# Patient Record
Sex: Male | Born: 1955 | Race: White | Hispanic: Yes | Smoking: Never smoker
Health system: Southern US, Community
[De-identification: ages and names within clinical notes are randomized; demographics above are authoritative.]

## PROBLEM LIST (undated history)

## (undated) DIAGNOSIS — F329 Major depressive disorder, single episode, unspecified: Secondary | ICD-10-CM

## (undated) DIAGNOSIS — K3184 Gastroparesis: Secondary | ICD-10-CM

## (undated) DIAGNOSIS — I739 Peripheral vascular disease, unspecified: Secondary | ICD-10-CM

## (undated) DIAGNOSIS — I10 Essential (primary) hypertension: Secondary | ICD-10-CM

## (undated) DIAGNOSIS — N183 Chronic kidney disease, stage 3 unspecified: Secondary | ICD-10-CM

## (undated) DIAGNOSIS — J45909 Unspecified asthma, uncomplicated: Secondary | ICD-10-CM

## (undated) DIAGNOSIS — H269 Unspecified cataract: Secondary | ICD-10-CM

## (undated) DIAGNOSIS — K219 Gastro-esophageal reflux disease without esophagitis: Secondary | ICD-10-CM

## (undated) DIAGNOSIS — D649 Anemia, unspecified: Secondary | ICD-10-CM

## (undated) DIAGNOSIS — I219 Acute myocardial infarction, unspecified: Secondary | ICD-10-CM

## (undated) DIAGNOSIS — M1990 Unspecified osteoarthritis, unspecified site: Secondary | ICD-10-CM

## (undated) DIAGNOSIS — N400 Enlarged prostate without lower urinary tract symptoms: Secondary | ICD-10-CM

## (undated) DIAGNOSIS — N2889 Other specified disorders of kidney and ureter: Secondary | ICD-10-CM

## (undated) DIAGNOSIS — F419 Anxiety disorder, unspecified: Secondary | ICD-10-CM

## (undated) DIAGNOSIS — G629 Polyneuropathy, unspecified: Secondary | ICD-10-CM

## (undated) DIAGNOSIS — E785 Hyperlipidemia, unspecified: Secondary | ICD-10-CM

## (undated) DIAGNOSIS — G4730 Sleep apnea, unspecified: Secondary | ICD-10-CM

## (undated) DIAGNOSIS — I2510 Atherosclerotic heart disease of native coronary artery without angina pectoris: Secondary | ICD-10-CM

## (undated) DIAGNOSIS — Z86010 Personal history of colonic polyps: Secondary | ICD-10-CM

## (undated) DIAGNOSIS — F32A Depression, unspecified: Secondary | ICD-10-CM

## (undated) DIAGNOSIS — E119 Type 2 diabetes mellitus without complications: Secondary | ICD-10-CM

## (undated) DIAGNOSIS — E1143 Type 2 diabetes mellitus with diabetic autonomic (poly)neuropathy: Secondary | ICD-10-CM

## (undated) HISTORY — DX: Gastroparesis: K31.84

## (undated) HISTORY — DX: Benign prostatic hyperplasia without lower urinary tract symptoms: N40.0

## (undated) HISTORY — DX: Unspecified cataract: H26.9

## (undated) HISTORY — DX: Anxiety disorder, unspecified: F41.9

## (undated) HISTORY — DX: Peripheral vascular disease, unspecified: I73.9

## (undated) HISTORY — DX: Unspecified asthma, uncomplicated: J45.909

## (undated) HISTORY — DX: Acute myocardial infarction, unspecified: I21.9

## (undated) HISTORY — DX: Hyperlipidemia, unspecified: E78.5

## (undated) HISTORY — DX: Polyneuropathy, unspecified: G62.9

## (undated) HISTORY — DX: Type 2 diabetes mellitus with diabetic autonomic (poly)neuropathy: E11.43

## (undated) HISTORY — PX: UPPER GASTROINTESTINAL ENDOSCOPY: SHX188

## (undated) HISTORY — PX: COLONOSCOPY: SHX174

## (undated) HISTORY — DX: Unspecified osteoarthritis, unspecified site: M19.90

## (undated) HISTORY — PX: ANKLE FRACTURE SURGERY: SHX122

## (undated) HISTORY — DX: Personal history of colonic polyps: Z86.010

## (undated) HISTORY — DX: Gastroparesis: E11.43

---

## 1986-03-27 DIAGNOSIS — E119 Type 2 diabetes mellitus without complications: Secondary | ICD-10-CM

## 1986-03-27 HISTORY — DX: Type 2 diabetes mellitus without complications: E11.9

## 1999-10-27 ENCOUNTER — Encounter: Payer: Self-pay | Admitting: Internal Medicine

## 1999-10-27 ENCOUNTER — Ambulatory Visit (HOSPITAL_COMMUNITY): Admission: RE | Admit: 1999-10-27 | Discharge: 1999-10-27 | Payer: Self-pay | Admitting: Internal Medicine

## 2000-09-22 ENCOUNTER — Emergency Department (HOSPITAL_COMMUNITY): Admission: EM | Admit: 2000-09-22 | Discharge: 2000-09-22 | Payer: Self-pay | Admitting: Internal Medicine

## 2000-09-24 ENCOUNTER — Emergency Department (HOSPITAL_COMMUNITY): Admission: EM | Admit: 2000-09-24 | Discharge: 2000-09-24 | Payer: Self-pay | Admitting: Emergency Medicine

## 2000-09-24 ENCOUNTER — Encounter: Payer: Self-pay | Admitting: Emergency Medicine

## 2001-11-12 ENCOUNTER — Encounter (HOSPITAL_COMMUNITY): Admission: RE | Admit: 2001-11-12 | Discharge: 2001-12-12 | Payer: Self-pay | Admitting: Unknown Physician Specialty

## 2001-12-13 ENCOUNTER — Encounter (HOSPITAL_COMMUNITY): Admission: RE | Admit: 2001-12-13 | Discharge: 2002-01-12 | Payer: Self-pay | Admitting: Unknown Physician Specialty

## 2002-01-13 ENCOUNTER — Encounter (HOSPITAL_COMMUNITY): Admission: RE | Admit: 2002-01-13 | Discharge: 2002-02-12 | Payer: Self-pay | Admitting: Unknown Physician Specialty

## 2002-02-12 ENCOUNTER — Encounter (HOSPITAL_COMMUNITY): Admission: RE | Admit: 2002-02-12 | Discharge: 2002-03-14 | Payer: Self-pay | Admitting: Unknown Physician Specialty

## 2002-03-17 ENCOUNTER — Encounter (HOSPITAL_COMMUNITY): Admission: RE | Admit: 2002-03-17 | Discharge: 2002-04-16 | Payer: Self-pay | Admitting: Unknown Physician Specialty

## 2002-05-12 ENCOUNTER — Inpatient Hospital Stay (HOSPITAL_COMMUNITY): Admission: RE | Admit: 2002-05-12 | Discharge: 2002-05-14 | Payer: Self-pay | Admitting: Orthopaedic Surgery

## 2002-06-19 ENCOUNTER — Encounter (HOSPITAL_COMMUNITY): Admission: RE | Admit: 2002-06-19 | Discharge: 2002-07-19 | Payer: Self-pay | Admitting: Orthopaedic Surgery

## 2002-06-26 ENCOUNTER — Emergency Department (HOSPITAL_COMMUNITY): Admission: EM | Admit: 2002-06-26 | Discharge: 2002-06-27 | Payer: Self-pay | Admitting: Emergency Medicine

## 2002-06-26 ENCOUNTER — Encounter: Payer: Self-pay | Admitting: Emergency Medicine

## 2002-07-21 ENCOUNTER — Encounter (HOSPITAL_COMMUNITY): Admission: RE | Admit: 2002-07-21 | Discharge: 2002-08-20 | Payer: Self-pay | Admitting: Orthopaedic Surgery

## 2002-08-22 ENCOUNTER — Encounter (HOSPITAL_COMMUNITY): Admission: RE | Admit: 2002-08-22 | Discharge: 2002-09-21 | Payer: Self-pay | Admitting: Orthopaedic Surgery

## 2002-09-22 ENCOUNTER — Encounter (HOSPITAL_COMMUNITY): Admission: RE | Admit: 2002-09-22 | Discharge: 2002-10-22 | Payer: Self-pay | Admitting: Orthopaedic Surgery

## 2002-10-24 ENCOUNTER — Encounter (HOSPITAL_COMMUNITY): Admission: RE | Admit: 2002-10-24 | Discharge: 2002-11-23 | Payer: Self-pay | Admitting: Internal Medicine

## 2003-01-01 ENCOUNTER — Ambulatory Visit (HOSPITAL_COMMUNITY): Admission: RE | Admit: 2003-01-01 | Discharge: 2003-01-01 | Payer: Self-pay | Admitting: Family Medicine

## 2003-01-01 ENCOUNTER — Inpatient Hospital Stay (HOSPITAL_COMMUNITY): Admission: AD | Admit: 2003-01-01 | Discharge: 2003-01-06 | Payer: Self-pay | Admitting: Family Medicine

## 2003-01-01 ENCOUNTER — Encounter: Payer: Self-pay | Admitting: Family Medicine

## 2003-01-02 ENCOUNTER — Encounter: Payer: Self-pay | Admitting: Family Medicine

## 2003-01-03 ENCOUNTER — Encounter: Payer: Self-pay | Admitting: Family Medicine

## 2003-03-28 HISTORY — PX: ESOPHAGOGASTRODUODENOSCOPY: SHX1529

## 2003-10-09 ENCOUNTER — Emergency Department (HOSPITAL_COMMUNITY): Admission: EM | Admit: 2003-10-09 | Discharge: 2003-10-09 | Payer: Self-pay | Admitting: Emergency Medicine

## 2003-10-14 ENCOUNTER — Emergency Department (HOSPITAL_COMMUNITY): Admission: EM | Admit: 2003-10-14 | Discharge: 2003-10-14 | Payer: Self-pay | Admitting: Emergency Medicine

## 2003-10-15 ENCOUNTER — Emergency Department (HOSPITAL_COMMUNITY): Admission: EM | Admit: 2003-10-15 | Discharge: 2003-10-15 | Payer: Self-pay | Admitting: Emergency Medicine

## 2003-10-16 ENCOUNTER — Inpatient Hospital Stay (HOSPITAL_COMMUNITY): Admission: AD | Admit: 2003-10-16 | Discharge: 2003-10-22 | Payer: Self-pay | Admitting: Gastroenterology

## 2003-10-17 ENCOUNTER — Encounter (INDEPENDENT_AMBULATORY_CARE_PROVIDER_SITE_OTHER): Payer: Self-pay | Admitting: Specialist

## 2003-10-17 ENCOUNTER — Encounter: Payer: Self-pay | Admitting: Internal Medicine

## 2003-10-26 ENCOUNTER — Encounter: Admission: RE | Admit: 2003-10-26 | Discharge: 2003-10-26 | Payer: Self-pay | Admitting: Family Medicine

## 2004-02-20 ENCOUNTER — Emergency Department (HOSPITAL_COMMUNITY): Admission: EM | Admit: 2004-02-20 | Discharge: 2004-02-20 | Payer: Self-pay | Admitting: Emergency Medicine

## 2004-11-17 ENCOUNTER — Emergency Department (HOSPITAL_COMMUNITY): Admission: EM | Admit: 2004-11-17 | Discharge: 2004-11-17 | Payer: Self-pay | Admitting: Emergency Medicine

## 2004-11-22 ENCOUNTER — Emergency Department (HOSPITAL_COMMUNITY): Admission: EM | Admit: 2004-11-22 | Discharge: 2004-11-22 | Payer: Self-pay | Admitting: Emergency Medicine

## 2004-11-23 ENCOUNTER — Ambulatory Visit (HOSPITAL_COMMUNITY): Admission: RE | Admit: 2004-11-23 | Discharge: 2004-11-23 | Payer: Self-pay | Admitting: Emergency Medicine

## 2004-12-16 ENCOUNTER — Emergency Department (HOSPITAL_COMMUNITY): Admission: EM | Admit: 2004-12-16 | Discharge: 2004-12-16 | Payer: Self-pay | Admitting: Emergency Medicine

## 2005-05-18 ENCOUNTER — Ambulatory Visit (HOSPITAL_COMMUNITY): Admission: RE | Admit: 2005-05-18 | Discharge: 2005-05-19 | Payer: Self-pay | Admitting: Ophthalmology

## 2005-10-12 ENCOUNTER — Ambulatory Visit (HOSPITAL_COMMUNITY): Admission: RE | Admit: 2005-10-12 | Discharge: 2005-10-13 | Payer: Self-pay | Admitting: Ophthalmology

## 2006-08-15 ENCOUNTER — Emergency Department (HOSPITAL_COMMUNITY): Admission: EM | Admit: 2006-08-15 | Discharge: 2006-08-15 | Payer: Self-pay | Admitting: Emergency Medicine

## 2006-08-16 ENCOUNTER — Ambulatory Visit (HOSPITAL_COMMUNITY): Admission: RE | Admit: 2006-08-16 | Discharge: 2006-08-16 | Payer: Self-pay | Admitting: Emergency Medicine

## 2006-08-29 ENCOUNTER — Encounter (HOSPITAL_COMMUNITY): Admission: RE | Admit: 2006-08-29 | Discharge: 2006-09-28 | Payer: Self-pay | Admitting: Family Medicine

## 2006-09-18 ENCOUNTER — Ambulatory Visit: Payer: Self-pay | Admitting: Internal Medicine

## 2006-10-04 ENCOUNTER — Ambulatory Visit: Payer: Self-pay | Admitting: *Deleted

## 2006-11-15 ENCOUNTER — Ambulatory Visit: Payer: Self-pay | Admitting: *Deleted

## 2006-11-15 ENCOUNTER — Encounter: Admission: RE | Admit: 2006-11-15 | Discharge: 2006-11-15 | Payer: Self-pay | Admitting: *Deleted

## 2007-01-19 ENCOUNTER — Emergency Department (HOSPITAL_COMMUNITY): Admission: EM | Admit: 2007-01-19 | Discharge: 2007-01-19 | Payer: Self-pay | Admitting: Emergency Medicine

## 2007-07-27 DIAGNOSIS — M129 Arthropathy, unspecified: Secondary | ICD-10-CM | POA: Insufficient documentation

## 2007-07-27 DIAGNOSIS — E1159 Type 2 diabetes mellitus with other circulatory complications: Secondary | ICD-10-CM | POA: Insufficient documentation

## 2007-07-27 DIAGNOSIS — E108 Type 1 diabetes mellitus with unspecified complications: Secondary | ICD-10-CM | POA: Insufficient documentation

## 2007-07-27 DIAGNOSIS — Z87442 Personal history of urinary calculi: Secondary | ICD-10-CM

## 2007-07-27 DIAGNOSIS — K219 Gastro-esophageal reflux disease without esophagitis: Secondary | ICD-10-CM

## 2007-07-27 DIAGNOSIS — I10 Essential (primary) hypertension: Secondary | ICD-10-CM

## 2007-11-02 ENCOUNTER — Inpatient Hospital Stay (HOSPITAL_COMMUNITY): Admission: EM | Admit: 2007-11-02 | Discharge: 2007-11-04 | Payer: Self-pay | Admitting: Emergency Medicine

## 2007-11-03 ENCOUNTER — Ambulatory Visit: Payer: Self-pay | Admitting: Surgery

## 2007-11-04 ENCOUNTER — Encounter (INDEPENDENT_AMBULATORY_CARE_PROVIDER_SITE_OTHER): Payer: Self-pay | Admitting: Internal Medicine

## 2007-12-03 ENCOUNTER — Encounter (HOSPITAL_COMMUNITY): Admission: RE | Admit: 2007-12-03 | Discharge: 2007-12-25 | Payer: Self-pay | Admitting: Neurosurgery

## 2007-12-26 ENCOUNTER — Ambulatory Visit: Admission: RE | Admit: 2007-12-26 | Discharge: 2007-12-26 | Payer: Self-pay | Admitting: Neurosurgery

## 2008-03-27 DIAGNOSIS — G4730 Sleep apnea, unspecified: Secondary | ICD-10-CM

## 2008-03-27 HISTORY — DX: Sleep apnea, unspecified: G47.30

## 2010-08-09 NOTE — Consult Note (Signed)
VASCULAR SURGERY CONSULTATION   Joseph [name], Joseph [name]  DOB:  November 05, 1955                                       10/04/2006  ZHYQM#:[id number]   CHIEF COMPLAINT:  Bilateral leg pain.   HISTORY:  Joseph [name] is a 55 year old, type 2 diabetic, who presents  complaining of bilateral leg pain.  This is more severe in his right  leg.  It occurs with ambulation present in his right ankle and calf and  to a lesser extent left calf.  It occurs after walking 1-2 blocks.  No  pain at rest.   He does have a history of complex right ankle fracture with chronic  pain, required open reduction of this approximately 5 years ago.  This  was a work related injury.   PAST MEDICAL HISTORY:  1. Type 2 diabetes.  2. Hypertension.  3. Gastroesophageal reflux disease.  4. Depression.   MEDICATIONS:  1. Glipizide 10 mg b.i.d.  2. Toprol XL 100 mg daily.  3. Cyclobenzaprine 10 mg b.i.d. t.i.d. p.r.n.  4. Promethazine 25 mg q.8 h.  5. Benicar HCT 40/25, one tablet daily.  6. Mirtazapine 15 mg, three tablets at bedtime.  7. Amlodipine.  8. Benazepril 5/20, one tablet daily.  9. Keflex 500 mg b.i.d.  10.Metformin HCL 1,000 mg b.i.d.  11.Aspirin 81 mg daily.  12.Protonix 40 mg b.i.d.  13.Metoclopramide 10 mg q.i.d.   ALLERGIES:  None known.   FAMILY HISTORY:  This is significant for diabetes in his father and  brother and sister.   SOCIAL HISTORY:  The patient is widowed with 4 children.  He does not  currently use alcohol or tobacco.   REVIEW OF SYSTEMS:  He notes weight loss and some anorexia.  He has a  chronic cough.  Chronic abdominal pain.  Chronic constipation.  Occasional dizziness and headaches.  Arthritic joint discomfort.  Depression and nervousness.   PHYSICAL EXAMINATION:  GENERAL:  A 55 year old Hispanic male who is in  no acute distress, moderate obesity.  VITAL SIGNS:  BP 133/89, pulse 82 per minute, respirations 18 per  minute.  NECK:  Supple.  No thyromegaly or  adenopathy.  CHEST:  Clear without rales or rhonchi.  CARDIOVASCULAR:  No carotid bruits.  Normal heart sounds without  murmurs.  ABDOMEN:  Soft and nontender.  No masses, organomegaly.  Normal bowel  sounds.  LOWER EXTREMITIES:  Femoral pulses 2+ bilaterally.  Popliteal pulses 1+  bilaterally.  Left dorsalis pedis 1+, absent right dorsalis pedis.  Absent right posterior tibial.  And absent left posterior tibial pulse.   INVESTIGATIONS:  Lower extremity arterial Doppler evaluation carried out  at [facility] reveals no evidence of hemodynamic significant  lower extremity arterial occlusive disease at rest.  However, vascular  calcification and noncompressive quality of the tibial vessels was  noted.   RECOMMENDATIONS:  The patient is referred for a CT angiogram of the  abdominal aorta with bilateral lower extremity runoff arteriography and  return to the office with results for further management.   Balinda Quails, M.D.  Electronically Signed  PGH/MEDQ  D:  10/04/2006  T:  10/05/2006  Job:  [id number]

## 2010-08-09 NOTE — Letter (Signed)
November 15, 2006   Tammy R. Collins Scotland, M.D.  [address], [place] [address]   Re:  KUTTER, SCHNEPF I              DOB:  1955-05-01   Dear Dr. Collins Scotland:   Mr. Rachel has undergone a CT angiogram of the abdomen with bilateral  lower extremity arteriography.  This fails to reveal any major arterial  occlusive disease.   I note in his initial consultation he did have normal lower extremity  Dopplers with triphasic wave forms throughout the lower extremities  bilaterally.   I have informed Mr. Puthoff of these findings.  I do not feel that he  requires any further workup for peripheral vascular disease at this  time.  I am sure that he does have some degree of tibial disease  associated with his diabetes.  He needs to remain off tobacco, and  continue medical management per Dr. Alda Berthold supervision.   Balinda Quails, M.D.  Electronically Signed   PGH/MEDQ  D:  11/15/2006  T:  11/17/2006  Job:  [id number]

## 2010-08-09 NOTE — Assessment & Plan Note (Signed)
Joseph [name]                         GASTROENTEROLOGY OFFICE NOTE   Joseph [name], Joseph [name]                       MRN:          [id number]  DATE:09/18/2006                            DOB:          1956/01/17    REFERRING PHYSICIAN:  Tammy R. Collins Scotland, M.D.   REASON FOR CONSULTATION:  Nausea and vomiting.   HISTORY:  This is a 55 year old Hispanic male, a history of  hypertension, diabetes mellitus and reflux disease, who was added onto  today's schedule, at the request of Dr. Alda Berthold office, regarding  problems with vomiting and weight loss.  The patient is a poor historian  and seems to have poor insight into his medical conditions.  He has been  seen in this office on one limited occasion.  That encounter occurred in  July of 2001, when he was referred for hematemesis.  At that time, upper  endoscopy was performed and found to be entirely normal.  He was felt to  possibly have reflux disease and was placed on a proton pump inhibitor.  He was seen one other time in July of 2005 as a hospital inpatient, when  he was admitted for nausea and vomiting, felt secondary to  gastroparesis.  Upper endoscopy was again performed and returned normal.  He was to follow up in the office but failed to do so, despite being  given an appointment.  He has not been seen since.  The patient's  diabetes has been under poor control.  His last hemoglobin A1c was 8.3.  He does have classic reflux symptoms including heartburn, regurgitation  and digestion.  Nausea and vomiting seem worse when the symptoms are  present.  He has had difficulty getting his medications at times, due to  no health insurance.  He has not worked.  He has recently secured  Medicaid, which has helped him afford his medications.  He also  complains of intermittent abdominal  discomfort, bloating and  constipation, which has not been worse.  When he is sick on his stomach,  he cannot eat and will lose  weight, though when feeling better is able  to regain his weight.  Since the patient has been able to secure  Medicaid, it appears that the patient has been on a daily proton pump  inhibitor, though he can not recall the name.  It also appears that he  has been on Reglan.He has felt some better since starting the GI  medications.   PAST MEDICAL HISTORY:  As above.   PAST SURGICAL HISTORY:  None.   CURRENT MEDICATIONS:  It is not certain, but we think the patient is on  Benicar 40 mg daily, Metformin 500 mg b.i.d., aspirin 81 mg daily,  Altace 10 mg daily, Toprol XL 100 mg daily, Accupril 10 mg daily,  Gabapentin 300 mg as directed, Glipizide 10 mg b.i.d., Promethazine 25  mg p.r.n., Remeron 30 mg at night and Reglan p.r.n.  Also, possible use  of Lantus insulin 20 units at night.   ALLERGIES:  NO KNOWN DRUG ALLERGIES.   FAMILY HISTORY:  Negative for gastrointestinal malignancy.  SOCIAL HISTORY:  The patient is widowed with 4 children.  He lives with  a friend.  He is unemployed.  He does not smoke or use alcohol.   REVIEW OF SYSTEMS:  Per diagnostic evaluation form.   PHYSICAL EXAMINATION:  GENERAL:  Well-appearing male in no acute  distress.  VITAL SIGNS:  Blood pressure is 138/82.  Heart rate is 88, weight is  177.8 pounds.  He is 5 foot 7 inches in height.  HEENT:  Sclerae anicteric.  Conjunctivae are pink.  Oral mucosa is  intact.  There is no oral thrush.  LUNGS:  Clear.  HEART:  Regular.  ABDOMEN:  Soft without tenderness, mass or hernia.  No succussion  splash.  Good bowel sounds heard.  EXTREMITIES:  Without edema.   IMPRESSION:  1. Recurrent problems with nausea and vomiting, due to a combination      of gastroesophageal reflux disease, poorly controlled diabetes      mellitus, and probable diabetic gastroparesis.  2. Multiple general medical problems with poor insight into his      medical issues. As well, his suboptimal involvement with his      medical  care.   RECOMMENDATIONS:  1. Daily proton pump inhibitor therapy. BID if once daily not      effective.  2. Strict control of diabetes mellitus.He will work with Dr. Collins Scotland.  3. Reglan 10 mg a.c. and h.s. if nausea and vomiting persists despite      compliance with the first two recommendations above.  4. Resume general medical care with Dr. Collins Scotland.     Wilhemina Bonito. Marina Goodell, MD  Electronically Signed    JNP/MedQ  DD: 09/20/2006  DT: 09/20/2006  Job #: [id number]   cc:   Tammy R. Collins Scotland, M.D.

## 2010-08-09 NOTE — Discharge Summary (Signed)
NAMEPRADYUN, Joseph [name]             ACCOUNT NO.:  [id number]   MEDICAL RECORD NO.:  [id number]          PATIENT TYPE:  INP   LOCATION:  4738                         FACILITY:  [facility]   PHYSICIAN:  Joseph I Elsaid, MD      DATE OF BIRTH:  08-Jun-1955   DATE OF ADMISSION:  11/02/2007  DATE OF DISCHARGE:  11/04/2007                               DISCHARGE SUMMARY   PRIMARY CARE PHYSICIAN:  Tammy R. Collins Scotland, M.D.   DISCHARGE DIAGNOSES:  1. Alcohol intoxication.  2. Syncope/seizure secondary to alcohol intoxication.  3. One episode of vomiting bloody fluid felt to be secondary to      alcoholic gastritis versus Mallory-Weiss tear.  4. Insulin-dependent diabetes mellitus.  5. Hypertension.  6. Gastroparesis.  7. Depression.  8. History of fatty liver.  9. The patient denies history of heavy alcohol abuse.  10.Hyponatremia secondary to alcohol, resolved.  11.History of anxiety and panic attack, resolved.  12.Acute renal failure, resolved.   DISCHARGE MEDICATIONS:  1. Insulin Lantus 35 units subcu at bedtime.  2. Insulin Humalog 70/30, 20 units subcu twice daily.  3. Benicar 1 tab daily.  4. To continue his dose of Lexapro and Klonopin.  5. Aspirin 81 mg daily.  6. Reglan 10 mg p.o. q.6 h. p.r.n.   CONSULTATION:  None.   PROCEDURE:  1. CT head, no acute intracranial abnormality and focal left frontal      cortical calcification, which was felt to be chronic.  2. Chest x-ray, cardiomegaly and central pulmonary vascular      prominence.   HISTORY OF PRESENT ILLNESS:  1. Please review the history done by Dr. Christella Noa.  This is a 55-year-      old male who was drinking heavily at a bar when he suddenly      collapsed.  As per his daughter, his eyes rolled back, and he      started twitching his lower and upper limb.  No incontinence or lip      biting noted.  The patient was somnolent and poorly arousable at      the emergency room and also he was vomiting and they noticed few  blood on the vomitus.  The patient admitted to the ICU for close      observation.  He was started on IV fluid within 24-hour complete      correction of his sodium level was done.  The patient maintained      his mentation, and the patient, whatever he knew, he was drinking      heavy alcohol at that time.  CT head was negative for any acute      event.  We felt that acute syncopal/seizure is related to alcohol,      and we did not press any further EEG.  The patient was advised if      he had recurrence of this episode, to return to the hospital or      contact his MD.  The patient was kept under monitor for 48 hours      with no further return of the  episode.  The patient retained his      complete conscious.  The patient was advised about quitting alcohol      and he denies heavy alcohol drinking.  He drinks occasionally and      heavily once every year.  2. Episode of vomiting of blood.  The patient's H&H remained stable      and was felt secondary to alcoholic gastritis versus Mallory-Weiss      tear.  No further episode happened and the patient tolerated      regular diet.  3. Uncontrolled diabetes mellitus with hemoglobin C1 found to be 11.3.      The patient to resume his home medication and to follow with his      primary care physician to adjust his medication further.  4. Hypertension.  The patient to continue his home medication.  5. Acute renal insufficiency, which improved.   DISPOSITION:  The patient will be discharged home.  Follow with his  primary care physician within a week.      Joseph Bosie Helper, MD  Electronically Signed     HIE/MEDQ  D:  11/04/2007  T:  11/04/2007  Job:  [id number]   cc:   Tammy R. Collins Scotland, M.D.

## 2010-08-09 NOTE — H&P (Signed)
Joseph [name], Joseph [name] NO.:  [id number]   MEDICAL RECORD NO.:  [id number]          PATIENT TYPE:  INP   LOCATION:  4738                         FACILITY:  [facility]   PHYSICIAN:  Herbie Saxon, MDDATE OF BIRTH:  November 19, 1955   DATE OF ADMISSION:  11/02/2007  DATE OF DISCHARGE:                              HISTORY & PHYSICAL   PRIMARY CARE PHYSICIAN:  Tammy R. Collins Scotland, M.D.   GASTROENTEROLOGIST:  Wilhemina Bonito. Marina Goodell, MD   PRESENTING COMPLAINT:  Passing out 10-15 minutes today, limb twitching  30 minutes.   HISTORY OF PRESENTING COMPLAINT:  This 55 year old Hispanic male was  drinking heavily at a party when it was suddenly noticed that he passed  out by the daughter with his eyes rolled backwards.  Subsequently, the  patient started having involuntary limb twitching lower and upper limbs.  No urine incontinence or lip biting noted.  EMS was subsequently called.  The patient was transferred to the emergency room at [facility].  The patient is somnolent, poorly arousable, noticed to be  still having some intermittent limb twitching and what seems like panic  attack, insisted on being taken home initially, but wanted to be taken  home by the daughters, extremely somnolent, but the daughters  volunteering further information.  The patient was previously healthy  prior to going to this party.  The patient has been noticed to be  vomiting bloody fluid while he was in the emergency room.  He was not  having this previously, but no melena noticed.   PAST MEDICAL HISTORY:  Insulin-dependant diabetes mellitus,  hypertension, gastroparesis, degenerative disk disease, heavy alcohol  abuse, depression, ureterolithiasis, fatty liver.   PAST SURGICAL HISTORY:  Fractured left leg status post ORIF.   FAMILY HISTORY:  Father and brother had diabetes.   SOCIAL HISTORY:  He is widowed, lives with his family, has 4 children,  history of binge alcohol drinking.  No history  of tobacco or drug abuse.   REVIEW OF SYSTEMS:  Not available presently because the patient is  obtunded.   ALLERGIES:  No known drug allergies.   MEDICATIONS:  1. Lantus insulin 35 units q.h.s.  2. Humalog coverage.  3. Benicar 1 tablet daily.  4. Percocet 1 tablet q.6 h. p.r.n. alternating with Vicodin.   PHYSICAL EXAMINATION:  GENERAL:  He is a middle-aged man, obese, not in  acute respiratory distress.  VITAL SIGNS:  Temperature 98.4, pulse 66, respiratory rate is 24, and  blood pressure 142/82.  The patient is not pale or jaundiced, very  somnolent, he is moving all limbs and responds to noxious stimuli.  Mucous membranes are moist.  Oropharynx and nasopharynx are clear.  NECK:  Supple.  No adenopathy or elevated JVD.  HEART:  Heart sounds 1 and 2, regular rate and rhythm.  No murmurs,  gallops, or rubs.  CHEST:  Clinically clear.  ABDOMEN:  Soft and nontender.  No organomegaly.  Inguinal orifices are  patent.  NEUROLOGIC:  The patient is arousable.  No seizure activity presently.  Cranial nerves and sensory system could not be tested.  Babinski  is  downgoing bilaterally.  Peripheral pulses present.  No pedal edema.   LABS:  WBC is 7, hematocrit 39, and platelet count is 295.  Urinalysis  negative for wbc, rare bacteria.  INR is 1.0, AST 24.  ETOH is 221.  Lipase 53.  Urine tox negative.  EKG showed no acute changes.  Chest x-  ray shows no acute changes.  Chemistry, sodium is 126, potassium 3.2,  chloride 89, bicarbonate 21, BUN 20, creatinine 1.52, and glucose is  277.   ASSESSMENT:  1. Altered mental status.  2. Gastrointestinal bleed.  3. Alcoholic intoxication.  4. Syncope versus seizure.  5. Hyponatremia.  6. Insulin-dependent diabetes, by labs is uncontrolled.  7. Degenerative joint disease.  8. Anxiety and panic attacks.  9. Mild acute renal failure.   The patient is to be admitted into Intensive Care Unit.  We will keep  him n.p.o. for now.  IV fluid  normal saline at 120 mL an hour.  We will  obtain his urine, sodium level, ammonia level, serum uric acid, serum  osmolality.  Check his hemoccult x3.  EEG to be scheduled in the  morning.  H&H q.12 h.  A 2-D echocardiogram and carotid Doppler.  Cardiac enzymes and EKG q.8.h x3.  We will check his calcium, magnesium,  and phosphate level.  When patient is more alert, counsel on alcoholic  abuse and Behavioral Health will be involved. .  Consult GI, neurology,  and psych evaluation in the morning.  We will check his BMP q.4 h. in  the next eight hours.  Type and cross 4 units of packed red blood cells  and transfuse 2 units of packed red blood cells with 40 mg IV Lasix, if  hematocrit drops below 30.  Ativan IV withdrawal protocol, Lopressor 2.5  mg IV q.6 h. p.r.n., SCD boots for DVT prophylaxis.  Protonix 40 mg IV  q.12 h. Phenergan 12.5 mg IV q.8 h.  Accu-Cheks q.4 h. and sensitive  scale NovoLog sliding scale coverage.  Activity, bed rest, fall,  aspiration, seizure, delirium tremens precautions fully characterizing  for once input and output monitoring.  O2 2 to 4 liters via nasal  cannula supplementally to keep saturations greater than 90.  Add  thiamine and folate in the first liter of IV fluid.      Herbie Saxon, MD  Electronically Signed     MIO/MEDQ  D:  11/02/2007  T:  11/04/2007  Job:  [id number]   cc:   Tammy R. Collins Scotland, M.D.

## 2010-08-12 NOTE — Op Note (Signed)
Joseph [name], Joseph [name]              ACCOUNT NO.:  [id number]   MEDICAL RECORD NO.:  [id number]          PATIENT TYPE:  OIB   LOCATION:  5735                         FACILITY:  [facility]   PHYSICIAN:  John D. Ashley Royalty, M.D. DATE OF BIRTH:  Oct 17, 1955   DATE OF PROCEDURE:  05/18/2005  DATE OF DISCHARGE:                                 OPERATIVE REPORT   ADMISSION DIAGNOSIS:  Proliferative diabetic retinopathy, posterior vitreous  membranes, traction detachment, vitreous hemorrhage, left eye.   PROCEDURES:  Pars plana vitrectomy with 25-gauge system, panretinal  photocoagulation, membrane peel, left eye.   SURGEON:  Alan Mulder, MD   ASSISTANT:  Rosalie Doctor, MA   ANESTHESIA:  General.   DETAILS:  Usual prep and drape, 25-gauge trocars at 10, 2 and 4 o'clock, 25-  gauge infusion at 4 o'clock, Provisc placed on the corneal surface.  The  pars plana vitrectomy was begun just behind the cataractous lens.  A large  curtain of red blood was encountered; this was carefully removed under low  suction and rapid cutting.  The vitreous was stretched down to a traction  area nasal to the disk, as well as 2 traction areas superior and temporal to  the macula.  These areas were trimmed and peeled with lighted pick and  removed with the vitreous cutter.  Blood was vacuumed from the macular  surface.  The vitrectomy was carried into the far periphery, where scleral  depression was used to remove all vitreous and blood down to the vitreous  base.  Once this was accomplished, the endolaser was positioned in the eye;  974 burns placed around the retinal periphery.  The power was 1000  milliwatts, 1000 microns each and 0.1 seconds each.  A washout and rinsing  of the vitreous cavity was then performed.  Once all blood was removed, the  instruments were removed from the eye.  The 25 trocars were removed.  The  wounds were held until tight.  0.1 mL of sterile air was injected to  reinflate the globe.   Closing tension was 10 with a Barraquer keratometer.  Polymyxin and gentamicin were irrigated into Tenon's space.  Atropine  solution was applied.  Marcaine was injected around the globe for postop  pain.  Decadron 10 mg were given to the subconjunctival space.  TobraDex  ophthalmic ointment, a patch and shield were placed.  Closing tension was 10  with a Barraquer keratometer.  Complications -- none.  Duration -- 1/2 hour.      Beulah Gandy. Ashley Royalty, M.D.  Electronically Signed     JDM/MEDQ  D:  05/18/2005  T:  05/19/2005  Job:  [id number]

## 2010-08-12 NOTE — Op Note (Signed)
   [name], Joseph [name]                          ACCOUNT NO.:  [id number]   MEDICAL RECORD NO.:  [id number]                  PATIENT TYPE:   LOCATION:  341                                  FACILITY:   PHYSICIAN:  Ky Barban, M.D.            DATE OF BIRTH:   DATE OF PROCEDURE:  DATE OF DISCHARGE:                                 OPERATIVE REPORT   PREOPERATIVE DIAGNOSES:  Right flank pain, gross hematuria, filling defect  right ureter.   POSTOPERATIVE DIAGNOSES:  No filling defect, basically medium and right  hydroureter with normal looking retrograde pyelogram essentially.   PROCEDURE:  Cystoscopy, right retrograde pyelogram. Insertion of double-J  stent, size 5 French 24 cm.   SURGEON:  Ky Barban, M.D.   ANESTHESIA:  Spinal   DESCRIPTION OF PROCEDURE:  The patient is placed in the lithotomy position  after routine prep and drape. A #25 cystoscope introduced into the bladder.  The bladder is inspected; it looks normal.  Right ureteral orifice  catheterized with a wedge catheter.  Hypaque is injected under fluoroscopic  control.  The dye goes up into the upper ureter.  There is a slightly  narrowing area on the ureter at the L3 level, but the dye goes through It.  The ureter is minimal dilated below and above this down to the  ureterovesical junction, but I do not see any filling defect. The renal  pelvis looks normal with normal calyces well cupped.   The ureter drains out nicely on drainage fill and basically it is normal;  but I decided to leave a double-J stent because he has had a lot of pain for  several days. A guidewire is passed up into the renal pelvis.  Over the  guidewire a 5 [place], 24 cm, double-J stent is positioned between the renal  pelvis and the bladder.   The double-J stent loops nicely in the bladder but in the renal pelvis there  is not a complete loop, because the upper tip is in the upper calyx; but, I  am sure, that I am perfectly  satisfied with it.  All the instruments were  removed.  The patient left the operating room in satisfactory condition.      ___________________________________________                                            Ky Barban, M.D.   MIJ/MEDQ  D:  01/03/2003  T:  01/03/2003  Job:  [id number]   cc:   Jeoffrey Massed, M.D.  [facility]  [place], [place] [address]  Fax: [phone]

## 2010-08-12 NOTE — Op Note (Signed)
NAMECORDON, GASSETT                          ACCOUNT NO.:  [id number]   MEDICAL RECORD NO.:  [id number]                   PATIENT TYPE:  INP   LOCATION:  5007                                 FACILITY:  [facility]   PHYSICIAN:  Mark C. Ophelia Charter, M.D.                 DATE OF BIRTH:  December 03, 1955   DATE OF PROCEDURE:  05/12/2002  DATE OF DISCHARGE:                                 OPERATIVE REPORT   PREOPERATIVE DIAGNOSIS:  Right ankle post-traumatic arthrosis.   POSTOPERATIVE DIAGNOSIS:  Right ankle post-traumatic arthrosis.   PROCEDURE:  Right distal fibular resection and ankle fusion with screw  fixation.   TOURNIQUET TIME:  An hour and half x350.   SURGEON:  Mark C. Ophelia Charter, M.D.   ASSISTANT:  Genene Churn. Denton Meek.   DRAINS:  None.   BRIEF HISTORY:  This 55 year old male had a medial malleolar fracture with a  probable syndesmotic rupture, had operative fixation done at an outside  hospital by another surgeon with open reduction and internal fixation of the  medial malleolus with x-rays demonstrating lateral position of the distal  fragment and lateral position of the talus.  He underwent removal of  hardware and excision of the fragment, but has had progressive arthrosis  present on plain radiographs with lateral subluxation of the fibula and  degenerative changes.   DESCRIPTION OF PROCEDURE:  After induction of general anesthesia,  preoperative Ancef prophylaxis, an IV bag wrapped with a towel placed  underneath the left buttocks, proximal thigh tourniquet, prepping with  DuraPrep, the usual extremity sheets/drapes were applied.  Stockinette was  cut and Betadine ViDrape was applied after marking the skin.  ViDrape sealed  the skin.  Large #9 glove was placed over the toes prior to application of  the ViDrape to isolate the toes.  Incision was made laterally following the  fibula starting 10 cm from its tip, and then at the distal aspect of the  fibula curving slightly anterior.   Subperiosteal dissection of the fibula  was performed, and the fibula was cut 6-8 cm proximal to the tip in oblique  fashion.  Syndesmotic ligaments were cut with a scalpel blade peeling from  the anterior and posterior aspect with care taken to not damage the peroneal  tendons or sural nerve.  Distal joint of the fibula and talus was  visualized.  Ankle joint with fibula was visualized.  Subperiosteal  dissection along the anterior distal surface of the tibia was performed, and  the capsule was elevated, and a Crego retractor was used to protect the  neurovascular bundle.  Posteriorly, this was elevated as well, and the wide  curved Crego was placed.  With structures protected anterior and posterior,  the ankle joint was placed in neutral position, and a saw blade was used to  remove the distal 2-3 mm of articular cartilage, making a direct transverse  cut across the  tibia, checking the saw cut under fluoroscopy to make that it  was perpendicular to the long axis of the tibia.  Medial malleolus fragment  had been excised, and using a laminar spreader, after the distal tibia piece  was removed, the medial malleolus remnant was still in place.  Ankle was  placed in neutral position, care taken not to invert or evert, and a cut was  made over the dome of the talus perpendicular to the long axis of the tibia  and parallel to the cut on the tibia.  This was checked under fluoroscopy  with slight varus, and the cut was adjusted so that it was exactly straight  with the tibial cut.  Pieces of bone were removed which was a 2-mm piece  removing all the articular cartilage.  Articular cartilage showed grade 4  chondromalacia with exposed subchondral bone.  Laminar spreader was  inserted, and the cartilage on the small remaining remnant of the medial  malleolus was scraped off the internal surface.  Under fluoroscopy, the  ankle was positioned in neutral position.  Small stab was placed medially   over the tibia spreading with a Mosquito.  A 0.032 drill was used followed  by placement of a screw which was 90 mm in length.  This held the ankle  tight; however, on a direct lateral x-ray, the screw was positioned slightly  posterior, and it entered and touched the subtalar joint.  This was pulled  out and without redrilling, an 80-mm screw was used and was positioned more  anterior, went across the ankle joint, did not violate the subtalar joint,  and held the ankle secure.  A second screw was the placed from the lateral  incision starting at the flare of the metaphysis and drilling obliquely  across the ankle joint into the talus.  This was a 40-mm screw, held the  ankle tight, secure, and there was no motion at the ankle joint.  It was  checked under AP and lateral fluoroscopy, and permanent photographs were  taken showing good position across the ankle joint, but not violating the  subtalar joint.  Fibula had been cut up into small tiny pieces, was packed  posteriorly, a few tiny piece anterior.  Excessive bone graft anterior would  have caused neurovascular compromise, and minimal bone was placed anterior.  The bone was packed lateral, and soft tissue was reapproximated with 2-0  Vicryl.  Peroneal nerves were in good position.  Skin was infiltrated with  Marcaine and closed with skin staples.  A large compressive dressing was  applied with Adaptic, 4 x 4's, Webril, Kling, giant cotton rolls, for a  compressive short leg splint, followed by Coban.  Instrument and needle  count was correct.  Tourniquet was deflated, small bleeders controlled with  the Bovie electrocautery prior to closure.  Tourniquet time was 1 hour and  30 minutes, and the patient was transferred to the recovery room in stable  condition.                                               Mark C. Ophelia Charter, M.D.    MCY/MEDQ  D:  05/12/2002  T:  05/12/2002  Job:  [id number]

## 2010-08-12 NOTE — H&P (Signed)
NAMEALBARAA, SWINGLE                          ACCOUNT NO.:  [id number]   MEDICAL RECORD NO.:  [id number]                   PATIENT TYPE:  INP   LOCATION:  5731                                 FACILITY:  [facility]   PHYSICIAN:  Malcolm T. Russella Dar, M.D. [facility]          DATE OF BIRTH:  01/18/1956   DATE OF ADMISSION:  10/16/2003  DATE OF DISCHARGE:                                HISTORY & PHYSICAL   CHIEF COMPLAINT:  Nausea and vomiting x1 month, cannot keep anything down.   HISTORY:  Vue is a 55 year old Hispanic male known to Dr. Suella Grove.  Moore's office with history of insulin-dependent diabetes mellitus,  hypertension, ureterolithiasis and depression.  He apparently has been  having some intermittent nausea and vomiting for a couple of years and has a  questionable prior diagnosis of gastroparesis.  He apparently had been seen  by Dr. Lionel December 4-5 years ago and had been tried on a trial of Reglan  which it is not clear whether or not it was beneficial.  We do not have any  of those records at the time of this dictation.  At this time, he has had  progressive symptoms over the past 1 month to the point of daily vomiting  and says that he is constantly nauseated, denies any heartburn or  indigestion.  He has been having upper abdominal pain and aching across the  upper abdomen.  He has not had any documented fever or chills; no diarrhea,  melena or hematochezia.  Over the past several days, he has been unable to  keep down anything including his medications and water.  He had been in the  emergency room on October 09, 2003 and again on October 15, 2003 and then left  [facility] ER yesterday and went to [facility] ER because he had not been seen by a  physician.  He was given IV fluids, antiemetics, etc, and then discharged  home.  He has had abdominal ultrasound this month which was normal with the  exception of fatty liver, CT scan of the abdomen and pelvis was  unremarkable, liver function studies  have been normal, albumin of 4,  hemoglobin of approximately 12, CBGs in the 250 range.  At this time, he is  unable to keep down even his medications over the past few days, was added  on to be seen in our office today and is admitted now for hydration, IV  antiemetics, IV Reglan, PPI and upper endoscopy.   Question refractory gastroparesis or possible gastric outlet obstruction.   CURRENT MEDICATIONS:  1. Benicar 40 mg daily.  2. Actos 45 mg daily.  3. Metformin 500 mg b.i.d.  4. Enteric-coated aspirin 81 mg daily.  5. Altace 5 mg 2 p.o. daily.  6. Nexium 40 mg daily.  7. Hydrochlorothiazide 25 mg daily.  8. Glyburide 5 mg b.i.d.  9. Zoloft 100 mg 1-1/2 tablets daily.  10.  Lantus insulin, which he is not currently taking.   ALLERGIES:  No known drug allergies.   PAST HISTORY:  Past history is pertinent for:  1. Hypertension.  2. Diabetes x15 years.  3. Ureterolithiasis.   PAST SURGICAL HISTORY:  He has not had any prior abdominal surgery.   FAMILY HISTORY:  The patient does have a family history of diabetes, 1  sister with a history of cancer, uncertain type.   SOCIAL HISTORY:  No tobacco.  No ETOH.  Not currently employed, living with  his girlfriend.   REVIEW OF SYSTEMS:  Review of systems pertinent for fatigue, some recent  dizziness over the past  few days, difficulty sleeping and depression.   PHYSICAL EXAMINATION:  GENERAL:  Well-developed Hispanic male in no acute  distress.  He is ill-appearing.  VITAL SIGNS:  Blood pressure 150/90, pulse is 86.  Weight is 182.  HEENT:  Nontraumatic, normocephalic.  EOMI.  PERLA.  Sclerae anicteric.  Buccal mucosa is dry.  CARDIOVASCULAR:  Regular rate and rhythm with S1 and S2.  He is slightly  tachycardic.  PULMONARY:  Clear to A&P.  ABDOMEN:  Abdomen is soft.  Bowel sounds are active.  He is tender across  the epigastrium.  There is no mass or hepatosplenomegaly.  RECTAL:  Heme-negative.  EXTREMITIES:  Extremities  without clubbing, cyanosis, or edema.   IMPRESSION:  1. [age]-year-old male with 43-month history of nausea and vomiting     which have been progressive and refractory, unable to keep down orals x4     days, rule out gastroparesis, rule out peptic ulcer disease with partial     gastric outlet obstruction.  2. Insulin-dependent diabetes mellitus.  3. Hypertension.  4. History of ureterolithiasis.  5. Depression.   PLAN:  The patient is admitted to the service of Dr. Judie Petit T. Russella Dar for IV  fluid hydration, baseline labs, antiemetics.  He will be started on IV  Reglan, IV Protonix, sliding-scale insulin coverage and plan EGD in a.m.  For details, please see the orders.      Amy Esterwood, P.A.-C. [facility]                Malcolm T. Russella Dar, M.D. [facility]    AE/MEDQ  D:  10/16/2003  T:  10/17/2003  Job:  [id number]   cc:   Ernestina Penna, M.D.  [address] [place]  [place] [address]  Fax: [phone]

## 2010-08-12 NOTE — H&P (Signed)
Joseph [name], Joseph [name]                          ACCOUNT NO.:  [id number]   MEDICAL RECORD NO.:  [id number]                   PATIENT TYPE:  INP   LOCATION:  A341                                 FACILITY:  [facility]   PHYSICIAN:  Jeoffrey Massed, M.D.             DATE OF BIRTH:  August 26, 1955   DATE OF ADMISSION:  01/01/2003  DATE OF DISCHARGE:                                HISTORY & PHYSICAL   CHIEF COMPLAINT:  Flank pain.   HISTORY OF PRESENT ILLNESS:  A 55 year old Hispanic male who presented to  the clinic today with about a 12- to 16-hour history of severe right flank  pain and gross hematuria.  He was feeling in his normal state of health  yesterday at work, and then when he got home from work he began to have the  pain.  He awoke again this morning with the pain and has been having nausea,  vomiting/retching all morning and in the office today.  He has been having  chills but no temperature taken, and it was normal in the office today.  No  diarrhea.  No abdominal pain.  No chest pain.  No shortness of breath.  No  prior history of flank pain similar to this.  No prior history of hematuria.   PAST MEDICAL HISTORY:  1. Diabetes type 2, noninsulin-dependent.  2. Hypertension.  3. Gastroesophageal reflux disease.  4. Normocytic anemia.  5. History of chronic abdominal pain (normal EGD, small bowel follow-     through, and barium enema in 2001).  6. Intermittent nausea, vomiting illnesses; believed to be diabetic     gastroparesis.   PAST SURGICAL HISTORY:  Open reduction and internal fixation of the right  ankle after an accident at work in 2002.   MEDICATIONS:  1. Actos 45 mg daily.  2. Benicar 40 mg daily.  3. Glucophage 500 mg p.o. b.i.d.  4. Glyburide 10 mg p.o. b.i.d.  5. Hydrochlorothiazide 25 mg p.o. q.i.d.  6. Aspirin 81 mg daily.   ALLERGIES:  No known drug allergies.   SOCIAL HISTORY/HABITS:  The patient has a fiancee who is in the room with  him now named  Gavin Pound.  He has two daughters.  He works at a [employer] here in town.  He denies use of tobacco, alcohol, or drugs.   FAMILY HISTORY:  No history of kidney stones.  Otherwise noncontributory.   REVIEW OF SYSTEMS:  As per HPI.  Also, chronic ear pain bilaterally.  No  headache, no muscle or joint pain presently.  Chronic slight lower extremity  edema bilaterally.  No constipation or diarrhea.  No skin rash.   PHYSICAL EXAM:  VITAL SIGNS:  Temperature 97.7, pulse 105, respirations 18,  blood pressure 133/83.  Weight 184 pounds.  GENERAL:  Alert, pleasant, hard of hearing, oriented to person, place, time,  and situation.  In no acute distress.  HEENT:  Both tympanic membranes dull but without bulging or loss of  landmarks.  Nasopharynx without congestion.  Oropharynx pink, with slightly  tachy mucous membranes without lesion or swelling.  NECK:  Supple.  Without lymphadenopathy or thyromegaly.  LUNGS:  Clear to auscultation bilaterally.  Breathing nonlabored.  CARDIOVASCULAR:  Regular rhythm, slight tachycardia.  No murmur, rub, or  gallop.  ABDOMEN:  Soft, nontender, nondistended.  Bowel sounds are normoactive.  There is tenderness in the right flank and right lower back area.  EXTREMITIES:  Show trace lower extremity edema bilaterally.  Normal  sensation to the feet and ankles.  No ulcerations seen.   LABORATORY WORK:  Urinalysis from clinic today showed large blood, 100  protein, no ketones, negative leukocyte esterase, and negative nitrite.  Complete metabolic panel:  Sodium 136, potassium 3.8, chloride 104,  bicarbonate 24, glucose 237, BUN 22, creatinine 1.5.  Total bilirubin 0.6,  alkaline phosphatase 39, AST 18, ALT 20, total protein 6.5, albumin 3.5,  calcium 9.  CBC showed a white blood cell count of 9.2, hemoglobin 12, and a  platelet count of 275,000; differential with 84% neutrophils and 10%  lymphocytes.  Uric acid level 5.9.   CT scan of the abdomen and  pelvis showed right perinephric edema and  hydronephrosis, but no stone was seen.  Appendix appeared normal.   ASSESSMENT AND PLAN:  1. Renal colic:  His flank pain and gross hematuria are most consistent with     this, although no stone was seen on CT scan.  However, the hydronephrosis     and perinephric edema are likely resultant from previous stone, and he     has likely passed it, although he does not remember seeing one.  Plan to     control pain with morphine in the hospital.  Will give antiemetics for     the nausea and vomiting associated with his renal colic.  Will rehydrate     with IV fluids.  Repeat chemistries in the morning and will send a urine     for further analysis and culture.  Will likely consult either nephrology     or urology while in the hospital.  2. Diabetes type 2:  Will use insulin sliding scale while in the hospital.     Will check hemoglobin A1C in the office as it has been since February     2004 since this was checked, and it was 8.9% at that time.  3. Hypertension:  The patient is dehydrated and his blood pressure is     normal.  Will hold antihypertensives for now.  4. Normocytic anemia:  He has carried this diagnosis in the past, and I will     look through the chart in the clinic to see what the workup has revealed.     ___________________________________________                                         Jeoffrey Massed, M.D.   PHM/MEDQ  D:  01/01/2003  T:  01/01/2003  Job:  [id number]   cc:   [facility]

## 2010-08-12 NOTE — Discharge Summary (Signed)
Joseph [name], Joseph [name]                          ACCOUNT NO.:  [id number]   MEDICAL RECORD NO.:  [id number]                   PATIENT TYPE:  INP   LOCATION:  A341                                 FACILITY:  [facility]   PHYSICIAN:  Jeoffrey Massed, M.D.             DATE OF BIRTH:  1955-05-21   DATE OF ADMISSION:  01/01/2003  DATE OF DISCHARGE:  01/06/2003                                 DISCHARGE SUMMARY   ADMISSION DIAGNOSES:  1. Renal colic.  2. Right hydronephrosis.  3. Intractable nausea/vomiting.  4. Dehydration.   DISCHARGE DIAGNOSES:  1. Renal colic.  2. Right hydronephrosis.  3. Intractable nausea/vomiting.  4. Dehydration.  5. Status post insertion of J-type stent in the right ureter.   DISCHARGE MEDICATIONS:  1. Levaquin 250 mg p.o. daily for six days.  2. Percocet 7.5 mg/325 mg one tablet q.6h. p.r.n.  3. Phenergan tablets/suppository 12.5 mg p.o. or PR q.6h. p.r.n.  4. Hydrochlorothiazide 25 mg p.o. daily.  5. Actos 45 mg p.o. daily.  6. Glucophage 500 mg p.o. b.i.d.  7. Glyburide 10 mg p.o. b.i.d.  8. Benicar 40 mg daily.   CHRONIC PROBLEM LIST:  1. Diabetes type 2, non-insulin-dependent.  2. Hypertension.  3. Gastroesophageal reflux disease.  4. Normocytic anemia.  5. History of chronic/intermittent abdominal pain.  6. History of diabetic gastroparesis.  7. Status post ORIF of right ankle.   CONSULTS:  Urology - Dr. Alleen Borne saw the patient on January 02, 2003.   PROCEDURES:  1. CT scan - noncontrast on January 01, 2003.  This showed perinephric edema     and right hydronephrosis without a stone seen.  Normal-appearing appendix     for a noncontrast study.  2. IVP and CT of abdomen and pelvis on January 02, 2003.  Showed a filling     defect in the right ureter consistent with clot.  Showed slight     improvement in perinephric edema and right hydroureter.  Again, no stone     seen.  3. Stent placement to right ureter.  Retrograde cystogram was  performed by     Dr. Jerre Simon.  He did not see the filling defect previously seen on the     IVP.  He placed a J-type stent in the right ureter.  He saw no stones.     Urine cytology was done at this time and the results favored an     inflammatory-type reaction without any significant signs of malignancy.   HISTORY AND PHYSICAL:  For complete H&P please see H&P in chart.  Briefly,  this was a 55 year old Hispanic male without any history of kidney stones  who was admitted with acute renal colic and gross hematuria.  Screening CAT  scan of the abdomen and pelvis did not show a stone but showed perinephric  edema and right hydroureter.  This was consistent with the appearance of  having passed  a stone, but the patient continued to have significant pain  and nausea and vomiting.  He was afebrile with stable vital signs and he was  admitted for pain control, control of nausea and vomiting, and IV fluid  hydration.  He was also admitted for further workup to see the etiology of  his pain.   HOSPITAL COURSE BY PROBLEM:  #1 - RENAL COLIC.  The patient was admitted and  immediately placed on IV pain medication and IV fluids.  I consulted Dr.  Jerre Simon, a urologist, over the phone and discussed the case with him.  His  impression was that this was likely a passed stone and that if he continued  to have pain overnight then he would do a formal consult and further  studies.  The patient did continue to have significant pain overnight and  Dr. Jerre Simon saw the patient the next day.  In the interim an IVP was  performed and showed a small filling defect in the right ureter consistent  with a blood clot.  No stone was seen on this study.  The patient continued  to require IV pain medicine and continued to have some gross hematuria.  He  then underwent cystoscopy with cystogram and placement of the J-type stent  as dictated in the procedures section of this dictation.  The patient's pain  gradually lessened  but he did still have some significant pain requiring  oral narcotic pain medication on discharge.  No stones were found during  this admission, but the clinical picture of acute onset of renal colic,  gross hematuria, and the findings of anatomy consistent with obstruction  support the diagnosis of a passed stone.  Again, cytology did not show any  sign of malignancy.  The patient was discharged home on oral narcotic pain  medication and has follow-up with the urologist in approximately one week.   #2 - NAUSEA, VOMITING, AND DEHYDRATION.  The patient was unable to take any  pain medicine or antiemetics by mouth on the day of admission.  He was  started on IV fluids and given antiemetics and his vomiting ceased.  He did  have a poor appetite during the hospitalization but no further vomiting.  He  did complain of some intermittent nausea.  It was believed this was  secondary to the severe pain associated with his urinary obstruction.   #3 - DIABETES TYPE 2.  The patient was admitted to the hospital and placed  on sliding scale insulin and CBGs were obtained q.6h. while not eating.  The  CBGs were in good control throughout the hospitalization until the last day  when he began eating more of a regular diabetic diet.  On discharge he was  instructed to resume his home diabetic medication as previously dictated.   DISPOSITION:  The patient was discharged home in improved condition on the  previously-mentioned discharge medications.  He was instructed to hold his  aspirin until his hematuria had totally resolved.  A follow-up was made  with Dr. Jerre Simon.  He was instructed to gradually increase his activity as  tolerated and he was given a note to excuse him from work until January 12, 2003.  He was instructed to gradually increase his diet as tolerated and to  resume his regular diabetic diet.     ___________________________________________  Jeoffrey Massed, M.D.   PHM/MEDQ  D:  01/07/2003  T:  01/07/2003  Job:  [id number]   cc:   Ky Barban, M.D.  [address]  [place]  [place] [address]  Fax: [phone]

## 2010-08-12 NOTE — Consult Note (Signed)
Joseph [name], READING                          ACCOUNT NO.:  [id number]   MEDICAL RECORD NO.:  [id number]                   PATIENT TYPE:  INP   LOCATION:  A341                                 FACILITY:  [facility]   PHYSICIAN:  Ky Barban, M.D.            DATE OF BIRTH:  1955-09-06   DATE OF CONSULTATION:  01/02/2003  DATE OF DISCHARGE:                                   CONSULTATION   CHIEF COMPLAINT:  Right flank pain.   HISTORY:  This 55 year old gentleman was admitted by Dr. Milinda Cave, yesterday,  with gross total hematuria and severe right flank pain with clinical  diagnosis of renal colic and maybe he is passing a stone.  A screening CT  was done. It did not show any stone, but it does show perinephric stranding  on the right side.  Also there was some hydronephrosis, but no definite  stone in the ureter.  During the night the patient continued to have pain;  so I was asked by Dr. Milinda Cave to see the patient.  He is still having pain.  No voiding difficulties, fever or chills. He denies having any history of  kidney stones in the past.  Does give a history of having nausea and  vomiting.   PAST MEDICAL HISTORY:  1. Type 2 diabetes  2. Hypertension  3. GE reflux  4. Anemia  5. Intermittent nausea and vomiting and is believed to be diabetic     gastroparesis.  6. History of open reduction and internal fixation of right ankle after an     accident at work in 2002.   MEDICATIONS:  1. Actos  2. Benecol  3. Glucophage  4. Glyburide  5. Hydrochlorothiazide  6. Aspirin.   ALLERGIES:  NO KNOWN DRUG ALLERGIES.   PHYSICAL EXAMINATION:  GENERAL:  On examination a moderately built male, who  is in moderate distress; fully conscious; alert; and oriented.  VITAL SIGNS:  Blood pressure 120/80, temperature is normal.  ABDOMEN:  Slightly full, but not distended.  There is 1+ right CVA  tenderness.  No masses palpable.  GENITALIA:  External genitalia are unremarkable.   LABORATORY DATA:  Urinalysis done by Dr. Milinda Cave, in his office, shows a  large amount of blood, no ketones, and negative leukocyte esterase, negative  nitrite.   His Metabolic-7, today, sodium 138, potassium 3.4, chloride 105, CO2 28,  glucose 143, BUN 15, creatinine 1.2, calcium 8.2.  WBC count 7.2, hematocrit  32.8.   IMPRESSION:  Right renal colic.  The patient had an IVP and CT scan done  again. It showed the hydronephrosis had lessened somewhat, continued to have  perinephric stenting which is understandable because it just started  yesterday.  Right mid-ureter that has a filling defect, looks like a blood  clot.  It would be papillary necrosis, but there is no stone.  My feeling is  that we need to treat him conservatively  with pain medicine.  Hopefully he  will pass this filling defect which is most likely a blood clot. But, if the  pain gets worse or if he becomes septic will consider doing a retrograde  pyelogram, double-J stent with urethroscopy.  I have discussed this with the  patient, he understands and will follow him. Will also order urine  cytologies.   I appreciate Dr. Milinda Cave for letting me see his patient.      ___________________________________________                                            Ky Barban, M.D.   MIJ/MEDQ  D:  01/02/2003  T:  01/02/2003  Job:  [id number]   cc:   Jeoffrey Massed, M.D.  [facility]  [place], [place] [address]  Fax: [phone]

## 2010-08-12 NOTE — Discharge Summary (Signed)
Joseph [name], HEINECKE NO.:  [id number]   MEDICAL RECORD NO.:  [id number]                   PATIENT TYPE:  INP   LOCATION:  5731                                 FACILITY:  [facility]   PHYSICIAN:  Lina Sar, M.D. [facility]               DATE OF BIRTH:  06/06/1955   DATE OF ADMISSION:  10/16/2003  DATE OF DISCHARGE:  10/22/2003                                 DISCHARGE SUMMARY   ADMISSION DIAGNOSES:  1. A four to six-week history of nausea and vomiting with four-day history     of inability to keep down p.o.'s.  Rule out diabetic gastroparesis; rule     out peptic ulcer disease; rule out gastric outlet obstruction.  2. Insulin-requiring diabetes mellitus, type 2, with 15-year history of     diabetes.  3. Hypertension.  4. History of ureterolithiasis.  5. Fatty liver.  6. Depression.  7. History of surgery following fracture of the right ankle.   DISCHARGE DIAGNOSES:  1. Nausea and vomiting with abdominal pain, etiology felt to be     gastroparesis, improved with medication adjustment.  2. Hypokalemia, corrected.  3. Hypertension, improved.  Blood pressure controlled with addition of new     beta blocker.  4. Microalbuminuria.  5. Diabetes mellitus, type 2.  Hemoglobin A1C indicates inadequate long-term     control.  6. Depression.  Note:  Medications were changed in an effort to minimize     gastrointestinal side effects during this admission.  Depression and     related functional gastrointestinal complaints may be contributing to     patient's symptomatology.  7. Blue discoloration of distal esophagus on upper endoscopy.  Biopsies are     benign.   CONSULTATIONS:  Family practice teaching service, Asencion Partridge, M.D   PROCEDURES:  Upper endoscopy by Dr. Stan Head on October 17, 2003.  Mucosa  was normal with the exception of blue discoloration in the distal esophagus.  Biopsies of the area reveal benign squamous mucosa.  Discoloration felt  secondary  to retention of a pill.   BRIEF HISTORY:  Joseph [name] is a 55 year old Hispanic male referred by Dr.  Kathi Der office to [facility].  He has been seen in the past by Dr. Yancey Flemings, though it is unclear if he has undergone any studies by Dr. Marina Goodell in  the past.  He had also been seen by Dr. Karilyn Cota about four to five years ago.  He had a history for at least a couple of years of intermittent nausea and  vomiting which was attributed to diabetic gastroparesis.  A trial of Reglan  had been used in the past, but it is no clear whether or not it was of any  benefit.  For the past four to six weeks, the patient had developed more  persistent symptoms of nausea and vomiting as well as epigastric area pain.  The symptoms  had worsened over the course of the month to the point where he  was vomiting daily, and for the three or four days prior to being seen by  Dr. Russella Dar in the office, he had been unable to keep much of anything down.  He had gone to [facility] ER on July 15 and July 21, but he left the ER on July 21  because it took so long to see a physician and proceeded to [facility] ER.  He  received IV fluids, antiemetics, and was discharged to home.  Studies  earlier in the month included an ultrasound which was remarkable only for a  fatty liver.  A CT scan also performed recently had shown abnormal abdominal  and pelvic views.  LFTs were normal.  Albumin was 4.  Hemoglobin was 12.  CBGs were in the 250 range.  He was referred to see a doctor at [facility]  and saw Dr. Melene Muller of 22 July and was admitted from the office for  symptomatic control and further diagnostic investigation.   LABORATORY AND X-RAY DATA:  Hemoglobin 11.5, hematocrit 34.2, white blood  cell count 6.4, MCV 82.4, platelets 279,000.  Sodium at one point was 132;  it was 135 at discharge.  Potassium low of 3.2; it was 3.3 at discharge.  Glucose ranged anywhere from 147 up to 199.  BUN was 6, creatinine 1.3.  Albumin 3.7.  Total bilirubin  1, alkaline phosphatase 39, SGOT 23, SGPT 19.  Albumin was 3.7.  Glycosylated hemoglobin was 8.3.  TSH was 0.624.   A two-view of the abdomen showed an unremarkable bowel gas pattern with no  evidence for obstruction, ileus, or free air.   HOSPITAL COURSE:  #1.  DIABETIC GASTROPARESIS:  The patient was admitted to non-telemetry bed  and treated with clear liquid diet, normal saline, IV fluid hydration, IV  Reglan and Protonix as well as IV Phenergan p.r.n.  He was continued on  essential oral medications, but oral glucose-control agents were held.  On  hospital day #2, the patient underwent upper endoscopy.  The blue  discoloration of distal esophagus has been mentioned, and this was  attributed to a retained pill that had passed and had left behind some dye.  No mucosal source for his symptoms was found.  His symptoms continued.  He  was having additional increase in epigastric pain.  His Reglan was increased  from 10 mg every 6 hours to 20 mg every 6 hours, and morphine was added  p.r.n.  Zofran was later added.  Zelnorm was added.  The patient did not  have any stools for first days of hospitalization.   After discussion with the patient, it seems that the acute exacerbation of  what had previously been intermittent symptoms of nausea and vomiting had  occurred when he was started on Zelnorm.  We made the switch from Zelnorm to  Lexapro in hopes that we could alleviate possible GI side effects of the  Zelnorm.  The change of medication was made on 25 July.  By 26 July, the  patient's nausea had decreased.  He had one loose stool and less abdominal  discomfort.   Ultimately we were able to advance the patient's diet to a low-reside diet  which he tolerated.  Abdominal exam was improved with less tenderness.  Zelnorm was discontinued, and he was to be sent home with prescription for Reglan, Protonix, and the Lexapro in addition to some other new medications  to be mentioned.   #  2.   DIABETES:  Diabetes was not optimally controlled during this admission.  We did treat the patient with sliding scale insulin and held many of his  oral agents while were limiting his p.o. intake.  His hemoglobin A1C was  high.  The family practice service along with the diabetes coordinating RN  assisted [place] with treating the elevated sugars.  New strategies for managing  the diabetes include regular use of Lantus insulin of 4 units at bedtime.  He had previously used Lantus but was not using it on a regular basis and  had not been using it in the several weeks prior to coming to the hospital.   Note that the patient states that he has never gone to a formal diabetic  education class and, thus, has not a lot of insight into how to control his  blood sugars.  He needs to have referral from his primary MD to a diabetic  education course which he should attend with his wife in the near future.   #3.  DEPRESSION:  It was obvious from speaking with the patient and  observing him that he is depressed.  He had been started on Zoloft, and this  again was felt possibly to be a source for exacerbation of gastroparesis.  Therefore, we switched him over to Lexapro.  If it was this change in  medications or other medication adjustments that resulted in his clinical  improvement, it is hard to determine; however, we continued him on Lexapro  at discharge.   #4.  HYPOKALEMIA:  Initially, the patient's potassium was normal.  However,  with the use of non-potassium-containing IV fluids, his potassium dropped.  Potassium was added to his IV fluids and was correcting and at near normal  levels at discharge.   #5.  HYPERTENSION:  The patient has history of hypertension and, on several  readings, he had systolic blood pressure readings up into the high 100s and  low 200s.  Diastolic blood pressure reached up as high as 114 and often  above 80.  Some of these high readings may have been secondary to patient   being in some abdominal discomfort.  The family medicine service added  Lopressor to his medical regimen, and his blood pressure readings adjusted  to a normotensive level nicely.  He was given a prescription for this new  Lopressor at discharge.   CONDITION ON DISCHARGE:  Improved and stable.   FOLLOW UP:  Office visit with Dr. Claudette Head on August 17 and with Dr.  Ian Bushman at [facility] on August 3.   DISCHARGE MEDICATIONS:  1. Altace 10 mg daily.  2. Hydrochlorothiazide 25 mg daily.  3. Avapro 300 mg daily.  4. Lexapro 10 mg daily.  5. Lopressor 25 mg twice daily.  6. Nexium 40 mg daily.  7. Reglan 10 mg 20 minutes before meals.  He can take Reglan at bedtime if     needed for overnight nausea and vomiting; so the bedtime dose is     optional.  8. Metformin 500 mg twice daily.  9. Coated aspirin 81 mg, to be restarted on August 1. 10.      Glyburide 5 mg twice daily.  11.      Benicar 40 mg daily.  12.      Actos 45 mg daily.  13.      Lantus insulin 4 units subcutaneously at bedtime.  14.      Tylenol p.r.n.  DISCHARGE INSTRUCTIONS:  1. Diet at discharge, he is to follow a tightly controlled diabetic diet and     avoid high-fiber foods.  A low-fat diet     was stressed as part of the diabetic diet.  2. He was to call [facility] if he had any further problems.  3. He was also to request referral to a diabetic teaching class at the     appointment with Dr. Ian Bushman.      Jennye Moccasin, P.A. [facility]                   Lina Sar, M.D. [facility]    SG/MEDQ  D:  11/11/2003  T:  11/11/2003  Job:  [id number]   cc:   Ernestina Penna, M.D.  [address] [place]  [place] [address]  Fax: [phone]   Dr. Angelene Giovanni [facility]   Wilhemina Bonito. Marina Goodell, M.D. [facility]

## 2010-08-12 NOTE — Op Note (Signed)
NAMEELMUS, MATHES              ACCOUNT NO.:  [id number]   MEDICAL RECORD NO.:  [id number]          PATIENT TYPE:  OIB   LOCATION:  5705                         FACILITY:  [facility]   PHYSICIAN:  Beulah Gandy. Ashley Royalty, M.D. DATE OF BIRTH:  July 06, 1955   DATE OF PROCEDURE:  10/12/2005  DATE OF DISCHARGE:                                 OPERATIVE REPORT   PREOPERATIVE DIAGNOSIS:  Proliferative diabetic retinopathy, traction  retinal detachment, vitreous hemorrhage right eye.   PROCEDURES:  Pars plana vitrectomy with membrane peel, 25 gauge system, gas  fluid exchange, retinal photocoagulation in the right eye.   SURGEON:  Alan Mulder, M.D.   ASSISTANT:  Shana Chute R.N.   ANESTHESIA:  General.   DETAILS:  Usual prep and drape.  25 gauge trocars at 8, 10, 2 o'clock.  The  infusion port placed at 8 o'clock. The lighted pick and the cutter placed at  10 and 2 o'clock respectively.  Provisc placed on the corneal surface.  Biome viewing system moved into place.  Pars plana vitrectomy was begun just  behind the crystalline lens.  Blood and vitreous was encountered.  This was  carefully removed under low suction and rapid cutting. Traction was seen on  the lower arcade.  This traction was removed with the vitreous forceps and  the vitreous cutter. Endo cautery was also used. An additional cutting and  was performed with the 25-gauge cutter.  The vitrectomy was carried out to  the peripheral vitreous base and vitreous and vitreous strands and blood  were encountered here. Scleral depression was used to gain access to this  area and trimmed the vitreous base.  All blood was removed.  The Endo  cautery was placed in the eye and cautery points near the macula were  treated. The endolaser was positioned in the eye, 1337 burns were placed  around the retinal periphery with a power 1000 milliwatts, 1000 microns each  and 0.05 seconds each.  A washout procedure was performed and then 30% gas  fluid  exchange was performed.  The instruments were removed from the eye.  The 25 gauge trocars were removed. Wounds were tested and found to be tight.  Polymyxin and gentamicin were irrigated into tenons space.  Marcaine was  injected around the globe for postop pain.  TobraDex ophthalmic ointment was  placed, Decadron 10 mg was injected into the lower subconjunctival space.  Closing pressure was 15 with a Barraquer keratometer.  Complications none.  Duration 1/2 hour.  The patient is awakened and taken to recovery in  satisfactory condition.      Beulah Gandy. Ashley Royalty, M.D.  Electronically Signed     JDM/MEDQ  D:  10/12/2005  T:  10/13/2005  Job:  [id number]

## 2010-12-23 LAB — URINALYSIS, ROUTINE W REFLEX MICROSCOPIC
Protein, ur: NEGATIVE
Urobilinogen, UA: 0.2
pH: 6

## 2010-12-23 LAB — HEMOGLOBIN AND HEMATOCRIT, BLOOD
HCT: 37 — ABNORMAL LOW
HCT: 37.2 — ABNORMAL LOW
HCT: 37.5 — ABNORMAL LOW
Hemoglobin: 12.6 — ABNORMAL LOW

## 2010-12-23 LAB — CBC
HCT: 34.2 — ABNORMAL LOW
HCT: 39.2
Hemoglobin: 11.5 — ABNORMAL LOW
Hemoglobin: 12.8 — ABNORMAL LOW
MCHC: 33
MCHC: 34.4
MCV: 81.6
MCV: 83.5
MCV: 83.7
Platelets: 241
Platelets: 295
RBC: 4.08 — ABNORMAL LOW
RBC: 4.8
RDW: 12.9
WBC: 7.3

## 2010-12-23 LAB — COMPREHENSIVE METABOLIC PANEL
ALT: 20
ALT: 20
Albumin: 3 — ABNORMAL LOW
Albumin: 3.8
Alkaline Phosphatase: 47
BUN: 10
BUN: 20
CO2: 20
CO2: 21
CO2: 25
Calcium: 8.6
Chloride: 104
Creatinine, Ser: 1.24
Creatinine, Ser: 1.31
Creatinine, Ser: 1.52 — ABNORMAL HIGH
GFR calc Af Amer: 59 — ABNORMAL LOW
GFR calc Af Amer: >60
GFR calc non Af Amer: 48 — ABNORMAL LOW
GFR calc non Af Amer: 57 — ABNORMAL LOW
GFR calc non Af Amer: >60
Glucose, Bld: 180 — ABNORMAL HIGH
Potassium: 3.9
Sodium: 134 — ABNORMAL LOW
Total Bilirubin: 0.7
Total Protein: 6.6
Total Protein: 7

## 2010-12-23 LAB — CROSSMATCH: ABO/RH(D): O POS

## 2010-12-23 LAB — CARDIAC PANEL(CRET KIN+CKTOT+MB+TROPI)
CK, MB: 1.8
Relative Index: INVALID
Total CK: 79
Troponin I: 0.01

## 2010-12-23 LAB — PROTIME-INR
INR: 1
Prothrombin Time: 12.8

## 2010-12-23 LAB — BASIC METABOLIC PANEL
CO2: 22
Chloride: 103
Creatinine, Ser: 1.22
GFR calc Af Amer: >60
Potassium: 4.2

## 2010-12-23 LAB — DIFFERENTIAL
Basophils Absolute: 0
Basophils Relative: 0
Eosinophils Absolute: 0.1
Monocytes Absolute: 0.4
Neutro Abs: 5
Neutrophils Relative %: 71

## 2010-12-23 LAB — POCT CARDIAC MARKERS: Troponin i, poc: <0.05

## 2010-12-23 LAB — RAPID URINE DRUG SCREEN, HOSP PERFORMED
Barbiturates: NOT DETECTED
Cocaine: NOT DETECTED
Tetrahydrocannabinol: NOT DETECTED

## 2010-12-23 LAB — KETONES, QUALITATIVE: Acetone, Bld: NEGATIVE

## 2010-12-23 LAB — URINE MICROSCOPIC-ADD ON

## 2010-12-23 LAB — HEMOGLOBIN A1C
Hgb A1c MFr Bld: 11.3 — ABNORMAL HIGH
Mean Plasma Glucose: 325

## 2010-12-23 LAB — ETHANOL: Alcohol, Ethyl (B): 221 — ABNORMAL HIGH

## 2010-12-23 LAB — CK TOTAL AND CKMB (NOT AT ARMC)
CK, MB: 2.4
Relative Index: INVALID

## 2010-12-23 LAB — URIC ACID: Uric Acid, Serum: 7.1

## 2010-12-23 LAB — TROPONIN I: Troponin I: 0.01

## 2010-12-23 LAB — OSMOLALITY: Osmolality: 313 — ABNORMAL HIGH

## 2010-12-23 LAB — APTT: aPTT: 27

## 2010-12-23 LAB — AMMONIA: Ammonia: 13

## 2010-12-29 ENCOUNTER — Other Ambulatory Visit: Payer: Self-pay | Admitting: Family Medicine

## 2011-01-03 ENCOUNTER — Ambulatory Visit
Admission: RE | Admit: 2011-01-03 | Discharge: 2011-01-03 | Disposition: A | Discharge status: Home / Self Care | Payer: Medicaid Other | Source: Ambulatory Visit | Attending: Family Medicine | Admitting: Family Medicine

## 2011-02-20 ENCOUNTER — Ambulatory Visit (INDEPENDENT_AMBULATORY_CARE_PROVIDER_SITE_OTHER): Payer: Medicare Other | Admitting: Ophthalmology

## 2011-02-20 DIAGNOSIS — E11359 Type 2 diabetes mellitus with proliferative diabetic retinopathy without macular edema: Secondary | ICD-10-CM

## 2011-02-20 DIAGNOSIS — H43819 Vitreous degeneration, unspecified eye: Secondary | ICD-10-CM

## 2011-02-20 DIAGNOSIS — E1139 Type 2 diabetes mellitus with other diabetic ophthalmic complication: Secondary | ICD-10-CM

## 2011-02-20 DIAGNOSIS — H2510 Age-related nuclear cataract, unspecified eye: Secondary | ICD-10-CM

## 2011-08-22 ENCOUNTER — Ambulatory Visit (INDEPENDENT_AMBULATORY_CARE_PROVIDER_SITE_OTHER): Payer: Medicare Other | Admitting: Ophthalmology

## 2011-08-22 DIAGNOSIS — H43819 Vitreous degeneration, unspecified eye: Secondary | ICD-10-CM

## 2011-08-22 DIAGNOSIS — E11359 Type 2 diabetes mellitus with proliferative diabetic retinopathy without macular edema: Secondary | ICD-10-CM

## 2011-08-22 DIAGNOSIS — E1065 Type 1 diabetes mellitus with hyperglycemia: Secondary | ICD-10-CM

## 2011-08-22 DIAGNOSIS — H2510 Age-related nuclear cataract, unspecified eye: Secondary | ICD-10-CM

## 2012-01-25 ENCOUNTER — Ambulatory Visit (HOSPITAL_COMMUNITY)
Admission: RE | Admit: 2012-01-25 | Discharge: 2012-01-25 | Disposition: A | Discharge status: Home / Self Care | Payer: Medicare Other | Source: Ambulatory Visit | Attending: Internal Medicine | Admitting: Internal Medicine

## 2012-01-25 ENCOUNTER — Other Ambulatory Visit (HOSPITAL_COMMUNITY): Payer: Self-pay | Admitting: Internal Medicine

## 2012-01-25 DIAGNOSIS — Z01811 Encounter for preprocedural respiratory examination: Secondary | ICD-10-CM

## 2012-01-26 ENCOUNTER — Other Ambulatory Visit: Payer: Self-pay | Admitting: *Deleted

## 2012-01-29 ENCOUNTER — Encounter (HOSPITAL_COMMUNITY): Payer: Self-pay | Admitting: General Practice

## 2012-01-29 ENCOUNTER — Inpatient Hospital Stay (HOSPITAL_COMMUNITY)
Admission: RE | Admit: 2012-01-29 | Discharge: 2012-01-31 | DRG: 287 | Disposition: A | Discharge status: Home / Self Care | Payer: Medicare Other | Source: Ambulatory Visit | Attending: Internal Medicine | Admitting: Internal Medicine

## 2012-01-29 DIAGNOSIS — N183 Chronic kidney disease, stage 3 unspecified: Secondary | ICD-10-CM | POA: Diagnosis present

## 2012-01-29 DIAGNOSIS — E1159 Type 2 diabetes mellitus with other circulatory complications: Secondary | ICD-10-CM | POA: Diagnosis present

## 2012-01-29 DIAGNOSIS — Z794 Long term (current) use of insulin: Secondary | ICD-10-CM

## 2012-01-29 DIAGNOSIS — G4730 Sleep apnea, unspecified: Secondary | ICD-10-CM | POA: Diagnosis present

## 2012-01-29 DIAGNOSIS — N182 Chronic kidney disease, stage 2 (mild): Secondary | ICD-10-CM | POA: Diagnosis present

## 2012-01-29 DIAGNOSIS — M129 Arthropathy, unspecified: Secondary | ICD-10-CM | POA: Diagnosis present

## 2012-01-29 DIAGNOSIS — E109 Type 1 diabetes mellitus without complications: Secondary | ICD-10-CM | POA: Diagnosis present

## 2012-01-29 DIAGNOSIS — Z87442 Personal history of urinary calculi: Secondary | ICD-10-CM | POA: Diagnosis present

## 2012-01-29 DIAGNOSIS — I2510 Atherosclerotic heart disease of native coronary artery without angina pectoris: Principal | ICD-10-CM

## 2012-01-29 DIAGNOSIS — K219 Gastro-esophageal reflux disease without esophagitis: Secondary | ICD-10-CM | POA: Diagnosis present

## 2012-01-29 DIAGNOSIS — I10 Essential (primary) hypertension: Secondary | ICD-10-CM | POA: Diagnosis present

## 2012-01-29 DIAGNOSIS — F329 Major depressive disorder, single episode, unspecified: Secondary | ICD-10-CM | POA: Diagnosis present

## 2012-01-29 DIAGNOSIS — E108 Type 1 diabetes mellitus with unspecified complications: Secondary | ICD-10-CM | POA: Diagnosis present

## 2012-01-29 DIAGNOSIS — D649 Anemia, unspecified: Secondary | ICD-10-CM | POA: Diagnosis present

## 2012-01-29 DIAGNOSIS — I200 Unstable angina: Secondary | ICD-10-CM | POA: Diagnosis not present

## 2012-01-29 DIAGNOSIS — F3289 Other specified depressive episodes: Secondary | ICD-10-CM | POA: Diagnosis present

## 2012-01-29 DIAGNOSIS — E785 Hyperlipidemia, unspecified: Secondary | ICD-10-CM | POA: Diagnosis present

## 2012-01-29 DIAGNOSIS — I129 Hypertensive chronic kidney disease with stage 1 through stage 4 chronic kidney disease, or unspecified chronic kidney disease: Secondary | ICD-10-CM | POA: Diagnosis present

## 2012-01-29 DIAGNOSIS — E669 Obesity, unspecified: Secondary | ICD-10-CM | POA: Diagnosis present

## 2012-01-29 DIAGNOSIS — Z79899 Other long term (current) drug therapy: Secondary | ICD-10-CM

## 2012-01-29 DIAGNOSIS — Z7982 Long term (current) use of aspirin: Secondary | ICD-10-CM

## 2012-01-29 HISTORY — DX: Depression, unspecified: F32.A

## 2012-01-29 HISTORY — DX: Sleep apnea, unspecified: G47.30

## 2012-01-29 HISTORY — DX: Gastro-esophageal reflux disease without esophagitis: K21.9

## 2012-01-29 HISTORY — DX: Essential (primary) hypertension: I10

## 2012-01-29 HISTORY — DX: Anemia, unspecified: D64.9

## 2012-01-29 HISTORY — DX: Type 2 diabetes mellitus without complications: E11.9

## 2012-01-29 HISTORY — DX: Major depressive disorder, single episode, unspecified: F32.9

## 2012-01-29 LAB — BASIC METABOLIC PANEL
Calcium: 9 mg/dL (ref 8.4–10.5)
GFR calc Af Amer: 46 mL/min — ABNORMAL LOW (ref >90)
GFR calc non Af Amer: 40 mL/min — ABNORMAL LOW (ref >90)
Glucose, Bld: 57 mg/dL — ABNORMAL LOW (ref 70–99)
Potassium: 4.4 mEq/L (ref 3.5–5.1)
Sodium: 140 mEq/L (ref 135–145)

## 2012-01-29 LAB — GLUCOSE, CAPILLARY: Glucose-Capillary: 66 mg/dL — ABNORMAL LOW (ref 70–99)

## 2012-01-29 LAB — CBC
Hemoglobin: 12.3 g/dL — ABNORMAL LOW (ref 13.0–17.0)
MCH: 28 pg (ref 26.0–34.0)
MCHC: 33.2 g/dL (ref 30.0–36.0)
Platelets: 272 10*3/uL (ref 150–400)
RBC: 4.4 MIL/uL (ref 4.22–5.81)

## 2012-01-29 MED ORDER — METOPROLOL SUCCINATE ER 100 MG PO TB24
100.0000 mg | ORAL_TABLET | Freq: Every day | ORAL | Status: DC
  Administered 2012-01-30 – 2012-01-31 (×2): 100 mg via ORAL
  Filled 2012-01-29 (×2): qty 1

## 2012-01-29 MED ORDER — ASPIRIN 81 MG PO CHEW
324.0000 mg | CHEWABLE_TABLET | ORAL | Status: AC
  Administered 2012-01-30: 324 mg via ORAL
  Filled 2012-01-29: qty 4

## 2012-01-29 MED ORDER — GABAPENTIN 300 MG PO CAPS
300.0000 mg | ORAL_CAPSULE | Freq: Three times a day (TID) | ORAL | Status: DC
  Administered 2012-01-29 – 2012-01-31 (×6): 300 mg via ORAL
  Filled 2012-01-29 (×8): qty 1

## 2012-01-29 MED ORDER — SODIUM CHLORIDE 0.9 % IJ SOLN
3.0000 mL | Freq: Two times a day (BID) | INTRAMUSCULAR | Status: DC
  Administered 2012-01-29: 3 mL via INTRAVENOUS

## 2012-01-29 MED ORDER — PANTOPRAZOLE SODIUM 40 MG PO TBEC
40.0000 mg | DELAYED_RELEASE_TABLET | Freq: Every day | ORAL | Status: DC
  Administered 2012-01-30 – 2012-01-31 (×2): 40 mg via ORAL
  Filled 2012-01-29 (×2): qty 1

## 2012-01-29 MED ORDER — INSULIN ASPART 100 UNIT/ML ~~LOC~~ SOLN
0.0000 [IU] | Freq: Three times a day (TID) | SUBCUTANEOUS | Status: DC
Start: 2012-01-29 — End: 2012-01-31
  Administered 2012-01-30: 5 [IU] via SUBCUTANEOUS
  Administered 2012-01-30: 2 [IU] via SUBCUTANEOUS
  Administered 2012-01-31: 8 [IU] via SUBCUTANEOUS

## 2012-01-29 MED ORDER — SODIUM CHLORIDE 0.9 % IV SOLN
1.0000 mL/kg/h | INTRAVENOUS | Status: DC
  Administered 2012-01-29: 1 mL/kg/h via INTRAVENOUS

## 2012-01-29 MED ORDER — SODIUM CHLORIDE 0.9 % IV SOLN: 250.0000 mL | INTRAVENOUS | Status: DC | PRN

## 2012-01-29 MED ORDER — AMLODIPINE BESYLATE 5 MG PO TABS
5.0000 mg | ORAL_TABLET | Freq: Every day | ORAL | Status: DC
  Administered 2012-01-30 – 2012-01-31 (×2): 5 mg via ORAL
  Filled 2012-01-29 (×2): qty 1

## 2012-01-29 MED ORDER — INSULIN GLARGINE 100 UNIT/ML ~~LOC~~ SOLN
20.0000 [IU] | Freq: Two times a day (BID) | SUBCUTANEOUS | Status: DC
  Administered 2012-01-29 – 2012-01-30 (×2): 20 [IU] via SUBCUTANEOUS
  Administered 2012-01-30: 10 [IU] via SUBCUTANEOUS
  Administered 2012-01-31: 20 [IU] via SUBCUTANEOUS

## 2012-01-29 MED ORDER — SIMVASTATIN 20 MG PO TABS
20.0000 mg | ORAL_TABLET | Freq: Every day | ORAL | Status: DC
  Administered 2012-01-30: 20 mg via ORAL
  Filled 2012-01-29 (×2): qty 1

## 2012-01-29 MED ORDER — SODIUM CHLORIDE 0.9 % IJ SOLN: 3.0000 mL | INTRAMUSCULAR | Status: DC | PRN

## 2012-01-29 MED ORDER — PNEUMOCOCCAL VAC POLYVALENT 25 MCG/0.5ML IJ INJ
0.5000 mL | INJECTION | INTRAMUSCULAR | Status: AC
  Filled 2012-01-29: qty 0.5

## 2012-01-29 MED ORDER — TAMSULOSIN HCL 0.4 MG PO CAPS
0.4000 mg | ORAL_CAPSULE | Freq: Every day | ORAL | Status: DC
  Administered 2012-01-29 – 2012-01-30 (×2): 0.4 mg via ORAL
  Filled 2012-01-29 (×3): qty 1

## 2012-01-29 NOTE — Care Management Note (Unsigned)
    Page 1 of 1   01/29/2012     5:05:34 PM   CARE MANAGEMENT NOTE 01/29/2012  Patient:  Joseph [name], Joseph [name]   Account Number:  [id number]  Date Initiated:  01/29/2012  Documentation initiated by:  GRAVES-BIGELOW,Yannis Broce  Subjective/Objective Assessment:   Pt admitted for IV hydration and plan for cath in am.     Action/Plan:   CM will continue to f/u for disposiiton needs.   Anticipated DC Date:  01/30/2012   Anticipated DC Plan:  HOME/SELF CARE         Choice offered to / List presented to:             Status of service:  In process, will continue to follow Medicare Important Message given?   (If response is "NO", the following Medicare IM given date fields will be blank) Date Medicare IM given:   Date Additional Medicare IM given:    Discharge Disposition:    Per UR Regulation:  Reviewed for med. necessity/level of care/duration of stay  If discussed at Long Length of Stay Meetings, dates discussed:    Comments:

## 2012-01-29 NOTE — Progress Notes (Signed)
UR Completed Riggin Cuttino Graves-Bigelow, RN,BSN [phone]  

## 2012-01-29 NOTE — Progress Notes (Signed)
[facility] The patient is a 56 year old obese Hispanic male with a history of insulin-dependent diabetes mellitus hypertension, dyslipidemia, chronic kidney disease. 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 This past July he had an episode of chest pressure which felt like squeezing and was associated with diaphoresis.  It lasted 8-10 minutes. He subsequently underwent metabolic testing. His peak VO2 was markedly reduced at 58% of predicted. He now presents for hydration and left heart catheterization tomorrow.  He currently has no complaints of chest pain shortness of breath, nausea, vomiting, fever, abdominal pain.  He does state he has some occasional lower extremity edema   Subjective: No complaints  Objective: Vital signs in last 24 hours: Temp:  [97.6 F (36.4 C)] 97.6 F (36.4 C) (11/04 1353) Pulse Rate:  [61] 61  (11/04 1353) BP: (129)/(57) 129/57 mmHg (11/04 1353) SpO2:  [98 %] 98 % (11/04 1353) Weight:  [99.791 kg (220 lb)] 99.791 kg (220 lb) (11/04 1353) Last BM Date: 01/29/12  Intake/Output from previous day:   Intake/Output this shift:    Medications No current facility-administered medications for this encounter.    PE: General appearance: alert, cooperative and no distress Lungs: clear to auscultation bilaterally Heart: regular rate and rhythm, S1, S2 normal, no murmur, click, rub or gallop Abdomen: soft, non-tender; bowel sounds normal; no masses,  no organomegaly Extremities: No LEE Pulses: 2+ and symmetric Skin: Warm and Dry Neurologic: Grossly normal   Assessment/Plan    Active Problems:  DIABETES MELLITUS, TYPE I  HYPERTENSION  GERD  ARTHRITIS  NEPHROLITHIASIS, HX OF  Dyslipidemia  Chronic kidney disease  Plan: Hydrate with normal saline at 100 mL per hour. Left heart catheterization on 01/30/2012. Check CBC and  BMET.  Dr. Blanchie Dessert H&P will be scanned in by medical record.    LOS: 0 days    Joseph [name] 01/29/2012 2:54 PM

## 2012-01-30 ENCOUNTER — Other Ambulatory Visit: Payer: Self-pay | Admitting: *Deleted

## 2012-01-30 ENCOUNTER — Encounter (HOSPITAL_COMMUNITY)
Admission: RE | Disposition: A | Discharge status: Home / Self Care | Payer: Self-pay | Source: Ambulatory Visit | Attending: Internal Medicine

## 2012-01-30 DIAGNOSIS — Z01810 Encounter for preprocedural cardiovascular examination: Secondary | ICD-10-CM

## 2012-01-30 DIAGNOSIS — I2510 Atherosclerotic heart disease of native coronary artery without angina pectoris: Secondary | ICD-10-CM

## 2012-01-30 HISTORY — PX: CARDIAC CATHETERIZATION: SHX172

## 2012-01-30 HISTORY — PX: LEFT HEART CATHETERIZATION WITH CORONARY ANGIOGRAM: SHX5451

## 2012-01-30 LAB — BASIC METABOLIC PANEL WITH GFR
BUN: 26 mg/dL — ABNORMAL HIGH (ref 6–23)
CO2: 28 meq/L (ref 19–32)
Calcium: 9 mg/dL (ref 8.4–10.5)
Chloride: 103 meq/L (ref 96–112)
Creatinine, Ser: 1.68 mg/dL — ABNORMAL HIGH (ref 0.50–1.35)
GFR calc Af Amer: 51 mL/min — ABNORMAL LOW
GFR calc non Af Amer: 44 mL/min — ABNORMAL LOW
Glucose, Bld: 118 mg/dL — ABNORMAL HIGH (ref 70–99)
Potassium: 4 meq/L (ref 3.5–5.1)
Sodium: 139 meq/L (ref 135–145)

## 2012-01-30 LAB — PROTIME-INR
INR: 1.1 (ref 0.00–1.49)
Prothrombin Time: 14.1 s (ref 11.6–15.2)

## 2012-01-30 LAB — GLUCOSE, CAPILLARY
Glucose-Capillary: 141 mg/dL — ABNORMAL HIGH (ref 70–99)
Glucose-Capillary: 59 mg/dL — ABNORMAL LOW (ref 70–99)
Glucose-Capillary: 203 mg/dL — ABNORMAL HIGH (ref 70–99)

## 2012-01-30 SURGERY — LEFT HEART CATHETERIZATION WITH CORONARY ANGIOGRAM
Anesthesia: LOCAL

## 2012-01-30 MED ORDER — HEPARIN SODIUM (PORCINE) 1000 UNIT/ML IJ SOLN
INTRAMUSCULAR | Status: AC
  Filled 2012-01-30: qty 1

## 2012-01-30 MED ORDER — MIDAZOLAM HCL 2 MG/2ML IJ SOLN
INTRAMUSCULAR | Status: AC
  Filled 2012-01-30: qty 2

## 2012-01-30 MED ORDER — VERAPAMIL HCL 2.5 MG/ML IV SOLN
INTRAVENOUS | Status: AC
  Filled 2012-01-30: qty 2

## 2012-01-30 MED ORDER — SODIUM CHLORIDE 0.9 % IV SOLN: INTRAVENOUS | Status: DC

## 2012-01-30 MED ORDER — ACETAMINOPHEN 325 MG PO TABS: 650.0000 mg | ORAL_TABLET | ORAL | Status: DC | PRN

## 2012-01-30 MED ORDER — HEPARIN (PORCINE) IN NACL 2-0.9 UNIT/ML-% IJ SOLN
INTRAMUSCULAR | Status: AC
  Filled 2012-01-30: qty 1000

## 2012-01-30 MED ORDER — SODIUM CHLORIDE 0.9 % IV SOLN: 250.0000 mL | INTRAVENOUS | Status: DC

## 2012-01-30 MED ORDER — OXYCODONE-ACETAMINOPHEN 5-325 MG PO TABS: 1.0000 | ORAL_TABLET | ORAL | Status: DC | PRN

## 2012-01-30 MED ORDER — VERAPAMIL HCL 2.5 MG/ML IV SOLN
INTRAVENOUS | Status: AC
  Filled 2012-01-30: qty 8

## 2012-01-30 MED ORDER — LIDOCAINE HCL (PF) 1 % IJ SOLN
INTRAMUSCULAR | Status: AC
  Filled 2012-01-30: qty 30

## 2012-01-30 MED ORDER — ONDANSETRON HCL 4 MG/2ML IJ SOLN: 4.0000 mg | Freq: Four times a day (QID) | INTRAMUSCULAR | Status: DC | PRN

## 2012-01-30 MED ORDER — NITROGLYCERIN 0.2 MG/ML ON CALL CATH LAB
INTRAVENOUS | Status: AC
  Filled 2012-01-30: qty 1

## 2012-01-30 MED ORDER — FENTANYL CITRATE 0.05 MG/ML IJ SOLN
INTRAMUSCULAR | Status: AC
  Filled 2012-01-30: qty 2

## 2012-01-30 NOTE — Consult Note (Addendum)
[address]            [place] [address]          [phone]       Joseph [name] [facility] Medical Record #[id number] Date of Birth: 06/28/1955  Referring: Dr Rennis Golden Primary Care: Herb Grays, MD  Chief Complaint:   Chest pain in July, positive stress test   History of Present Illness:     Patient is a 56 year old male with 25 or greater years of diabetes who presented to Dr. Rennis Golden with an episode of chest discomfort in July , at that time he noted approximately 8-10 minutes of substernal discomfort . He has not had recurrence since that time Stress test was reportedly negative, though he had positive He has been describing unstable angina symptoms and functional metabolic testing revealed MET-a low peak VO2 of 58% predicted, concerning for significant ischemia in an insulin dependent diabetic with multiple cardiac risk factors.  Current Activity/ Functional Status: Patient is independent with mobility/ambulation, transfers, ADL's, IADL's.   Past Medical History  Diagnosis Date  . Hypertension   . Depression   . Diabetes mellitus without complication     insulin dependent  . Chronic kidney disease     " My kidneys are not functioning right "  . GERD (gastroesophageal reflux disease)   . Anemia   . Sleep apnea     USES CPAP    Past Surgical History  Procedure Date  . Ankle fracture surgery     History  Smoking status  . Never Smoker   Smokeless tobacco  . Never Used    History  Alcohol Use No   Famuiy History, father deceased 77 of dm complications, mother died at 16. Four daughters and one son   History   Social History  . Marital Status: Widowed    Spouse Name: [name]    Number of Children: [name]  . Years of Education: [name]   Occupational History  . Retired, worked in Estate agent   Social History Main Topics  . Smoking status: Never Smoker   . Smokeless tobacco: Never Used  . Alcohol Use: No  . Drug Use:  No  . Sexually Active:       No Known Allergies  Medications Prior to Admission  Medication Sig Dispense Refill  . amLODipine (NORVASC) 5 MG tablet Take 5 mg by mouth daily.      Marland Kitchen aspirin EC 81 MG tablet Take 81 mg by mouth daily.      . cholecalciferol (VITAMIN D) 1000 UNITS tablet Take 1,000 Units by mouth daily.      . fenofibrate 160 MG tablet Take 160 mg by mouth daily.      . ferrous sulfate 325 (65 FE) MG tablet Take 325 mg by mouth 2 (two) times daily.      Marland Kitchen gabapentin (NEURONTIN) 300 MG capsule Take 300 mg by mouth 3 (three) times daily.      . insulin aspart (NOVOLOG) 100 UNIT/ML injection Inject 10-15 Units into the skin 3 (three) times daily before meals. Per sliding scale      . insulin glargine (LANTUS) 100 UNIT/ML injection Inject 20 Units into the skin 2 (two) times daily.      Marland Kitchen lisinopril-hydrochlorothiazide (PRINZIDE,ZESTORETIC) 20-25 MG per tablet Take 1 tablet by mouth daily.      . metoprolol succinate (  TOPROL-XL) 100 MG 24 hr tablet Take 100 mg by mouth daily. Take with or immediately following a meal.      . omeprazole (PRILOSEC) 20 MG capsule Take 20 mg by mouth daily.      . pravastatin (PRAVACHOL) 40 MG tablet Take 40 mg by mouth daily.      . sertraline (ZOLOFT) 100 MG tablet Take 150 mg by mouth daily.      . Tamsulosin HCl (FLOMAX) 0.4 MG CAPS Take 0.4 mg by mouth daily after supper.        Current Facility-Administered Medications  Medication Dose Route Frequency Provider Last Rate Last Dose  . 0.9 %  sodium chloride infusion  250 mL Intravenous PRN Wilburt Finlay, PA      . 0.9 %  sodium chloride infusion  1 mL/kg/hr Intravenous Continuous Wilburt Finlay, PA 99.8 mL/hr at 01/29/12 1618 1 mL/kg/hr at 01/29/12 1618  . [MAR HOLD] amLODipine (NORVASC) tablet 5 mg  5 mg Oral Daily Wilburt Finlay, PA   5 mg at 01/30/12 0941  . [COMPLETED] aspirin chewable tablet 324 mg  324 mg Oral Pre-Cath Wilburt Finlay, [place]   324 mg at 01/30/12 0617  . [COMPLETED] fentaNYL  (SUBLIMAZE) 0.05 MG/ML injection           . [facility] HOLD] gabapentin (NEURONTIN) capsule 300 mg  300 mg Oral TID Wilburt Finlay, PA   300 mg at 01/30/12 0942  . [COMPLETED] heparin 1000 UNIT/ML injection           . [COMPLETED] heparin 2-0.9 UNIT/ML-% infusion           . [facility] HOLD] insulin aspart (novoLOG) injection 0-15 Units  0-15 Units Subcutaneous TID WC Wilburt Finlay, PA   2 Units at 01/30/12 0940  . [MAR HOLD] insulin glargine (LANTUS) injection 20 Units  20 Units Subcutaneous BID Wilburt Finlay, PA   10 Units at 01/30/12 0940  . [COMPLETED] lidocaine (XYLOCAINE) 1 % injection           . [facility] HOLD] metoprolol succinate (TOPROL-XL) 24 hr tablet 100 mg  100 mg Oral Daily Wilburt Finlay, PA   100 mg at 01/30/12 0941  . [COMPLETED] midazolam (VERSED) 2 MG/2ML injection           . [COMPLETED] midazolam (VERSED) 2 MG/2ML injection           . [COMPLETED] nitroGLYCERIN (NTG ON-CALL) 0.2 mg/mL injection           . [MAR HOLD] pantoprazole (PROTONIX) EC tablet 40 mg  40 mg Oral Daily Wilburt Finlay, PA   40 mg at 01/30/12 0945  . [facility] HOLD] pneumococcal 23 valent vaccine (PNU-IMMUNE) injection 0.5 mL  0.5 mL Intramuscular Tomorrow-1000 Chrystie Nose, MD      . [facility] HOLD] simvastatin (ZOCOR) tablet 20 mg  20 mg Oral q1800 Wilburt Finlay, PA      . sodium chloride 0.9 % injection 3 mL  3 mL Intravenous Q12H Wilburt Finlay, PA   3 mL at 01/29/12 2120  . sodium chloride 0.9 % injection 3 mL  3 mL Intravenous PRN Wilburt Finlay, PA      . [facility] HOLD] Tamsulosin HCl (FLOMAX) capsule 0.4 mg  0.4 mg Oral QPC supper Wilburt Finlay, PA   0.4 mg at 01/29/12 2120  . [COMPLETED] verapamil (ISOPTIN) 2.5 MG/ML injection           . [COMPLETED] verapamil (ISOPTIN) 2.5 MG/ML injection           . [  COMPLETED] verapamil (ISOPTIN) 2.5 MG/ML injection             Prescriptions prior to admission  Medication Sig Dispense Refill  . amLODipine (NORVASC) 5 MG tablet Take 5 mg by mouth daily.      Marland Kitchen aspirin EC 81 MG tablet Take 81 mg by mouth  daily.      . cholecalciferol (VITAMIN D) 1000 UNITS tablet Take 1,000 Units by mouth daily.      . fenofibrate 160 MG tablet Take 160 mg by mouth daily.      . ferrous sulfate 325 (65 FE) MG tablet Take 325 mg by mouth 2 (two) times daily.      Marland Kitchen gabapentin (NEURONTIN) 300 MG capsule Take 300 mg by mouth 3 (three) times daily.      . insulin aspart (NOVOLOG) 100 UNIT/ML injection Inject 10-15 Units into the skin 3 (three) times daily before meals. Per sliding scale      . insulin glargine (LANTUS) 100 UNIT/ML injection Inject 20 Units into the skin 2 (two) times daily.      Marland Kitchen lisinopril-hydrochlorothiazide (PRINZIDE,ZESTORETIC) 20-25 MG per tablet Take 1 tablet by mouth daily.      . metoprolol succinate (TOPROL-XL) 100 MG 24 hr tablet Take 100 mg by mouth daily. Take with or immediately following a meal.      . omeprazole (PRILOSEC) 20 MG capsule Take 20 mg by mouth daily.      . pravastatin (PRAVACHOL) 40 MG tablet Take 40 mg by mouth daily.      . sertraline (ZOLOFT) 100 MG tablet Take 150 mg by mouth daily.      . Tamsulosin HCl (FLOMAX) 0.4 MG CAPS Take 0.4 mg by mouth daily after supper.        History reviewed. No pertinent family history.   Review of Systems:     Cardiac Review of Systems: Y or N  Chest Pain [  y  ]  Resting SOB [ n  ] Exertional SOB  [ y ]  Orthopnea [ n ]   Pedal Edema [ n  ]    Palpitations [ n ] Syncope  [ n ]   Presyncope [ n  ]  General Review of Systems: [Y] = yes [  ]=no Constitional: recent weight change [n  ]; anorexia [  ]; fatigue [ y ]; nausea [  ]; night sweats [ n ]; fever [  n]; or chills [n  ];                                                                                                                                          Dental: poor dentition[ y ]; Last Dentist visit:   Eye : blurred vision [ n ]; diplopia [  n ]; vision changes [  n];  Amaurosis fugax[ n ]; Resp: cough [  ];  wheezing[ n ];  hemoptysis[  ]; shortness of breath[y  ];  paroxysmal nocturnal dyspnea[ y ]; dyspnea on exertion[y  ]; or orthopnea[n  ];  GI:  gallstones[ n ], vomiting[ n ];  dysphagia[  ]; melena[  ];  hematochezia [  n]; heartburn[  ];   Hx of  Colonoscopy[  ]; GU: kidney stones [ n ]; hematuria[n  ];   dysuria [ n ];  nocturia[  ];  history of     obstruction [  ];             Skin: rash, swelling[n  ];, hair loss[  ];  peripheral edema[  ];  or itching[  ]; Musculosketetal: myalgias[  ];  joint swelling[  ];  joint erythema[  ];  joint pain[  ];  back pain[  ];  Heme/Lymph: bruising[ n ];  bleeding[n  ];  anemia[  ];  Neuro: TIA[ n ];  headaches[  n];  stroke[ n ];  vertigo[n  ];  seizures[n  ];   paresthesias[y  ];  difficulty walking[  ];  Psych:depression[y  ]; anxiety[ y ];  Endocrine: diabetes[ y ];  thyroid dysfunction[ n ];  Immunizations: Flu [ n ]; Pneumococcal[n  ];  Other:  Physical Exam: BP 158/87  Pulse 58  Temp 98.3 F (36.8 C) (Oral)  Resp 20  Ht 5\' 7"  (1.702 m)  Wt 221 lb 1.9 oz (100.3 kg)  BMI 34.63 kg/m2  SpO2 94%  General appearance: alert, cooperative and no distress Neurologic: intact Heart: regular rate and rhythm, S1, S2 normal, no murmur, click, rub or gallop and normal apical impulse Lungs: clear to auscultation bilaterally and normal percussion bilaterally Abdomen: soft, non-tender; bowel sounds normal; no masses,  no organomegaly Extremities: Patient has scarring of the right ankle ankle had surgery x2, his feet are viable without ulcers but there are no palpable pulses at the ankles, he appears to have adequate vein in the upper portions of each leg  patient is noted to be barrel chested, he has no carotid bruits, there is a dressing on the right groin and right wrists from the catheterization without significant hematoma.   Diagnostic Studies & Laboratory data:     Recent Radiology Findings:   Dg Chest 2 View  01/25/2012  *RADIOLOGY REPORT*  Clinical Data: Pre-procedure evaluation.  For heart  catheterization.  CHEST - 2 VIEW  Comparison: 11/02/2007.  05/18/2005.  Findings: There is stable normal appearance of the cardiac silhouette.  Mediastinal and hilar contours appear stable and within normal limits.  No pulmonary infiltrates or masses are evident. No pleural abnormality is evident.  There is minimal degenerative spondylosis.  IMPRESSION: No acute or active cardiopulmonary or pleural abnormalities are evident.   Original Report Authenticated By: Onalee Hua Call    Recent Lab Findings: Lab Results  Component Value Date   WBC 8.4 01/29/2012   HGB 12.3* 01/29/2012   HCT 37.1* 01/29/2012   PLT 272 01/29/2012   GLUCOSE 118* 01/30/2012   ALT 14 11/04/2007   AST 14 11/04/2007   NA 139 01/30/2012   K 4.0 01/30/2012   CL 103 01/30/2012   CREATININE 1.68* 01/30/2012   BUN 26* 01/30/2012   CO2 28 01/30/2012   INR 1.10 01/30/2012   HGBA1C  Value: 11.3 (NOTE)   The ADA recommends the following therapeutic goal for glycemic   control related to Hgb A1C measurement:   Goal of Therapy:   < 7.0% Hgb A1C  Reference: American Diabetes Association: Clinical Practice   Recommendations 2008, Diabetes Care,  2008, 31:(Suppl 1).* 11/02/2007   Hemodynamics:  Central Aortic Pressure / Mean Aortic Pressure: 116/68  LV Pressure / LV End diastolic Pressure: 12  Coronary Angiographic Data:  Left Main: Normal course, bifurcates normally to the LAD and LCx arteries. No significant stenosis.  Left Anterior Descending (LAD): The proximal LAD is calcified with a tubular 80% stenosis up to the D1 branch, where the LAD tapers to 95%. There is 80-90% ostial D1 stenosis at the bifurcation of a large D1 vessel.  1st diagonal (D1): Large vessel with ostial 85-90% stenosis, calcified at the bifurcation.  Circumflex (LCx): Dominant vessel. There are 2 small proximal OM vessels which are insignificant and moderate disease of the distal LCx.  3rd obtuse marginal: Large branch with a proximal to mid-vessel 80% discrete stenosis.  4th  obtuse marginal: Another large branch with a 95% ostial stenosis and a sequential discrete 80% stenosis another 10-15 mm downstream. posterior lateral branch: No significant stenosis.  Right Coronary Artery: Non-dominant vessel, ostial reflux is present during injection. Smaller vessel.  posterior descending artery: No significant stenosis.  posterior lateral branch: Bifurcates and there is moderate to severe proximal disease of both vessels. Impression:  1. Significant proximal to mid-LAD, calcified tubular bifurcation stenosis (High risk lesion).  2. Significant disease of 2 large OM vessels.  3. Left-dominant circulation.  Plan:  1. Given his complex and calcified proximal LAD / bifurcation lesion along with his large OM branch disease and left-dominant circulation, as well as insulin-dependent diabetes, he is a better candidate for coronary bypass.  2. Family has requested Dr. Tyrone Sage. I will contact [facility] for surgical evaluation.  3. Given the complication with the right radial access site and significant dye load, in the setting of CKD, will keep overnight for hydration and anticipate d/c in the morning.       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 Rennis Golden with the patient's 3 vessel disease even with poor quality circumflex vessels coronary artery bypass grafting offers him the best relief of symptoms and preservation of LV function. The patient does have poorly controlled diabetes and renal insufficiency both which increases risk for cardiac surgery. We will followup with his renal function in the a.m. and tentatively have planned for coronary bypass surgery November 12.  The goals risks and alternatives of the planned surgical procedure CABG have been discussed with the patient in  detail. The risks of the procedure including death, infection, stroke, myocardial infarction, bleeding, blood transfusion have all been discussed specifically.  I have quoted Joseph [name] a 6 % of perioperative mortality and a complication rate as high as 25 %. The patient's questions have been answered.Joseph [name] is willing  to proceed with the planned procedure.      Delight Ovens MD  Beeper [phone] Office [phone] 01/30/2012 4:28 PM

## 2012-01-30 NOTE — CV Procedure (Addendum)
[facility]     CARDIAC CATHETERIZATION REPORT  Joseph [name]   [id number] 09-Jan-1956  Performing Cardiologist: Chrystie Nose Primary Physician: Herb Grays, MD Primary Cardiologist:  Dr. Rennis Golden  Procedures Performed:  Left Heart Catheterization via 5 Fr right radial artery access and 27Fr femoral artery access  [place] Coronary Angiography  Axillary and brachial artery angiography  Indication(s): Chest pain, abnormal MET-Test  History: 56 y.o. male obese Hispanic with a history of insulin-dependent diabetes mellitus hypertension, dyslipidemia, chronic kidney disease. He had a negative nuclear stress test recently as well as echocardiogram which showed normal systolic function and borderline pulmonary hypertension. This past July he had an episode of chest pressure which felt like squeezing and was associated with diaphoresis. It lasted 8-10 minutes. He subsequently underwent metabolic testing. His peak VO2 was markedly reduced at 58% of predicted. He now presents for hydration and left heart catheterization tomorrow. He currently has no complaints of chest pain shortness of breath, nausea, vomiting, fever, abdominal pain. He does state he has some occasional lower extremity edema.  He is referred for LHC.   Consent: The procedure with Risks/Benefits/Alternatives and Indications was reviewed with the patient (and family).  All questions were answered.    Risks / Complications include, but not limited to: Death, MI, CVA/TIA, VF/VT (with defibrillation), Bradycardia (need for temporary pacer placement), contrast induced nephropathy, bleeding / bruising / hematoma / pseudoaneurysm, vascular or coronary injury (with possible emergent CT or Vascular Surgery), adverse medication reactions, infection.    The patient (and family) voice understanding and agree to proceed.    Risks of procedure as well as the alternatives and risks of each were explained to the  (patient/caregiver).  Consent for procedure obtained. Consent for signed by MD and patient with RN witness -- placed on chart.  Procedure: The patient was brought to the 2nd Floor [place] [facility] in the fasting state and prepped and draped in the usual sterile fashion for (Right groin and radial) access. (A modified Allen's test with plethysmography was performed on the right wrist demonstrating adequate Ulnar Artery collateral flow).    Sterile technique was used including antiseptics, cap, gloves, gown, hand hygiene, mask and sheet.  Skin prep: Chlorhexidine;  Time Out: Verified patient identification, verified procedure, site/side was marked, verified correct patient position, special equipment/implants available, medications/allergies/relevent history reviewed, required imaging and test results available.  Performed  The right wrist was prepped and draped in the usual fashion. The radial artery pulse was weak, but identified. 3 cc of 1% lidocaine was used to anesthetize the skin over the radial site. After some difficulty the radial artery was cannulated and pulsatile flow was noted. A 27F radial sheath was advanced over a wire into the radial artery until 3/4 had been placed and resistance was met. We then attempted to pass a wire and catheter through the sheath, but were unable without pushing the sheath back. Flow was noted in the sheath. Additional radial cocktail totalling 10 cc was given, but the sheath could not be withdrawn. The patient grimaced with traction on the sheath and spasm of the radial artery was suspected. More sedation was given in the form of fentanyl and versed. 2 cc of SQ nitroglycerin (100 mcg) was administered.  The patient ultimately underwent catheter insertion from the femoral cathter site up to the right brachial artery. Dye was injected that demonstrated spasm of the artery. The catheter was in an accessory artery. There were 2 branches  of the true  radial artery and the accessory artery coarsed along with the true radial artery. Up to 10 mg of IA verapamil was then given and the patient received another 2 cc of verapamil through the sheath introducer, ultimately allowing slow withdrawal of the sheath and placement of a TR band.  The right femoral head was identified using tactile and fluoroscopic technique.  The right groin was anesthetized with 1% subcutaneous Lidocaine.  The right Common Femoral Artery was accessed using the Modified Seldinger Technique with placement of (5 Fr) sheath using the Seldinger technique.  The sheath was aspirated and flushed.  A 5 Fr JL4 Catheter was advanced of over a Standard J wire into the ascending Aorta.  The catheter was used to engage the left coronary artery.  Multiple cineangiographic views of the left coronary artery system(s) were performed. A 5 Fr JR4 Catheter was advanced of over a Safety J wire into the ascending Aorta.  The catheter was used to engage the right coronary artery.  Multiple cineangiographic views of the right coronary artery system(s) were performed. This catheter was then exchanged over the Standard J wire for an angled Pigtail catheter that was advanced across the Aortic Valve.  LV hemodynamics were measured and the catheter was pulled back across the Aortic Valve for measurement of "pull-back" gradient.  The catheter and the wire was removed completely out of the body.  The patient was transferred to the holding area where the sheath was removed with manual pressure held for hemostasis over the groin site.   The radial site was closed with a TR band.  The patient was transported to the cath lab holding area in stable condition.   The patient  was stable before, during and following the procedure.   Patient did tolerate procedure well. There were complications, but no apparent adverse outcome. (See text below for details). EBL: <10 cc  Medications:  Premedication:  None  Sedation: 3 mg  IV Versed, 100 IV mcg Fentanyl  Contrast:  >100 cc Omnipaque  5000 IU heparin IV  [place] Nitroglycerin (100 mcg)  IA Nitroglycerin (100 mcg)  20 cc Radial cocktail   10 mg of IA verapamil  250 cc saline bolus  Hemodynamics:  Central Aortic Pressure / Mean Aortic Pressure: 116/68  LV Pressure / LV End diastolic Pressure:  12  Coronary Angiographic Data:  Left Main:  Normal course, bifurcates normally to the LAD and LCx arteries. No significant stenosis.  Left Anterior Descending (LAD):  The proximal LAD is calcified with a tubular 80% stenosis up to the D1 branch, where the LAD tapers to 95%.  There is 80-90% ostial D1 stenosis at the bifurcation of a large D1 vessel.  1st diagonal (D1):  Large vessel with ostial 85-90% stenosis, calcified at the bifurcation.  Circumflex (LCx):  Dominant vessel. There are 2 small proximal OM vessels which are insignificant and moderate disease of the distal LCx.  3rd obtuse marginal:  Large branch with a proximal to mid-vessel 80% discrete stenosis.  4th obtuse marginal:  Another large branch with a 95% ostial stenosis and a sequential discrete 80% stenosis another 10-15 mm downstream. posterior lateral branch:  No significant stenosis.  Right Coronary Artery: Non-dominant vessel, ostial reflux is present during injection. Smaller vessel.  posterior descending artery: No significant stenosis.  posterior lateral branch:  Bifurcates and there is moderate to severe proximal disease of both vessels.  Impression: 1.  Significant proximal to mid-LAD, calcified tubular bifurcation stenosis (High risk  lesion). 2.  Significant disease of 2 large OM vessels. 3.  Left-dominant circulation.  Plan: 1.  Given his complex and calcified proximal LAD / bifurcation lesion along with his large OM branch disease and left-dominant circulation, as well as insulin-dependent diabetes, he is a better candidate for coronary bypass. 2.  Family has requested Dr. Tyrone Sage.  I  will contact [facility] for surgical evaluation. 3.  Given the complication with the right radial access site and significant dye load, in the setting of CKD, will keep overnight for hydration and anticipate d/c in the morning.  The case and results was discussed with the patient (and family). The case and results was not discussed with the patient's PCP. The case and results was discussed with the patient's Cardiologist.  Time Spend Directly with Patient:  90 minutes  Chrystie Nose, MD, [facility] Attending Cardiologist [facility]  Ladawna Walgren C 01/30/2012, 2:47 PM

## 2012-01-30 NOTE — H&P (Addendum)
     [facility]          INTERVAL PROCEDURE H&P   History and Physical Interval Note:  01/30/2012 8:36 AM  He has been describing unstable angina symptoms and MET-Testing has revealed a low peak VO2 of 58% predicted, concerning for significant ischemia in an insulin dependent diabetic with multiple cardiac risk factors. Will plan radial approach for cath today. He is agreeable.  Joseph [name] has presented today for their planned procedure. The various methods of treatment have been discussed with the patient and family. After consideration of risks, benefits and other options for treatment, the patient has consented to the procedure.  The patients' outpatient history has been reviewed, patient examined, and no change in status from most recent office note within the past 30 days. I have reviewed the patients' chart and labs and will proceed as planned. Questions were answered to the patient's satisfaction.   Joseph Nose, MD, [facility] Attending Cardiologist [facility]  Joseph [name] 01/30/2012, 8:36 AM

## 2012-01-31 ENCOUNTER — Other Ambulatory Visit: Payer: Self-pay | Admitting: *Deleted

## 2012-01-31 ENCOUNTER — Inpatient Hospital Stay (HOSPITAL_COMMUNITY): Payer: Medicare Other

## 2012-01-31 DIAGNOSIS — I2510 Atherosclerotic heart disease of native coronary artery without angina pectoris: Secondary | ICD-10-CM

## 2012-01-31 DIAGNOSIS — Z01810 Encounter for preprocedural cardiovascular examination: Secondary | ICD-10-CM

## 2012-01-31 LAB — CBC
HCT: 35.3 % — ABNORMAL LOW (ref 39.0–52.0)
Hemoglobin: 11.6 g/dL — ABNORMAL LOW (ref 13.0–17.0)
MCH: 27.8 pg (ref 26.0–34.0)
MCHC: 32.9 g/dL (ref 30.0–36.0)
MCV: 84.7 fL (ref 78.0–100.0)
Platelets: 251 10*3/uL (ref 150–400)
RBC: 4.17 MIL/uL — ABNORMAL LOW (ref 4.22–5.81)
RDW: 14 % (ref 11.5–15.5)
WBC: 7.4 10*3/uL (ref 4.0–10.5)

## 2012-01-31 LAB — LIPID PANEL
Cholesterol: 103 mg/dL (ref 0–200)
HDL: 28 mg/dL — ABNORMAL LOW (ref >39)
LDL Cholesterol: 45 mg/dL (ref 0–99)
Total CHOL/HDL Ratio: 3.7 RATIO
Triglycerides: 152 mg/dL — ABNORMAL HIGH (ref <150)
VLDL: 30 mg/dL (ref 0–40)

## 2012-01-31 LAB — GLUCOSE, CAPILLARY

## 2012-01-31 LAB — BASIC METABOLIC PANEL
BUN: 19 mg/dL (ref 6–23)
CO2: 26 mEq/L (ref 19–32)
Calcium: 8.6 mg/dL (ref 8.4–10.5)
Chloride: 103 mEq/L (ref 96–112)
Creatinine, Ser: 1.59 mg/dL — ABNORMAL HIGH (ref 0.50–1.35)
GFR calc Af Amer: 54 mL/min — ABNORMAL LOW (ref >90)
GFR calc non Af Amer: 47 mL/min — ABNORMAL LOW (ref >90)
Glucose, Bld: 197 mg/dL — ABNORMAL HIGH (ref 70–99)
Potassium: 4.3 mEq/L (ref 3.5–5.1)
Sodium: 137 mEq/L (ref 135–145)

## 2012-01-31 LAB — HEMOGLOBIN A1C
Hgb A1c MFr Bld: 6.7 % — ABNORMAL HIGH (ref <5.7)
Mean Plasma Glucose: 146 mg/dL — ABNORMAL HIGH (ref <117)

## 2012-01-31 MED ORDER — ALBUTEROL SULFATE (5 MG/ML) 0.5% IN NEBU
2.5000 mg | INHALATION_SOLUTION | Freq: Once | RESPIRATORY_TRACT | Status: AC
  Administered 2012-01-31: 2.5 mg via RESPIRATORY_TRACT

## 2012-01-31 MED ORDER — HYDRALAZINE HCL 25 MG PO TABS: 25.0000 mg | ORAL_TABLET | Freq: Three times a day (TID) | ORAL | Status: DC

## 2012-01-31 MED ORDER — ASPIRIN 81 MG PO CHEW
81.0000 mg | CHEWABLE_TABLET | Freq: Every day | ORAL | Status: DC
  Administered 2012-01-31: 81 mg via ORAL
  Filled 2012-01-31: qty 1

## 2012-01-31 NOTE — Progress Notes (Signed)
[facility]  Subjective: No Complaints  Objective: Vital signs in last 24 hours: Temp:  [97.7 F (36.5 C)-98.3 F (36.8 C)] 97.9 F (36.6 C) (11/06 0400) Pulse Rate:  [49-79] 62  (11/06 0400) Resp:  [18-20] 18  (11/06 0400) BP: (116-158)/(51-87) 127/70 mmHg (11/06 0400) SpO2:  [93 %-100 %] 93 % (11/06 0400) Last BM Date: 01/30/12  Intake/Output from previous day: 11/05 0701 - 11/06 0700 In: -  Out: 2150 [Urine:2150] Intake/Output this shift:    Medications Current Facility-Administered Medications  Medication Dose Route Frequency Provider Last Rate Last Dose  . 0.9 %  sodium chloride infusion  250 mL Intravenous Continuous Chrystie Nose, MD      . acetaminophen (TYLENOL) tablet 650 mg  650 mg Oral Q4H PRN Chrystie Nose, MD      . [COMPLETED] albuterol (PROVENTIL) (5 MG/ML) 0.5% nebulizer solution 2.5 mg  2.5 mg Nebulization Once Delight Ovens, MD   2.5 mg at 01/31/12 0756  . amLODipine (NORVASC) tablet 5 mg  5 mg Oral Daily Wilburt Finlay, PA   5 mg at 01/30/12 0941  . [COMPLETED] fentaNYL (SUBLIMAZE) 0.05 MG/ML injection           . gabapentin (NEURONTIN) capsule 300 mg  300 mg Oral TID Wilburt Finlay, PA   300 mg at 01/30/12 2137  . [COMPLETED] heparin 1000 UNIT/ML injection           . [COMPLETED] heparin 2-0.9 UNIT/ML-% infusion           . insulin aspart (novoLOG) injection 0-15 Units  0-15 Units Subcutaneous TID WC Wilburt Finlay, PA   5 Units at 01/30/12 1737  . insulin glargine (LANTUS) injection 20 Units  20 Units Subcutaneous BID Wilburt Finlay, PA   20 Units at 01/30/12 2137  . [COMPLETED] lidocaine (XYLOCAINE) 1 % injection           . metoprolol succinate (TOPROL-XL) 24 hr tablet 100 mg  100 mg Oral Daily Wilburt Finlay, PA   100 mg at 01/30/12 0941  . [COMPLETED] midazolam (VERSED) 2 MG/2ML injection           . [COMPLETED] midazolam (VERSED) 2 MG/2ML injection           . [COMPLETED] nitroGLYCERIN (NTG ON-CALL) 0.2 mg/mL injection            . ondansetron (ZOFRAN) injection 4 mg  4 mg Intravenous Q6H PRN Chrystie Nose, MD      . oxyCODONE-acetaminophen (PERCOCET/ROXICET) 5-325 MG per tablet 1-2 tablet  1-2 tablet Oral Q4H PRN Chrystie Nose, MD      . pantoprazole (PROTONIX) EC tablet 40 mg  40 mg Oral Daily Wilburt Finlay, PA   40 mg at 01/30/12 0945  . pneumococcal 23 valent vaccine (PNU-IMMUNE) injection 0.5 mL  0.5 mL Intramuscular Tomorrow-1000 Chrystie Nose, MD      . simvastatin (ZOCOR) tablet 20 mg  20 mg Oral q1800 Wilburt Finlay, PA   20 mg at 01/30/12 1736  . Tamsulosin HCl (FLOMAX) capsule 0.4 mg  0.4 mg Oral QPC supper Wilburt Finlay, PA   0.4 mg at 01/30/12 1736  . [COMPLETED] verapamil (ISOPTIN) 2.5 MG/ML injection           . [COMPLETED] verapamil (ISOPTIN) 2.5 MG/ML injection           . [COMPLETED] verapamil (ISOPTIN) 2.5 MG/ML injection           . [DISCONTINUED] 0.9 %  sodium chloride infusion  250 mL Intravenous PRN Wilburt Finlay, PA      . [DISCONTINUED] 0.9 %  sodium chloride infusion  1 mL/kg/hr Intravenous Continuous Wilburt Finlay, PA 99.8 mL/hr at 01/29/12 1618 1 mL/kg/hr at 01/29/12 1618  . [DISCONTINUED] 0.9 %  sodium chloride infusion   Intravenous Continuous Chrystie Nose, MD      . [DISCONTINUED] sodium chloride 0.9 % injection 3 mL  3 mL Intravenous Q12H Wilburt Finlay, PA   3 mL at 01/29/12 2120  . [DISCONTINUED] sodium chloride 0.9 % injection 3 mL  3 mL Intravenous PRN Wilburt Finlay, PA        PE: General appearance: alert, cooperative and no distress Lungs: clear to auscultation bilaterally Heart: regular rate and rhythm, S1, S2 normal, no murmur, click, rub or gallop Extremities: No LEE Pulses: 2+ and symmetric DP's 1+ Skin: no errythema, ecchymosis at right wrist cath site.  Neurologic: Grossly normal  Lab Results:   Basename 01/31/12 0545 01/29/12 1531  WBC 7.4 8.4  HGB 11.6* 12.3*  HCT 35.3* 37.1*  PLT 251 272   BMET  Basename 01/31/12 0545 01/30/12 0600 01/29/12 1531  NA 137  139 140  K 4.3 4.0 4.4  CL 103 103 103  CO2 26 28 28   GLUCOSE 197* 118* 57*  BUN 19 26* 26*  CREATININE 1.59* 1.68* 1.83*  CALCIUM 8.6 9.0 9.0   PT/INR  Basename 01/30/12 0600  LABPROT 14.1  INR 1.10   Cholesterol  Basename 01/31/12 0545  CHOL 103    Assessment/Plan   Principal Problem:  *Unstable angina Active Problems:  DIABETES MELLITUS, TYPE I  HYPERTENSION  GERD  ARTHRITIS  NEPHROLITHIASIS, HX OF  Dyslipidemia  Chronic kidney disease  Plan:  S/P LHC revealing severe CAD.  CTS consult for CABG tentatively planned for 11/12.  SCr improved.  PFTs and carotid dopplers completed.  BP and HR stable.  Should be able to DC home today.    LOS: 2 days    HAGER, BRYAN 01/31/2012 9:35 AM   Patient seen and examined. Agree with assessment and plan. No chest pain. Renal function improved with hydration.  For possible DC today if surgical w/u complete with readmit on 11/12 for CABG with Dr. Tyrone Sage.   Lennette Bihari, MD, [facility] 01/31/2012 10:16 AM

## 2012-01-31 NOTE — Discharge Summary (Signed)
Physician Discharge Summary  Patient ID: CHRISTOP HIPPERT MRN: [id number] DOB/AGE: 26-Dec-1955 56 y.o.  Admit date: 01/29/2012 Discharge date: 01/31/2012  Admission Diagnoses:  Unstable angina  Discharge Diagnoses:   Principal Problem:  *Unstable angina Active Problems:  DIABETES MELLITUS, TYPE I  HYPERTENSION  GERD  ARTHRITIS  NEPHROLITHIASIS, HX OF  Dyslipidemia  Chronic kidney disease   Discharged Condition: stable  Hospital Course:   Patient is a 56 year old obese male with history of diabetes mellitus type 1, hypertension, gastroesophageal reflux disease, arthritis, nephrolithiasis, dyslipidemia, chronic kidney disease.  He has been describing unstable angina symptoms and MET-Testing revealed a low peak VO2 of 58% predicted, concerning for significant ischemia in an insulin dependent diabetic with multiple cardiac risk factors. The patient presented for a hydration the day before left heart catheterization via radial approach.    Catheterization revealedSignificant proximal to mid-LAD, calcified tubular bifurcation stenosis (High risk lesion).  Significant disease of 2 large OM vessels.  Left-dominant circulation.  CT surgery was consulted and the patient was scheduled for CABG  Tentatively on 02/06/12.   He will be discharged home today.  Lisinopril/HCTZ was DCd due to CKD.  Hydralazine was added.  He was seen by Dr. Tresa Endo who felt he was stable for DC home.    Consults: CT surgery  Significant Diagnostic Studies:  Left heart catheterization Hemodynamics:  Central Aortic Pressure / Mean Aortic Pressure: 116/68  LV Pressure / LV End diastolic Pressure: 12  Coronary Angiographic Data:  Left Main: Normal course, bifurcates normally to the LAD and LCx arteries. No significant stenosis.  Left Anterior Descending (LAD): The proximal LAD is calcified with a tubular 80% stenosis up to the D1 branch, where the LAD tapers to 95%. There is 80-90% ostial D1 stenosis at the bifurcation of  a large D1 vessel.  1st diagonal (D1): Large vessel with ostial 85-90% stenosis, calcified at the bifurcation.  Circumflex (LCx): Dominant vessel. There are 2 small proximal OM vessels which are insignificant and moderate disease of the distal LCx.  3rd obtuse marginal: Large branch with a proximal to mid-vessel 80% discrete stenosis.  4th obtuse marginal: Another large branch with a 95% ostial stenosis and a sequential discrete 80% stenosis another 10-15 mm downstream. posterior lateral branch: No significant stenosis.  Right Coronary Artery: Non-dominant vessel, ostial reflux is present during injection. Smaller vessel.  posterior descending artery: No significant stenosis.  posterior lateral branch: Bifurcates and there is moderate to severe proximal disease of both vessels. Impression:  1. Significant proximal to mid-LAD, calcified tubular bifurcation stenosis (High risk lesion).  2. Significant disease of 2 large OM vessels.  3. Left-dominant circulation.  Plan:  1. Given his complex and calcified proximal LAD / bifurcation lesion along with his large OM branch disease and left-dominant circulation, as well as insulin-dependent diabetes, he is a better candidate for coronary bypass.  2. Family has requested Dr. Tyrone Sage. I will contact [facility] for surgical evaluation.  3. Given the complication with the right radial access site and significant dye load, in the setting of CKD, will keep overnight for hydration and anticipate d/c in the morning.  The case and results was discussed with the patient (and family).  The case and results was not discussed with the patient's PCP.  The case and results was discussed with the patient's Cardiologist.  Time Spend Directly with Patient:  90 minutes  Chrystie Nose, MD, [facility]  Attending Cardiologist  [facility]  [name],[name]  01/30/2012,  2:47 PM   BMET    Component Value Date/Time   NA 137 01/31/2012 0545   K 4.3  01/31/2012 0545   CL 103 01/31/2012 0545   CO2 26 01/31/2012 0545   GLUCOSE 197* 01/31/2012 0545   BUN 19 01/31/2012 0545   CREATININE 1.59* 01/31/2012 0545   CALCIUM 8.6 01/31/2012 0545   GFRNONAA 47* 01/31/2012 0545   GFRAA 54* 01/31/2012 0545    CBC    Component Value Date/Time   WBC 7.4 01/31/2012 0545   RBC 4.17* 01/31/2012 0545   HGB 11.6* 01/31/2012 0545   HCT 35.3* 01/31/2012 0545   PLT 251 01/31/2012 0545   MCV 84.7 01/31/2012 0545   MCH 27.8 01/31/2012 0545   MCHC 32.9 01/31/2012 0545   RDW 14.0 01/31/2012 0545   LYMPHSABS 1.6 11/02/2007 2100   MONOABS 0.4 11/02/2007 2100   EOSABS 0.1 11/02/2007 2100   BASOSABS 0.0 11/02/2007 2100     Discharge Exam: Blood pressure 127/70, pulse 62, temperature 97.9 F (36.6 C), temperature source Oral, resp. rate 18, height 5\' 7"  (1.702 m), weight 100.3 kg (221 lb 1.9 oz), SpO2 93.00%.   Disposition:   Discharge Orders    Future Appointments: Provider: Department: Dept Phone: Center:   02/05/2012 2:00 PM [place] Dennie Bible [facility] SAME DAY SURGERY [phone] None   02/26/2012 1:00 PM Sherrie George, MD [facility] [phone] None   04/05/2012 9:15 AM Sherrie George, MD [facility] [phone] None       Medication List     As of 01/31/2012  2:12 PM    STOP taking these medications         lisinopril-hydrochlorothiazide 20-25 MG per tablet   Commonly known as: PRINZIDE,ZESTORETIC      TAKE these medications         amLODipine 5 MG tablet   Commonly known as: NORVASC   Take 5 mg by mouth daily.      aspirin EC 81 MG tablet   Take 81 mg by mouth daily.      cholecalciferol 1000 UNITS tablet   Commonly known as: VITAMIN D   Take 1,000 Units by mouth daily.      fenofibrate 160 MG tablet   Take 160 mg by mouth daily.      ferrous sulfate 325 (65 FE) MG tablet   Take 325 mg by mouth 2 (two) times daily.      gabapentin 300 MG capsule   Commonly known as:  NEURONTIN   Take 300 mg by mouth 3 (three) times daily.      hydrALAZINE 25 MG tablet   Commonly known as: APRESOLINE   Take 1 tablet (25 mg total) by mouth 3 (three) times daily.      insulin aspart 100 UNIT/ML injection   Commonly known as: novoLOG   Inject 10-15 Units into the skin 3 (three) times daily before meals. Per sliding scale      insulin glargine 100 UNIT/ML injection   Commonly known as: LANTUS   Inject 20 Units into the skin 2 (two) times daily.      metoprolol succinate 100 MG 24 hr tablet   Commonly known as: TOPROL-XL   Take 100 mg by mouth daily. Take with or immediately following a meal.      omeprazole 20 MG capsule   Commonly known as: PRILOSEC   Take 20 mg by mouth daily.  pravastatin 40 MG tablet   Commonly known as: PRAVACHOL   Take 40 mg by mouth daily.      sertraline 100 MG tablet   Commonly known as: ZOLOFT   Take 150 mg by mouth daily.      Tamsulosin HCl 0.4 MG Caps   Commonly known as: FLOMAX   Take 0.4 mg by mouth daily after supper.         SignedWilburt Finlay 01/31/2012, 2:12 PM

## 2012-02-05 ENCOUNTER — Encounter (HOSPITAL_COMMUNITY): Payer: Self-pay

## 2012-02-05 ENCOUNTER — Encounter (HOSPITAL_COMMUNITY)
Admit: 2012-02-05 | Discharge: 2012-02-05 | Disposition: A | Discharge status: Home / Self Care | Payer: Medicare Other | Attending: Cardiothoracic Surgery | Admitting: Cardiothoracic Surgery

## 2012-02-05 VITALS — BP 173/74 | HR 63 | Temp 98.5°F | Resp 20 | Ht 67.0 in | Wt 218.8 lb

## 2012-02-05 DIAGNOSIS — I2510 Atherosclerotic heart disease of native coronary artery without angina pectoris: Secondary | ICD-10-CM

## 2012-02-05 HISTORY — DX: Atherosclerotic heart disease of native coronary artery without angina pectoris: I25.10

## 2012-02-05 HISTORY — DX: Chronic kidney disease, stage 3 unspecified: N18.30

## 2012-02-05 HISTORY — DX: Other specified disorders of kidney and ureter: N28.89

## 2012-02-05 HISTORY — DX: Chronic kidney disease, stage 3 (moderate): N18.3

## 2012-02-05 LAB — COMPREHENSIVE METABOLIC PANEL
ALT: 16 U/L (ref 0–53)
AST: 19 U/L (ref 0–37)
Albumin: 3.9 g/dL (ref 3.5–5.2)
Alkaline Phosphatase: 47 U/L (ref 39–117)
BUN: 28 mg/dL — ABNORMAL HIGH (ref 6–23)
CO2: 26 mEq/L (ref 19–32)
Calcium: 9.2 mg/dL (ref 8.4–10.5)
Chloride: 103 mEq/L (ref 96–112)
Creatinine, Ser: 1.7 mg/dL — ABNORMAL HIGH (ref 0.50–1.35)
GFR calc Af Amer: 50 mL/min — ABNORMAL LOW (ref >90)
GFR calc non Af Amer: 43 mL/min — ABNORMAL LOW (ref >90)
Glucose, Bld: 107 mg/dL — ABNORMAL HIGH (ref 70–99)
Potassium: 4.3 mEq/L (ref 3.5–5.1)
Sodium: 139 mEq/L (ref 135–145)
Total Bilirubin: 0.3 mg/dL (ref 0.3–1.2)
Total Protein: 7.3 g/dL (ref 6.0–8.3)

## 2012-02-05 LAB — URINALYSIS, ROUTINE W REFLEX MICROSCOPIC
Bilirubin Urine: NEGATIVE
Glucose, UA: NEGATIVE mg/dL
Hgb urine dipstick: NEGATIVE
Ketones, ur: NEGATIVE mg/dL
Leukocytes, UA: NEGATIVE
Nitrite: NEGATIVE
Protein, ur: NEGATIVE mg/dL
Specific Gravity, Urine: 1.028 (ref 1.005–1.030)
Urobilinogen, UA: 2 mg/dL — ABNORMAL HIGH (ref 0.0–1.0)
pH: 6 (ref 5.0–8.0)

## 2012-02-05 LAB — BLOOD GAS, ARTERIAL
Acid-Base Excess: 0.2 mmol/L (ref 0.0–2.0)
Bicarbonate: 24.2 mEq/L — ABNORMAL HIGH (ref 20.0–24.0)
Drawn by: 139
FIO2: 0.21 %
O2 Saturation: 97.1 %
Patient temperature: 98.6
TCO2: 25.4 mmol/L (ref 0–100)
pCO2 arterial: 38.2 mmHg (ref 35.0–45.0)
pH, Arterial: 7.418 (ref 7.350–7.450)
pO2, Arterial: 92.5 mmHg (ref 80.0–100.0)

## 2012-02-05 LAB — CBC
HCT: 35.5 % — ABNORMAL LOW (ref 39.0–52.0)
Hemoglobin: 11.8 g/dL — ABNORMAL LOW (ref 13.0–17.0)
MCH: 28.2 pg (ref 26.0–34.0)
MCHC: 33.2 g/dL (ref 30.0–36.0)
MCV: 84.7 fL (ref 78.0–100.0)
Platelets: 294 10*3/uL (ref 150–400)
RBC: 4.19 MIL/uL — ABNORMAL LOW (ref 4.22–5.81)
RDW: 14.1 % (ref 11.5–15.5)
WBC: 5.9 10*3/uL (ref 4.0–10.5)

## 2012-02-05 LAB — APTT: aPTT: 31 seconds (ref 24–37)

## 2012-02-05 LAB — PROTIME-INR
INR: 1.01 (ref 0.00–1.49)
Prothrombin Time: 13.2 seconds (ref 11.6–15.2)

## 2012-02-05 LAB — SURGICAL PCR SCREEN
MRSA, PCR: NEGATIVE
Staphylococcus aureus: POSITIVE — AB

## 2012-02-05 MED ORDER — DOPAMINE-DEXTROSE 3.2-5 MG/ML-% IV SOLN
2.0000 ug/kg/min | INTRAVENOUS | Status: AC
  Administered 2012-02-06: 3 ug/kg/min via INTRAVENOUS
  Filled 2012-02-05: qty 250

## 2012-02-05 MED ORDER — SODIUM CHLORIDE 0.9 % IV SOLN
INTRAVENOUS | Status: AC
  Administered 2012-02-06: 70 mL/h via INTRAVENOUS
  Filled 2012-02-05: qty 40

## 2012-02-05 MED ORDER — POTASSIUM CHLORIDE 2 MEQ/ML IV SOLN
80.0000 meq | INTRAVENOUS | Status: DC
  Filled 2012-02-05: qty 40

## 2012-02-05 MED ORDER — METOPROLOL TARTRATE 12.5 MG HALF TABLET: 12.5000 mg | ORAL_TABLET | Freq: Once | ORAL | Status: DC

## 2012-02-05 MED ORDER — PLASMA-LYTE 148 IV SOLN
INTRAVENOUS | Status: AC
  Administered 2012-02-06: 09:00:00
  Filled 2012-02-05: qty 2.5

## 2012-02-05 MED ORDER — VANCOMYCIN HCL 1000 MG IV SOLR
1500.0000 mg | INTRAVENOUS | Status: AC
  Administered 2012-02-06: 1500 mg via INTRAVENOUS
  Filled 2012-02-05 (×2): qty 1500

## 2012-02-05 MED ORDER — SODIUM CHLORIDE 0.9 % IV SOLN
INTRAVENOUS | Status: AC
  Administered 2012-02-06: 3.2 [IU]/h via INTRAVENOUS
  Filled 2012-02-05: qty 1

## 2012-02-05 MED ORDER — CHLORHEXIDINE GLUCONATE 4 % EX LIQD: 30.0000 mL | CUTANEOUS | Status: DC

## 2012-02-05 MED ORDER — DEXTROSE 5 % IV SOLN
0.5000 ug/min | INTRAVENOUS | Status: DC
  Filled 2012-02-05: qty 4

## 2012-02-05 MED ORDER — NITROGLYCERIN IN D5W 200-5 MCG/ML-% IV SOLN
2.0000 ug/min | INTRAVENOUS | Status: AC
  Administered 2012-02-06: 5 ug/min via INTRAVENOUS
  Filled 2012-02-05: qty 250

## 2012-02-05 MED ORDER — PHENYLEPHRINE HCL 10 MG/ML IJ SOLN
30.0000 ug/min | INTRAVENOUS | Status: AC
  Administered 2012-02-06: 10 ug/min via INTRAVENOUS
  Filled 2012-02-05: qty 2

## 2012-02-05 MED ORDER — DEXMEDETOMIDINE HCL IN NACL 400 MCG/100ML IV SOLN
0.1000 ug/kg/h | INTRAVENOUS | Status: AC
  Administered 2012-02-06: 0.2 ug/kg/h via INTRAVENOUS
  Filled 2012-02-05: qty 100

## 2012-02-05 MED ORDER — DEXTROSE 5 % IV SOLN
1.5000 g | INTRAVENOUS | Status: AC
  Administered 2012-02-06: 1.5 g via INTRAVENOUS
  Administered 2012-02-06: .75 g via INTRAVENOUS
  Filled 2012-02-05 (×2): qty 1.5

## 2012-02-05 MED ORDER — DEXTROSE 5 % IV SOLN
750.0000 mg | INTRAVENOUS | Status: DC
  Filled 2012-02-05: qty 750

## 2012-02-05 MED ORDER — MAGNESIUM SULFATE 50 % IJ SOLN
40.0000 meq | INTRAMUSCULAR | Status: DC
  Filled 2012-02-05: qty 10

## 2012-02-05 NOTE — Pre-Procedure Instructions (Signed)
20 Joseph [name]  02/05/2012   Your procedure is scheduled on:  Tuesday, November 12  Report to [facility] at 0530 AM.  Call this number if you have problems the morning of surgery: [phone]   Remember:   Do not eat food or drink liquids:After Midnight. Tonight              Take 1/2 Lantus  insulin dose tonight with a snack at bedtime      Take these medicines the morning of surgery with A SIP OF WATER: Amlodipine,Hydralazine,Prilosec,Zoloft   Do not wear jewelry, make-up or nail polish.  Do not wear lotions, powders, or perfumes. You may wear deodorant.  Do not shave 48 hours prior to surgery. Men may shave face and neck.  Do not bring valuables to the hospital.  Contacts, dentures or bridgework may not be worn into surgery.  Leave suitcase in the car. After surgery it may be brought to your room.  For patients admitted to the hospital, checkout time is 11:00 AM the day of discharge.   Special Instructions: Incentive Spirometry - Practice and bring it with you on the day of surgery. Shower using CHG 2 nights before surgery and the night before surgery.  If you shower the day of surgery use CHG.  Use special wash - you have one bottle of CHG for all showers.  You should use approximately 1/3 of the bottle for each shower.   Please read over the following fact sheets that you were given: Pain Booklet, Coughing and Deep Breathing, Blood Transfusion Information, Open Heart Packet, MRSA Information and Surgical Site Infection Prevention

## 2012-02-05 NOTE — Progress Notes (Signed)
Notified Dr. Michelle Piper of creatinine 1.7. Stated should be fine for surgery tomorrow.

## 2012-02-06 ENCOUNTER — Inpatient Hospital Stay (HOSPITAL_COMMUNITY)
Admission: RE | Admit: 2012-02-06 | Discharge: 2012-02-12 | DRG: 236 | Disposition: A | Discharge status: Home Health | Payer: Medicare Other | Source: Ambulatory Visit | Attending: Cardiothoracic Surgery | Admitting: Cardiothoracic Surgery

## 2012-02-06 ENCOUNTER — Encounter (HOSPITAL_COMMUNITY)
Admission: RE | Disposition: A | Discharge status: Home / Self Care | Payer: Self-pay | Source: Ambulatory Visit | Attending: Cardiothoracic Surgery

## 2012-02-06 ENCOUNTER — Encounter (HOSPITAL_COMMUNITY): Payer: Self-pay | Admitting: Anesthesiology

## 2012-02-06 ENCOUNTER — Encounter (HOSPITAL_COMMUNITY): Payer: Self-pay | Admitting: *Deleted

## 2012-02-06 ENCOUNTER — Inpatient Hospital Stay (HOSPITAL_COMMUNITY): Payer: Medicare Other | Admitting: Anesthesiology

## 2012-02-06 ENCOUNTER — Inpatient Hospital Stay (HOSPITAL_COMMUNITY): Payer: Medicare Other

## 2012-02-06 DIAGNOSIS — Z833 Family history of diabetes mellitus: Secondary | ICD-10-CM

## 2012-02-06 DIAGNOSIS — I2510 Atherosclerotic heart disease of native coronary artery without angina pectoris: Secondary | ICD-10-CM

## 2012-02-06 DIAGNOSIS — E109 Type 1 diabetes mellitus without complications: Secondary | ICD-10-CM | POA: Diagnosis present

## 2012-02-06 DIAGNOSIS — Z951 Presence of aortocoronary bypass graft: Secondary | ICD-10-CM

## 2012-02-06 DIAGNOSIS — E108 Type 1 diabetes mellitus with unspecified complications: Secondary | ICD-10-CM | POA: Diagnosis present

## 2012-02-06 DIAGNOSIS — Z794 Long term (current) use of insulin: Secondary | ICD-10-CM

## 2012-02-06 DIAGNOSIS — R931 Abnormal findings on diagnostic imaging of heart and coronary circulation: Secondary | ICD-10-CM | POA: Diagnosis present

## 2012-02-06 DIAGNOSIS — I129 Hypertensive chronic kidney disease with stage 1 through stage 4 chronic kidney disease, or unspecified chronic kidney disease: Secondary | ICD-10-CM | POA: Diagnosis present

## 2012-02-06 DIAGNOSIS — N182 Chronic kidney disease, stage 2 (mild): Secondary | ICD-10-CM | POA: Diagnosis present

## 2012-02-06 DIAGNOSIS — K219 Gastro-esophageal reflux disease without esophagitis: Secondary | ICD-10-CM | POA: Diagnosis present

## 2012-02-06 DIAGNOSIS — Z87442 Personal history of urinary calculi: Secondary | ICD-10-CM

## 2012-02-06 DIAGNOSIS — Z01812 Encounter for preprocedural laboratory examination: Secondary | ICD-10-CM

## 2012-02-06 DIAGNOSIS — E1159 Type 2 diabetes mellitus with other circulatory complications: Secondary | ICD-10-CM | POA: Diagnosis present

## 2012-02-06 DIAGNOSIS — Z7982 Long term (current) use of aspirin: Secondary | ICD-10-CM

## 2012-02-06 DIAGNOSIS — F3289 Other specified depressive episodes: Secondary | ICD-10-CM | POA: Diagnosis present

## 2012-02-06 DIAGNOSIS — G4730 Sleep apnea, unspecified: Secondary | ICD-10-CM | POA: Diagnosis present

## 2012-02-06 DIAGNOSIS — F329 Major depressive disorder, single episode, unspecified: Secondary | ICD-10-CM | POA: Diagnosis present

## 2012-02-06 DIAGNOSIS — Z79899 Other long term (current) drug therapy: Secondary | ICD-10-CM

## 2012-02-06 DIAGNOSIS — D62 Acute posthemorrhagic anemia: Secondary | ICD-10-CM | POA: Diagnosis not present

## 2012-02-06 DIAGNOSIS — I200 Unstable angina: Secondary | ICD-10-CM | POA: Diagnosis present

## 2012-02-06 DIAGNOSIS — E8779 Other fluid overload: Secondary | ICD-10-CM | POA: Diagnosis not present

## 2012-02-06 DIAGNOSIS — I10 Essential (primary) hypertension: Secondary | ICD-10-CM | POA: Diagnosis present

## 2012-02-06 DIAGNOSIS — N183 Chronic kidney disease, stage 3 unspecified: Secondary | ICD-10-CM | POA: Diagnosis present

## 2012-02-06 HISTORY — PX: CORONARY ARTERY BYPASS GRAFT: SHX141

## 2012-02-06 LAB — POCT I-STAT 3, ART BLOOD GAS (G3+)
Acid-base deficit: 2 mmol/L (ref 0.0–2.0)
Acid-base deficit: 2 mmol/L (ref 0.0–2.0)
Acid-base deficit: 1 mmol/L (ref 0.0–2.0)
Acid-base deficit: 3 mmol/L — ABNORMAL HIGH (ref 0.0–2.0)
Acid-base deficit: 2 mmol/L (ref 0.0–2.0)
Bicarbonate: 23.4 mEq/L (ref 20.0–24.0)
Bicarbonate: 23.9 mEq/L (ref 20.0–24.0)
Bicarbonate: 24.6 mEq/L — ABNORMAL HIGH (ref 20.0–24.0)
Bicarbonate: 22.2 mEq/L (ref 20.0–24.0)
Bicarbonate: 23.3 mEq/L (ref 20.0–24.0)
O2 Saturation: 100 %
O2 Saturation: 99 %
O2 Saturation: 91 %
O2 Saturation: 96 %
O2 Saturation: 96 %
Patient temperature: 36.5
Patient temperature: 36.6
Patient temperature: 36.9
TCO2: 25 mmol/L (ref 0–100)
TCO2: 25 mmol/L (ref 0–100)
TCO2: 26 mmol/L (ref 0–100)
TCO2: 23 mmol/L (ref 0–100)
TCO2: 25 mmol/L (ref 0–100)
pCO2 arterial: 42.7 mmHg (ref 35.0–45.0)
pCO2 arterial: 46 mmHg — ABNORMAL HIGH (ref 35.0–45.0)
pCO2 arterial: 42.6 mmHg (ref 35.0–45.0)
pCO2 arterial: 36.3 mmHg (ref 35.0–45.0)
pCO2 arterial: 40.9 mmHg (ref 35.0–45.0)
pH, Arterial: 7.346 — ABNORMAL LOW (ref 7.350–7.450)
pH, Arterial: 7.324 — ABNORMAL LOW (ref 7.350–7.450)
pH, Arterial: 7.367 (ref 7.350–7.450)
pH, Arterial: 7.392 (ref 7.350–7.450)
pH, Arterial: 7.363 (ref 7.350–7.450)
pO2, Arterial: 255 mmHg — ABNORMAL HIGH (ref 80.0–100.0)
pO2, Arterial: 165 mmHg — ABNORMAL HIGH (ref 80.0–100.0)
pO2, Arterial: 62 mmHg — ABNORMAL LOW (ref 80.0–100.0)
pO2, Arterial: 80 mmHg (ref 80.0–100.0)
pO2, Arterial: 85 mmHg (ref 80.0–100.0)

## 2012-02-06 LAB — POCT I-STAT 4, (NA,K, GLUC, HGB,HCT)
Glucose, Bld: 167 mg/dL — ABNORMAL HIGH (ref 70–99)
Glucose, Bld: 166 mg/dL — ABNORMAL HIGH (ref 70–99)
Glucose, Bld: 138 mg/dL — ABNORMAL HIGH (ref 70–99)
Glucose, Bld: 117 mg/dL — ABNORMAL HIGH (ref 70–99)
Glucose, Bld: 145 mg/dL — ABNORMAL HIGH (ref 70–99)
Glucose, Bld: 125 mg/dL — ABNORMAL HIGH (ref 70–99)
HCT: 32 % — ABNORMAL LOW (ref 39.0–52.0)
HCT: 31 % — ABNORMAL LOW (ref 39.0–52.0)
HCT: 23 % — ABNORMAL LOW (ref 39.0–52.0)
HCT: 21 % — ABNORMAL LOW (ref 39.0–52.0)
HCT: 25 % — ABNORMAL LOW (ref 39.0–52.0)
HCT: 33 % — ABNORMAL LOW (ref 39.0–52.0)
Hemoglobin: 10.9 g/dL — ABNORMAL LOW (ref 13.0–17.0)
Hemoglobin: 10.5 g/dL — ABNORMAL LOW (ref 13.0–17.0)
Hemoglobin: 7.8 g/dL — ABNORMAL LOW (ref 13.0–17.0)
Hemoglobin: 7.1 g/dL — ABNORMAL LOW (ref 13.0–17.0)
Hemoglobin: 8.5 g/dL — ABNORMAL LOW (ref 13.0–17.0)
Hemoglobin: 11.2 g/dL — ABNORMAL LOW (ref 13.0–17.0)
Potassium: 4.4 mEq/L (ref 3.5–5.1)
Potassium: 4.3 mEq/L (ref 3.5–5.1)
Potassium: 4.2 mEq/L (ref 3.5–5.1)
Potassium: 5.6 mEq/L — ABNORMAL HIGH (ref 3.5–5.1)
Potassium: 4.5 mEq/L (ref 3.5–5.1)
Potassium: 3.9 mEq/L (ref 3.5–5.1)
Sodium: 139 mEq/L (ref 135–145)
Sodium: 140 mEq/L (ref 135–145)
Sodium: 136 mEq/L (ref 135–145)
Sodium: 136 mEq/L (ref 135–145)
Sodium: 137 mEq/L (ref 135–145)
Sodium: 138 mEq/L (ref 135–145)

## 2012-02-06 LAB — POCT I-STAT, CHEM 8
BUN: 24 mg/dL — ABNORMAL HIGH (ref 6–23)
Calcium, Ion: 1.08 mmol/L — ABNORMAL LOW (ref 1.12–1.23)
Chloride: 107 mEq/L (ref 96–112)
Creatinine, Ser: 1.6 mg/dL — ABNORMAL HIGH (ref 0.50–1.35)
Glucose, Bld: 106 mg/dL — ABNORMAL HIGH (ref 70–99)
HCT: 32 % — ABNORMAL LOW (ref 39.0–52.0)
Hemoglobin: 10.9 g/dL — ABNORMAL LOW (ref 13.0–17.0)
Potassium: 4.4 mEq/L (ref 3.5–5.1)
Sodium: 142 mEq/L (ref 135–145)
TCO2: 22 mmol/L (ref 0–100)

## 2012-02-06 LAB — PLATELET COUNT: Platelets: 156 10*3/uL (ref 150–400)

## 2012-02-06 LAB — POCT I-STAT GLUCOSE
Glucose, Bld: 125 mg/dL — ABNORMAL HIGH (ref 70–99)
Operator id: 3406

## 2012-02-06 LAB — APTT: aPTT: 34 seconds (ref 24–37)

## 2012-02-06 LAB — GLUCOSE, CAPILLARY: Glucose-Capillary: 167 mg/dL — ABNORMAL HIGH (ref 70–99)

## 2012-02-06 LAB — PROTIME-INR
INR: 1.43 (ref 0.00–1.49)
Prothrombin Time: 17.1 seconds — ABNORMAL HIGH (ref 11.6–15.2)

## 2012-02-06 LAB — HEMOGLOBIN AND HEMATOCRIT, BLOOD
HCT: 20.7 % — ABNORMAL LOW (ref 39.0–52.0)
Hemoglobin: 7.1 g/dL — ABNORMAL LOW (ref 13.0–17.0)

## 2012-02-06 LAB — HEMOGLOBIN A1C
Hgb A1c MFr Bld: 6.7 % — ABNORMAL HIGH (ref <5.7)
Mean Plasma Glucose: 146 mg/dL — ABNORMAL HIGH (ref <117)

## 2012-02-06 SURGERY — CORONARY ARTERY BYPASS GRAFTING (CABG)
Anesthesia: General | Site: Chest | Wound class: Clean

## 2012-02-06 MED ORDER — INSULIN ASPART 100 UNIT/ML ~~LOC~~ SOLN
0.0000 [IU] | SUBCUTANEOUS | Status: DC
  Administered 2012-02-07: 2 [IU] via SUBCUTANEOUS
  Administered 2012-02-07: 4 [IU] via SUBCUTANEOUS

## 2012-02-06 MED ORDER — ASPIRIN EC 325 MG PO TBEC
325.0000 mg | DELAYED_RELEASE_TABLET | Freq: Every day | ORAL | Status: DC
  Administered 2012-02-08: 325 mg via ORAL
  Filled 2012-02-06 (×2): qty 1

## 2012-02-06 MED ORDER — SODIUM CHLORIDE 0.9 % IV SOLN: 250.0000 mL | INTRAVENOUS | Status: DC

## 2012-02-06 MED ORDER — FENOFIBRATE 160 MG PO TABS
160.0000 mg | ORAL_TABLET | Freq: Every day | ORAL | Status: DC
  Administered 2012-02-08 – 2012-02-12 (×5): 160 mg via ORAL
  Filled 2012-02-06 (×7): qty 1

## 2012-02-06 MED ORDER — ASPIRIN 81 MG PO CHEW: 324.0000 mg | CHEWABLE_TABLET | Freq: Every day | ORAL | Status: DC

## 2012-02-06 MED ORDER — VANCOMYCIN HCL IN DEXTROSE 1-5 GM/200ML-% IV SOLN
1000.0000 mg | Freq: Once | INTRAVENOUS | Status: AC
  Administered 2012-02-06: 1000 mg via INTRAVENOUS
  Filled 2012-02-06: qty 200

## 2012-02-06 MED ORDER — ACETAMINOPHEN 160 MG/5ML PO SOLN
975.0000 mg | Freq: Four times a day (QID) | ORAL | Status: DC
  Filled 2012-02-06: qty 40.6

## 2012-02-06 MED ORDER — BISACODYL 10 MG RE SUPP: 10.0000 mg | Freq: Every day | RECTAL | Status: DC

## 2012-02-06 MED ORDER — PROPOFOL 10 MG/ML IV BOLUS
INTRAVENOUS | Status: DC | PRN
  Administered 2012-02-06: 70 mg via INTRAVENOUS

## 2012-02-06 MED ORDER — OXYCODONE HCL 5 MG PO TABS
5.0000 mg | ORAL_TABLET | ORAL | Status: DC | PRN
  Administered 2012-02-07: 5 mg via ORAL
  Administered 2012-02-08 (×2): 10 mg via ORAL
  Filled 2012-02-06: qty 2
  Filled 2012-02-06: qty 1
  Filled 2012-02-06: qty 2

## 2012-02-06 MED ORDER — SUFENTANIL CITRATE 50 MCG/ML IV SOLN
INTRAVENOUS | Status: DC | PRN
  Administered 2012-02-06: 20 ug via INTRAVENOUS
  Administered 2012-02-06: 30 ug via INTRAVENOUS
  Administered 2012-02-06: 10 ug via INTRAVENOUS
  Administered 2012-02-06: 20 ug via INTRAVENOUS
  Administered 2012-02-06: 40 ug via INTRAVENOUS
  Administered 2012-02-06: 20 ug via INTRAVENOUS
  Administered 2012-02-06: 30 ug via INTRAVENOUS
  Administered 2012-02-06: 20 ug via INTRAVENOUS

## 2012-02-06 MED ORDER — SODIUM CHLORIDE 0.9 % IV SOLN
INTRAVENOUS | Status: DC
  Administered 2012-02-08: via INTRAVENOUS

## 2012-02-06 MED ORDER — SERTRALINE HCL 50 MG PO TABS
150.0000 mg | ORAL_TABLET | Freq: Every day | ORAL | Status: DC
  Administered 2012-02-07 – 2012-02-12 (×6): 150 mg via ORAL
  Filled 2012-02-06 (×6): qty 1

## 2012-02-06 MED ORDER — PROTAMINE SULFATE 10 MG/ML IV SOLN
INTRAVENOUS | Status: DC | PRN
  Administered 2012-02-06: 60 mg via INTRAVENOUS
  Administered 2012-02-06: 120 mg via INTRAVENOUS
  Administered 2012-02-06: 50 mg via INTRAVENOUS
  Administered 2012-02-06: 20 mg via INTRAVENOUS

## 2012-02-06 MED ORDER — MUPIROCIN 2 % EX OINT
1.0000 "application " | TOPICAL_OINTMENT | Freq: Two times a day (BID) | CUTANEOUS | Status: AC
  Administered 2012-02-06 – 2012-02-11 (×10): 1 via NASAL
  Filled 2012-02-06: qty 22

## 2012-02-06 MED ORDER — ALBUMIN HUMAN 5 % IV SOLN
250.0000 mL | INTRAVENOUS | Status: AC | PRN
  Administered 2012-02-06: 250 mL via INTRAVENOUS

## 2012-02-06 MED ORDER — MIDAZOLAM HCL 5 MG/5ML IJ SOLN
INTRAMUSCULAR | Status: DC | PRN
  Administered 2012-02-06: 2 mg via INTRAVENOUS
  Administered 2012-02-06: 5 mg via INTRAVENOUS
  Administered 2012-02-06: 3 mg via INTRAVENOUS

## 2012-02-06 MED ORDER — SODIUM CHLORIDE 0.9 % IJ SOLN
3.0000 mL | Freq: Two times a day (BID) | INTRAMUSCULAR | Status: DC
  Administered 2012-02-07 – 2012-02-08 (×2): 3 mL via INTRAVENOUS

## 2012-02-06 MED ORDER — LACTATED RINGERS IV SOLN
INTRAVENOUS | Status: DC | PRN
  Administered 2012-02-06: 07:00:00 via INTRAVENOUS

## 2012-02-06 MED ORDER — PHENYLEPHRINE HCL 10 MG/ML IJ SOLN
0.0000 ug/min | INTRAVENOUS | Status: DC
  Filled 2012-02-06: qty 2

## 2012-02-06 MED ORDER — SODIUM CHLORIDE 0.45 % IV SOLN
INTRAVENOUS | Status: DC
  Administered 2012-02-06: 14:00:00 via INTRAVENOUS

## 2012-02-06 MED ORDER — ONDANSETRON HCL 4 MG/2ML IJ SOLN
4.0000 mg | Freq: Four times a day (QID) | INTRAMUSCULAR | Status: DC | PRN
  Administered 2012-02-06 – 2012-02-08 (×5): 4 mg via INTRAVENOUS
  Filled 2012-02-06 (×5): qty 2

## 2012-02-06 MED ORDER — LACTATED RINGERS IV SOLN: 500.0000 mL | Freq: Once | INTRAVENOUS | Status: AC | PRN

## 2012-02-06 MED ORDER — NITROGLYCERIN IN D5W 200-5 MCG/ML-% IV SOLN: 0.0000 ug/min | INTRAVENOUS | Status: DC

## 2012-02-06 MED ORDER — MIDAZOLAM HCL 2 MG/2ML IJ SOLN: 2.0000 mg | INTRAMUSCULAR | Status: DC | PRN

## 2012-02-06 MED ORDER — SODIUM CHLORIDE 0.9 % IJ SOLN
OROMUCOSAL | Status: DC | PRN
  Administered 2012-02-06 (×3): via TOPICAL

## 2012-02-06 MED ORDER — INSULIN ASPART 100 UNIT/ML ~~LOC~~ SOLN: 0.0000 [IU] | SUBCUTANEOUS | Status: DC

## 2012-02-06 MED ORDER — ACETAMINOPHEN 10 MG/ML IV SOLN
1000.0000 mg | Freq: Once | INTRAVENOUS | Status: AC
  Administered 2012-02-06: 1000 mg via INTRAVENOUS
  Filled 2012-02-06: qty 100

## 2012-02-06 MED ORDER — FAMOTIDINE IN NACL 20-0.9 MG/50ML-% IV SOLN
20.0000 mg | Freq: Two times a day (BID) | INTRAVENOUS | Status: AC
  Administered 2012-02-06: 20 mg via INTRAVENOUS

## 2012-02-06 MED ORDER — TAMSULOSIN HCL 0.4 MG PO CAPS
0.4000 mg | ORAL_CAPSULE | Freq: Every day | ORAL | Status: DC
  Filled 2012-02-06 (×2): qty 1

## 2012-02-06 MED ORDER — METOPROLOL TARTRATE 1 MG/ML IV SOLN
2.5000 mg | INTRAVENOUS | Status: DC | PRN
  Administered 2012-02-07: 5 mg via INTRAVENOUS

## 2012-02-06 MED ORDER — SODIUM CHLORIDE 0.9 % IV SOLN
INTRAVENOUS | Status: DC | PRN
  Filled 2012-02-06: qty 1

## 2012-02-06 MED ORDER — VITAMIN D3 25 MCG (1000 UNIT) PO TABS
1000.0000 [IU] | ORAL_TABLET | Freq: Every day | ORAL | Status: DC
  Administered 2012-02-08 – 2012-02-12 (×5): 1000 [IU] via ORAL
  Filled 2012-02-06 (×7): qty 1

## 2012-02-06 MED ORDER — MUPIROCIN 2 % EX OINT
TOPICAL_OINTMENT | CUTANEOUS | Status: AC
  Administered 2012-02-06: 1 via NASAL
  Filled 2012-02-06: qty 22

## 2012-02-06 MED ORDER — SIMVASTATIN 20 MG PO TABS
20.0000 mg | ORAL_TABLET | Freq: Every day | ORAL | Status: DC
  Administered 2012-02-07 – 2012-02-11 (×5): 20 mg via ORAL
  Filled 2012-02-06 (×7): qty 1

## 2012-02-06 MED ORDER — FENTANYL CITRATE 0.05 MG/ML IJ SOLN
50.0000 ug | INTRAMUSCULAR | Status: DC | PRN
  Administered 2012-02-06: 50 ug via INTRAVENOUS
  Administered 2012-02-07 (×2): 100 ug via INTRAVENOUS
  Administered 2012-02-07 (×4): 50 ug via INTRAVENOUS
  Administered 2012-02-08: 100 ug via INTRAVENOUS
  Filled 2012-02-06 (×6): qty 2

## 2012-02-06 MED ORDER — ARTIFICIAL TEARS OP OINT
TOPICAL_OINTMENT | OPHTHALMIC | Status: DC | PRN
  Administered 2012-02-06: 1 via OPHTHALMIC

## 2012-02-06 MED ORDER — POTASSIUM CHLORIDE 10 MEQ/50ML IV SOLN: 10.0000 meq | INTRAVENOUS | Status: AC

## 2012-02-06 MED ORDER — SODIUM CHLORIDE 0.9 % IJ SOLN: 3.0000 mL | INTRAMUSCULAR | Status: DC | PRN

## 2012-02-06 MED ORDER — MORPHINE SULFATE 2 MG/ML IJ SOLN: 2.0000 mg | INTRAMUSCULAR | Status: DC | PRN

## 2012-02-06 MED ORDER — MORPHINE SULFATE 2 MG/ML IJ SOLN
1.0000 mg | INTRAMUSCULAR | Status: DC | PRN
Start: 2012-02-06 — End: 2012-02-06
  Administered 2012-02-06: 1 mg via INTRAVENOUS
  Administered 2012-02-06: 2 mg via INTRAVENOUS
  Administered 2012-02-06: 1 mg via INTRAVENOUS
  Filled 2012-02-06 (×2): qty 1

## 2012-02-06 MED ORDER — DOPAMINE-DEXTROSE 3.2-5 MG/ML-% IV SOLN: 2.5000 ug/kg/min | INTRAVENOUS | Status: DC

## 2012-02-06 MED ORDER — GABAPENTIN 300 MG PO CAPS
300.0000 mg | ORAL_CAPSULE | Freq: Three times a day (TID) | ORAL | Status: DC
  Administered 2012-02-07 – 2012-02-12 (×16): 300 mg via ORAL
  Filled 2012-02-06 (×20): qty 1

## 2012-02-06 MED ORDER — DEXTROSE 5 % IV SOLN
1.5000 g | Freq: Two times a day (BID) | INTRAVENOUS | Status: AC
  Administered 2012-02-06 – 2012-02-08 (×4): 1.5 g via INTRAVENOUS
  Filled 2012-02-06 (×4): qty 1.5

## 2012-02-06 MED ORDER — INSULIN GLARGINE 100 UNIT/ML ~~LOC~~ SOLN: 20.0000 [IU] | Freq: Every day | SUBCUTANEOUS | Status: DC

## 2012-02-06 MED ORDER — CHLORHEXIDINE GLUCONATE CLOTH 2 % EX PADS
6.0000 | MEDICATED_PAD | Freq: Every day | CUTANEOUS | Status: DC
  Administered 2012-02-07 – 2012-02-08 (×2): 6 via TOPICAL

## 2012-02-06 MED ORDER — MAGNESIUM SULFATE 40 MG/ML IJ SOLN: 4.0000 g | Freq: Once | INTRAMUSCULAR | Status: DC

## 2012-02-06 MED ORDER — BISACODYL 5 MG PO TBEC
10.0000 mg | DELAYED_RELEASE_TABLET | Freq: Every day | ORAL | Status: DC
  Administered 2012-02-08: 10 mg via ORAL
  Filled 2012-02-06 (×2): qty 2

## 2012-02-06 MED ORDER — INSULIN ASPART 100 UNIT/ML ~~LOC~~ SOLN
0.0000 [IU] | SUBCUTANEOUS | Status: AC
  Administered 2012-02-06: 2 [IU] via SUBCUTANEOUS
  Administered 2012-02-07: 4 [IU] via SUBCUTANEOUS

## 2012-02-06 MED ORDER — PANTOPRAZOLE SODIUM 40 MG PO TBEC
40.0000 mg | DELAYED_RELEASE_TABLET | Freq: Every day | ORAL | Status: DC
  Administered 2012-02-08: 40 mg via ORAL
  Filled 2012-02-06: qty 1

## 2012-02-06 MED ORDER — HEMOSTATIC AGENTS (NO CHARGE) OPTIME
TOPICAL | Status: DC | PRN
  Administered 2012-02-06: 1 via TOPICAL

## 2012-02-06 MED ORDER — ACETAMINOPHEN 500 MG PO TABS
1000.0000 mg | ORAL_TABLET | Freq: Four times a day (QID) | ORAL | Status: DC
  Administered 2012-02-07 – 2012-02-08 (×4): 1000 mg via ORAL
  Filled 2012-02-06 (×8): qty 2

## 2012-02-06 MED ORDER — INSULIN REGULAR BOLUS VIA INFUSION
0.0000 [IU] | Freq: Three times a day (TID) | INTRAVENOUS | Status: DC
  Filled 2012-02-06: qty 10

## 2012-02-06 MED ORDER — METOCLOPRAMIDE HCL 5 MG/ML IJ SOLN
10.0000 mg | Freq: Four times a day (QID) | INTRAMUSCULAR | Status: AC
  Administered 2012-02-06 – 2012-02-07 (×4): 10 mg via INTRAVENOUS
  Filled 2012-02-06 (×4): qty 2

## 2012-02-06 MED ORDER — SODIUM CHLORIDE 0.9 % IV SOLN
INTRAVENOUS | Status: DC
  Filled 2012-02-06: qty 1

## 2012-02-06 MED ORDER — DEXMEDETOMIDINE HCL IN NACL 200 MCG/50ML IV SOLN: 0.1000 ug/kg/h | INTRAVENOUS | Status: DC

## 2012-02-06 MED ORDER — LACTATED RINGERS IV SOLN
INTRAVENOUS | Status: DC
Start: 2012-02-06 — End: 2012-02-08
  Administered 2012-02-06: 19:00:00 via INTRAVENOUS

## 2012-02-06 MED ORDER — VECURONIUM BROMIDE 10 MG IV SOLR
INTRAVENOUS | Status: DC | PRN
  Administered 2012-02-06: 3 mg via INTRAVENOUS
  Administered 2012-02-06: 7 mg via INTRAVENOUS
  Administered 2012-02-06 (×2): 3 mg via INTRAVENOUS

## 2012-02-06 MED ORDER — METOPROLOL TARTRATE 25 MG/10 ML ORAL SUSPENSION
12.5000 mg | Freq: Two times a day (BID) | ORAL | Status: DC
  Administered 2012-02-07: 12.5 mg
  Filled 2012-02-06 (×5): qty 5

## 2012-02-06 MED ORDER — HEPARIN SODIUM (PORCINE) 1000 UNIT/ML IJ SOLN
INTRAMUSCULAR | Status: DC | PRN
  Administered 2012-02-06: 29000 [IU] via INTRAVENOUS

## 2012-02-06 MED ORDER — METOPROLOL TARTRATE 12.5 MG HALF TABLET
12.5000 mg | ORAL_TABLET | Freq: Two times a day (BID) | ORAL | Status: DC
  Administered 2012-02-07 – 2012-02-08 (×2): 12.5 mg via ORAL
  Filled 2012-02-06 (×5): qty 1

## 2012-02-06 MED ORDER — DOCUSATE SODIUM 100 MG PO CAPS
200.0000 mg | ORAL_CAPSULE | Freq: Every day | ORAL | Status: DC
  Administered 2012-02-08: 200 mg via ORAL
  Filled 2012-02-06 (×2): qty 2

## 2012-02-06 SURGICAL SUPPLY — 114 items
APL SKNCLS STERI-STRIP NONHPOA (GAUZE/BANDAGES/DRESSINGS) ×1
ATTRACTOMAT 16X20 MAGNETIC DRP (DRAPES) ×2 IMPLANT
BAG DECANTER FOR FLEXI CONT (MISCELLANEOUS) ×2 IMPLANT
BANDAGE ELASTIC 4 VELCRO ST LF (GAUZE/BANDAGES/DRESSINGS) ×2 IMPLANT
BANDAGE ELASTIC 6 VELCRO ST LF (GAUZE/BANDAGES/DRESSINGS) ×2 IMPLANT
BANDAGE GAUZE ELAST BULKY 4 IN (GAUZE/BANDAGES/DRESSINGS) ×2 IMPLANT
BENZOIN TINCTURE PRP APPL 2/3 (GAUZE/BANDAGES/DRESSINGS) ×1 IMPLANT
BLADE STERNUM SYSTEM 6 (BLADE) ×2 IMPLANT
BLADE SURG 11 STRL SS (BLADE) ×1 IMPLANT
BLADE SURG ROTATE 9660 (MISCELLANEOUS) ×1 IMPLANT
CANISTER SUCTION 2500CC (MISCELLANEOUS) ×2 IMPLANT
CANN PRFSN .5XCNCT 15X34-48 (MISCELLANEOUS) ×1
CANNULA AORTIC HI-FLOW 6.5M20F (CANNULA) ×2 IMPLANT
CANNULA PRFSN .5XCNCT 15X34-48 (MISCELLANEOUS) ×1 IMPLANT
CANNULA VEN 2 STAGE (MISCELLANEOUS) ×2
CATH CPB KIT GERHARDT (MISCELLANEOUS) ×2 IMPLANT
CATH THORACIC 28FR (CATHETERS) ×2 IMPLANT
CATH THORACIC 36FR (CATHETERS) IMPLANT
CATH THORACIC 36FR RT ANG (CATHETERS) IMPLANT
CLIP TI MEDIUM 24 (CLIP) IMPLANT
CLIP TI WIDE RED SMALL 24 (CLIP) IMPLANT
CLOTH BEACON ORANGE TIMEOUT ST (SAFETY) ×2 IMPLANT
COVER SURGICAL LIGHT HANDLE (MISCELLANEOUS) ×2 IMPLANT
CRADLE DONUT ADULT HEAD (MISCELLANEOUS) ×2 IMPLANT
DRAIN CHANNEL 28F RND 3/8 FF (WOUND CARE) ×2 IMPLANT
DRAPE CARDIOVASCULAR INCISE (DRAPES) ×2
DRAPE SLUSH/WARMER DISC (DRAPES) ×2 IMPLANT
DRAPE SRG 135X102X78XABS (DRAPES) ×1 IMPLANT
DRSG COVADERM 4X14 (GAUZE/BANDAGES/DRESSINGS) ×2 IMPLANT
ELECT BLADE 4.0 EZ CLEAN MEGAD (MISCELLANEOUS) ×2
ELECT CAUTERY BLADE 6.4 (BLADE) ×2 IMPLANT
ELECT REM PT RETURN 9FT ADLT (ELECTROSURGICAL) ×4
ELECTRODE BLDE 4.0 EZ CLN MEGD (MISCELLANEOUS) ×1 IMPLANT
ELECTRODE REM PT RTRN 9FT ADLT (ELECTROSURGICAL) ×2 IMPLANT
GLOVE BIO SURGEON STRL SZ 6 (GLOVE) ×6 IMPLANT
GLOVE BIO SURGEON STRL SZ 6.5 (GLOVE) ×9 IMPLANT
GLOVE BIO SURGEON STRL SZ7 (GLOVE) IMPLANT
GLOVE BIO SURGEON STRL SZ7.5 (GLOVE) IMPLANT
GLOVE BIOGEL PI IND STRL 6 (GLOVE) IMPLANT
GLOVE BIOGEL PI IND STRL 6.5 (GLOVE) IMPLANT
GLOVE BIOGEL PI IND STRL 7.0 (GLOVE) IMPLANT
GLOVE BIOGEL PI INDICATOR 6 (GLOVE) ×6
GLOVE BIOGEL PI INDICATOR 6.5 (GLOVE) ×4
GLOVE BIOGEL PI INDICATOR 7.0 (GLOVE) ×4
GOWN STRL NON-REIN LRG LVL3 (GOWN DISPOSABLE) ×12 IMPLANT
HEMOSTAT POWDER SURGIFOAM 1G (HEMOSTASIS) ×6 IMPLANT
HEMOSTAT SURGICEL 2X14 (HEMOSTASIS) ×2 IMPLANT
INSERT FOGARTY 61MM (MISCELLANEOUS) IMPLANT
INSERT FOGARTY XLG (MISCELLANEOUS) IMPLANT
KIT BASIN OR (CUSTOM PROCEDURE TRAY) ×2 IMPLANT
KIT ROOM TURNOVER OR (KITS) ×2 IMPLANT
KIT SUCTION CATH 14FR (SUCTIONS) ×5 IMPLANT
KIT VASOVIEW W/TROCAR VH 2000 (KITS) ×2 IMPLANT
LEAD PACING MYOCARDI (MISCELLANEOUS) ×2 IMPLANT
MARKER GRAFT CORONARY BYPASS (MISCELLANEOUS) ×6 IMPLANT
NS IRRIG 1000ML POUR BTL (IV SOLUTION) ×10 IMPLANT
PACK OPEN HEART (CUSTOM PROCEDURE TRAY) ×2 IMPLANT
PAD ARMBOARD 7.5X6 YLW CONV (MISCELLANEOUS) ×4 IMPLANT
PAD ELECT DEFIB RADIOL ZOLL (MISCELLANEOUS) ×1 IMPLANT
PENCIL BUTTON HOLSTER BLD 10FT (ELECTRODE) ×2 IMPLANT
PUNCH AORTIC ROTATE  4.5MM 8IN (MISCELLANEOUS) ×1 IMPLANT
PUNCH AORTIC ROTATE 4.0MM (MISCELLANEOUS) ×1 IMPLANT
PUNCH AORTIC ROTATE 4.5MM 8IN (MISCELLANEOUS) ×1 IMPLANT
PUNCH AORTIC ROTATE 5MM 8IN (MISCELLANEOUS) IMPLANT
SET CARDIOPLEGIA MPS 5001102 (MISCELLANEOUS) ×1 IMPLANT
SOLUTION ANTI FOG 6CC (MISCELLANEOUS) IMPLANT
SPONGE GAUZE 4X4 12PLY (GAUZE/BANDAGES/DRESSINGS) ×4 IMPLANT
SPONGE LAP 18X18 X RAY DECT (DISPOSABLE) ×3 IMPLANT
SPONGE LAP 4X18 X RAY DECT (DISPOSABLE) IMPLANT
STRIP CLOSURE SKIN 1/2X4 (GAUZE/BANDAGES/DRESSINGS) ×1 IMPLANT
SUT BONE WAX W31G (SUTURE) ×2 IMPLANT
SUT MNCRL AB 4-0 PS2 18 (SUTURE) ×1 IMPLANT
SUT PROLENE 3 0 SH DA (SUTURE) ×1 IMPLANT
SUT PROLENE 3 0 SH1 36 (SUTURE) ×2 IMPLANT
SUT PROLENE 4 0 RB 1 (SUTURE) ×2
SUT PROLENE 4 0 SH DA (SUTURE) IMPLANT
SUT PROLENE 4 0 TF (SUTURE) ×4 IMPLANT
SUT PROLENE 4-0 RB1 .5 CRCL 36 (SUTURE) IMPLANT
SUT PROLENE 5 0 C 1 36 (SUTURE) IMPLANT
SUT PROLENE 6 0 C 1 30 (SUTURE) ×2 IMPLANT
SUT PROLENE 6 0 CC (SUTURE) ×6 IMPLANT
SUT PROLENE 7 0 BV 1 (SUTURE) IMPLANT
SUT PROLENE 7 0 BV1 MDA (SUTURE) ×3 IMPLANT
SUT PROLENE 7.0 RB 3 (SUTURE) IMPLANT
SUT PROLENE 8 0 BV175 6 (SUTURE) ×6 IMPLANT
SUT SILK  1 MH (SUTURE)
SUT SILK 1 MH (SUTURE) IMPLANT
SUT SILK 2 0 SH CR/8 (SUTURE) IMPLANT
SUT SILK 3 0 SH CR/8 (SUTURE) IMPLANT
SUT STEEL 6MS V (SUTURE) ×2 IMPLANT
SUT STEEL STERNAL CCS#1 18IN (SUTURE) IMPLANT
SUT STEEL SZ 6 DBL 3X14 BALL (SUTURE) ×2 IMPLANT
SUT VIC AB 1 CTX 18 (SUTURE) ×4 IMPLANT
SUT VIC AB 1 CTX 36 (SUTURE)
SUT VIC AB 1 CTX36XBRD ANBCTR (SUTURE) IMPLANT
SUT VIC AB 2-0 CT1 27 (SUTURE) ×2
SUT VIC AB 2-0 CT1 TAPERPNT 27 (SUTURE) IMPLANT
SUT VIC AB 2-0 CTX 27 (SUTURE) IMPLANT
SUT VIC AB 3-0 SH 27 (SUTURE)
SUT VIC AB 3-0 SH 27X BRD (SUTURE) IMPLANT
SUT VIC AB 3-0 X1 27 (SUTURE) IMPLANT
SUT VICRYL 4-0 PS2 18IN ABS (SUTURE) IMPLANT
SUTURE E-PAK OPEN HEART (SUTURE) ×2 IMPLANT
SYSTEM SAHARA CHEST DRAIN ATS (WOUND CARE) ×2 IMPLANT
TAPE CLOTH SOFT 2X10 (GAUZE/BANDAGES/DRESSINGS) ×1 IMPLANT
TAPE CLOTH SURG 4X10 WHT LF (GAUZE/BANDAGES/DRESSINGS) ×1 IMPLANT
TOWEL OR 17X24 6PK STRL BLUE (TOWEL DISPOSABLE) ×4 IMPLANT
TOWEL OR 17X26 10 PK STRL BLUE (TOWEL DISPOSABLE) ×4 IMPLANT
TRAY FOLEY IC TEMP SENS 14FR (CATHETERS) ×2 IMPLANT
TUBE FEEDING 8FR 16IN STR KANG (MISCELLANEOUS) ×2 IMPLANT
TUBE SUCT INTRACARD DLP 20F (MISCELLANEOUS) ×2 IMPLANT
TUBING INSUFFLATION 10FT LAP (TUBING) ×2 IMPLANT
UNDERPAD 30X30 INCONTINENT (UNDERPADS AND DIAPERS) ×2 IMPLANT
WATER STERILE IRR 1000ML POUR (IV SOLUTION) ×4 IMPLANT

## 2012-02-06 NOTE — Anesthesia Postprocedure Evaluation (Signed)
  Anesthesia Post-op Note  Patient: Joseph [name]  Procedure(s) Performed: Procedure(s) (LRB) with comments: CORONARY ARTERY BYPASS GRAFTING (CABG) ([name])  Patient Location: PACU and SICU  Anesthesia Type:General  Level of Consciousness: unresponsive and Patient remains intubated per anesthesia plan  Airway and Oxygen Therapy: Patient remains intubated per anesthesia plan and Patient placed on Ventilator (see vital sign flow sheet for setting)  Post-op Pain: none  Post-op Assessment: Post-op Vital signs reviewed, Patient's Cardiovascular Status Stable, Respiratory Function Stable, Patent Airway and No signs of Nausea or vomiting  Post-op Vital Signs: Reviewed and stable  Complications: No apparent anesthesia complications

## 2012-02-06 NOTE — Brief Op Note (Addendum)
02/06/2012  11:09 AM  PATIENT:  Joseph [name]  56 y.o. male  PRE-OPERATIVE DIAGNOSIS:  CAD  POST-OPERATIVE DIAGNOSIS:  CAD  PROCEDURE:   CORONARY ARTERY BYPASS GRAFTING x 5 (LIMA-LAD, SVG-D, SVG-OM1, SVG-PD, dCx), EVH right leg  SURGEON:  Surgeon(s): Delight Ovens, MD  ASSISTANT: Coral Ceo, PA-C  ANESTHESIA:   general  PATIENT CONDITION:  ICU - intubated and hemodynamically stable.  PRE-OPERATIVE WEIGHT: 99 kg

## 2012-02-06 NOTE — Anesthesia Postprocedure Evaluation (Signed)
  Anesthesia Post-op Note  Patient: Joseph [name]  Procedure(s) Performed: Procedure(s) (LRB) with comments: CORONARY ARTERY BYPASS GRAFTING (CABG) ([name])  Patient Location: ICU  Anesthesia Type:General  Level of Consciousness: sedated and Patient remains intubated per anesthesia plan  Airway and Oxygen Therapy: Patient remains intubated per anesthesia plan and Patient placed on Ventilator (see vital sign flow sheet for setting)  Post-op Pain: none  Post-op Assessment: Post-op Vital signs reviewed and Patient's Cardiovascular Status Stable  Post-op Vital Signs: stable  Complications: No apparent anesthesia complications

## 2012-02-06 NOTE — Progress Notes (Signed)
Patient ID: Joseph [name], male   DOB: September 18, 1955, 56 y.o.   MRN: [id number]  SICU Evening Rounds:  Hemodynamically stable  Extubated  Urine output ok    CT output low.  BMET    Component Value Date/Time   NA 138 02/06/2012 1330   K 3.9 02/06/2012 1330   CL 103 02/05/2012 1513   CO2 26 02/05/2012 1513   GLUCOSE 125* 02/06/2012 1330   BUN 28* 02/05/2012 1513   CREATININE 1.70* 02/05/2012 1513   CALCIUM 9.2 02/05/2012 1513   GFRNONAA 43* 02/05/2012 1513   GFRAA 50* 02/05/2012 1513    CBC    Component Value Date/Time   WBC 9.4 02/06/2012 1330   RBC 3.80* 02/06/2012 1330   HGB 10.4* 02/06/2012 1330   HGB 11.2* 02/06/2012 1330   HCT 31.4* 02/06/2012 1330   HCT 33.0* 02/06/2012 1330   PLT 205 02/06/2012 1330   MCV 82.6 02/06/2012 1330   MCH 27.4 02/06/2012 1330   MCHC 33.1 02/06/2012 1330   RDW 13.7 02/06/2012 1330   LYMPHSABS 1.6 11/02/2007 2100   MONOABS 0.4 11/02/2007 2100   EOSABS 0.1 11/02/2007 2100   BASOSABS 0.0 11/02/2007 2100    A/P:  Stable postop course

## 2012-02-06 NOTE — Procedures (Signed)
Extubation Procedure Note  Patient Details:   Name: Joseph [name] DOB: 1955/05/21 MRN: [id number]   Airway Documentation:     Evaluation  O2 sats: stable throughout Complications: No apparent complications Patient did tolerate procedure well. Bilateral Breath Sounds: Clear;Diminished HR-80 RR-14 BP-101/50 02sat-100 on 4lnc    Yes  Ave Filter 02/06/2012, 6:20 PM

## 2012-02-06 NOTE — Transfer of Care (Signed)
Immediate Anesthesia Transfer of Care Note  Patient: Joseph [name]  Procedure(s) Performed: Procedure(s) (LRB) with comments: CORONARY ARTERY BYPASS GRAFTING (CABG) ([name])  Patient Location: PACU and SICU  Anesthesia Type:General  Level of Consciousness: unresponsive and Patient remains intubated per anesthesia plan  Airway & Oxygen Therapy: Patient remains intubated per anesthesia plan  Post-op Assessment: Report given to PACU RN and Post -op Vital signs reviewed and stable  Post vital signs: Reviewed and stable  Complications: No apparent anesthesia complications

## 2012-02-06 NOTE — H&P (Signed)
[address]            [place] [address]          [phone]        Joseph [name] [facility] Medical Record #[id number] Date of Birth: [birth year]  Referring: Dr Rennis Golden Primary Care: Herb Grays, MD  Chief Complaint:   Chest pain in July, positive stress test   History of Present Illness:     Patient is a 56 year old male with 25 or greater years of diabetes who presented to Dr. Rennis Golden with an episode of chest discomfort in July , at that time he noted approximately 8-10 minutes of substernal discomfort . He has not had recurrence since that time Stress test was reportedly negative, though he had positive He has been describing unstable angina symptoms and functional metabolic testing revealed MET-a low peak VO2 of 58% predicted, concerning for significant ischemia in an insulin dependent diabetic with multiple cardiac risk factors.  Current Activity/ Functional Status: Patient is independent with mobility/ambulation, transfers, ADL's, IADL's.   Past Medical History  Diagnosis Date  . Hypertension   . Depression   . Chronic kidney disease     " My kidneys are not functioning right "  . GERD (gastroesophageal reflux disease)   . Anemia   . Coronary artery disease     Dr Rennis Golden @ [facility] cardiology [place]  . Sleep apnea 2010    refuses to use cpap  . Diabetes mellitus without complication 1988    insulin dependent  . Chronic renal insufficiency, stage III (moderate)     [place] kidney    Past Surgical History  Procedure Date  . Ankle fracture surgery     History  Smoking status  . Never Smoker   Smokeless tobacco  . Never Used    History  Alcohol Use No   Family History, father deceased 72 of dm complications, mother died at 55. Four daughters and one son   History   Social History  . Marital Status: Widowed    Spouse Name: [name]    Number of Children: [name]  . Years of Education: [name]   Occupational History    . Retired, worked in Estate agent   Social History Main Topics  . Smoking status: Never Smoker   . Smokeless tobacco: Never Used  . Alcohol Use: No  . Drug Use: No  . Sexually Active:       No Known Allergies  Medications Prior to Admission  Medication Sig Dispense Refill  . amLODipine (NORVASC) 5 MG tablet Take 5 mg by mouth daily.      Marland Kitchen aspirin EC 81 MG tablet Take 81 mg by mouth daily.      . cholecalciferol (VITAMIN D) 1000 UNITS tablet Take 1,000 Units by mouth daily.      . fenofibrate 160 MG tablet Take 160 mg by mouth daily.      . ferrous sulfate 325 (65 FE) MG tablet Take 325 mg by mouth 2 (two) times daily.      Marland Kitchen gabapentin (NEURONTIN) 300 MG capsule Take 300 mg by mouth 3 (three) times daily.      . hydrALAZINE (APRESOLINE) 25 MG tablet Take 1 tablet (25 mg total) by mouth 3 (three) times daily.  90 tablet  5  . insulin aspart (NOVOLOG) 100 UNIT/ML injection Inject 10-15 Units into the skin  3 (three) times daily before meals. Per sliding scale      . insulin glargine (LANTUS) 100 UNIT/ML injection Inject 20 Units into the skin 2 (two) times daily.      . metoprolol succinate (TOPROL-XL) 100 MG 24 hr tablet Take 100 mg by mouth daily. Take with or immediately following a meal.      . omeprazole (PRILOSEC) 20 MG capsule Take 20 mg by mouth daily.      . pravastatin (PRAVACHOL) 40 MG tablet Take 40 mg by mouth daily.      . sertraline (ZOLOFT) 100 MG tablet Take 150 mg by mouth daily.      . Tamsulosin HCl (FLOMAX) 0.4 MG CAPS Take 0.4 mg by mouth daily after supper.        Current Facility-Administered Medications  Medication Dose Route Frequency Provider Last Rate Last Dose  . aminocaproic acid (AMICAR) 10 g in sodium chloride 0.9 % 100 mL infusion   Intravenous To OR Delight Ovens, MD      . cefUROXime (ZINACEF) 1.5 g in dextrose 5 % 50 mL IVPB  1.5 g Intravenous To OR Delight Ovens, MD      . cefUROXime (ZINACEF) 750 mg in dextrose 5 % 50 mL IVPB  750 mg  Intravenous To OR Delight Ovens, MD      . chlorhexidine (HIBICLENS) 4 % liquid 2 application  30 mL Topical UD Delight Ovens, MD      . dexmedetomidine (PRECEDEX) 400 mcg / 100 mL infusion  0.1-0.7 mcg/kg/hr Intravenous To OR Delight Ovens, MD      . DOPamine (INTROPIN) 800 mg in dextrose 5 % 250 mL infusion  2-20 mcg/kg/min Intravenous To OR Delight Ovens, MD      . EPINEPHrine (ADRENALIN) 4,000 mcg in dextrose 5 % 250 mL infusion  0.5-20 mcg/min Intravenous To OR Delight Ovens, MD      . heparin 2,500 Units, papaverine 30 mg in electrolyte-148 (PLASMALYTE-148) 500 mL irrigation   Irrigation To OR Delight Ovens, MD      . insulin regular (NOVOLIN R,HUMULIN R) 1 Units/mL in sodium chloride 0.9 % 100 mL infusion   Intravenous To OR Delight Ovens, MD      . magnesium sulfate (IV Push/IM) injection 40 mEq  40 mEq Other To OR Delight Ovens, MD      . metoprolol tartrate (LOPRESSOR) tablet 12.5 mg  12.5 mg Oral Once Delight Ovens, MD      . [address] mupirocin ointment (BACTROBAN) 2 %        1 application at 02/06/12 0605  . nitroGLYCERIN 0.2 mg/mL in dextrose 5 % infusion  2-200 mcg/min Intravenous To OR Delight Ovens, MD      . phenylephrine (NEO-SYNEPHRINE) 20,000 mcg in dextrose 5 % 250 mL infusion  30-200 mcg/min Intravenous To OR Delight Ovens, MD      . potassium chloride injection 80 mEq  80 mEq Other To OR Delight Ovens, MD      . vancomycin (VANCOCIN) 1,500 mg in sodium chloride 0.9 % 250 mL IVPB  1,500 mg Intravenous To OR Delight Ovens, MD       Facility-Administered Medications Ordered in Other Encounters  Medication Dose Route Frequency Provider Last Rate Last Dose  . midazolam (VERSED) 5 MG/5ML injection    PRN Shireen Quan, CRNA   2 mg at 02/06/12 0645  . SUFentanil (SUFENTA) injection  PRN Shireen Quan, CRNA   10 mcg at 02/06/12 0645     Review of Systems:     Cardiac Review of Systems: Y or N  Chest Pain [  y  ]    Resting SOB [ n  ] Exertional SOB  [ y ]  Orthopnea [ n ]   Pedal Edema [ n  ]    Palpitations [ n ] Syncope  [ n ]   Presyncope [ n  ]  General Review of Systems: [Y] = yes [  ]=no Constitional: recent weight change [n  ]; anorexia [  ]; fatigue [ y ]; nausea [  ]; night sweats [ n ]; fever [  n]; or chills [n  ];                                                                                                                                          Dental: poor dentition[ y ]; Last Dentist visit:   Eye : blurred vision [ n ]; diplopia [  n ]; vision changes [  n];  Amaurosis fugax[ n ]; Resp: cough [  ];  wheezing[ n ];  hemoptysis[  ]; shortness of breath[y  ]; paroxysmal nocturnal dyspnea[ y ]; dyspnea on exertion[y  ]; or orthopnea[n  ];  GI:  gallstones[ n ], vomiting[ n ];  dysphagia[  ]; melena[  ];  hematochezia [  n]; heartburn[  ];   Hx of  Colonoscopy[  ]; GU: kidney stones [ n ]; hematuria[n  ];   dysuria [ n ];  nocturia[  ];  history of     obstruction [  ];             Skin: rash, swelling[n  ];, hair loss[  ];  peripheral edema[  ];  or itching[  ]; Musculosketetal: myalgias[  ];  joint swelling[  ];  joint erythema[  ];  joint pain[  ];  back pain[  ];  Heme/Lymph: bruising[ n ];  bleeding[n  ];  anemia[  ];  Neuro: TIA[ n ];  headaches[  n];  stroke[ n ];  vertigo[n  ];  seizures[n  ];   paresthesias[y  ];  difficulty walking[  ];  Psych:depression[y  ]; anxiety[ y ];  Endocrine: diabetes[ y ];  thyroid dysfunction[ n ];  Immunizations: Flu [ n ]; Pneumococcal[n  ];  Other:  Physical Exam: BP 161/73  Pulse 70  Temp 98.4 F (36.9 C) (Oral)  Resp 18  Wt 221 lb 1.9 oz (100.299 kg)  SpO2 93%  General appearance: alert, cooperative and no distress Neurologic: intact Heart: regular rate and rhythm, S1, S2 normal, no murmur, click, rub or gallop and normal apical impulse Lungs: clear to auscultation bilaterally and normal percussion bilaterally Abdomen: soft, non-tender;  bowel sounds normal; no masses,  no organomegaly Extremities: Patient has  scarring of the right ankle ankle had surgery x2, his feet are viable without ulcers but there are no palpable pulses at the ankles, he appears to have adequate vein in the upper portions of each leg  patient is noted to be barrel chested, he has no carotid bruits, there is a dressing on the right groin and right wrists from the catheterization without significant hematoma.   Diagnostic Studies & Laboratory data:     Recent Radiology Findings:    01/25/2012  *RADIOLOGY REPORT*  Clinical Data: Pre-procedure evaluation.  For heart catheterization.  CHEST - 2 VIEW  Comparison: 11/02/2007.  05/18/2005.  Findings: There is stable normal appearance of the cardiac silhouette.  Mediastinal and hilar contours appear stable and within normal limits.  No pulmonary infiltrates or masses are evident. No pleural abnormality is evident.  There is minimal degenerative spondylosis.  IMPRESSION: No acute or active cardiopulmonary or pleural abnormalities are evident.   Original Report Authenticated By: Onalee Hua Call    Recent Lab Findings: Lab Results  Component Value Date   WBC 5.9 02/05/2012   HGB 11.8* 02/05/2012   HCT 35.5* 02/05/2012   PLT 294 02/05/2012   GLUCOSE 107* 02/05/2012   CHOL 103 01/31/2012   TRIG 152* 01/31/2012   HDL 28* 01/31/2012   LDLCALC 45 01/31/2012   ALT 16 02/05/2012   AST 19 02/05/2012   NA 139 02/05/2012   K 4.3 02/05/2012   CL 103 02/05/2012   CREATININE 1.70* 02/05/2012   BUN 28* 02/05/2012   CO2 26 02/05/2012   INR 1.01 02/05/2012   HGBA1C 6.7* 02/05/2012   Hemodynamics:  Central Aortic Pressure / Mean Aortic Pressure: 116/68  LV Pressure / LV End diastolic Pressure: 12  Coronary Angiographic Data:  Left Main: Normal course, bifurcates normally to the LAD and LCx arteries. No significant stenosis.  Left Anterior Descending (LAD): The proximal LAD is calcified with a tubular 80% stenosis up to the  D1 branch, where the LAD tapers to 95%. There is 80-90% ostial D1 stenosis at the bifurcation of a large D1 vessel.  1st diagonal (D1): Large vessel with ostial 85-90% stenosis, calcified at the bifurcation.  Circumflex (LCx): Dominant vessel. There are 2 small proximal OM vessels which are insignificant and moderate disease of the distal LCx.  3rd obtuse marginal: Large branch with a proximal to mid-vessel 80% discrete stenosis.  4th obtuse marginal: Another large branch with a 95% ostial stenosis and a sequential discrete 80% stenosis another 10-15 mm downstream. posterior lateral branch: No significant stenosis.  Right Coronary Artery: Non-dominant vessel, ostial reflux is present during injection. Smaller vessel.  posterior descending artery: No significant stenosis.  posterior lateral branch: Bifurcates and there is moderate to severe proximal disease of both vessels. Impression:  1. Significant proximal to mid-LAD, calcified tubular bifurcation stenosis (High risk lesion).  2. Significant disease of 2 large OM vessels.  3. Left-dominant circulation.  Plan:  1. Given his complex and calcified proximal LAD / bifurcation lesion along with his large OM branch disease and left-dominant circulation, as well as insulin-dependent diabetes, he is a better candidate for coronary bypass.  2. Family has requested Dr. Tyrone Sage. I will contact [facility] for surgical evaluation.  3. Given the complication with the right radial access site and significant dye load, in the setting of CKD, will keep overnight for hydration and anticipate d/c in the morning.       Assessment / Plan:   3 vessel coronary artery disease consistent with diabetes  Poor controlled insulin-dependent diabetes x20 years Chronic renal insufficiency stage III Sleep apnea Will check PFT's suspect copd  GERD's  I agree with Dr. Rennis Golden with the patient's 3 vessel disease even with poor quality circumflex vessels coronary artery bypass  grafting offers him the best relief of symptoms and preservation of LV function. The patient does have poorly controlled diabetes and renal insufficiency both which increases risk for cardiac surgery. The goals risks and alternatives of the planned surgical procedure CABG have been discussed with the patient in detail. The risks of the procedure including death, infection, stroke, myocardial infarction, bleeding, blood transfusion have all been discussed specifically.  I have quoted Robbie Louis a 6 % of perioperative mortality and a complication rate as high as 25 %. The patient's questions have been answered.Robbie Louis is willing  to proceed with the planned procedure.      Delight Ovens MD  Beeper [phone] Office [phone] 02/06/2012 7:08 AM

## 2012-02-06 NOTE — Anesthesia Preprocedure Evaluation (Addendum)
Anesthesia Evaluation  Patient identified by MRN, date of birth, ID band Patient awake    Reviewed: Allergy & Precautions, H&P , NPO status , Patient's Chart, lab work & pertinent test results  Airway Mallampati: II TM Distance: >3 FB Neck ROM: Full    Dental  (+) Teeth Intact   Pulmonary sleep apnea ,  breath sounds clear to auscultation        Cardiovascular hypertension, Pt. on medications and Pt. on home beta blockers + angina with exertion + CAD Rhythm:Regular Rate:Normal     Neuro/Psych Depression    GI/Hepatic GERD-  Medicated and Controlled,  Endo/Other  diabetes, Well Controlled, Type 2, Oral Hypoglycemic Agents  Renal/GU Renal InsufficiencyRenal disease     Musculoskeletal   Abdominal   Peds  Hematology   Anesthesia Other Findings   Reproductive/Obstetrics                          Anesthesia Physical Anesthesia Plan  ASA: III  Anesthesia Plan: General   Post-op Pain Management:    Induction: Intravenous  Airway Management Planned: Oral ETT  Additional Equipment: Arterial line, PA Cath, CVP and TEE  Intra-op Plan:   Post-operative Plan: Post-operative intubation/ventilation  Informed Consent: I have reviewed the patients History and Physical, chart, labs and discussed the procedure including the risks, benefits and alternatives for the proposed anesthesia with the patient or authorized representative who has indicated his/her understanding and acceptance.   Dental advisory given  Plan Discussed with: CRNA, Surgeon and Anesthesiologist  Anesthesia Plan Comments:        Anesthesia Quick Evaluation

## 2012-02-07 ENCOUNTER — Inpatient Hospital Stay (HOSPITAL_COMMUNITY): Payer: Medicare Other

## 2012-02-07 ENCOUNTER — Encounter (HOSPITAL_COMMUNITY): Payer: Self-pay | Admitting: Cardiothoracic Surgery

## 2012-02-07 LAB — POCT I-STAT, CHEM 8
BUN: 19 mg/dL (ref 6–23)
Calcium, Ion: 1.11 mmol/L — ABNORMAL LOW (ref 1.12–1.23)
Chloride: 102 mEq/L (ref 96–112)
Creatinine, Ser: 1.6 mg/dL — ABNORMAL HIGH (ref 0.50–1.35)
Glucose, Bld: 196 mg/dL — ABNORMAL HIGH (ref 70–99)
HCT: 30 % — ABNORMAL LOW (ref 39.0–52.0)
Hemoglobin: 10.2 g/dL — ABNORMAL LOW (ref 13.0–17.0)
Potassium: 4.5 mEq/L (ref 3.5–5.1)
Sodium: 140 mEq/L (ref 135–145)
TCO2: 24 mmol/L (ref 0–100)

## 2012-02-07 LAB — GLUCOSE, CAPILLARY
Glucose-Capillary: 102 mg/dL — ABNORMAL HIGH (ref 70–99)
Glucose-Capillary: 103 mg/dL — ABNORMAL HIGH (ref 70–99)
Glucose-Capillary: 104 mg/dL — ABNORMAL HIGH (ref 70–99)
Glucose-Capillary: 110 mg/dL — ABNORMAL HIGH (ref 70–99)
Glucose-Capillary: 110 mg/dL — ABNORMAL HIGH (ref 70–99)
Glucose-Capillary: 110 mg/dL — ABNORMAL HIGH (ref 70–99)
Glucose-Capillary: 111 mg/dL — ABNORMAL HIGH (ref 70–99)
Glucose-Capillary: 128 mg/dL — ABNORMAL HIGH (ref 70–99)
Glucose-Capillary: 148 mg/dL — ABNORMAL HIGH (ref 70–99)
Glucose-Capillary: 149 mg/dL — ABNORMAL HIGH (ref 70–99)
Glucose-Capillary: 151 mg/dL — ABNORMAL HIGH (ref 70–99)
Glucose-Capillary: 159 mg/dL — ABNORMAL HIGH (ref 70–99)
Glucose-Capillary: 163 mg/dL — ABNORMAL HIGH (ref 70–99)
Glucose-Capillary: 176 mg/dL — ABNORMAL HIGH (ref 70–99)
Glucose-Capillary: 184 mg/dL — ABNORMAL HIGH (ref 70–99)
Glucose-Capillary: 99 mg/dL (ref 70–99)

## 2012-02-07 LAB — CBC
HCT: 31.4 % — ABNORMAL LOW (ref 39.0–52.0)
HCT: 29.8 % — ABNORMAL LOW (ref 39.0–52.0)
HCT: 29.8 % — ABNORMAL LOW (ref 39.0–52.0)
Hemoglobin: 10.4 g/dL — ABNORMAL LOW (ref 13.0–17.0)
Hemoglobin: 10 g/dL — ABNORMAL LOW (ref 13.0–17.0)
Hemoglobin: 9.9 g/dL — ABNORMAL LOW (ref 13.0–17.0)
MCV: 83 fL (ref 78.0–100.0)
RBC: 3.8 MIL/uL — ABNORMAL LOW (ref 4.22–5.81)
RBC: 3.59 MIL/uL — ABNORMAL LOW (ref 4.22–5.81)
RBC: 3.54 MIL/uL — ABNORMAL LOW (ref 4.22–5.81)
WBC: 10.1 10*3/uL (ref 4.0–10.5)

## 2012-02-07 LAB — BASIC METABOLIC PANEL
BUN: 20 mg/dL (ref 6–23)
CO2: 24 mEq/L (ref 19–32)
Chloride: 105 mEq/L (ref 96–112)
Creatinine, Ser: 1.53 mg/dL — ABNORMAL HIGH (ref 0.50–1.35)
GFR calc Af Amer: 57 mL/min — ABNORMAL LOW (ref >90)
Potassium: 4.5 mEq/L (ref 3.5–5.1)

## 2012-02-07 LAB — MAGNESIUM: Magnesium: 2.2 mg/dL (ref 1.5–2.5)

## 2012-02-07 LAB — CREATININE, SERUM
Creatinine, Ser: 1.43 mg/dL — ABNORMAL HIGH (ref 0.50–1.35)
GFR calc Af Amer: 62 mL/min — ABNORMAL LOW (ref >90)
GFR calc non Af Amer: 53 mL/min — ABNORMAL LOW (ref >90)

## 2012-02-07 MED ORDER — INSULIN ASPART 100 UNIT/ML ~~LOC~~ SOLN
0.0000 [IU] | SUBCUTANEOUS | Status: DC
  Administered 2012-02-07: 2 [IU] via SUBCUTANEOUS
  Administered 2012-02-07 (×2): 4 [IU] via SUBCUTANEOUS
  Administered 2012-02-08 (×2): 8 [IU] via SUBCUTANEOUS
  Administered 2012-02-08 (×3): 2 [IU] via SUBCUTANEOUS

## 2012-02-07 MED ORDER — AMLODIPINE BESYLATE 5 MG PO TABS
5.0000 mg | ORAL_TABLET | Freq: Every day | ORAL | Status: DC
  Administered 2012-02-07 – 2012-02-10 (×4): 5 mg via ORAL
  Filled 2012-02-07 (×5): qty 1

## 2012-02-07 MED ORDER — PROMETHAZINE HCL 25 MG/ML IJ SOLN: 12.5000 mg | Freq: Once | INTRAMUSCULAR | Status: DC

## 2012-02-07 MED ORDER — TAMSULOSIN HCL 0.4 MG PO CAPS
0.4000 mg | ORAL_CAPSULE | Freq: Every day | ORAL | Status: DC
  Administered 2012-02-07 – 2012-02-11 (×5): 0.4 mg via ORAL
  Filled 2012-02-07 (×6): qty 1

## 2012-02-07 MED FILL — Magnesium Sulfate Inj 50%: INTRAMUSCULAR | Qty: 10 | Status: AC

## 2012-02-07 MED FILL — Potassium Chloride Inj 2 mEq/ML: INTRAVENOUS | Qty: 40 | Status: AC

## 2012-02-07 NOTE — Addendum Note (Signed)
Addendum  created 02/07/12 2141 by Kipp Brood, MD   Modules edited:Notes Section

## 2012-02-07 NOTE — Progress Notes (Signed)
Patient ID: Joseph [name], male   DOB: [birth year], 56 y.o.   MRN: [id number]                   [address]            [place] [address]          [phone]     1 Day Post-Op Procedure(s) (LRB): CORONARY ARTERY BYPASS GRAFTING (CABG) ([name])  Total Length of Stay:  LOS: 1 day  BP 140/66  Pulse 76  Temp 98.6 F (37 C) (Oral)  Resp 22  Wt 228 lb 2.8 oz (103.5 kg)  SpO2 98%  .Intake/Output      11/12 0701 - 11/13 0700 11/13 0701 - 11/14 0700   P.O.  180   I.V. (mL/kg) 3843.8 (37.1) 461.7 (4.5)   Blood 425    NG/GT 30    IV Piggyback 652 56   Total Intake(mL/kg) 4950.8 (47.8) 697.7 (6.7)   Urine (mL/kg/hr) 3130 (1.3) 870 (0.7)   Emesis/NG output 200    Blood 1625    Chest Tube 490 35   Total Output 5445 905   Net -494.2 -207.3             . sodium chloride 20 mL/hr at 02/06/12 1900  . sodium chloride 20 mL/hr at 02/06/12 1330  . sodium chloride    . dexmedetomidine Stopped (02/06/12 1620)  . DOPamine 2.5 mcg/kg/min (02/07/12 1800)  . insulin (NOVOLIN-R) infusion 1.5 Units/hr (02/06/12 1900)  . lactated ringers 50 mL/hr at 02/06/12 1845  . nitroGLYCERIN Stopped (02/07/12 0630)  . phenylephrine (NEO-SYNEPHRINE) Adult infusion Stopped (02/06/12 1730)     Lab Results  Component Value Date   WBC 9.5 02/07/2012   HGB 10.2* 02/07/2012   HCT 30.0* 02/07/2012   PLT 213 02/07/2012   GLUCOSE 196* 02/07/2012   CHOL 103 01/31/2012   TRIG 152* 01/31/2012   HDL 28* 01/31/2012   LDLCALC 45 01/31/2012   ALT 16 02/05/2012   AST 19 02/05/2012   NA 140 02/07/2012   K 4.5 02/07/2012   CL 102 02/07/2012   CREATININE 1.60* 02/07/2012   BUN 19 02/07/2012   CO2 24 02/07/2012   INR 1.43 02/06/2012   HGBA1C 6.7* 02/05/2012   Still with nausea, abdomen soft with out tenderness  Delight Ovens MD  Beeper [phone] Office [phone] 02/07/2012 6:56 PM

## 2012-02-07 NOTE — Progress Notes (Addendum)
[facility] DAILY PROGRESS NOTE                   [address]            [place] [address]          [phone]      1 Day Post-Op Procedure(s) (LRB): CORONARY ARTERY BYPASS GRAFTING (CABG) ([name])  Total Length of Stay:  LOS: 1 day   Subjective: Significant nausea  Objective: Vital signs in last 24 hours: Temp:  [97.7 F (36.5 C)-99.5 F (37.5 C)] 99.1 F (37.3 C) (11/13 0700) Pulse Rate:  [73-104] 80  (11/13 0700) Cardiac Rhythm:  [-] Atrial paced (11/13 0400) Resp:  [12-29] 18  (11/13 0700) BP: (85-142)/(51-87) 119/55 mmHg (11/13 0700) SpO2:  [95 %-100 %] 99 % (11/13 0700) Arterial Line BP: (88-156)/(44-86) 126/65 mmHg (11/13 0630) FiO2 (%):  [40 %-50 %] 40 % (11/12 1737) Weight:  [228 lb 2.8 oz (103.5 kg)] 228 lb 2.8 oz (103.5 kg) (11/13 0500)  Filed Weights   02/05/12 1415 02/07/12 0500  Weight: 221 lb 1.9 oz (100.299 kg) 228 lb 2.8 oz (103.5 kg)    Weight change: 7 lb 0.9 oz (3.201 kg)   Hemodynamic parameters for last 24 hours: PAP: (25-55)/(7-26) 38/19 mmHg CO:  [3.5 L/min-7.1 L/min] 5.1 L/min CI:  [1.7 L/min/m2-3.4 L/min/m2] 2.4 L/min/m2  Intake/Output from previous day: 11/12 0701 - 11/13 0700 In: 4950.8 [I.V.:3843.8; Blood:425; NG/GT:30; IV Piggyback:652] Out: 5445 [Urine:3130; Emesis/NG output:200; Blood:1625; Chest Tube:490]  Intake/Output this shift:    Current Meds: Scheduled Meds:   . [COMPLETED] acetaminophen  1,000 mg Intravenous Once  . acetaminophen  1,000 mg Oral Q6H   Or  . acetaminophen (TYLENOL) oral liquid 160 mg/5 mL  975 mg Per Tube Q6H  . [COMPLETED] aminocaproic acid (AMICAR) for OHS   Intravenous To OR  . aspirin EC  325 mg Oral Daily   Or  . aspirin  324 mg Per Tube Daily  . bisacodyl  10 mg Oral Daily   Or  . bisacodyl  10 mg Rectal Daily  . [COMPLETED] cefUROXime (ZINACEF)  IV  1.5 g Intravenous To OR  . cefUROXime (ZINACEF)  IV  1.5 g Intravenous Q12H  . Chlorhexidine Gluconate Cloth  6 each Topical Daily    . cholecalciferol  1,000 Units Oral Daily  . [COMPLETED] dexmedetomidine  0.1-0.7 mcg/kg/hr Intravenous To OR  . docusate sodium  200 mg Oral Daily  . [COMPLETED] DOPamine  2-20 mcg/kg/min Intravenous To OR  . famotidine (PEPCID) IV  20 mg Intravenous Q12H  . fenofibrate  160 mg Oral Daily  . gabapentin  300 mg Oral TID  . [COMPLETED] heparin-papaverine-plasmalyte irrigation   Irrigation To OR  . insulin aspart  0-24 Units Subcutaneous Q2H   Followed by  . insulin aspart  0-24 Units Subcutaneous Q4H  . insulin glargine  20 Units Subcutaneous Daily  . [COMPLETED] insulin (NOVOLIN-R) infusion   Intravenous To OR  . insulin regular  0-10 Units Intravenous TID WC  . metoCLOPramide (REGLAN) injection  10 mg Intravenous Q6H  . metoprolol tartrate  12.5 mg Oral BID   Or  . metoprolol tartrate  12.5 mg Per Tube BID  . mupirocin ointment  1 application Nasal BID  . pantoprazole  40 mg Oral Daily  . [COMPLETED] phenylephrine (NEO-SYNEPHRINE) Adult infusion  30-200 mcg/min Intravenous To OR  . [EXPIRED] potassium chloride  10 mEq Intravenous Q1 Hr x 3  . sertraline  150 mg Oral Daily  . simvastatin  20 mg Oral q1800  . sodium chloride  3 mL Intravenous Q12H  . Tamsulosin HCl  0.4 mg Oral QPC supper  . [COMPLETED] vancomycin  1,000 mg Intravenous Once  . [DISCONTINUED] cefUROXime (ZINACEF)  IV  750 mg Intravenous To OR  . [DISCONTINUED] chlorhexidine  30 mL Topical UD  . [DISCONTINUED] epinephrine  0.5-20 mcg/min Intravenous To OR  . [DISCONTINUED] insulin aspart  0-24 Units Subcutaneous Q4H  . [DISCONTINUED] magnesium sulfate  40 mEq Other To OR  . [DISCONTINUED] magnesium sulfate  4 g Intravenous Once  . [DISCONTINUED] metoprolol tartrate  12.5 mg Oral Once  . [DISCONTINUED] potassium chloride  80 mEq Other To OR   Continuous Infusions:   . sodium chloride 20 mL/hr at 02/06/12 1900  . sodium chloride 20 mL/hr at 02/06/12 1330  . sodium chloride    . dexmedetomidine Stopped (02/06/12  1620)  . DOPamine 2.5 mcg/kg/min (02/07/12 0700)  . insulin (NOVOLIN-R) infusion 1.5 Units/hr (02/06/12 1900)  . lactated ringers 50 mL/hr at 02/06/12 1845  . nitroGLYCERIN Stopped (02/07/12 0630)  . phenylephrine (NEO-SYNEPHRINE) Adult infusion Stopped (02/06/12 1730)  . [DISCONTINUED] insulin (NOVOLIN-R) infusion 2.6 Units/hr (02/06/12 1330)   PRN Meds:.albumin human, fentaNYL, insulin (NOVOLIN-R) infusion, [EXPIRED] lactated ringers, metoprolol, midazolam, ondansetron (ZOFRAN) IV, oxyCODONE, sodium chloride, [DISCONTINUED] hemostatic agents, [DISCONTINUED]  morphine injection, [DISCONTINUED]  morphine injection, [DISCONTINUED] Surgifoam 1 Gm with 0.9% sodium chloride (4 ml) topical solution  General appearance: alert, cooperative and mild distress Heart: regular rate and rhythm Lungs: clear anteriorly Abdomen: nontenser, mild distension Extremities: min edema Wound: dressings CDI  Lab Results: CBC: Basename 02/07/12 0410 02/06/12 1902 02/06/12 1330  WBC 10.1 -- 9.4  HGB 10.0* 10.9* --  HCT 29.8* 32.0* --  PLT 211 -- 205   BMET:  Basename 02/07/12 0410 02/06/12 1902 02/05/12 1513  NA 139 142 --  K 4.5 4.4 --  CL 105 107 --  CO2 24 -- 26  GLUCOSE 185* 106* --  BUN 20 24* --  CREATININE 1.53* 1.60* --  CALCIUM 7.8* -- 9.2    PT/INR:  Basename 02/06/12 1330  LABPROT 17.1*  INR 1.43   ABG    Component Value Date/Time   PHART 7.363 02/06/2012 1855   PCO2ART 40.9 02/06/2012 1855   PO2ART 85.0 02/06/2012 1855   HCO3 23.3 02/06/2012 1855   TCO2 22 02/06/2012 1902   ACIDBASEDEF 2.0 02/06/2012 1855   O2SAT 96.0 02/06/2012 1855    Radiology: Dg Chest Portable 1 View In Am  02/07/2012  *RADIOLOGY REPORT*  Clinical Data: Postop CABG.  PORTABLE CHEST - 1 VIEW  Comparison: 02/06/2012  Findings: Interval extubation and removal of NG tube.  Left chest tube and Swan-Ganz catheter remain in place.  No pneumothorax. Bibasilar atelectasis, left greater than right.  IMPRESSION:  Interval extubation.  Bibasilar atelectasis.  No pneumothorax.   Original Report Authenticated By: Charlett Nose, M.D.    Dg Chest Portable 1 View  02/06/2012  *RADIOLOGY REPORT*  Clinical Data: Status post CABG.  PORTABLE CHEST - 1 VIEW  Comparison: 01/25/2012  Findings: Postoperatively, endotracheal tube is present with the tip approximately 2.5 cm above the carina.  A left chest tube is in place with no pneumothorax visualized.  Swan-Ganz catheter extends into the main pulmonary artery.  A nasogastric tube extends into the stomach.  Lungs show low volumes with atelectasis of the left lower lobe.  No overt pulmonary edema.  Gross heart size and mediastinal  contours are stable.  IMPRESSION: Low lung volumes and left lower lobe atelectasis postoperatively. No pneumothorax visualized.   Original Report Authenticated By: Irish Lack, M.D.      Assessment/Plan: S/P Procedure(s) (LRB): CORONARY ARTERY BYPASS GRAFTING (CABG) ([name])  1. Doing well, nausea should resolve as meds decreased. Morphine was changed to fentanyl. Just received more zofran. Keep npo till resolved 2 D/C tubes, lines 3 hypertension related in part to dry heaving , will restart flomax and norvasc. Was also on hydralazine at home and will prob need to restart at some point 4 Watch creatinine- keep renal dop for now, leave foley for now as just restarting flomax 5 expected acute blood loss anemia- monitor 6 CBG ok, cont SSI, will hold lantus for now since not eating    GOLD,WAYNE E 02/07/2012 7:48 AM    Renal function at baseline, follow closely I have seen and examined Robbie Louis and agree with the above assessment  and plan.  Delight Ovens MD Beeper [phone] Office [phone] 02/07/2012 8:40 AM

## 2012-02-07 NOTE — Progress Notes (Signed)
  Echocardiogram Echocardiogram Transesophageal has been performed.  Joseph [name] 02/07/2012, 3:14 PM

## 2012-02-07 NOTE — Progress Notes (Signed)
Anesthesiology Follow-up:  Awake, alert neurologically intact. Complaining of nausea at present.  VS: T-37.2 BP 112/69 HR 80 (a-paced, SR in 60s underneath) RR 24  O2 Sat 96 % on 4L [place] PA 24/15 CO/CI 4.05/2.4  H/H 10.0/29.8 Plts 211,000 Cr 1.53 K 4.5  Extubated 5 hours post-op  Hemodynamically stable, renal function at baseline, post-op course uneventful so far.  Kipp Brood, MD

## 2012-02-07 NOTE — Addendum Note (Signed)
Addendum  created 02/07/12 0806 by Kipp Brood, MD   Modules edited:Notes Section

## 2012-02-07 NOTE — Op Note (Signed)
NAMELARONE, KLIETHERMES NO.:  [id number]  MEDICAL RECORD NO.:  [id number]  LOCATION:  2302                         FACILITY:  [facility]  PHYSICIAN:  Sheliah Plane, MD    DATE OF BIRTH:  [birth year]  DATE OF PROCEDURE:  02/06/2012 DATE OF DISCHARGE:                              OPERATIVE REPORT   PREOPERATIVE DIAGNOSIS:  Coronary occlusive disease with unstable angina.  POSTOPERATIVE DIAGNOSIS:  Coronary occlusive disease with unstable angina.  SURGICAL PROCEDURE:  Coronary artery bypass grafting x5 with the left internal mammary to the left anterior descending coronary artery, reverse saphenous vein graft to the diagonal coronary artery, reverse saphenous vein graft to the distal first obtuse marginal, sequential reverse saphenous vein graft to the posterior descending coronary artery and the second obtuse marginal coronary artery with right leg endo vein harvesting.  SURGEON:  Sheliah Plane, MD  FIRST ASSISTANT:  Coral Ceo, PA  BRIEF HISTORY:  The patient is a 56 year old diabetic male, who presented with unstable anginal symptoms, underwent cardiac catheterization, which demonstrates severe 3-vessel coronary artery disease with high-grade LAD obstruction.  The patient has significant diabetes with relatively poor distal vessels but because of his symptoms and the degree of proximal blockage, coronary artery bypass grafting was recommended.  The patient has known renal insufficiency with baseline creatinine of 1.7.  Risks and options were discussed with the patient in detail.  He was agreeable with proceeding and signed informed consent.  DESCRIPTION OF PROCEDURE:  With [facility]-G[facility] arterial line monitors in place, the patient underwent general endotracheal anesthesia without incidence.  Skin of the chest was prepped with Betadine and draped in usual sterile manner.  Using the [employer] endo vein harvest system, vein was harvested from the right thigh and  calf.  It was of good quality and caliber.  Median sternotomy was performed and the left internal mammary artery was dissected down as a pedicle graft.  The distal artery was divided and had good free flow.  Pericardium was opened.  Overall ventricular function appeared preserved.  This was confirmed with TEE which was done intraoperatively and dictated under separate note.  The patient was systemically heparinized.  The ascending aorta was cannulated, the right atrium was cannulated, and the aortic root vent cardioplegia needle was introduced into the ascending aorta.  The patient was placed on cardiopulmonary bypass at 2.4 L/minute per meter squared.  Sites of anastomosis were selected and dissected out of epicardium.  The patient's body temperature was cooled to 32 degrees. Aortic crossclamp was applied and 700 mL of cold blood cardioplegia was administered with diastolic arrest of the heart.  Myocardial septal temperature was monitored throughout the crossclamp.  Attention was turned to the first obtuse marginal, which had mid to distal obstruction and the distal portion of the vessel was opened and admitted a 1.5 mm probe.  Using a running 7-0 Prolene segment, reverse saphenous vein graft was anastomosed to the distal first obtuse marginal.  The heart was then elevated and the second obtuse marginal in the posterior descending arose from the distal circumflex.  These vessels were sequentialed with a second reverse saphenous vein graft, carried around the right side of the heart.  The posterior descending was opened and was a relatively small vessel, admitted a 1 mm probe.  Using a running 8- 0 Prolene, a side-to-side anastomosis was carried out.  The distal extent of the same vein was carried distal to the second obtuse marginal which was 1.5 mm in size.  Using a running 7-0 Prolene, this anastomosis was performed.  Additional cold blood cardioplegia administered down the vein  grafts.  The right coronary artery was not bypassed.  This was a small nondominant vessel without significant obstruction.  Attention was then turned to the first diagonal, which was small vessel but admitted a 1 mm probe.  Using a running 8-0 Prolene, distal anastomosis was performed.  The proximal 2/3rd of the LAD were intramyocardial.  The distal third was opened and admitted a 1.5 mm probe proximally and distally.  Using a running 8-0 Prolene, left internal mammary artery was anastomosed to the left anterior descending coronary artery.  With release of the bulldog on the mammary artery, there was appropriate rise in myocardial septal temperature.  Bulldog was placed back on the mammary artery and proceeded with proximal anastomoses with the crossclamp still in place.  Three punch aortotomies were performed. Each of the 3 vein grafts were anastomosed to the ascending aorta.  Air was evacuated from the grafts and partial occlusion clamp was removed with total cross clamp time of 103 minutes.  The patient transiently required AV pacing but ultimately was atrially paced only.  Body temperature rewarmed to 37 degrees.  He was then ventilated and weaned from cardiopulmonary bypass without difficulty.  He remained hemodynamically stable and was decannulated in usual fashion.  The sulfate was administered with operative field hemostatic.  Atrial and ventricular pacing wires had been applied.  A left pleural tube was left in place.  Pericardium was reapproximated.  Sternum was closed with #6 stainless steel wire.  Fascia closed with interrupted 0 Vicryl, running 3-0 Vicryl subcutaneous tissue, 4-0 subcuticular stitch in skin edges. Dry dressings were applied.  Sponge and needle count was reported as correct at completion of the procedure.  The patient tolerated the procedure well without obvious complication, transferred to surgical intensive care unit for further postoperative care.  Total  pump time was 134 minutes.  The patient was continued while on pump and after separation from pump on low-dose dopamine because of his renal insufficiency.     Sheliah Plane, MD     EG/MEDQ  D:  02/07/2012  T:  02/07/2012  Job:  [id number]  cc:   Landry Corporal, MD

## 2012-02-07 NOTE — Progress Notes (Addendum)
[facility]  DAILY PROGRESS NOTE   Subjective:  POD #1. Doing well, some chest soreness. Complains of nausea and dry heaves.  Objective:  Temp:  [97.7 F (36.5 C)-99.5 F (37.5 C)] 99.1 F (37.3 C) (11/13 0845) Pulse Rate:  [69-104] 69  (11/13 1200) Resp:  [12-29] 22  (11/13 1200) BP: (85-150)/(51-87) 133/63 mmHg (11/13 1100) SpO2:  [90 %-100 %] 93 % (11/13 1200) Arterial Line BP: (86-159)/(44-86) 148/57 mmHg (11/13 0845) FiO2 (%):  [40 %] 40 % (11/12 1737) Weight:  [103.5 kg (228 lb 2.8 oz)] 103.5 kg (228 lb 2.8 oz) (11/13 0500) Weight change: 3.201 kg (7 lb 0.9 oz)  Intake/Output from previous day: 11/12 0701 - 11/13 0700 In: 4950.8 [I.V.:3843.8; Blood:425; NG/GT:30; IV Piggyback:652] Out: 5445 [Urine:3130; Emesis/NG output:200; Blood:1625; Chest Tube:490]  Intake/Output from this shift: Total I/O In: 364.2 [P.O.:120; I.V.:188.2; IV Piggyback:56] Out: 580 [Urine:545; Chest Tube:35]  Medications: Current Facility-Administered Medications  Medication Dose Route Frequency Provider Last Rate Last Dose  . 0.45 % sodium chloride infusion   Intravenous Continuous Wilmon Pali, PA 20 mL/hr at 02/06/12 1900    . 0.9 %  sodium chloride infusion   Intravenous Continuous Wilmon Pali, PA 20 mL/hr at 02/06/12 1330    . 0.9 %  sodium chloride infusion  250 mL Intravenous Continuous Wilmon Pali, PA      . [COMPLETED] acetaminophen (OFIRMEV) IV 1,000 mg  1,000 mg Intravenous Once Wilmon Pali, PA   1,000 mg at 02/06/12 1641  . acetaminophen (TYLENOL) tablet 1,000 mg  1,000 mg Oral Q6H Wilmon Pali, PA       Or  . acetaminophen (TYLENOL) solution 975 mg  975 mg Per Tube Q6H Gina L Collins, PA      . albumin human 5 % solution 250 mL  250 mL Intravenous Q15 min PRN Wilmon Pali, PA   250 mL at 02/06/12 1549  . amLODipine (NORVASC) tablet 5 mg  5 mg Oral Daily Rowe Clack, PA   5 mg at 02/07/12 0945  . aspirin EC tablet 325 mg  325 mg Oral Daily  Wilmon Pali, PA       Or  . aspirin chewable tablet 324 mg  324 mg Per Tube Daily Wilmon Pali, PA      . bisacodyl (DULCOLAX) EC tablet 10 mg  10 mg Oral Daily Wilmon Pali, PA       Or  . bisacodyl (DULCOLAX) suppository 10 mg  10 mg Rectal Daily Wilmon Pali, PA      . cefUROXime (ZINACEF) 1.5 g in dextrose 5 % 50 mL IVPB  1.5 g Intravenous Q12H Wilmon Pali, PA   1.5 g at 02/07/12 0743  . Chlorhexidine Gluconate Cloth 2 % PADS 6 each  6 each Topical Daily Delight Ovens, MD   6 each at 02/07/12 1158  . cholecalciferol (VITAMIN D) tablet 1,000 Units  1,000 Units Oral Daily Wilmon Pali, PA      . dexmedetomidine (PRECEDEX) 200 mcg / 50 mL infusion  0.1-0.7 mcg/kg/hr Intravenous Continuous Wilmon Pali, PA   0.1 mcg/kg/hr at 02/06/12 1600  . docusate sodium (COLACE) capsule 200 mg  200 mg Oral Daily Wilmon Pali, PA      . DOPamine (INTROPIN) 800 mg in dextrose 5 % 250 mL infusion  2.5 mcg/kg/min Intravenous Titrated Wilmon Pali, PA 4.7 mL/hr at 02/07/12 1300 2.5 mcg/kg/min at  02/07/12 1300  . [EXPIRED] famotidine (PEPCID) IVPB 20 mg  20 mg Intravenous Q12H Wilmon Pali, PA   20 mg at 02/06/12 1330  . fenofibrate tablet 160 mg  160 mg Oral Daily Wilmon Pali, PA      . fentaNYL (SUBLIMAZE) injection 50-100 mcg  50-100 mcg Intravenous Q1H PRN Alleen Borne, MD   50 mcg at 02/07/12 0857  . gabapentin (NEURONTIN) capsule 300 mg  300 mg Oral TID Wilmon Pali, PA   300 mg at 02/07/12 0955  . insulin aspart (novoLOG) injection 0-24 Units  0-24 Units Subcutaneous Q2H Delight Ovens, MD   4 Units at 02/07/12 0146  . insulin aspart (novoLOG) injection 0-24 Units  0-24 Units Subcutaneous Q4H Rowe Clack, PA   2 Units at 02/07/12 1211  . insulin regular (NOVOLIN R,HUMULIN R) 1 Units/mL in sodium chloride 0.9 % 100 mL infusion   Intravenous Continuous PRN Delight Ovens, MD 1.5 mL/hr at 02/06/12 1900 1.5 Units/hr at 02/06/12 1900  . [EXPIRED] lactated ringers infusion  500 mL  500 mL Intravenous Once PRN Wilmon Pali, PA      . lactated ringers infusion   Intravenous Continuous Wilmon Pali, PA 50 mL/hr at 02/06/12 1845    . metoCLOPramide (REGLAN) injection 10 mg  10 mg Intravenous Q6H Alleen Borne, MD   10 mg at 02/07/12 1211  . metoprolol (LOPRESSOR) injection 2.5-5 mg  2.5-5 mg Intravenous Q2H PRN Wilmon Pali, PA   5 mg at 02/07/12 0135  . metoprolol tartrate (LOPRESSOR) tablet 12.5 mg  12.5 mg Oral BID Wilmon Pali, PA   12.5 mg at 02/07/12 0946   Or  . metoprolol tartrate (LOPRESSOR) 25 mg/10 mL oral suspension 12.5 mg  12.5 mg Per Tube BID Wilmon Pali, PA      . midazolam (VERSED) injection 2 mg  2 mg Intravenous Q1H PRN Wilmon Pali, PA      . mupirocin ointment (BACTROBAN) 2 % 1 application  1 application Nasal BID Delight Ovens, MD   1 application at 02/07/12 1002  . nitroGLYCERIN 0.2 mg/mL in dextrose 5 % infusion  0-100 mcg/min Intravenous Continuous Wilmon Pali, PA   10 mcg/min at 02/07/12 0600  . ondansetron (ZOFRAN) injection 4 mg  4 mg Intravenous Q6H PRN Wilmon Pali, PA   4 mg at 02/07/12 0754  . oxyCODONE (Oxy IR/ROXICODONE) immediate release tablet 5-10 mg  5-10 mg Oral Q3H PRN Wilmon Pali, PA   5 mg at 02/07/12 0948  . pantoprazole (PROTONIX) EC tablet 40 mg  40 mg Oral Daily Wilmon Pali, PA      . phenylephrine (NEO-SYNEPHRINE) 20,000 mcg in dextrose 5 % 250 mL infusion  0-100 mcg/min Intravenous Continuous Wilmon Pali, PA   5 mcg/min at 02/06/12 1700  . [EXPIRED] potassium chloride 10 mEq in 50 mL *CENTRAL LINE* IVPB  10 mEq Intravenous Q1 Hr x 3 Gina L Collins, PA      . sertraline (ZOLOFT) tablet 150 mg  150 mg Oral Daily Wilmon Pali, PA   150 mg at 02/07/12 0950  . simvastatin (ZOCOR) tablet 20 mg  20 mg Oral q1800 Wilmon Pali, PA      . sodium chloride 0.9 % injection 3 mL  3 mL Intravenous Q12H Wilmon Pali, PA   3 mL at 02/07/12 0959  . sodium chloride 0.9 % injection 3 mL  3  mL Intravenous  PRN Wilmon Pali, PA      . Tamsulosin HCl (FLOMAX) capsule 0.4 mg  0.4 mg Oral QHS Wayne E Gold, PA      . [COMPLETED] vancomycin (VANCOCIN) IVPB 1000 mg/200 mL premix  1,000 mg Intravenous Once Wilmon Pali, PA   1,000 mg at 02/06/12 2208  . [DISCONTINUED] insulin aspart (novoLOG) injection 0-24 Units  0-24 Units Subcutaneous Q4H Delight Ovens, MD      . [DISCONTINUED] insulin aspart (novoLOG) injection 0-24 Units  0-24 Units Subcutaneous Q4H Delight Ovens, MD   2 Units at 02/07/12 [phone]  . [DISCONTINUED] insulin glargine (LANTUS) injection 20 Units  20 Units Subcutaneous Daily Delight Ovens, MD      . [DISCONTINUED] insulin regular (NOVOLIN R,HUMULIN R) 1 Units/mL in sodium chloride 0.9 % 100 mL infusion   Intravenous Continuous Wilmon Pali, PA 2.6 mL/hr at 02/06/12 1330 2.6 Units/hr at 02/06/12 1330  . [DISCONTINUED] insulin regular bolus via infusion 0-10 Units  0-10 Units Intravenous TID WC Wilmon Pali, PA      . [DISCONTINUED] magnesium sulfate IVPB 4 g 100 mL  4 g Intravenous Once Wilmon Pali, PA      . [DISCONTINUED] morphine 2 MG/ML injection 1-4 mg  1-4 mg Intravenous Q1H PRN Wilmon Pali, PA   2 mg at 02/06/12 2045  . [DISCONTINUED] morphine 2 MG/ML injection 2-5 mg  2-5 mg Intravenous Q1H PRN Wilmon Pali, PA      . [DISCONTINUED] Tamsulosin HCl (FLOMAX) capsule 0.4 mg  0.4 mg Oral QPC supper Wilmon Pali, PA        Physical Exam: General appearance: alert and mild distress Neck: no adenopathy, no carotid bruit, no JVD, supple, symmetrical, trachea midline and thyroid not enlarged, symmetric, no tenderness/mass/nodules Lungs: clear to auscultation bilaterally Heart: regular rate and rhythm, S1, S2 normal, no murmur, click, rub or gallop Abdomen: soft, non-tender; bowel sounds normal; no masses,  no organomegaly Extremities: extremities normal, atraumatic, no cyanosis or edema Pulses: 2+ and symmetric Skin: Skin color, texture, turgor normal. No rashes  or lesions Neurologic: Grossly normal  Lab Results: Results for orders placed during the hospital encounter of 02/06/12 (from the past 48 hour(s))  GLUCOSE, CAPILLARY     Status: Abnormal   Collection Time   02/06/12  5:58 AM      Component Value Range Comment   Glucose-Capillary 167 (*) 70 - 99 mg/dL   POCT I-STAT 4, (NA,K, GLUC, HGB,HCT)     Status: Abnormal   Collection Time   02/06/12  7:58 AM      Component Value Range Comment   Sodium 139  135 - 145 mEq/L    Potassium 4.4  3.5 - 5.1 mEq/L    Glucose, Bld 167 (*) 70 - 99 mg/dL    HCT 96.0 (*) 45.4 - 52.0 %    Hemoglobin 10.9 (*) 13.0 - 17.0 g/dL   POCT I-STAT 4, (NA,K, GLUC, HGB,HCT)     Status: Abnormal   Collection Time   02/06/12  9:21 AM      Component Value Range Comment   Sodium 140  135 - 145 mEq/L    Potassium 4.3  3.5 - 5.1 mEq/L    Glucose, Bld 166 (*) 70 - 99 mg/dL    HCT 09.8 (*) 11.9 - 52.0 %    Hemoglobin 10.5 (*) 13.0 - 17.0 g/dL   POCT I-STAT 4, (NA,K, GLUC, HGB,HCT)  Status: Abnormal   Collection Time   02/06/12  9:48 AM      Component Value Range Comment   Sodium 136  135 - 145 mEq/L    Potassium 4.2  3.5 - 5.1 mEq/L    Glucose, Bld 138 (*) 70 - 99 mg/dL    HCT 69.6 (*) 29.5 - 52.0 %    Hemoglobin 7.8 (*) 13.0 - 17.0 g/dL   POCT I-STAT 3, BLOOD GAS (G3+)     Status: Abnormal   Collection Time   02/06/12  9:51 AM      Component Value Range Comment   pH, Arterial 7.346 (*) 7.350 - 7.450    pCO2 arterial 42.7  35.0 - 45.0 mmHg    pO2, Arterial 255.0 (*) 80.0 - 100.0 mmHg    Bicarbonate 23.4  20.0 - 24.0 mEq/L    TCO2 25  0 - 100 mmol/L    O2 Saturation 100.0      Acid-base deficit 2.0  0.0 - 2.0 mmol/L    Sample type ARTERIAL     POCT I-STAT GLUCOSE     Status: Abnormal   Collection Time   02/06/12 10:57 AM      Component Value Range Comment   Operator id [id number]      Glucose, Bld 125 (*) 70 - 99 mg/dL   HEMOGLOBIN AND HEMATOCRIT, BLOOD     Status: Abnormal   Collection Time   02/06/12  11:06 AM      Component Value Range Comment   Hemoglobin 7.1 (*) 13.0 - 17.0 g/dL DELTA CHECK NOTED   HCT 20.7 (*) 39.0 - 52.0 %   PLATELET COUNT     Status: Normal   Collection Time   02/06/12 11:06 AM      Component Value Range Comment   Platelets 156  150 - 400 K/uL DELTA CHECK NOTED  POCT I-STAT 4, (NA,K, GLUC, HGB,HCT)     Status: Abnormal   Collection Time   02/06/12 11:10 AM      Component Value Range Comment   Sodium 136  135 - 145 mEq/L    Potassium 5.6 (*) 3.5 - 5.1 mEq/L    Glucose, Bld 117 (*) 70 - 99 mg/dL    HCT 28.4 (*) 13.2 - 52.0 %    Hemoglobin 7.1 (*) 13.0 - 17.0 g/dL   POCT I-STAT 4, (NA,K, GLUC, HGB,HCT)     Status: Abnormal   Collection Time   02/06/12 12:09 PM      Component Value Range Comment   Sodium 137  135 - 145 mEq/L    Potassium 4.5  3.5 - 5.1 mEq/L    Glucose, Bld 145 (*) 70 - 99 mg/dL    HCT 44.0 (*) 10.2 - 52.0 %    Hemoglobin 8.5 (*) 13.0 - 17.0 g/dL   POCT I-STAT 3, BLOOD GAS (G3+)     Status: Abnormal   Collection Time   02/06/12 12:13 PM      Component Value Range Comment   pH, Arterial 7.324 (*) 7.350 - 7.450    pCO2 arterial 46.0 (*) 35.0 - 45.0 mmHg    pO2, Arterial 165.0 (*) 80.0 - 100.0 mmHg    Bicarbonate 23.9  20.0 - 24.0 mEq/L    TCO2 25  0 - 100 mmol/L    O2 Saturation 99.0      Acid-base deficit 2.0  0.0 - 2.0 mmol/L    Sample type ARTERIAL     POCT I-STAT 3, BLOOD  GAS (G3+)     Status: Abnormal   Collection Time   02/06/12  1:25 PM      Component Value Range Comment   pH, Arterial 7.367  7.350 - 7.450    pCO2 arterial 42.6  35.0 - 45.0 mmHg    pO2, Arterial 62.0 (*) 80.0 - 100.0 mmHg    Bicarbonate 24.6 (*) 20.0 - 24.0 mEq/L    TCO2 26  0 - 100 mmol/L    O2 Saturation 91.0      Acid-base deficit 1.0  0.0 - 2.0 mmol/L    Patient temperature 36.5 C      Collection site RADIAL, ALLEN'S TEST ACCEPTABLE      Drawn by Nurse      Sample type ARTERIAL     CBC     Status: Abnormal   Collection Time   02/06/12  1:30 PM       Component Value Range Comment   WBC 9.4  4.0 - 10.5 K/uL    RBC 3.80 (*) 4.22 - 5.81 MIL/uL    Hemoglobin 10.4 (*) 13.0 - 17.0 g/dL REPEATED TO VERIFY   HCT 31.4 (*) 39.0 - 52.0 %    MCV 82.6  78.0 - 100.0 fL    MCH 27.4  26.0 - 34.0 pg    MCHC 33.1  30.0 - 36.0 g/dL    RDW 16.1  09.6 - 04.5 %    Platelets 205  150 - 400 K/uL REPEATED TO VERIFY  PROTIME-INR     Status: Abnormal   Collection Time   02/06/12  1:30 PM      Component Value Range Comment   Prothrombin Time 17.1 (*) 11.6 - 15.2 seconds    INR 1.43  0.00 - 1.49   APTT     Status: Normal   Collection Time   02/06/12  1:30 PM      Component Value Range Comment   aPTT 34  24 - 37 seconds   POCT I-STAT 4, (NA,K, GLUC, HGB,HCT)     Status: Abnormal   Collection Time   02/06/12  1:30 PM      Component Value Range Comment   Sodium 138  135 - 145 mEq/L    Potassium 3.9  3.5 - 5.1 mEq/L    Glucose, Bld 125 (*) 70 - 99 mg/dL    HCT 40.9 (*) 81.1 - 52.0 %    Hemoglobin 11.2 (*) 13.0 - 17.0 g/dL   GLUCOSE, CAPILLARY     Status: Abnormal   Collection Time   02/06/12  2:05 PM      Component Value Range Comment   Glucose-Capillary 128 (*) 70 - 99 mg/dL   GLUCOSE, CAPILLARY     Status: Abnormal   Collection Time   02/06/12  3:12 PM      Component Value Range Comment   Glucose-Capillary 111 (*) 70 - 99 mg/dL   GLUCOSE, CAPILLARY     Status: Abnormal   Collection Time   02/06/12  4:08 PM      Component Value Range Comment   Glucose-Capillary 103 (*) 70 - 99 mg/dL   GLUCOSE, CAPILLARY     Status: Normal   Collection Time   02/06/12  4:50 PM      Component Value Range Comment   Glucose-Capillary 99  70 - 99 mg/dL   GLUCOSE, CAPILLARY     Status: Abnormal   Collection Time   02/06/12  5:59 PM      Component  Value Range Comment   Glucose-Capillary 102 (*) 70 - 99 mg/dL   POCT I-STAT 3, BLOOD GAS (G3+)     Status: Abnormal   Collection Time   02/06/12  6:08 PM      Component Value Range Comment   pH, Arterial 7.392   7.350 - 7.450    pCO2 arterial 36.3  35.0 - 45.0 mmHg    pO2, Arterial 80.0  80.0 - 100.0 mmHg    Bicarbonate 22.2  20.0 - 24.0 mEq/L    TCO2 23  0 - 100 mmol/L    O2 Saturation 96.0      Acid-base deficit 3.0 (*) 0.0 - 2.0 mmol/L    Patient temperature 36.6 C      Collection site RADIAL, ALLEN'S TEST ACCEPTABLE      Drawn by Nurse      Sample type ARTERIAL     GLUCOSE, CAPILLARY     Status: Abnormal   Collection Time   02/06/12  6:54 PM      Component Value Range Comment   Glucose-Capillary 110 (*) 70 - 99 mg/dL   POCT I-STAT 3, BLOOD GAS (G3+)     Status: Normal   Collection Time   02/06/12  6:55 PM      Component Value Range Comment   pH, Arterial 7.363  7.350 - 7.450    pCO2 arterial 40.9  35.0 - 45.0 mmHg    pO2, Arterial 85.0  80.0 - 100.0 mmHg    Bicarbonate 23.3  20.0 - 24.0 mEq/L    TCO2 25  0 - 100 mmol/L    O2 Saturation 96.0      Acid-base deficit 2.0  0.0 - 2.0 mmol/L    Patient temperature 36.9 C      Collection site RADIAL, ALLEN'S TEST ACCEPTABLE      Drawn by Nurse      Sample type ARTERIAL     POCT I-STAT, CHEM 8     Status: Abnormal   Collection Time   02/06/12  7:02 PM      Component Value Range Comment   Sodium 142  135 - 145 mEq/L    Potassium 4.4  3.5 - 5.1 mEq/L    Chloride 107  96 - 112 mEq/L    BUN 24 (*) 6 - 23 mg/dL    Creatinine, Ser 4.09 (*) 0.50 - 1.35 mg/dL    Glucose, Bld 811 (*) 70 - 99 mg/dL    Calcium, Ion 9.14 (*) 1.12 - 1.23 mmol/L    TCO2 22  0 - 100 mmol/L    Hemoglobin 10.9 (*) 13.0 - 17.0 g/dL    HCT 78.2 (*) 95.6 - 52.0 %   GLUCOSE, CAPILLARY     Status: Abnormal   Collection Time   02/06/12  8:27 PM      Component Value Range Comment   Glucose-Capillary 110 (*) 70 - 99 mg/dL   GLUCOSE, CAPILLARY     Status: Abnormal   Collection Time   02/06/12  9:03 PM      Component Value Range Comment   Glucose-Capillary 110 (*) 70 - 99 mg/dL   GLUCOSE, CAPILLARY     Status: Abnormal   Collection Time   02/06/12  9:59 PM       Component Value Range Comment   Glucose-Capillary 104 (*) 70 - 99 mg/dL   GLUCOSE, CAPILLARY     Status: Abnormal   Collection Time   02/06/12 11:44 PM  Component Value Range Comment   Glucose-Capillary 151 (*) 70 - 99 mg/dL   GLUCOSE, CAPILLARY     Status: Abnormal   Collection Time   02/07/12  1:43 AM      Component Value Range Comment   Glucose-Capillary 163 (*) 70 - 99 mg/dL   GLUCOSE, CAPILLARY     Status: Abnormal   Collection Time   02/07/12  4:06 AM      Component Value Range Comment   Glucose-Capillary 184 (*) 70 - 99 mg/dL   CBC     Status: Abnormal   Collection Time   02/07/12  4:10 AM      Component Value Range Comment   WBC 10.1  4.0 - 10.5 K/uL    RBC 3.59 (*) 4.22 - 5.81 MIL/uL    Hemoglobin 10.0 (*) 13.0 - 17.0 g/dL    HCT 16.1 (*) 09.6 - 52.0 %    MCV 83.0  78.0 - 100.0 fL    MCH 27.9  26.0 - 34.0 pg    MCHC 33.6  30.0 - 36.0 g/dL    RDW 04.5  40.9 - 81.1 %    Platelets 211  150 - 400 K/uL   BASIC METABOLIC PANEL     Status: Abnormal   Collection Time   02/07/12  4:10 AM      Component Value Range Comment   Sodium 139  135 - 145 mEq/L    Potassium 4.5  3.5 - 5.1 mEq/L    Chloride 105  96 - 112 mEq/L    CO2 24  19 - 32 mEq/L    Glucose, Bld 185 (*) 70 - 99 mg/dL    BUN 20  6 - 23 mg/dL    Creatinine, Ser 9.14 (*) 0.50 - 1.35 mg/dL    Calcium 7.8 (*) 8.4 - 10.5 mg/dL    GFR calc non Af Amer 49 (*) >90 mL/min    GFR calc Af Amer 57 (*) >90 mL/min   MAGNESIUM     Status: Normal   Collection Time   02/07/12  4:10 AM      Component Value Range Comment   Magnesium 2.1  1.5 - 2.5 mg/dL   GLUCOSE, CAPILLARY     Status: Abnormal   Collection Time   02/07/12  7:35 AM      Component Value Range Comment   Glucose-Capillary 159 (*) 70 - 99 mg/dL   GLUCOSE, CAPILLARY     Status: Abnormal   Collection Time   02/07/12 11:50 AM      Component Value Range Comment   Glucose-Capillary 149 (*) 70 - 99 mg/dL     Imaging: Dg Chest Portable 1 View In  Am  02/07/2012  *RADIOLOGY REPORT*  Clinical Data: Postop CABG.  PORTABLE CHEST - 1 VIEW  Comparison: 02/06/2012  Findings: Interval extubation and removal of NG tube.  Left chest tube and Swan-Ganz catheter remain in place.  No pneumothorax. Bibasilar atelectasis, left greater than right.  IMPRESSION: Interval extubation.  Bibasilar atelectasis.  No pneumothorax.   Original Report Authenticated By: Charlett Nose, M.D.    Dg Chest Portable 1 View  02/06/2012  *RADIOLOGY REPORT*  Clinical Data: Status post CABG.  PORTABLE CHEST - 1 VIEW  Comparison: 01/25/2012  Findings: Postoperatively, endotracheal tube is present with the tip approximately 2.5 cm above the carina.  A left chest tube is in place with no pneumothorax visualized.  Swan-Ganz catheter extends into the main pulmonary artery.  A nasogastric tube  extends into the stomach.  Lungs show low volumes with atelectasis of the left lower lobe.  No overt pulmonary edema.  Gross heart size and mediastinal contours are stable.  IMPRESSION: Low lung volumes and left lower lobe atelectasis postoperatively. No pneumothorax visualized.   Original Report Authenticated By: Irish Lack, M.D.     Assessment:  1. Active Problems: 2.  Coronary arteriography abnormal 3. CAD s/p CABG x 5 vessels. 4. IDDM 5. CKD II 6. HTN  Plan:  1. Doing pretty well after 5 vessel CABG yesterday. Appears to be maintaining sinus rhythm. Major issue is nausea at this point. Some dry heaves. BP stable. Appreciate Dr. Dennie Maizes excellent care.  Time Spent Directly with Patient:  15 minutes  Length of Stay:  LOS: 1 day   Chrystie Nose, MD, [facility], [place] Attending Cardiologist [facility]  [name],[name] 02/07/2012, 1:50 PM

## 2012-02-07 NOTE — CV Procedure (Signed)
Intraoperative Transesophageal Echocardiography Report:  Mr. Carver Murakami is a 56 year old male with long-standing diabetes who presented with symptoms of unstable angina and was found to have significant three-vessel coronary disease that was not amenable to angioplasty. He is now scheduled to undergo coronary artery bypass grafting by Dr. Tyrone Sage. Intraoperative transesophageal echocardiography was requested to evaluate the right and left ventricular function, serve as a monitor for intraoperative volume status, and to determine if any valvular pathology was present.  The patient was brought to the operating room at [facility] and general anesthesia was induced without difficulty. Following endotracheal intubation and orogastric suctioning, the transesophageal echocardiography probe was inserted into the esophagus without difficulty.  Impression: Pre-bypass findings:  1. Aortic valve: The aortic valve was trileaflet. The leaflets were mildly thickened. The leaflets opened normally and there was no aortic insufficiency.  2. Mitral valve:  There was mild mitral annular calcification. The leaflets coapted well without prolapse or flail segments noted. There was trace mitral insufficiency.  3. Left ventricle: There was vigorous contractility in all segments interrogated and ejection fraction was estimated 65%. There were no regional wall motion abnormalities. Left ventricular end-diastolic diameter measured 4.3 cm at the chordal level. There was mild concentric left ventricular hypertrophy with left ventricular wall thickness measured 1.05-1.1 cm at end diastole at the mid-papillary level in the transgastric short axis view.  4. Right ventricle: There was normal right ventricular size and normal contractility the right ventricular free wall.  5. Tricuspid valve: The tricuspid valve appeared structurally normal with trace tricuspid insufficiency  6. Interatrial septum: Interatrial septum  was intact without evidence of patent foramen ovale or atrial septal defect by color Doppler or bubble study.  7. Left atrium: The left atrial cavity appeared to be within normal limits of size and there was no thrombus noted in the left atrium or left atrial appendage.  8. Ascending aorta: The ascending aorta was not dilated and there was no effacement of the sinuses of Valsalva. There was mild thickening of the aortic wall but no significant atheromatous disease appreciated.  9. Descending aorta: The descending aorta measured 2.0 cm in diameter and there is no significant atheromatous disease appreciated.  Post-bypass findings:  1. Aortic valve: The aortic valve was unchanged from the pre-bypass study. There was no aortic insufficiency.  2. Mitral valve: The mitral valve was unchanged from the pre-bypass study there was trace mitral insufficiency.  3. Left ventricle: Upon initial separation from cardiopulmonary bypass, left ventricular pacing was employed and there was a dyssynchronous contractile pattern. However following initiation of atrial pacing, the left atrial contraction appeared unchanged from the pre-bypass study. There was good contractility in all segments interrogated with no regional wall motion abnormalities and ejection fraction was estimated 60-65%.  4. Right ventricle: There was normal right ventricular size and normal contractility of the right ventricular free wall.  Kipp Brood, M.D.

## 2012-02-08 ENCOUNTER — Inpatient Hospital Stay (HOSPITAL_COMMUNITY): Payer: Medicare Other

## 2012-02-08 LAB — CBC
HCT: 28.1 % — ABNORMAL LOW (ref 39.0–52.0)
MCV: 86.2 fL (ref 78.0–100.0)
Platelets: 194 10*3/uL (ref 150–400)
RBC: 3.26 MIL/uL — ABNORMAL LOW (ref 4.22–5.81)
RDW: 14.5 % (ref 11.5–15.5)
WBC: 8.8 10*3/uL (ref 4.0–10.5)

## 2012-02-08 LAB — GLUCOSE, CAPILLARY
Glucose-Capillary: 164 mg/dL — ABNORMAL HIGH (ref 70–99)
Glucose-Capillary: 127 mg/dL — ABNORMAL HIGH (ref 70–99)
Glucose-Capillary: 138 mg/dL — ABNORMAL HIGH (ref 70–99)
Glucose-Capillary: 206 mg/dL — ABNORMAL HIGH (ref 70–99)
Glucose-Capillary: 231 mg/dL — ABNORMAL HIGH (ref 70–99)
Glucose-Capillary: 148 mg/dL — ABNORMAL HIGH (ref 70–99)

## 2012-02-08 LAB — BASIC METABOLIC PANEL
CO2: 27 mEq/L (ref 19–32)
Chloride: 103 mEq/L (ref 96–112)
GFR calc Af Amer: 71 mL/min — ABNORMAL LOW (ref >90)
Potassium: 4 mEq/L (ref 3.5–5.1)
Sodium: 137 mEq/L (ref 135–145)

## 2012-02-08 MED ORDER — ENOXAPARIN SODIUM 30 MG/0.3ML ~~LOC~~ SOLN
30.0000 mg | SUBCUTANEOUS | Status: DC
  Administered 2012-02-08 – 2012-02-11 (×4): 30 mg via SUBCUTANEOUS
  Filled 2012-02-08 (×5): qty 0.3

## 2012-02-08 MED ORDER — ONDANSETRON HCL 4 MG PO TABS
4.0000 mg | ORAL_TABLET | Freq: Four times a day (QID) | ORAL | Status: DC | PRN
  Filled 2012-02-08: qty 1

## 2012-02-08 MED ORDER — METOPROLOL TARTRATE 12.5 MG HALF TABLET
12.5000 mg | ORAL_TABLET | Freq: Two times a day (BID) | ORAL | Status: DC
  Administered 2012-02-08: 12.5 mg via ORAL
  Filled 2012-02-08 (×3): qty 1

## 2012-02-08 MED ORDER — PANTOPRAZOLE SODIUM 40 MG PO TBEC
40.0000 mg | DELAYED_RELEASE_TABLET | Freq: Every day | ORAL | Status: DC
  Administered 2012-02-09 – 2012-02-12 (×4): 40 mg via ORAL
  Filled 2012-02-08 (×4): qty 1

## 2012-02-08 MED ORDER — SODIUM CHLORIDE 0.9 % IV SOLN: 250.0000 mL | INTRAVENOUS | Status: DC | PRN

## 2012-02-08 MED ORDER — ASPIRIN EC 325 MG PO TBEC
325.0000 mg | DELAYED_RELEASE_TABLET | Freq: Every day | ORAL | Status: DC
  Administered 2012-02-09 – 2012-02-12 (×4): 325 mg via ORAL
  Filled 2012-02-08 (×5): qty 1

## 2012-02-08 MED ORDER — BISACODYL 10 MG RE SUPP: 10.0000 mg | Freq: Every day | RECTAL | Status: DC | PRN

## 2012-02-08 MED ORDER — TAMSULOSIN HCL 0.4 MG PO CAPS: 0.4000 mg | ORAL_CAPSULE | Freq: Every day | ORAL | Status: DC

## 2012-02-08 MED ORDER — ONDANSETRON HCL 4 MG/2ML IJ SOLN
4.0000 mg | Freq: Four times a day (QID) | INTRAMUSCULAR | Status: DC | PRN
  Administered 2012-02-09 – 2012-02-10 (×3): 4 mg via INTRAVENOUS
  Filled 2012-02-08 (×2): qty 2

## 2012-02-08 MED ORDER — INSULIN ASPART 100 UNIT/ML ~~LOC~~ SOLN
0.0000 [IU] | Freq: Three times a day (TID) | SUBCUTANEOUS | Status: DC
  Administered 2012-02-08: 2 [IU] via SUBCUTANEOUS
  Administered 2012-02-09 (×2): 4 [IU] via SUBCUTANEOUS
  Administered 2012-02-09: 8 [IU] via SUBCUTANEOUS
  Administered 2012-02-09 – 2012-02-10 (×3): 2 [IU] via SUBCUTANEOUS
  Administered 2012-02-10: 4 [IU] via SUBCUTANEOUS
  Administered 2012-02-11: 2 [IU] via SUBCUTANEOUS
  Administered 2012-02-11: 4 [IU] via SUBCUTANEOUS
  Administered 2012-02-11: 2 [IU] via SUBCUTANEOUS

## 2012-02-08 MED ORDER — SODIUM CHLORIDE 0.9 % IJ SOLN
3.0000 mL | Freq: Two times a day (BID) | INTRAMUSCULAR | Status: DC
  Administered 2012-02-08 – 2012-02-12 (×8): 3 mL via INTRAVENOUS

## 2012-02-08 MED ORDER — MAGNESIUM HYDROXIDE 400 MG/5ML PO SUSP: 30.0000 mL | Freq: Every day | ORAL | Status: DC | PRN

## 2012-02-08 MED ORDER — BISACODYL 5 MG PO TBEC: 10.0000 mg | DELAYED_RELEASE_TABLET | Freq: Every day | ORAL | Status: DC | PRN

## 2012-02-08 MED ORDER — OXYCODONE HCL 5 MG PO TABS
5.0000 mg | ORAL_TABLET | ORAL | Status: DC | PRN
  Administered 2012-02-09: 10 mg via ORAL
  Filled 2012-02-08: qty 2
  Filled 2012-02-08: qty 1

## 2012-02-08 MED ORDER — FERROUS SULFATE 325 (65 FE) MG PO TABS
325.0000 mg | ORAL_TABLET | Freq: Two times a day (BID) | ORAL | Status: DC
  Administered 2012-02-08 – 2012-02-12 (×8): 325 mg via ORAL
  Filled 2012-02-08 (×9): qty 1

## 2012-02-08 MED ORDER — TRAMADOL HCL 50 MG PO TABS
50.0000 mg | ORAL_TABLET | Freq: Two times a day (BID) | ORAL | Status: DC
  Administered 2012-02-08 – 2012-02-12 (×8): 50 mg via ORAL
  Filled 2012-02-08 (×8): qty 1

## 2012-02-08 MED ORDER — POTASSIUM CHLORIDE CRYS ER 10 MEQ PO TBCR
10.0000 meq | EXTENDED_RELEASE_TABLET | Freq: Once | ORAL | Status: AC
  Administered 2012-02-08: 10 meq via ORAL
  Filled 2012-02-08: qty 1

## 2012-02-08 MED ORDER — SODIUM CHLORIDE 0.9 % IJ SOLN
3.0000 mL | INTRAMUSCULAR | Status: DC | PRN
  Administered 2012-02-10: 3 mL via INTRAVENOUS

## 2012-02-08 MED ORDER — FUROSEMIDE 40 MG PO TABS
40.0000 mg | ORAL_TABLET | Freq: Every day | ORAL | Status: AC
  Administered 2012-02-08 – 2012-02-10 (×3): 40 mg via ORAL
  Filled 2012-02-08 (×3): qty 1

## 2012-02-08 MED ORDER — DOCUSATE SODIUM 100 MG PO CAPS
200.0000 mg | ORAL_CAPSULE | Freq: Every day | ORAL | Status: DC
  Administered 2012-02-09 – 2012-02-12 (×4): 200 mg via ORAL
  Filled 2012-02-08 (×4): qty 2

## 2012-02-08 MED ORDER — INSULIN GLARGINE 100 UNIT/ML ~~LOC~~ SOLN
20.0000 [IU] | Freq: Two times a day (BID) | SUBCUTANEOUS | Status: DC
  Administered 2012-02-08 – 2012-02-12 (×8): 20 [IU] via SUBCUTANEOUS

## 2012-02-08 MED ORDER — MOVING RIGHT ALONG BOOK
Freq: Once | Status: AC
  Administered 2012-02-08: 08:00:00
  Filled 2012-02-08: qty 1

## 2012-02-08 MED FILL — Sodium Chloride IV Soln 0.9%: INTRAVENOUS | Qty: 1000 | Status: AC

## 2012-02-08 MED FILL — Lidocaine HCl IV Inj 20 MG/ML: INTRAVENOUS | Qty: 5 | Status: AC

## 2012-02-08 MED FILL — Sodium Chloride Irrigation Soln 0.9%: Qty: 3000 | Status: AC

## 2012-02-08 MED FILL — Electrolyte-R (PH 7.4) Solution: INTRAVENOUS | Qty: 5000 | Status: AC

## 2012-02-08 MED FILL — Sodium Bicarbonate IV Soln 8.4%: INTRAVENOUS | Qty: 50 | Status: AC

## 2012-02-08 MED FILL — Mannitol IV Soln 20%: INTRAVENOUS | Qty: 500 | Status: AC

## 2012-02-08 MED FILL — Heparin Sodium (Porcine) Inj 1000 Unit/ML: INTRAMUSCULAR | Qty: 10 | Status: AC

## 2012-02-08 MED FILL — Heparin Sodium (Porcine) Inj 1000 Unit/ML: INTRAMUSCULAR | Qty: 30 | Status: AC

## 2012-02-08 NOTE — Progress Notes (Signed)
Patient ID: FATEH BEMISS, male   DOB: 1955-10-04, 56 y.o.   MRN: [id number] [facility] DAILY PROGRESS NOTE                   [address]            [place] [address]          [phone]      2 Days Post-Op Procedure(s) (LRB): CORONARY ARTERY BYPASS GRAFTING (CABG) ([name])  Total Length of Stay:  LOS: 2 days   Subjective: Nausea has improved, good pain control no rep difficulity  Objective: Vital signs in last 24 hours: Temp:  [98.1 F (36.7 C)-99.7 F (37.6 C)] 98.1 F (36.7 C) (11/14 0732) Pulse Rate:  [69-87] 78  (11/14 0800) Cardiac Rhythm:  [-] Normal sinus rhythm (11/14 0800) Resp:  [18-28] 20  (11/14 0800) BP: (113-158)/(54-70) 125/58 mmHg (11/14 0800) SpO2:  [90 %-99 %] 95 % (11/14 0800) Arterial Line BP: (148)/(57) 148/57 mmHg (11/13 0845) Weight:  [225 lb 5 oz (102.2 kg)] 225 lb 5 oz (102.2 kg) (11/14 0600)  Filed Weights   02/05/12 1415 02/07/12 0500 02/08/12 0600  Weight: 221 lb 1.9 oz (100.299 kg) 228 lb 2.8 oz (103.5 kg) 225 lb 5 oz (102.2 kg)    Weight change: -2 lb 13.9 oz (-1.3 kg)   Hemodynamic parameters for last 24 hours: PAP: (41)/(17) 41/17 mmHg  Intake/Output from previous day: 11/13 0701 - 11/14 0700 In: 1315.1 [P.O.:330; I.V.:879.1; IV Piggyback:106] Out: 1690 [Urine:1655; Chest Tube:35]  Intake/Output this shift: Total I/O In: 74.7 [I.V.:24.7; IV Piggyback:50] Out: 40 [Urine:40]   Medications Prior to Admission  Medication Sig Dispense Refill  . amLODipine (NORVASC) 5 MG tablet Take 5 mg by mouth daily.      Marland Kitchen aspirin EC 81 MG tablet Take 81 mg by mouth daily.      . cholecalciferol (VITAMIN D) 1000 UNITS tablet Take 1,000 Units by mouth daily.      . fenofibrate 160 MG tablet Take 160 mg by mouth daily.      . ferrous sulfate 325 (65 FE) MG tablet Take 325 mg by mouth 2 (two) times daily.      Marland Kitchen gabapentin (NEURONTIN) 300 MG capsule Take 300 mg by mouth 3 (three) times daily.      . hydrALAZINE (APRESOLINE) 25 MG  tablet Take 1 tablet (25 mg total) by mouth 3 (three) times daily.  90 tablet  5  . insulin aspart (NOVOLOG) 100 UNIT/ML injection Inject 10-15 Units into the skin 3 (three) times daily before meals. Per sliding scale      . insulin glargine (LANTUS) 100 UNIT/ML injection Inject 20 Units into the skin 2 (two) times daily.      . metoprolol succinate (TOPROL-XL) 100 MG 24 hr tablet Take 100 mg by mouth daily. Take with or immediately following a meal.      . omeprazole (PRILOSEC) 20 MG capsule Take 20 mg by mouth daily.      . pravastatin (PRAVACHOL) 40 MG tablet Take 40 mg by mouth daily.      . sertraline (ZOLOFT) 100 MG tablet Take 150 mg by mouth daily.      . Tamsulosin HCl (FLOMAX) 0.4 MG CAPS Take 0.4 mg by mouth daily after supper.       Current Meds: Scheduled Meds:   . acetaminophen  1,000 mg Oral Q6H   Or  . acetaminophen (TYLENOL) oral liquid 160 mg/5 mL  975  mg Per Tube Q6H  . amLODipine  5 mg Oral Daily  . aspirin EC  325 mg Oral Daily   Or  . aspirin  324 mg Per Tube Daily  . bisacodyl  10 mg Oral Daily   Or  . bisacodyl  10 mg Rectal Daily  . [COMPLETED] cefUROXime (ZINACEF)  IV  1.5 g Intravenous Q12H  . Chlorhexidine Gluconate Cloth  6 each Topical Daily  . cholecalciferol  1,000 Units Oral Daily  . docusate sodium  200 mg Oral Daily  . [EXPIRED] famotidine (PEPCID) IV  20 mg Intravenous Q12H  . fenofibrate  160 mg Oral Daily  . gabapentin  300 mg Oral TID  . insulin aspart  0-24 Units Subcutaneous Q4H  . [COMPLETED] metoCLOPramide (REGLAN) injection  10 mg Intravenous Q6H  . metoprolol tartrate  12.5 mg Oral BID   Or  . metoprolol tartrate  12.5 mg Per Tube BID  . mupirocin ointment  1 application Nasal BID  . pantoprazole  40 mg Oral Daily  . promethazine  12.5 mg Intravenous Once  . sertraline  150 mg Oral Daily  . simvastatin  20 mg Oral q1800  . sodium chloride  3 mL Intravenous Q12H  . Tamsulosin HCl  0.4 mg Oral QHS   Continuous Infusions:   .  sodium chloride 20 mL/hr at 02/06/12 1900  . sodium chloride 20 mL/hr at 02/08/12 0000  . sodium chloride    . dexmedetomidine Stopped (02/06/12 1620)  . DOPamine 2.5 mcg/kg/min (02/07/12 2251)  . insulin (NOVOLIN-R) infusion 1.5 Units/hr (02/06/12 1900)  . lactated ringers 50 mL/hr at 02/06/12 1845  . nitroGLYCERIN Stopped (02/07/12 0630)  . phenylephrine (NEO-SYNEPHRINE) Adult infusion Stopped (02/06/12 1730)   PRN Meds:.[EXPIRED] albumin human, fentaNYL, insulin (NOVOLIN-R) infusion, metoprolol, midazolam, ondansetron (ZOFRAN) IV, oxyCODONE, sodium chloride  General appearance: alert and cooperative Neurologic: intact Heart: regular rate and rhythm, S1, S2 normal, no murmur, click, rub or gallop and normal apical impulse Lungs: clear to auscultation bilaterally and normal percussion bilaterally Abdomen: soft, non-tender; bowel sounds normal; no masses,  no organomegaly Extremities: extremities normal, atraumatic, no cyanosis or edema and Homans sign is negative, no sign of DVT Wound: sternum stable  Lab Results: CBC: Basename 02/08/12 0340 02/07/12 1716 02/07/12 1647  WBC 8.8 -- 9.5  HGB 9.3* 10.2* --  HCT 28.1* 30.0* --  PLT 194 -- 213   BMET:  Basename 02/08/12 0340 02/07/12 1716 02/07/12 0410  NA 137 140 --  K 4.0 4.5 --  CL 103 102 --  CO2 27 -- 24  GLUCOSE 137* 196* --  BUN 17 19 --  CREATININE 1.28 1.60* --  CALCIUM 8.0* -- 7.8*    PT/INR:  Basename 02/06/12 1330  LABPROT 17.1*  INR 1.43   Radiology: Dg Chest Portable 1 View In Am  02/08/2012  *RADIOLOGY REPORT*  Clinical Data: Postop CABG  PORTABLE CHEST - 1 VIEW  Comparison: 02/07/2012; 02/06/2012; 01/24/2029  Findings: Grossly unchanged cardiac silhouette and mediastinal contours post median sternotomy and CABG.  Interval removal of right jugular approach a PA catheter with remaining vascular sheath tip overlying the mid SVC.  Interval removal of left-sided chest tube.  No definite pneumothorax.  Pulmonary  vasculature remains indistinct.  Likely unchanged trace bilateral effusions.  Bibasilar linear heterogeneous opacities are grossly unchanged, left greater than right.  No new focal airspace opacities.  Unchanged bones.  IMPRESSION: 1.  Remaining right jugular approach vascular sheath tip projects over the mid SVC.  Otherwise, interval removal of remaining support apparatus.  No definite pneumothorax. 2.  Grossly unchanged findings of mild pulmonary edema, trace bilateral effusions and bibasilar opacities, left greater than right, likely atelectasis.   Original Report Authenticated By: Tacey Ruiz, MD    Dg Chest Portable 1 View In Am  02/07/2012  *RADIOLOGY REPORT*  Clinical Data: Postop CABG.  PORTABLE CHEST - 1 VIEW  Comparison: 02/06/2012  Findings: Interval extubation and removal of NG tube.  Left chest tube and Swan-Ganz catheter remain in place.  No pneumothorax. Bibasilar atelectasis, left greater than right.  IMPRESSION: Interval extubation.  Bibasilar atelectasis.  No pneumothorax.   Original Report Authenticated By: Charlett Nose, M.D.    Dg Chest Portable 1 View  02/06/2012  *RADIOLOGY REPORT*  Clinical Data: Status post CABG.  PORTABLE CHEST - 1 VIEW  Comparison: 01/25/2012  Findings: Postoperatively, endotracheal tube is present with the tip approximately 2.5 cm above the carina.  A left chest tube is in place with no pneumothorax visualized.  Swan-Ganz catheter extends into the main pulmonary artery.  A nasogastric tube extends into the stomach.  Lungs show low volumes with atelectasis of the left lower lobe.  No overt pulmonary edema.  Gross heart size and mediastinal contours are stable.  IMPRESSION: Low lung volumes and left lower lobe atelectasis postoperatively. No pneumothorax visualized.   Original Report Authenticated By: Irish Lack, M.D.      Assessment/Plan: S/P Procedure(s) (LRB): CORONARY ARTERY BYPASS GRAFTING (CABG) ([name]) Mobilize Diuresis Diabetes control Plan for  transfer to step-down: see transfer orders Expected Acute  Blood - loss Anemia Stable chronic stage iii renal insufficiency    Kennett Symes B 02/08/2012 8:33 AM

## 2012-02-08 NOTE — Progress Notes (Signed)
CBG 231 after clear liquid diet. Nausea better today diet advanced to carb modified diet

## 2012-02-08 NOTE — Progress Notes (Signed)
Subjective:  No CP/SOB. POD # 2 CABG X 5  Objective:  Temp:  [97.9 F (36.6 C)-99.7 F (37.6 C)] 97.9 F (36.6 C) (11/14 1210) Pulse Rate:  [71-87] 71  (11/14 1400) Resp:  [18-26] 20  (11/14 1400) BP: (113-167)/(54-77) 128/58 mmHg (11/14 1400) SpO2:  [91 %-99 %] 97 % (11/14 1400) Weight:  [102.2 kg (225 lb 5 oz)] 102.2 kg (225 lb 5 oz) (11/14 0600) Weight change: -1.3 kg (-2 lb 13.9 oz)  Intake/Output from previous day: 11/13 0701 - 11/14 0700 In: 1315.1 [P.O.:330; I.V.:879.1; IV Piggyback:106] Out: 1690 [Urine:1655; Chest Tube:35]  Intake/Output from this shift: Total I/O In: 556.7 [P.O.:400; I.V.:106.7; IV Piggyback:50] Out: 615 [Urine:615]  Physical Exam: General appearance: alert, cooperative and no distress Neck: no adenopathy, no carotid bruit, no JVD, supple, symmetrical, trachea midline and thyroid not enlarged, symmetric, no tenderness/mass/nodules Lungs: clear to auscultation bilaterally Heart: regular rate and rhythm, S1, S2 normal, no murmur, click, rub or gallop Extremities: 2 + pitting edema  Lab Results: Results for orders placed during the hospital encounter of 02/06/12 (from the past 48 hour(s))  GLUCOSE, CAPILLARY     Status: Abnormal   Collection Time   02/06/12  5:59 PM      Component Value Range Comment   Glucose-Capillary 102 (*) 70 - 99 mg/dL   POCT I-STAT 3, BLOOD GAS (G3+)     Status: Abnormal   Collection Time   02/06/12  6:08 PM      Component Value Range Comment   pH, Arterial 7.392  7.350 - 7.450    pCO2 arterial 36.3  35.0 - 45.0 mmHg    pO2, Arterial 80.0  80.0 - 100.0 mmHg    Bicarbonate 22.2  20.0 - 24.0 mEq/L    TCO2 23  0 - 100 mmol/L    O2 Saturation 96.0      Acid-base deficit 3.0 (*) 0.0 - 2.0 mmol/L    Patient temperature 36.6 C      Collection site RADIAL, ALLEN'S TEST ACCEPTABLE      Drawn by Nurse      Sample type ARTERIAL     GLUCOSE, CAPILLARY     Status: Abnormal   Collection Time   02/06/12  6:54 PM      Component  Value Range Comment   Glucose-Capillary 110 (*) 70 - 99 mg/dL   POCT I-STAT 3, BLOOD GAS (G3+)     Status: Normal   Collection Time   02/06/12  6:55 PM      Component Value Range Comment   pH, Arterial 7.363  7.350 - 7.450    pCO2 arterial 40.9  35.0 - 45.0 mmHg    pO2, Arterial 85.0  80.0 - 100.0 mmHg    Bicarbonate 23.3  20.0 - 24.0 mEq/L    TCO2 25  0 - 100 mmol/L    O2 Saturation 96.0      Acid-base deficit 2.0  0.0 - 2.0 mmol/L    Patient temperature 36.9 C      Collection site RADIAL, ALLEN'S TEST ACCEPTABLE      Drawn by Nurse      Sample type ARTERIAL     POCT I-STAT, CHEM 8     Status: Abnormal   Collection Time   02/06/12  7:02 PM      Component Value Range Comment   Sodium 142  135 - 145 mEq/L    Potassium 4.4  3.5 - 5.1 mEq/L    Chloride 107  96 - 112 mEq/L    BUN 24 (*) 6 - 23 mg/dL    Creatinine, Ser 2.13 (*) 0.50 - 1.35 mg/dL    Glucose, Bld 086 (*) 70 - 99 mg/dL    Calcium, Ion 5.78 (*) 1.12 - 1.23 mmol/L    TCO2 22  0 - 100 mmol/L    Hemoglobin 10.9 (*) 13.0 - 17.0 g/dL    HCT 46.9 (*) 62.9 - 52.0 %   GLUCOSE, CAPILLARY     Status: Abnormal   Collection Time   02/06/12  8:27 PM      Component Value Range Comment   Glucose-Capillary 110 (*) 70 - 99 mg/dL   GLUCOSE, CAPILLARY     Status: Abnormal   Collection Time   02/06/12  9:03 PM      Component Value Range Comment   Glucose-Capillary 110 (*) 70 - 99 mg/dL   GLUCOSE, CAPILLARY     Status: Abnormal   Collection Time   02/06/12  9:59 PM      Component Value Range Comment   Glucose-Capillary 104 (*) 70 - 99 mg/dL   GLUCOSE, CAPILLARY     Status: Abnormal   Collection Time   02/06/12 11:44 PM      Component Value Range Comment   Glucose-Capillary 151 (*) 70 - 99 mg/dL   GLUCOSE, CAPILLARY     Status: Abnormal   Collection Time   02/07/12  1:43 AM      Component Value Range Comment   Glucose-Capillary 163 (*) 70 - 99 mg/dL   GLUCOSE, CAPILLARY     Status: Abnormal   Collection Time   02/07/12   4:06 AM      Component Value Range Comment   Glucose-Capillary 184 (*) 70 - 99 mg/dL   CBC     Status: Abnormal   Collection Time   02/07/12  4:10 AM      Component Value Range Comment   WBC 10.1  4.0 - 10.5 K/uL    RBC 3.59 (*) 4.22 - 5.81 MIL/uL    Hemoglobin 10.0 (*) 13.0 - 17.0 g/dL    HCT 52.8 (*) 41.3 - 52.0 %    MCV 83.0  78.0 - 100.0 fL    MCH 27.9  26.0 - 34.0 pg    MCHC 33.6  30.0 - 36.0 g/dL    RDW 24.4  01.0 - 27.2 %    Platelets 211  150 - 400 K/uL   BASIC METABOLIC PANEL     Status: Abnormal   Collection Time   02/07/12  4:10 AM      Component Value Range Comment   Sodium 139  135 - 145 mEq/L    Potassium 4.5  3.5 - 5.1 mEq/L    Chloride 105  96 - 112 mEq/L    CO2 24  19 - 32 mEq/L    Glucose, Bld 185 (*) 70 - 99 mg/dL    BUN 20  6 - 23 mg/dL    Creatinine, Ser 5.36 (*) 0.50 - 1.35 mg/dL    Calcium 7.8 (*) 8.4 - 10.5 mg/dL    GFR calc non Af Amer 49 (*) >90 mL/min    GFR calc Af Amer 57 (*) >90 mL/min   MAGNESIUM     Status: Normal   Collection Time   02/07/12  4:10 AM      Component Value Range Comment   Magnesium 2.1  1.5 - 2.5 mg/dL   GLUCOSE, CAPILLARY  Status: Abnormal   Collection Time   02/07/12  7:35 AM      Component Value Range Comment   Glucose-Capillary 159 (*) 70 - 99 mg/dL   GLUCOSE, CAPILLARY     Status: Abnormal   Collection Time   02/07/12 11:50 AM      Component Value Range Comment   Glucose-Capillary 149 (*) 70 - 99 mg/dL   GLUCOSE, CAPILLARY     Status: Abnormal   Collection Time   02/07/12  3:30 PM      Component Value Range Comment   Glucose-Capillary 176 (*) 70 - 99 mg/dL   MAGNESIUM     Status: Normal   Collection Time   02/07/12  4:47 PM      Component Value Range Comment   Magnesium 2.2  1.5 - 2.5 mg/dL   CBC     Status: Abnormal   Collection Time   02/07/12  4:47 PM      Component Value Range Comment   WBC 9.5  4.0 - 10.5 K/uL    RBC 3.54 (*) 4.22 - 5.81 MIL/uL    Hemoglobin 9.9 (*) 13.0 - 17.0 g/dL    HCT 40.9  (*) 81.1 - 52.0 %    MCV 84.2  78.0 - 100.0 fL    MCH 28.0  26.0 - 34.0 pg    MCHC 33.2  30.0 - 36.0 g/dL    RDW 91.4  78.2 - 95.6 %    Platelets 213  150 - 400 K/uL   CREATININE, SERUM     Status: Abnormal   Collection Time   02/07/12  4:47 PM      Component Value Range Comment   Creatinine, Ser 1.43 (*) 0.50 - 1.35 mg/dL    GFR calc non Af Amer 53 (*) >90 mL/min    GFR calc Af Amer 62 (*) >90 mL/min   POCT I-STAT, CHEM 8     Status: Abnormal   Collection Time   02/07/12  5:16 PM      Component Value Range Comment   Sodium 140  135 - 145 mEq/L    Potassium 4.5  3.5 - 5.1 mEq/L    Chloride 102  96 - 112 mEq/L    BUN 19  6 - 23 mg/dL    Creatinine, Ser 2.13 (*) 0.50 - 1.35 mg/dL    Glucose, Bld 086 (*) 70 - 99 mg/dL    Calcium, Ion 5.78 (*) 1.12 - 1.23 mmol/L    TCO2 24  0 - 100 mmol/L    Hemoglobin 10.2 (*) 13.0 - 17.0 g/dL    HCT 46.9 (*) 62.9 - 52.0 %   GLUCOSE, CAPILLARY     Status: Abnormal   Collection Time   02/07/12  7:33 PM      Component Value Range Comment   Glucose-Capillary 164 (*) 70 - 99 mg/dL    Comment 1 Documented in Chart      Comment 2 Notify RN     GLUCOSE, CAPILLARY     Status: Abnormal   Collection Time   02/07/12 11:27 PM      Component Value Range Comment   Glucose-Capillary 148 (*) 70 - 99 mg/dL    Comment 1 Notify RN      Comment 2 Documented in Chart     GLUCOSE, CAPILLARY     Status: Abnormal   Collection Time   02/08/12  3:29 AM      Component Value Range Comment   Glucose-Capillary  127 (*) 70 - 99 mg/dL   BASIC METABOLIC PANEL     Status: Abnormal   Collection Time   02/08/12  3:40 AM      Component Value Range Comment   Sodium 137  135 - 145 mEq/L    Potassium 4.0  3.5 - 5.1 mEq/L    Chloride 103  96 - 112 mEq/L    CO2 27  19 - 32 mEq/L    Glucose, Bld 137 (*) 70 - 99 mg/dL    BUN 17  6 - 23 mg/dL    Creatinine, Ser 6.21  0.50 - 1.35 mg/dL    Calcium 8.0 (*) 8.4 - 10.5 mg/dL    GFR calc non Af Amer 61 (*) >90 mL/min    GFR calc  Af Amer 71 (*) >90 mL/min   CBC     Status: Abnormal   Collection Time   02/08/12  3:40 AM      Component Value Range Comment   WBC 8.8  4.0 - 10.5 K/uL    RBC 3.26 (*) 4.22 - 5.81 MIL/uL    Hemoglobin 9.3 (*) 13.0 - 17.0 g/dL    HCT 30.8 (*) 65.7 - 52.0 %    MCV 86.2  78.0 - 100.0 fL    MCH 28.5  26.0 - 34.0 pg    MCHC 33.1  30.0 - 36.0 g/dL    RDW 84.6  96.2 - 95.2 %    Platelets 194  150 - 400 K/uL   GLUCOSE, CAPILLARY     Status: Abnormal   Collection Time   02/08/12  7:30 AM      Component Value Range Comment   Glucose-Capillary 138 (*) 70 - 99 mg/dL    Comment 1 Documented in Chart      Comment 2 Notify RN     GLUCOSE, CAPILLARY     Status: Abnormal   Collection Time   02/08/12 12:09 PM      Component Value Range Comment   Glucose-Capillary 206 (*) 70 - 99 mg/dL    Comment 1 Documented in Chart      Comment 2 Notify RN     GLUCOSE, CAPILLARY     Status: Abnormal   Collection Time   02/08/12  3:58 PM      Component Value Range Comment   Glucose-Capillary 231 (*) 70 - 99 mg/dL     Imaging: Imaging results have been reviewed  Assessment/Plan:   1. Active Problems: 2.  DIABETES MELLITUS, TYPE I 3.  HYPERTENSION 4.  GERD 5.  Dyslipidemia 6.  Chronic kidney disease 7.  Coronary arteriography abnormal 8.   Time Spent Directly with Patient:  20 minutes  Length of Stay:  LOS: 2 days   Looks great. NSR. POD # 2 CABG X 5. NSR. Labs OK. Cont gentle diuresis. Runell Gess 02/08/2012, 5:30 PM

## 2012-02-08 NOTE — Progress Notes (Signed)
Patient without nausea overnight, tolerating PO's.  Advanced to clear liquid diet.

## 2012-02-09 ENCOUNTER — Inpatient Hospital Stay (HOSPITAL_COMMUNITY): Payer: Medicare Other

## 2012-02-09 LAB — TYPE AND SCREEN
ABO/RH(D): O POS
Antibody Screen: NEGATIVE
Unit division: 0
Unit division: 0

## 2012-02-09 LAB — BASIC METABOLIC PANEL
BUN: 21 mg/dL (ref 6–23)
CO2: 29 mEq/L (ref 19–32)
Calcium: 8.5 mg/dL (ref 8.4–10.5)
Chloride: 98 mEq/L (ref 96–112)
Creatinine, Ser: 1.66 mg/dL — ABNORMAL HIGH (ref 0.50–1.35)
GFR calc Af Amer: 52 mL/min — ABNORMAL LOW (ref >90)
GFR calc non Af Amer: 45 mL/min — ABNORMAL LOW (ref >90)
Glucose, Bld: 197 mg/dL — ABNORMAL HIGH (ref 70–99)
Potassium: 4.2 mEq/L (ref 3.5–5.1)
Sodium: 134 mEq/L — ABNORMAL LOW (ref 135–145)

## 2012-02-09 LAB — CBC
HCT: 27.5 % — ABNORMAL LOW (ref 39.0–52.0)
Hemoglobin: 8.9 g/dL — ABNORMAL LOW (ref 13.0–17.0)
MCH: 28.1 pg (ref 26.0–34.0)
MCHC: 32.4 g/dL (ref 30.0–36.0)
MCV: 86.8 fL (ref 78.0–100.0)
Platelets: 233 10*3/uL (ref 150–400)
RBC: 3.17 MIL/uL — ABNORMAL LOW (ref 4.22–5.81)
RDW: 14.4 % (ref 11.5–15.5)
WBC: 9 10*3/uL (ref 4.0–10.5)

## 2012-02-09 LAB — GLUCOSE, CAPILLARY
Glucose-Capillary: 197 mg/dL — ABNORMAL HIGH (ref 70–99)
Glucose-Capillary: 276 mg/dL — ABNORMAL HIGH (ref 70–99)
Glucose-Capillary: 172 mg/dL — ABNORMAL HIGH (ref 70–99)
Glucose-Capillary: 158 mg/dL — ABNORMAL HIGH (ref 70–99)

## 2012-02-09 MED ORDER — METOPROLOL TARTRATE 25 MG PO TABS: 25.0000 mg | ORAL_TABLET | Freq: Two times a day (BID) | ORAL | Status: DC

## 2012-02-09 MED ORDER — METOPROLOL TARTRATE 25 MG PO TABS
25.0000 mg | ORAL_TABLET | Freq: Two times a day (BID) | ORAL | Status: DC
  Administered 2012-02-09 – 2012-02-12 (×7): 25 mg via ORAL
  Filled 2012-02-09 (×8): qty 1

## 2012-02-09 MED ORDER — INSULIN ASPART 100 UNIT/ML ~~LOC~~ SOLN
8.0000 [IU] | Freq: Three times a day (TID) | SUBCUTANEOUS | Status: DC
  Administered 2012-02-09 – 2012-02-12 (×9): 8 [IU] via SUBCUTANEOUS

## 2012-02-09 MED ORDER — ASPIRIN 325 MG PO TBEC: 325.0000 mg | DELAYED_RELEASE_TABLET | Freq: Every day | ORAL | Status: DC

## 2012-02-09 MED ORDER — OXYCODONE HCL 5 MG PO TABS: 5.0000 mg | ORAL_TABLET | ORAL | Status: DC | PRN

## 2012-02-09 NOTE — Progress Notes (Signed)
[facility]  Subjective: No complaints.  No SOB.  Objective: Vital signs in last 24 hours: Temp:  [97.9 F (36.6 C)-99.7 F (37.6 C)] 98.8 F (37.1 C) (11/15 0627) Pulse Rate:  [71-87] 81  (11/15 0605) Resp:  [19-26] 20  (11/15 0605) BP: (122-167)/(56-93) 122/70 mmHg (11/15 0627) SpO2:  [96 %-99 %] 99 % (11/15 0605) Weight:  [99.791 kg (220 lb)] 99.791 kg (220 lb) (11/15 0605)    Intake/Output from previous day: 11/14 0701 - 11/15 0700 In: 796.7 [P.O.:640; I.V.:106.7; IV Piggyback:50] Out: 790 [Urine:790] Intake/Output this shift:    Medications Current Facility-Administered Medications  Medication Dose Route Frequency Provider Last Rate Last Dose  . 0.9 %  sodium chloride infusion  250 mL Intravenous PRN Delight Ovens, MD      . amLODipine (NORVASC) tablet 5 mg  5 mg Oral Daily Rowe Clack, [place]   5 mg at 02/08/12 0942  . aspirin EC tablet 325 mg  325 mg Oral Daily Delight Ovens, MD      . bisacodyl (DULCOLAX) EC tablet 10 mg  10 mg Oral Daily PRN Delight Ovens, MD       Or  . bisacodyl (DULCOLAX) suppository 10 mg  10 mg Rectal Daily PRN Delight Ovens, MD      . cholecalciferol (VITAMIN D) tablet 1,000 Units  1,000 Units Oral Daily Wilmon Pali, PA   1,000 Units at 02/08/12 [phone]  . docusate sodium (COLACE) capsule 200 mg  200 mg Oral Daily Delight Ovens, MD      . enoxaparin (LOVENOX) injection 30 mg  30 mg Subcutaneous Q24H Delight Ovens, MD   30 mg at 02/08/12 1904  . fenofibrate tablet 160 mg  160 mg Oral Daily Wilmon Pali, PA   160 mg at 02/08/12 0943  . ferrous sulfate tablet 325 mg  325 mg Oral BID Delight Ovens, MD   325 mg at 02/08/12 2125  . furosemide (LASIX) tablet 40 mg  40 mg Oral Daily Delight Ovens, MD   40 mg at 02/08/12 1904  . gabapentin (NEURONTIN) capsule 300 mg  300 mg Oral TID Wilmon Pali, PA   300 mg at 02/08/12 2125  . insulin aspart (novoLOG) injection 0-24 Units  0-24 Units  Subcutaneous TID AC & HS Delight Ovens, MD   4 Units at 02/09/12 [phone]  . insulin aspart (novoLOG) injection 8 Units  8 Units Subcutaneous TID WC Wilmon Pali, PA      . insulin glargine (LANTUS) injection 20 Units  20 Units Subcutaneous BID Delight Ovens, MD   20 Units at 02/08/12 2131  . magnesium hydroxide (MILK OF MAGNESIA) suspension 30 mL  30 mL Oral Daily PRN Delight Ovens, MD      . metoprolol tartrate (LOPRESSOR) tablet 25 mg  25 mg Oral BID Wilmon Pali, PA      . moving right along book   Does not apply Once Delight Ovens, MD      . mupirocin ointment (BACTROBAN) 2 % 1 application  1 application Nasal BID Delight Ovens, MD   1 application at 02/08/12 2125  . ondansetron (ZOFRAN) tablet 4 mg  4 mg Oral Q6H PRN Delight Ovens, MD       Or  . ondansetron [facility]) injection 4 mg  4 mg Intravenous Q6H PRN Delight Ovens, MD      . oxyCODONE (  Oxy IR/ROXICODONE) immediate release tablet 5-10 mg  5-10 mg Oral Q3H PRN Delight Ovens, MD      . pantoprazole (PROTONIX) EC tablet 40 mg  40 mg Oral QAC breakfast Delight Ovens, MD   40 mg at 02/09/12 [phone]  . [COMPLETED] potassium chloride (K-DUR,KLOR-CON) CR tablet 10 mEq  10 mEq Oral Once Delight Ovens, MD   10 mEq at 02/08/12 1904  . sertraline (ZOLOFT) tablet 150 mg  150 mg Oral Daily Wilmon Pali, PA   150 mg at 02/08/12 0944  . simvastatin (ZOCOR) tablet 20 mg  20 mg Oral q1800 Wilmon Pali, PA   20 mg at 02/08/12 1904  . sodium chloride 0.9 % injection 3 mL  3 mL Intravenous Q12H Delight Ovens, MD   3 mL at 02/08/12 2200  . sodium chloride 0.9 % injection 3 mL  3 mL Intravenous PRN Delight Ovens, MD      . Tamsulosin HCl [facility]) capsule 0.4 mg  0.4 mg Oral QHS Rowe Clack, PA   0.4 mg at 02/08/12 2125  . traMADol (ULTRAM) tablet 50 mg  50 mg Oral Q12H Delight Ovens, MD   50 mg at 02/08/12 2124  . [DISCONTINUED] 0.45 % sodium chloride infusion   Intravenous Continuous Wilmon Pali, PA  20 mL/hr at 02/06/12 1900    . [DISCONTINUED] 0.9 %  sodium chloride infusion   Intravenous Continuous Wilmon Pali, PA 20 mL/hr at 02/08/12 0000    . [DISCONTINUED] 0.9 %  sodium chloride infusion  250 mL Intravenous Continuous Wilmon Pali, PA      . [DISCONTINUED] acetaminophen (TYLENOL) solution 975 mg  975 mg Per Tube Q6H Wilmon Pali, PA      . [DISCONTINUED] acetaminophen (TYLENOL) tablet 1,000 mg  1,000 mg Oral Q6H Wilmon Pali, PA   1,000 mg at 02/08/12 1201  . [DISCONTINUED] aspirin chewable tablet 324 mg  324 mg Per Tube Daily Wilmon Pali, PA      . [DISCONTINUED] aspirin EC tablet 325 mg  325 mg Oral Daily Wilmon Pali, PA   325 mg at 02/08/12 0942  . [DISCONTINUED] bisacodyl (DULCOLAX) EC tablet 10 mg  10 mg Oral Daily Wilmon Pali, PA   10 mg at 02/08/12 0937  . [DISCONTINUED] bisacodyl (DULCOLAX) suppository 10 mg  10 mg Rectal Daily Wilmon Pali, PA      . [DISCONTINUED] Chlorhexidine Gluconate Cloth 2 % PADS 6 each  6 each Topical Daily Delight Ovens, MD   6 each at 02/08/12 [id number]  . [DISCONTINUED] dexmedetomidine (PRECEDEX) 200 mcg / 50 mL infusion  0.1-0.7 mcg/kg/hr Intravenous Continuous Wilmon Pali, PA   0.1 mcg/kg/hr at 02/06/12 1600  . [DISCONTINUED] docusate sodium (COLACE) capsule 200 mg  200 mg Oral Daily Wilmon Pali, PA   200 mg at 02/08/12 0937  . [DISCONTINUED] DOPamine (INTROPIN) 800 mg in dextrose 5 % 250 mL infusion  2.5 mcg/kg/min Intravenous Titrated Wilmon Pali, PA 4.7 mL/hr at 02/07/12 2251 2.5 mcg/kg/min at 02/07/12 2251  . [DISCONTINUED] fentaNYL (SUBLIMAZE) injection 50-100 mcg  50-100 mcg Intravenous Q1H PRN Alleen Borne, MD   100 mcg at 02/08/12 0937  . [DISCONTINUED] insulin aspart (novoLOG) injection 0-24 Units  0-24 Units Subcutaneous Q4H Rowe Clack, PA   8 Units at 02/08/12 1601  . [DISCONTINUED] insulin regular (NOVOLIN R,HUMULIN R) 1 Units/mL in sodium chloride 0.9 % 100 mL infusion  Intravenous Continuous PRN Delight Ovens, MD 1.5 mL/hr at 02/06/12 1900 1.5 Units/hr at 02/06/12 1900  . [DISCONTINUED] lactated ringers infusion   Intravenous Continuous Wilmon Pali, PA 50 mL/hr at 02/06/12 1845    . [DISCONTINUED] metoprolol (LOPRESSOR) injection 2.5-5 mg  2.5-5 mg Intravenous Q2H PRN Wilmon Pali, PA   5 mg at 02/07/12 0135  . [DISCONTINUED] metoprolol tartrate (LOPRESSOR) 25 mg/10 mL oral suspension 12.5 mg  12.5 mg Per Tube BID Wilmon Pali, PA   12.5 mg at 02/07/12 2123  . [DISCONTINUED] metoprolol tartrate (LOPRESSOR) tablet 12.5 mg  12.5 mg Oral BID Wilmon Pali, PA   12.5 mg at 02/08/12 0944  . [DISCONTINUED] metoprolol tartrate (LOPRESSOR) tablet 12.5 mg  12.5 mg Oral BID Delight Ovens, MD   12.5 mg at 02/08/12 2125  . [DISCONTINUED] midazolam (VERSED) injection 2 mg  2 mg Intravenous Q1H PRN Wilmon Pali, PA      . [DISCONTINUED] nitroGLYCERIN 0.2 mg/mL in dextrose 5 % infusion  0-100 mcg/min Intravenous Continuous Wilmon Pali, PA   10 mcg/min at 02/07/12 0600  . [DISCONTINUED] ondansetron (ZOFRAN) injection 4 mg  4 mg Intravenous Q6H PRN Wilmon Pali, PA   4 mg at 02/08/12 0615  . [DISCONTINUED] oxyCODONE (Oxy IR/ROXICODONE) immediate release tablet 5-10 mg  5-10 mg Oral Q3H PRN Wilmon Pali, PA   10 mg at 02/08/12 1553  . [DISCONTINUED] pantoprazole (PROTONIX) EC tablet 40 mg  40 mg Oral Daily Wilmon Pali, PA   40 mg at 02/08/12 0937  . [DISCONTINUED] phenylephrine (NEO-SYNEPHRINE) 20,000 mcg in dextrose 5 % 250 mL infusion  0-100 mcg/min Intravenous Continuous Wilmon Pali, PA   5 mcg/min at 02/06/12 1700  . [DISCONTINUED] promethazine (PHENERGAN) injection 12.5 mg  12.5 mg Intravenous Once Delight Ovens, MD      . [DISCONTINUED] sodium chloride 0.9 % injection 3 mL  3 mL Intravenous Q12H Wilmon Pali, PA   3 mL at 02/08/12 0945  . [DISCONTINUED] sodium chloride 0.9 % injection 3 mL  3 mL Intravenous PRN Wilmon Pali, PA      . [DISCONTINUED] Tamsulosin HCl (FLOMAX)  capsule 0.4 mg  0.4 mg Oral QPC supper Delight Ovens, MD        PE: General appearance: alert, cooperative and no distress Lungs: clear to auscultation bilaterally Heart: regular rate and rhythm, S1, S2 normal, no murmur, click, rub or gallop Extremities: 1+ left LEE, 2+ right LEE. Pulses: 1+ and symmetric Neurologic: Grossly normal  Lab Results:   Basename 02/09/12 0500 02/08/12 0340 02/07/12 1716 02/07/12 1647  WBC 9.0 8.8 -- 9.5  HGB 8.9* 9.3* 10.2* --  HCT 27.5* 28.1* 30.0* --  PLT 233 194 -- 213   BMET  Basename 02/09/12 0500 02/08/12 0340 02/07/12 1716 02/07/12 0410  NA 134* 137 140 --  K 4.2 4.0 4.5 --  CL 98 103 102 --  CO2 29 27 -- 24  GLUCOSE 197* 137* 196* --  BUN 21 17 19  --  CREATININE 1.66* 1.28 1.60* --  CALCIUM 8.5 8.0* -- 7.8*   PT/INR  Basename 02/06/12 1330  LABPROT 17.1*  INR 1.43   CHEST - 2 VIEW  Comparison: 02/08/2012  Findings: Prior CABG. Cardiomegaly. Bibasilar atelectasis,  similar prior study. Small bilateral effusions. No pneumothorax.  IMPRESSION:  Low lung volumes with bibasilar atelectasis. Small effusions.  Assessment/Plan  Active Problems:  DIABETES MELLITUS, TYPE I  HYPERTENSION  GERD  Dyslipidemia  Chronic kidney disease  Coronary arteriography abnormal  Plan:  POD#3- CABG x5.  BP and HR stable.   Slight decrease in Hgb, but stable.   Net fluids: - in the last 24 hours.  Small pleural eff on today's CXR.   LOS: 3 days    Andros Channing 02/09/2012 8:37 AM

## 2012-02-09 NOTE — Progress Notes (Signed)
I seen and evaluated the patient this morning along with Wilburt Finlay, PA. I agree with his findings, examination as well as impression recommendations.  Progressing well 3 Days Post-Op Procedure(s) (LRB):  CORONARY ARTERY BYPASS GRAFTING (CABG)   Renal function looks a bit worse today. BP & HR are stable, no Afib or other arrythmia. On BB, CCB On statin & fibrate for HLD Glycemic control adjusted by CVTS. H/H a bit down post-op --  Iron supplement started.  Marykay Lex, M.D., M.S. [facility] [address] [place], [place]  [address]  [phone] Pager # [phone] 02/09/2012 10:26 AM

## 2012-02-09 NOTE — Progress Notes (Signed)
Inpatient Diabetes Program Recommendations  AACE/ADA: New Consensus Statement on Inpatient Glycemic Control (2013)  Target Ranges:  Prepandial:   less than 140 mg/dL      Peak postprandial:   less than 180 mg/dL (1-2 hours)      Critically ill patients:  140 - 180 mg/dL   Reason for Visit: Hyperglycemia  Results for JAQUIS, PICKLESIMER (MRN [id number]) as of 02/09/2012 15:37  Ref. Range 02/05/2012 15:13  Hemoglobin A1C Latest Range: <5.7 % 6.7 (H)  Results for BERNHARDT, RIEMENSCHNEIDER (MRN [id number]) as of 02/09/2012 15:37  Ref. Range 02/07/2012 23:27 02/08/2012 03:29 02/08/2012 07:30 02/08/2012 12:09 02/08/2012 15:58 02/08/2012 20:39 02/09/2012 06:07 02/09/2012 11:28  Glucose-Capillary Latest Range: 70-99 mg/dL 086 (H) 578 (H) 469 (H) 206 (H) 231 (H) 148 (H) 197 (H) 276 (H)  Results for TALBOT, MONARCH (MRN [id number]) as of 02/09/2012 15:37  Ref. Range 02/09/2012 05:00  Sodium Latest Range: 135-145 mEq/L 134 (L)  Potassium Latest Range: 3.5-5.1 mEq/L 4.2  Chloride Latest Range: 96-112 mEq/L 98  CO2 Latest Range: 19-32 mEq/L 29  BUN Latest Range: 6-23 mg/dL 21  Creatinine Latest Range: 0.50-1.35 mg/dL 2.44 (H)  Calcium Latest Range: 8.4-10.5 mg/dL 8.5  GFR calc non Af Amer Latest Range: >90 mL/min 45 (L)  GFR calc Af Amer Latest Range: >90 mL/min 52 (L)  Glucose Latest Range: 70-99 mg/dL 010 (H)    Inpatient Diabetes Program Recommendations Insulin - Basal: If FBS > 180 mg/dL, increase Lantus to 22 units bid Insulin - Meal Coverage: Increase meal coverage insulin to Novolog 12 units [id number]  Note: Will continue to follow.

## 2012-02-09 NOTE — Progress Notes (Addendum)
                    [address]            [place] [address]          [phone]     3 Days Post-Op Procedure(s) (LRB): CORONARY ARTERY BYPASS GRAFTING (CABG) ([name])  Subjective: Having some incisional discomfort this am, but otherwise feels well.  Appetite good, no nausea.  Breathing stable.   Objective: Vital signs in last 24 hours: Patient Vitals for the past 24 hrs:  BP Temp Temp src Pulse Resp SpO2 Weight  02/09/12 0627 122/70 mmHg 98.8 F (37.1 C) Oral - - - -  02/09/12 0605 167/93 mmHg 99.7 F (37.6 C) Oral 81  20  99 % 220 lb (99.791 kg)  02/08/12 2025 157/72 mmHg 98.7 F (37.1 C) Oral 76  20  96 % -  02/08/12 1400 128/58 mmHg - - 71  20  97 % -  02/08/12 1300 123/62 mmHg - - 74  22  98 % -  02/08/12 1210 - 97.9 F (36.6 C) Oral - - - -  02/08/12 1200 167/77 mmHg - - 78  24  99 % -  02/08/12 1100 130/65 mmHg - - 76  19  97 % -  02/08/12 1000 137/63 mmHg - - 79  20  97 % -  02/08/12 0900 128/56 mmHg - - 87  26  96 % -  02/08/12 0800 125/58 mmHg - - 78  20  95 % -   Current Weight  02/09/12 220 lb (99.791 kg)  PRE-OPERATIVE WEIGHT: 99 kg    Intake/Output from previous day: 11/14 0701 - 11/15 0700 In: 796.7 [P.O.:640; I.V.:106.7; IV Piggyback:50] Out: 790 [Urine:790]  CBGs [phone]  PHYSICAL EXAM:  Heart: RRR Lungs: Clear Wound: Clean and dry Extremities: Mild LE edema, R>L    Lab Results: CBC: Basename 02/09/12 0500 02/08/12 0340  WBC 9.0 8.8  HGB 8.9* 9.3*  HCT 27.5* 28.1*  PLT 233 194   BMET:  Basename 02/09/12 0500 02/08/12 0340  NA 134* 137  K 4.2 4.0  CL 98 103  CO2 29 27  GLUCOSE 197* 137*  BUN 21 17  CREATININE 1.66* 1.28  CALCIUM 8.5 8.0*    PT/INR:  Basename 02/06/12 1330  LABPROT 17.1*  INR 1.43   CXR: Findings: Prior CABG. Cardiomegaly. Bibasilar atelectasis, similar prior study. Small bilateral effusions. No pneumothorax.  IMPRESSION:  Low lung volumes with bibasilar atelectasis. Small effusions.      Assessment/Plan: S/P Procedure(s) (LRB): CORONARY ARTERY BYPASS GRAFTING (CABG) ([name])  CV- Maintaining SR.  BPs trending up. Will continue Norvasc, increase Lopressor (was on Toprol XL 100 mg qd at home).  Vol overload- diurese.  Chronic renal insufficiency- Cr at baseline, although up some today from yesterday.  Monitor.  Expected post op blood loss anemia- generally stable.  DM- CBGs trending up.  Will add meal coverage now that he is eating better and titrate to home dose.  Continue Lantus.  GI- sx's resolved.  CRPI, pulm toilet.  Home Sun/Mon if continues to progress.    LOS: 3 days    COLLINS,GINA H 02/09/2012    I have seen and examined the patient and agree with the assessment and plan as outlined.  Making gradual progress.  Possibly ready for d/c 2-3 days  OWEN,CLARENCE H 02/09/2012 5:17 PM

## 2012-02-09 NOTE — Progress Notes (Signed)
CARDIAC REHAB PHASE I   PRE:  Rate/Rhythm: 79SR  BP:  Supine:   Sitting: 140/70  Standing:    SaO2: 81%RA, 91%2L  MODE:  Ambulation: 350 ft   POST:  Rate/Rhythem: 103ST  BP:  Supine: 180/62,168/60  Sitting:   Standing:    SaO2: 94%2L 0940-1020 Pt fast asleep when I entered room and off of oxygen. Sats at 81%. Put on 2L and sats to 91%.  Pt walked 350 ft on 2L with rolling walker and asst x 1. Some SOB noted. Pt did not stop to rest even with suggesting that he could stop at any time. Requested to go to bed after walk. Assisted to bed. Pt looked uncomfortable. Asked pt if he was in pain and he said yes. Requested pain med from nurse. BP elevated after walk. Took BP again with rest. Left on 2L oxygen.Encouraged IS.  Duanne Limerick

## 2012-02-09 NOTE — Progress Notes (Signed)
Pt ambulated 150 ft with standy assist and 1L of O2. Tolerated well, no complaints of pain or SOB. Will continue to monitor.

## 2012-02-10 LAB — GLUCOSE, CAPILLARY
Glucose-Capillary: 150 mg/dL — ABNORMAL HIGH (ref 70–99)
Glucose-Capillary: 148 mg/dL — ABNORMAL HIGH (ref 70–99)
Glucose-Capillary: 165 mg/dL — ABNORMAL HIGH (ref 70–99)

## 2012-02-10 LAB — BASIC METABOLIC PANEL
Chloride: 99 mEq/L (ref 96–112)
GFR calc Af Amer: 52 mL/min — ABNORMAL LOW (ref >90)
GFR calc non Af Amer: 45 mL/min — ABNORMAL LOW (ref >90)
Potassium: 3.9 mEq/L (ref 3.5–5.1)
Sodium: 136 mEq/L (ref 135–145)

## 2012-02-10 NOTE — Progress Notes (Signed)
Removed Pacing wires per MD order per hospital policy. Pacing wires intact upon removal. Patient tolerated well. VSS. Patient advised to remain in bed for 1 hour.. Frequent VS set up. Will continue to monitor closely. Lajuana Matte, RN

## 2012-02-10 NOTE — Progress Notes (Signed)
CARDIAC REHAB PHASE I   PRE:  Rate/Rhythm: 76 sinus  BP:  Supine:   Sitting: 122/64  Standing:    SaO2: 94 2L  MODE:  Ambulation: 350 ft   POST:  Rate/Rhythem: 94 sinus  BP:  Supine:   Sitting: 124/74  Standing:    SaO2: 92 2L  Pt had previously walk with staff so reviewed d/c education: diet, exercise, sternal precautions, restrictions, when to call 911/MD, and Cardiac Rehab Phase II.  Pt voiced understanding.  After education, pt ambulated 350 ft with assist x1 using rolling walker.  Gait was steady with walker.  Pt does have walker for home use.  Pt returned to bed after walk with call bell in reach.  Pt has orders to wean O2.  Put pt on room air for education, but after pt dropped to 86%.  Returned O2 to 2L and recovered to 92%.  Walked on O2 at 2L.  Pt was able to maintain SaO2 at 92% during walk.  Left O2 on pt in bed at 2L as he sleeps.  May need to consider sending pt home on O2 initially to continue to be able to exercise. Fabio Pierce, MA, ACSM RCEP [phone] Hazle Nordmann

## 2012-02-10 NOTE — Progress Notes (Signed)
[facility]  DAILY PROGRESS NOTE   Subjective:  No events overnight. Nausea is better, but he still has some. Some excess fluid volume.  Objective:  Temp:  [98.4 F (36.9 C)-98.5 F (36.9 C)] 98.4 F (36.9 C) (11/16 0620) Pulse Rate:  [73-75] 73  (11/16 0620) Resp:  [18-20] 20  (11/16 0620) BP: (125-129)/(68-71) 129/70 mmHg (11/16 0620) SpO2:  [93 %-99 %] 93 % (11/16 0620) Weight:  [99.7 kg (219 lb 12.8 oz)] 99.7 kg (219 lb 12.8 oz) (11/16 0620) Weight change: -0.091 kg (-3.2 oz)  Intake/Output from previous day:    Intake/Output from this shift:    Medications: Current Facility-Administered Medications  Medication Dose Route Frequency Provider Last Rate Last Dose  . 0.9 %  sodium chloride infusion  250 mL Intravenous PRN Delight Ovens, MD      . amLODipine (NORVASC) tablet 5 mg  5 mg Oral Daily Rowe Clack, PA   5 mg at 02/09/12 1038  . aspirin EC tablet 325 mg  325 mg Oral Daily Delight Ovens, MD   325 mg at 02/09/12 1038  . bisacodyl (DULCOLAX) EC tablet 10 mg  10 mg Oral Daily PRN Delight Ovens, MD       Or  . bisacodyl (DULCOLAX) suppository 10 mg  10 mg Rectal Daily PRN Delight Ovens, MD      . cholecalciferol (VITAMIN D) tablet 1,000 Units  1,000 Units Oral Daily Wilmon Pali, PA   1,000 Units at 02/09/12 1038  . docusate sodium (COLACE) capsule 200 mg  200 mg Oral Daily Delight Ovens, MD   200 mg at 02/09/12 1038  . enoxaparin (LOVENOX) injection 30 mg  30 mg Subcutaneous Q24H Delight Ovens, MD   30 mg at 02/09/12 1648  . fenofibrate tablet 160 mg  160 mg Oral Daily Wilmon Pali, PA   160 mg at 02/09/12 1038  . ferrous sulfate tablet 325 mg  325 mg Oral BID Delight Ovens, MD   325 mg at 02/09/12 2124  . furosemide (LASIX) tablet 40 mg  40 mg Oral Daily Delight Ovens, MD   40 mg at 02/09/12 1038  . gabapentin (NEURONTIN) capsule 300 mg  300 mg Oral TID Wilmon Pali, PA   300 mg at 02/09/12 2124  .  insulin aspart (novoLOG) injection 0-24 Units  0-24 Units Subcutaneous TID AC & HS Delight Ovens, MD   2 Units at 02/10/12 0636  . insulin aspart (novoLOG) injection 8 Units  8 Units Subcutaneous TID WC Wilmon Pali, PA   8 Units at 02/10/12 0752  . insulin glargine (LANTUS) injection 20 Units  20 Units Subcutaneous BID Delight Ovens, MD   20 Units at 02/09/12 2128  . magnesium hydroxide (MILK OF MAGNESIA) suspension 30 mL  30 mL Oral Daily PRN Delight Ovens, MD      . metoprolol tartrate (LOPRESSOR) tablet 25 mg  25 mg Oral BID Wilmon Pali, PA   25 mg at 02/09/12 2123  . mupirocin ointment (BACTROBAN) 2 % 1 application  1 application Nasal BID Delight Ovens, MD   1 application at 02/09/12 2127  . ondansetron (ZOFRAN) tablet 4 mg  4 mg Oral Q6H PRN Delight Ovens, MD       Or  . ondansetron [facility]) injection 4 mg  4 mg Intravenous Q6H PRN Delight Ovens, MD   4 mg at 02/10/12  1610  . oxyCODONE (Oxy IR/ROXICODONE) immediate release tablet 5-10 mg  5-10 mg Oral Q3H PRN Delight Ovens, MD   10 mg at 02/09/12 1038  . pantoprazole (PROTONIX) EC tablet 40 mg  40 mg Oral QAC breakfast Delight Ovens, MD   40 mg at 02/10/12 0636  . sertraline (ZOLOFT) tablet 150 mg  150 mg Oral Daily Wilmon Pali, PA   150 mg at 02/09/12 1038  . simvastatin (ZOCOR) tablet 20 mg  20 mg Oral q1800 Wilmon Pali, PA   20 mg at 02/09/12 1648  . sodium chloride 0.9 % injection 3 mL  3 mL Intravenous Q12H Delight Ovens, MD   3 mL at 02/09/12 2130  . sodium chloride 0.9 % injection 3 mL  3 mL Intravenous PRN Delight Ovens, MD      . Tamsulosin HCl [facility]) capsule 0.4 mg  0.4 mg Oral QHS Rowe Clack, PA   0.4 mg at 02/09/12 2124  . traMADol (ULTRAM) tablet 50 mg  50 mg Oral Q12H Delight Ovens, MD   50 mg at 02/09/12 2123    Physical Exam: General appearance: alert and no distress Neck: no adenopathy, no carotid bruit, no JVD, supple, symmetrical, trachea midline and thyroid not  enlarged, symmetric, no tenderness/mass/nodules Lungs: clear to auscultation bilaterally Heart: regular rate and rhythm, S1, S2 normal, no murmur, click, rub or gallop Abdomen: soft, non-tender; bowel sounds normal; no masses,  no organomegaly Extremities: extremities normal, atraumatic, no cyanosis or edema Pulses: 2+ and symmetric  Lab Results: Results for orders placed during the hospital encounter of 02/06/12 (from the past 48 hour(s))  GLUCOSE, CAPILLARY     Status: Abnormal   Collection Time   02/08/12 12:09 PM      Component Value Range Comment   Glucose-Capillary 206 (*) 70 - 99 mg/dL    Comment 1 Documented in Chart      Comment 2 Notify RN     GLUCOSE, CAPILLARY     Status: Abnormal   Collection Time   02/08/12  3:58 PM      Component Value Range Comment   Glucose-Capillary 231 (*) 70 - 99 mg/dL   GLUCOSE, CAPILLARY     Status: Abnormal   Collection Time   02/08/12  8:39 PM      Component Value Range Comment   Glucose-Capillary 148 (*) 70 - 99 mg/dL    Comment 1 Documented in Chart      Comment 2 Notify RN     CBC     Status: Abnormal   Collection Time   02/09/12  5:00 AM      Component Value Range Comment   WBC 9.0  4.0 - 10.5 K/uL    RBC 3.17 (*) 4.22 - 5.81 MIL/uL    Hemoglobin 8.9 (*) 13.0 - 17.0 g/dL    HCT 96.0 (*) 45.4 - 52.0 %    MCV 86.8  78.0 - 100.0 fL    MCH 28.1  26.0 - 34.0 pg    MCHC 32.4  30.0 - 36.0 g/dL    RDW 09.8  11.9 - 14.7 %    Platelets 233  150 - 400 K/uL   BASIC METABOLIC PANEL     Status: Abnormal   Collection Time   02/09/12  5:00 AM      Component Value Range Comment   Sodium 134 (*) 135 - 145 mEq/L    Potassium 4.2  3.5 - 5.1 mEq/L  Chloride 98  96 - 112 mEq/L    CO2 29  19 - 32 mEq/L    Glucose, Bld 197 (*) 70 - 99 mg/dL    BUN 21  6 - 23 mg/dL    Creatinine, Ser 1.61 (*) 0.50 - 1.35 mg/dL    Calcium 8.5  8.4 - 09.6 mg/dL    GFR calc non Af Amer 45 (*) >90 mL/min    GFR calc Af Amer 52 (*) >90 mL/min   GLUCOSE,  CAPILLARY     Status: Abnormal   Collection Time   02/09/12  6:07 AM      Component Value Range Comment   Glucose-Capillary 197 (*) 70 - 99 mg/dL   GLUCOSE, CAPILLARY     Status: Abnormal   Collection Time   02/09/12 11:28 AM      Component Value Range Comment   Glucose-Capillary 276 (*) 70 - 99 mg/dL    Comment 1 Notify RN     GLUCOSE, CAPILLARY     Status: Abnormal   Collection Time   02/09/12  3:54 PM      Component Value Range Comment   Glucose-Capillary 172 (*) 70 - 99 mg/dL    Comment 1 Notify RN      Comment 2 Documented in Chart     GLUCOSE, CAPILLARY     Status: Abnormal   Collection Time   02/09/12  9:14 PM      Component Value Range Comment   Glucose-Capillary 158 (*) 70 - 99 mg/dL   BASIC METABOLIC PANEL     Status: Abnormal   Collection Time   02/10/12  4:55 AM      Component Value Range Comment   Sodium 136  135 - 145 mEq/L    Potassium 3.9  3.5 - 5.1 mEq/L    Chloride 99  96 - 112 mEq/L    CO2 28  19 - 32 mEq/L    Glucose, Bld 166 (*) 70 - 99 mg/dL    BUN 26 (*) 6 - 23 mg/dL    Creatinine, Ser 0.45 (*) 0.50 - 1.35 mg/dL    Calcium 8.6  8.4 - 40.9 mg/dL    GFR calc non Af Amer 45 (*) >90 mL/min    GFR calc Af Amer 52 (*) >90 mL/min   GLUCOSE, CAPILLARY     Status: Abnormal   Collection Time   02/10/12  6:24 AM      Component Value Range Comment   Glucose-Capillary 150 (*) 70 - 99 mg/dL     Imaging: Dg Chest 2 View  02/09/2012  *RADIOLOGY REPORT*  Clinical Data: Shortness of breath, chest pain.  CHEST - 2 VIEW  Comparison: 02/08/2012  Findings: Prior CABG.  Cardiomegaly.  Bibasilar atelectasis, similar prior study.  Small bilateral effusions.  No pneumothorax.  IMPRESSION: Low lung volumes with bibasilar atelectasis.  Small effusions.   Original Report Authenticated By: Charlett Nose, M.D.     Assessment:  1. Active Problems: 2.  DIABETES MELLITUS, TYPE I 3.  HYPERTENSION 4.  GERD 5.  Dyslipidemia 6.  Chronic kidney disease 7.  Coronary  arteriography abnormal 8.   Plan:  1. Doing better, still nauseated. Will need additional diuresis. Maintaining sinus rhythm. Creatinine seems to have stabilized around his baseline of 1.6.   Time Spent Directly with Patient:  15 minutes  Length of Stay:  LOS: 4 days   Chrystie Nose, MD, [facility] Attending Cardiologist [facility]  Chesky Heyer  C 02/10/2012, 9:11 AM

## 2012-02-10 NOTE — Progress Notes (Signed)
Pt O2 sats reading in the upper 70's and low 80's while pt was sleeping. Pt nasal cannula was not in patients nose at the time. When nasal cannula was placed in nostrils, and pt took deep breaths he came right up to 94% on 2L. Pt asymptomatic through this, will continue to monitor.

## 2012-02-10 NOTE — Progress Notes (Signed)
4 Days Post-Op Procedure(s) (LRB): CORONARY ARTERY BYPASS GRAFTING (CABG) ([name]) Subjective:  Joseph [name] has no complaints this morning.  He is ambulating without difficulty. +BM  Objective: Vital signs in last 24 hours: Temp:  [98.4 F (36.9 C)-98.5 F (36.9 C)] 98.4 F (36.9 C) (11/16 0620) Pulse Rate:  [73-75] 73  (11/16 0620) Cardiac Rhythm:  [-] Normal sinus rhythm (11/15 1940) Resp:  [18-20] 20  (11/16 0620) BP: (125-129)/(68-71) 129/70 mmHg (11/16 0620) SpO2:  [93 %-99 %] 93 % (11/16 0620) Weight:  [219 lb 12.8 oz (99.7 kg)] 219 lb 12.8 oz (99.7 kg) (11/16 0620)  General appearance: alert, cooperative and no distress Heart: regular rate and rhythm Lungs: diminished breath sounds bibasilar Abdomen: soft, non-tender; bowel sounds normal; no masses,  no organomegaly Extremities: edema 1+ Wound: clean and dry  Lab Results:  Basename 02/09/12 0500 02/08/12 0340  WBC 9.0 8.8  HGB 8.9* 9.3*  HCT 27.5* 28.1*  PLT 233 194   BMET:  Basename 02/10/12 0455 02/09/12 0500  NA 136 134*  K 3.9 4.2  CL 99 98  CO2 28 29  GLUCOSE 166* 197*  BUN 26* 21  CREATININE 1.64* 1.66*  CALCIUM 8.6 8.5    PT/INR: No results found for this basename: LABPROT,INR in the last 72 hours ABG    Component Value Date/Time   PHART 7.363 02/06/2012 1855   HCO3 23.3 02/06/2012 1855   TCO2 24 02/07/2012 1716   ACIDBASEDEF 2.0 02/06/2012 1855   O2SAT 96.0 02/06/2012 1855   CBG (last 3)   Basename 02/10/12 0624 02/09/12 2114 02/09/12 1554  GLUCAP 150* 158* 172*    Assessment/Plan: S/P Procedure(s) (LRB): CORONARY ARTERY BYPASS GRAFTING (CABG) ([name])  1. CV-NSR rate and pressure controlled on Lopressor and Norvasc 2. Pulm- continue to wean oxygen, continue IS 3. Volume Overload- continue Lasix 4. Renal- CKD, creatinine at baseline 5.  DM- CBGs under better control continue insulin 6. Dispo- patient doing well, will wean oxygen as tolerated, D/C EPW today and if off oxygen possibly d/c  home in the AM   LOS: 4 days    Arletta Lumadue 02/10/2012

## 2012-02-11 DIAGNOSIS — Z951 Presence of aortocoronary bypass graft: Secondary | ICD-10-CM

## 2012-02-11 LAB — GLUCOSE, CAPILLARY
Glucose-Capillary: 112 mg/dL — ABNORMAL HIGH (ref 70–99)
Glucose-Capillary: 113 mg/dL — ABNORMAL HIGH (ref 70–99)
Glucose-Capillary: 195 mg/dL — ABNORMAL HIGH (ref 70–99)
Glucose-Capillary: 132 mg/dL — ABNORMAL HIGH (ref 70–99)
Glucose-Capillary: 127 mg/dL — ABNORMAL HIGH (ref 70–99)

## 2012-02-11 MED ORDER — AMLODIPINE BESYLATE 10 MG PO TABS
10.0000 mg | ORAL_TABLET | Freq: Every day | ORAL | Status: DC
Start: 2012-02-11 — End: 2012-02-12
  Administered 2012-02-11 – 2012-02-12 (×2): 10 mg via ORAL
  Filled 2012-02-11 (×2): qty 1

## 2012-02-11 MED ORDER — AMLODIPINE BESYLATE 5 MG PO TABS: 10.0000 mg | ORAL_TABLET | Freq: Every day | ORAL | Status: DC

## 2012-02-11 MED ORDER — FUROSEMIDE 10 MG/ML IJ SOLN
INTRAMUSCULAR | Status: AC
  Administered 2012-02-11: 11:00:00
  Filled 2012-02-11: qty 4

## 2012-02-11 MED ORDER — FUROSEMIDE 10 MG/ML IJ SOLN
40.0000 mg | Freq: Once | INTRAMUSCULAR | Status: AC
  Administered 2012-02-11: 40 mg via INTRAVENOUS

## 2012-02-11 MED ORDER — AMLODIPINE BESYLATE 10 MG PO TABS: 10.0000 mg | ORAL_TABLET | Freq: Every day | ORAL | Status: DC

## 2012-02-11 NOTE — Discharge Summary (Addendum)
Physician Discharge Summary  [name] ID: Joseph [name] MRN: [id number] DOB/AGE: May 01, 1955 56 y.o.  Admit date: 02/06/2012 Discharge date: 02/11/2012  Admission Diagnoses:  [name] Active Problem List  Diagnosis  . DIABETES MELLITUS, TYPE I  . HYPERTENSION  . GERD  . ARTHRITIS  . NEPHROLITHIASIS, HX OF  . Dyslipidemia  . Chronic kidney disease  . Unstable angina  . Coronary arteriography abnormal   Discharge Diagnoses:   [name] Active Problem List  Diagnosis  . DIABETES MELLITUS, TYPE I  . HYPERTENSION  . GERD  . ARTHRITIS  . NEPHROLITHIASIS, HX OF  . Dyslipidemia  . Chronic kidney disease  . Unstable angina  . Coronary arteriography abnormal  . S/P CABG x 5   Discharged Condition: good  History of Present Illness:   Joseph [name] is a 56 yo male with a long standing history of diabetes.  He presented to Dr. Blanchie Dessert office in July with a complaint of chest discomfort.  He underwent stress test which was negative for ischemia.  He also underwent Metabolic testing which showed a peak VO2 which was markedly decreased indicating high suspicious for ischemia.  Joseph [name] remained chest pain free since that time.  He eventually underwent cardiac catheterization performed on 01/30/2012 which revealed a preserved a LV function and multivessel Coronary disease.  Due to his history of diabetes it was felt Joseph [name] would best be served with Coronary Bypass Procedure.  Therefore, Dr. Tyrone Sage was consulted per family request for possible surgical intervention.  Dr. Tyrone Sage evaluated Joseph [name] and felt Joseph would benefit from coronary bypass procedure.  Joseph risks and benefits were explained to Joseph [name] and he was agreeable to proceed with surgery scheduled for 02/06/2012.       Hospital Course:   Joseph [name] presented to [facility] on 02/06/2012.  He was taken to Joseph operating room and underwent CABG x 5 utilizing LIMA to LAD, SVG to OM1, SVG to PD and Left  Circumflex.  He also underwent endoscopic saphenous vein harvest of Joseph right leg.  He tolerated Joseph procedure well and was taken to Joseph SICU in stable condition.  Joseph [name] has been progressing since surgery.  He was extubated without difficulty.  Chest tubes have been removed and chest xray shows evidence of bilateral atelectasis for which [name] was given a flutter valve.  Joseph [name] is maintaining NSR and his pacing wires were removed.  Joseph [name] continues to have pulmonary issues.  He continues to desaturate without use of oxygen.  We will continue to attempt and wean oxygen today.  However, home oxygen arrangements have been made for discharge if necessary.  We will plan to discharge Joseph [name] home in Joseph next 24 hours.  He will follow up with Dr. Laneta Simmers on 03/01/2012 with a chest xray prior to his appointment with Dr. Laneta Simmers.  He will also need to follow up with Dr. Rennis Golden.   Significant Diagnostic Studies: cardiac catheterization  Hemodynamics:  Central Aortic Pressure / Mean Aortic Pressure: 116/68  LV Pressure / LV End diastolic Pressure: 12  Coronary Angiographic Data:  Left Main: Normal course, bifurcates normally to Joseph LAD and LCx arteries. No significant stenosis.  Left Anterior Descending (LAD): Joseph proximal LAD is calcified with a tubular 80% stenosis up to Joseph D1 branch, where Joseph LAD tapers to 95%. There is 80-90% ostial D1 stenosis at Joseph bifurcation of a large D1 vessel.  1st diagonal (D1): Large vessel with ostial 85-90% stenosis, calcified at Joseph  bifurcation.  Circumflex (LCx): Dominant vessel. There are 2 small proximal OM vessels which are insignificant and moderate disease of Joseph distal LCx.  3rd obtuse marginal: Large branch with a proximal to mid-vessel 80% discrete stenosis.  4th obtuse marginal: Another large branch with a 95% ostial stenosis and a sequential discrete 80% stenosis another 10-15 mm downstream. posterior lateral branch: No significant stenosis.    Right Coronary Artery: Non-dominant vessel, ostial reflux is present during injection. Smaller vessel.  posterior descending artery: No significant stenosis.  posterior lateral branch: Bifurcates and there is moderate to severe proximal disease of both vessels.   Treatments: surgery:   Coronary artery bypass grafting x5 with Joseph left  internal mammary to Joseph left anterior descending coronary artery,  reverse saphenous vein graft to Joseph diagonal coronary artery, reverse  saphenous vein graft to Joseph distal first obtuse marginal, sequential  reverse saphenous vein graft to Joseph posterior descending coronary artery  and Joseph second obtuse marginal coronary artery with right leg endo vein  Harvesting.  Disposition: 01-Home or Self Care  Joseph [name] has been discharged on:   1.Beta Blocker:  Yes [  x ]                              No   [   ]                              If No, reason:  2.Ace Inhibitor/ARB: Yes [   ]                                     No  [  x  ]                                     If No, reason:Preserved EF, labile blood pressure  3.Statin:   Yes [  x ]                  No  [   ]                  If No, reason:  4.Ecasa:  Yes  [  x ]                  No   [   ]                  If No, reason:     Discharge Orders    Future Appointments: Provider: Department: Dept Phone: Center:   02/26/2012 1:00 PM Sherrie George, MD [facility] [phone] None   03/07/2012 4:00 PM Delight Ovens, MD [facility] [place] [phone]   04/05/2012 9:15 AM Sherrie George, MD [facility] [phone] None     Future Orders Please Complete By Expires   Amb Referral to Cardiac Rehabilitation          Medication List     Medication List     As of 02/12/2012  1:28 PM    STOP taking these medications         hydrALAZINE 25 MG tablet   Commonly known  as: APRESOLINE       metoprolol succinate 100 MG 24 hr tablet   Commonly known as: TOPROL-XL      TAKE these medications         amLODipine 10 MG tablet   Commonly known as: NORVASC   Take 1 tablet (10 mg total) by mouth daily.      aspirin 325 MG EC tablet   Take 1 tablet (325 mg total) by mouth daily.      cholecalciferol 1000 UNITS tablet   Commonly known as: VITAMIN D   Take 1,000 Units by mouth daily.      fenofibrate 160 MG tablet   Take 160 mg by mouth daily.      ferrous sulfate 325 (65 FE) MG tablet   Take 325 mg by mouth 2 (two) times daily.      gabapentin 300 MG capsule   Commonly known as: NEURONTIN   Take 300 mg by mouth 3 (three) times daily.      insulin aspart 100 UNIT/ML injection   Commonly known as: novoLOG   Inject 10-15 Units into Joseph skin 3 (three) times daily before meals. Per sliding scale      insulin glargine 100 UNIT/ML injection   Commonly known as: LANTUS   Inject 20 Units into Joseph skin 2 (two) times daily.      metoprolol tartrate 25 MG tablet   Commonly known as: LOPRESSOR   Take 1 tablet (25 mg total) by mouth 2 (two) times daily.      omeprazole 20 MG capsule   Commonly known as: PRILOSEC   Take 20 mg by mouth daily.      ondansetron 4 MG tablet   Commonly known as: ZOFRAN   Take 1 tablet (4 mg total) by mouth every 6 (six) hours as needed for nausea.      pravastatin 40 MG tablet   Commonly known as: PRAVACHOL   Take 40 mg by mouth daily.      sertraline 100 MG tablet   Commonly known as: ZOLOFT   Take 150 mg by mouth daily.      Tamsulosin HCl 0.4 MG Caps   Commonly known as: FLOMAX   Take 0.4 mg by mouth daily after supper.      traMADol 50 MG tablet   Commonly known as: ULTRAM   Take 1 tablet (50 mg total) by mouth every 12 (twelve) hours.             Follow-up Information    Follow up with GERHARDT,EDWARD B, MD. ( 03/07/2012 at 4 PM. Please obtain a chest x-ray at [facility] imaging at 3 pm. [facility] imaging is located in [place]  same office complex.)    Contact information:   [address] [place] [address] [phone]       Follow up with Chrystie Nose, MD. (2 weeks-please contact [place] office to arrange this appointment)    Contact information:   [address] [place] [address] [phone]          Signed: Lowella Dandy 02/11/2012, 11:57 AM

## 2012-02-11 NOTE — Progress Notes (Signed)
[facility]  DAILY PROGRESS NOTE   Subjective:  Nausea is improved. Still with O2 requirement.  Objective:  Temp:  [98 F (36.7 C)-98.8 F (37.1 C)] 98.2 F (36.8 C) (11/17 0435) Pulse Rate:  [65-80] 65  (11/17 0435) Resp:  [16-20] 18  (11/17 0435) BP: (128-158)/(64-88) 130/72 mmHg (11/17 0435) SpO2:  [93 %-95 %] 94 % (11/17 0435) Weight:  [101.197 kg (223 lb 1.6 oz)] 101.197 kg (223 lb 1.6 oz) (11/17 0435) Weight change: 1.497 kg (3 lb 4.8 oz)  Intake/Output from previous day: 11/16 0701 - 11/17 0700 In: 240 [P.O.:240] Out: -   Intake/Output from this shift:    Medications: Current Facility-Administered Medications  Medication Dose Route Frequency Provider Last Rate Last Dose  . 0.9 %  sodium chloride infusion  250 mL Intravenous PRN Delight Ovens, MD      . amLODipine (NORVASC) tablet 10 mg  10 mg Oral Daily Erin Barrett, PA   10 mg at 02/11/12 1039  . aspirin EC tablet 325 mg  325 mg Oral Daily Delight Ovens, MD   325 mg at 02/11/12 1040  . bisacodyl (DULCOLAX) EC tablet 10 mg  10 mg Oral Daily PRN Delight Ovens, MD       Or  . bisacodyl (DULCOLAX) suppository 10 mg  10 mg Rectal Daily PRN Delight Ovens, MD      . cholecalciferol (VITAMIN D) tablet 1,000 Units  1,000 Units Oral Daily Wilmon Pali, PA   1,000 Units at 02/11/12 1039  . docusate sodium (COLACE) capsule 200 mg  200 mg Oral Daily Delight Ovens, MD   200 mg at 02/11/12 1041  . enoxaparin (LOVENOX) injection 30 mg  30 mg Subcutaneous Q24H Delight Ovens, MD   30 mg at 02/10/12 1642  . fenofibrate tablet 160 mg  160 mg Oral Daily Wilmon Pali, PA   160 mg at 02/11/12 1041  . ferrous sulfate tablet 325 mg  325 mg Oral BID Delight Ovens, MD   325 mg at 02/11/12 1040  . furosemide (LASIX) 10 MG/ML injection           . [COMPLETED] furosemide (LASIX) injection 40 mg  40 mg Intravenous Once Erin Barrett, PA   40 mg at 02/11/12 1038  . [COMPLETED] furosemide  (LASIX) tablet 40 mg  40 mg Oral Daily Delight Ovens, MD   40 mg at 02/10/12 1108  . gabapentin (NEURONTIN) capsule 300 mg  300 mg Oral TID Wilmon Pali, PA   300 mg at 02/11/12 1039  . insulin aspart (novoLOG) injection 0-24 Units  0-24 Units Subcutaneous TID AC & HS Delight Ovens, MD   4 Units at 02/10/12 1641  . insulin aspart (novoLOG) injection 8 Units  8 Units Subcutaneous TID WC Wilmon Pali, PA   8 Units at 02/11/12 0700  . insulin glargine (LANTUS) injection 20 Units  20 Units Subcutaneous BID Delight Ovens, MD   20 Units at 02/11/12 1041  . magnesium hydroxide (MILK OF MAGNESIA) suspension 30 mL  30 mL Oral Daily PRN Delight Ovens, MD      . metoprolol tartrate (LOPRESSOR) tablet 25 mg  25 mg Oral BID Wilmon Pali, PA   25 mg at 02/11/12 1040  . [COMPLETED] mupirocin ointment (BACTROBAN) 2 % 1 application  1 application Nasal BID Delight Ovens, MD   1 application at 02/11/12 1041  . ondansetron (ZOFRAN)  tablet 4 mg  4 mg Oral Q6H PRN Delight Ovens, MD       Or  . ondansetron [facility]) injection 4 mg  4 mg Intravenous Q6H PRN Delight Ovens, MD   4 mg at 02/10/12 2206  . oxyCODONE (Oxy IR/ROXICODONE) immediate release tablet 5-10 mg  5-10 mg Oral Q3H PRN Delight Ovens, MD   10 mg at 02/09/12 1038  . pantoprazole (PROTONIX) EC tablet 40 mg  40 mg Oral QAC breakfast Delight Ovens, MD   40 mg at 02/11/12 [phone]  . sertraline (ZOLOFT) tablet 150 mg  150 mg Oral Daily Wilmon Pali, PA   150 mg at 02/11/12 1039  . simvastatin (ZOCOR) tablet 20 mg  20 mg Oral q1800 Wilmon Pali, PA   20 mg at 02/10/12 1746  . sodium chloride 0.9 % injection 3 mL  3 mL Intravenous Q12H Delight Ovens, MD   3 mL at 02/10/12 2109  . sodium chloride 0.9 % injection 3 mL  3 mL Intravenous PRN Delight Ovens, MD   3 mL at 02/10/12 2206  . Tamsulosin HCl (FLOMAX) capsule 0.4 mg  0.4 mg Oral QHS Rowe Clack, PA   0.4 mg at 02/10/12 2108  . traMADol (ULTRAM) tablet 50 mg   50 mg Oral Q12H Delight Ovens, MD   50 mg at 02/11/12 1041  . [DISCONTINUED] amLODipine (NORVASC) tablet 5 mg  5 mg Oral Daily Rowe Clack, PA   5 mg at 02/10/12 1107    Physical Exam: General appearance: alert and no distress Neck: no adenopathy, no carotid bruit, no JVD, supple, symmetrical, trachea midline and thyroid not enlarged, symmetric, no tenderness/mass/nodules Lungs: clear to auscultation bilaterally Heart: regular rate and rhythm, S1, S2 normal, no murmur, click, rub or gallop Abdomen: soft, non-tender; bowel sounds normal; no masses,  no organomegaly Extremities: extremities normal, atraumatic, no cyanosis or edema  Lab Results: Results for orders placed during the hospital encounter of 02/06/12 (from the past 48 hour(s))  GLUCOSE, CAPILLARY     Status: Abnormal   Collection Time   02/09/12 11:28 AM      Component Value Range Comment   Glucose-Capillary 276 (*) 70 - 99 mg/dL    Comment 1 Notify RN     GLUCOSE, CAPILLARY     Status: Abnormal   Collection Time   02/09/12  3:54 PM      Component Value Range Comment   Glucose-Capillary 172 (*) 70 - 99 mg/dL    Comment 1 Notify RN      Comment 2 Documented in Chart     GLUCOSE, CAPILLARY     Status: Abnormal   Collection Time   02/09/12  9:14 PM      Component Value Range Comment   Glucose-Capillary 158 (*) 70 - 99 mg/dL   BASIC METABOLIC PANEL     Status: Abnormal   Collection Time   02/10/12  4:55 AM      Component Value Range Comment   Sodium 136  135 - 145 mEq/L    Potassium 3.9  3.5 - 5.1 mEq/L    Chloride 99  96 - 112 mEq/L    CO2 28  19 - 32 mEq/L    Glucose, Bld 166 (*) 70 - 99 mg/dL    BUN 26 (*) 6 - 23 mg/dL    Creatinine, Ser 5.64 (*) 0.50 - 1.35 mg/dL    Calcium 8.6  8.4 - 33.2  mg/dL    GFR calc non Af Amer 45 (*) >90 mL/min    GFR calc Af Amer 52 (*) >90 mL/min   GLUCOSE, CAPILLARY     Status: Abnormal   Collection Time   02/10/12  6:24 AM      Component Value Range Comment    Glucose-Capillary 150 (*) 70 - 99 mg/dL   GLUCOSE, CAPILLARY     Status: Abnormal   Collection Time   02/10/12 11:20 AM      Component Value Range Comment   Glucose-Capillary 148 (*) 70 - 99 mg/dL   GLUCOSE, CAPILLARY     Status: Abnormal   Collection Time   02/10/12  4:35 PM      Component Value Range Comment   Glucose-Capillary 165 (*) 70 - 99 mg/dL   GLUCOSE, CAPILLARY     Status: Abnormal   Collection Time   02/10/12  8:50 PM      Component Value Range Comment   Glucose-Capillary 112 (*) 70 - 99 mg/dL   GLUCOSE, CAPILLARY     Status: Abnormal   Collection Time   02/11/12  6:04 AM      Component Value Range Comment   Glucose-Capillary 113 (*) 70 - 99 mg/dL     Imaging: No results found.  Assessment:  1. Active Problems: 2.  DIABETES MELLITUS, TYPE I 3.  HYPERTENSION 4.  GERD 5.  Dyslipidemia 6.  Chronic kidney disease 7.  Coronary arteriography abnormal 8.   Plan:  1. Improving steadily .Marland Kitchen Agree with increase in norvasc. Close to discharge. Xray shows small effusions and atelectasis.  Agree with IS use. ?single dose lasix.  Time Spent Directly with Patient:  15 minutes  Length of Stay:  LOS: 5 days   Joseph Nose, MD, [facility] Attending Cardiologist [facility]  Anjanette Gilkey C 02/11/2012, 10:41 AM

## 2012-02-11 NOTE — Progress Notes (Addendum)
5 Days Post-Op Procedure(s) (LRB): CORONARY ARTERY BYPASS GRAFTING (CABG) ([name]) Subjective:  Joseph [name] has no complaints this morning.  He continues to have difficulty weaning off his oxygen.  +BM  Objective: Vital signs in last 24 hours: Temp:  [98 F (36.7 C)-98.8 F (37.1 C)] 98.2 F (36.8 C) (11/17 0435) Pulse Rate:  [65-80] 65  (11/17 0435) Cardiac Rhythm:  [-] Normal sinus rhythm (11/16 1940) Resp:  [16-20] 18  (11/17 0435) BP: (128-158)/(64-88) 130/72 mmHg (11/17 0435) SpO2:  [93 %-95 %] 94 % (11/17 0435) Weight:  [223 lb 1.6 oz (101.197 kg)] 223 lb 1.6 oz (101.197 kg) (11/17 0435)  Intake/Output from previous day: 11/16 0701 - 11/17 0700 In: 240 [P.O.:240] Out: -   General appearance: alert, cooperative and no distress Heart: regular rate and rhythm Lungs: clear to auscultation bilaterally Abdomen: soft, non-tender; bowel sounds normal; no masses,  no organomegaly Extremities: edema 1+ Wound: clean and dry  Lab Results:  Basename 02/09/12 0500  WBC 9.0  HGB 8.9*  HCT 27.5*  PLT 233   BMET:  Basename 02/10/12 0455 02/09/12 0500  NA 136 134*  K 3.9 4.2  CL 99 98  CO2 28 29  GLUCOSE 166* 197*  BUN 26* 21  CREATININE 1.64* 1.66*  CALCIUM 8.6 8.5    PT/INR: No results found for this basename: LABPROT,INR in the last 72 hours ABG    Component Value Date/Time   PHART 7.363 02/06/2012 1855   HCO3 23.3 02/06/2012 1855   TCO2 24 02/07/2012 1716   ACIDBASEDEF 2.0 02/06/2012 1855   O2SAT 96.0 02/06/2012 1855   CBG (last 3)   Basename 02/11/12 0604 02/10/12 2050 02/10/12 1635  GLUCAP 113* 112* 165*    Assessment/Plan: S/P Procedure(s) (LRB): CORONARY ARTERY BYPASS GRAFTING (CABG) ([name])  1. CV- NSR rate controlled, pressure somewhat elevated will increase Norvasc to 10mg  Daily 2. Pulm- still on oxygen, will continue attempts to wean, instructed on importance of IS use 3. Volume Overload- weight slowly rising, no on Lasix, will give IV dose today    4. Renal- CKD creatinine at baseline 5. DM- CBGs controlled 6. Dispo- continue to attempt and wean oxygen today, patient maintaining NSR, if remains on oxygen tomorrow will arrange home use and d/c home in the AM   LOS: 5 days    [name], Joseph 02/11/2012   patient examined and medical record reviewed,agree with above note.Home in AM hopefully off O2 Joseph [name],Joseph [name] 02/11/2012

## 2012-02-12 LAB — GLUCOSE, CAPILLARY
Glucose-Capillary: 71 mg/dL (ref 70–99)
Glucose-Capillary: 221 mg/dL — ABNORMAL HIGH (ref 70–99)

## 2012-02-12 MED ORDER — ONDANSETRON HCL 4 MG PO TABS: 4.0000 mg | ORAL_TABLET | Freq: Four times a day (QID) | ORAL | Status: DC | PRN

## 2012-02-12 MED ORDER — TRAMADOL HCL 50 MG PO TABS: 50.0000 mg | ORAL_TABLET | Freq: Two times a day (BID) | ORAL | Status: DC

## 2012-02-12 NOTE — Progress Notes (Signed)
Pt's chest tube sutures discontinued and two steri strips were applied to each site totaling in 4 steri strips. Pt tolerated well.

## 2012-02-12 NOTE — Care Management Note (Signed)
    Page 1 of 2   02/12/2012     4:27:30 PM   CARE MANAGEMENT NOTE 02/12/2012  Patient:  Joseph [name], Joseph [name]   Account Number:  [id number]  Date Initiated:  02/06/2012  Documentation initiated by:  Alvira Philips Assessment:   56 yr-old male adm s/p CABG     Action/Plan:   PTA, PT RESIDES WITH DAUGHTER.  WILL NEED HOME HEALTH, DME UPON DC.  WILL DC HOME WITH [place], [phone].   Anticipated DC Date:  02/12/2012   Anticipated DC Plan:  HOME W HOME HEALTH SERVICES      DC Planning Services  CM consult      [facility] Choice  HOME HEALTH   Choice offered to / List presented to:  C-1 Patient   DME arranged  [facility]      DME agency  [facility]     HH arranged  HH-1 RN  HH-2 PT      [facility] agency  [facility]   Status of service:  Completed, signed off Medicare Important Message given?   (If response is "NO", the following Medicare IM given date fields will be blank) Date Medicare IM given:   Date Additional Medicare IM given:    Discharge Disposition:  HOME W HOME HEALTH SERVICES  Per UR Regulation:  Reviewed for med. necessity/level of care/duration of stay  If discussed at Long Length of Stay Meetings, dates discussed:    Comments:  PCP:  Dr. Herb Grays  02/12/12 [name],RN,BSN [phone] SPOKE WITH DAUGHTER Joseph [name], AT PT'S REQUEST, REGARDING HOME HEALTH CARE SET UP.  REFERRAL TO [facility], PER DAUGHTER'S CHOICE.  PT TO DC HOME WITH DAUGHTER, ADDRESS [address] [place] [address].  DME REQUESTED FROM [facility] TO BE DELIVERED TO PT ROOM PRIOR TO DC.  START OF CARE 24-48H POST DC DATE.

## 2012-02-12 NOTE — Progress Notes (Signed)
[facility]  Subjective: Doing well.  Up eating breakfast.  Objective: Vital signs in last 24 hours: Temp:  [98.3 F (36.8 C)-99.1 F (37.3 C)] 98.3 F (36.8 C) (11/18 0410) Pulse Rate:  [63-77] 63  (11/18 0410) Resp:  [18-19] 18  (11/18 0410) BP: (121-150)/(68-83) 122/77 mmHg (11/18 0410) SpO2:  [91 %-95 %] 92 % (11/18 0410) Weight:  [101 kg (222 lb 10.6 oz)] 101 kg (222 lb 10.6 oz) (11/18 0410) Last BM Date: 02/11/12  Intake/Output from previous day: 11/17 0701 - 11/18 0700 In: 480 [P.O.:480] Out: -  Intake/Output this shift:    Medications Current Facility-Administered Medications  Medication Dose Route Frequency Provider Last Rate Last Dose  . 0.9 %  sodium chloride infusion  250 mL Intravenous PRN Delight Ovens, MD      . amLODipine (NORVASC) tablet 10 mg  10 mg Oral Daily Erin Barrett, PA   10 mg at 02/11/12 1039  . aspirin EC tablet 325 mg  325 mg Oral Daily Delight Ovens, MD   325 mg at 02/11/12 1040  . bisacodyl (DULCOLAX) EC tablet 10 mg  10 mg Oral Daily PRN Delight Ovens, MD       Or  . bisacodyl (DULCOLAX) suppository 10 mg  10 mg Rectal Daily PRN Delight Ovens, MD      . cholecalciferol (VITAMIN D) tablet 1,000 Units  1,000 Units Oral Daily Wilmon Pali, PA   1,000 Units at 02/11/12 1039  . docusate sodium (COLACE) capsule 200 mg  200 mg Oral Daily Delight Ovens, MD   200 mg at 02/11/12 1041  . enoxaparin (LOVENOX) injection 30 mg  30 mg Subcutaneous Q24H Delight Ovens, MD   30 mg at 02/11/12 1800  . fenofibrate tablet 160 mg  160 mg Oral Daily Wilmon Pali, PA   160 mg at 02/11/12 1041  . ferrous sulfate tablet 325 mg  325 mg Oral BID Delight Ovens, MD   325 mg at 02/11/12 2147  . [COMPLETED] furosemide (LASIX) 10 MG/ML injection           . [COMPLETED] furosemide (LASIX) injection 40 mg  40 mg Intravenous Once Erin Barrett, PA   40 mg at 02/11/12 1038  . gabapentin (NEURONTIN) capsule 300 mg  300  mg Oral TID Wilmon Pali, PA   300 mg at 02/11/12 2147  . insulin aspart (novoLOG) injection 0-24 Units  0-24 Units Subcutaneous TID AC & HS Delight Ovens, MD   2 Units at 02/11/12 2151  . insulin aspart (novoLOG) injection 8 Units  8 Units Subcutaneous TID WC Wilmon Pali, PA   8 Units at 02/11/12 1630  . insulin glargine (LANTUS) injection 20 Units  20 Units Subcutaneous BID Delight Ovens, MD   20 Units at 02/11/12 2152  . magnesium hydroxide (MILK OF MAGNESIA) suspension 30 mL  30 mL Oral Daily PRN Delight Ovens, MD      . metoprolol tartrate (LOPRESSOR) tablet 25 mg  25 mg Oral BID Wilmon Pali, PA   25 mg at 02/11/12 2149  . [COMPLETED] mupirocin ointment (BACTROBAN) 2 % 1 application  1 application Nasal BID Delight Ovens, MD   1 application at 02/11/12 1041  . ondansetron (ZOFRAN) tablet 4 mg  4 mg Oral Q6H PRN Delight Ovens, MD       Or  . ondansetron [facility]) injection 4 mg  4 mg Intravenous  Q6H PRN Delight Ovens, MD   4 mg at 02/10/12 2206  . oxyCODONE (Oxy IR/ROXICODONE) immediate release tablet 5-10 mg  5-10 mg Oral Q3H PRN Delight Ovens, MD   10 mg at 02/09/12 1038  . pantoprazole (PROTONIX) EC tablet 40 mg  40 mg Oral QAC breakfast Delight Ovens, MD   40 mg at 02/12/12 1610  . sertraline (ZOLOFT) tablet 150 mg  150 mg Oral Daily Wilmon Pali, PA   150 mg at 02/11/12 1039  . simvastatin (ZOCOR) tablet 20 mg  20 mg Oral q1800 Wilmon Pali, PA   20 mg at 02/11/12 1800  . sodium chloride 0.9 % injection 3 mL  3 mL Intravenous Q12H Delight Ovens, MD   3 mL at 02/11/12 2152  . sodium chloride 0.9 % injection 3 mL  3 mL Intravenous PRN Delight Ovens, MD   3 mL at 02/10/12 2206  . Tamsulosin HCl (FLOMAX) capsule 0.4 mg  0.4 mg Oral QHS Rowe Clack, PA   0.4 mg at 02/11/12 2148  . traMADol (ULTRAM) tablet 50 mg  50 mg Oral Q12H Delight Ovens, MD   50 mg at 02/11/12 2150    PE: General appearance: alert, cooperative and no  distress Lungs: clear to auscultation bilaterally Heart: regular rate and rhythm, S1, S2 normal, no murmur, click, rub or gallop Extremities: 2+ LEE Pulses: Radials 2+ and symmetric Neurologic: Grossly normal  Lab Results:  No results found for this basename: WBC:3,HGB:3,HCT:3,PLT:3 in the last 72 hours BMET  [facility] 02/10/12 0455  NA 136  K 3.9  CL 99  CO2 28  GLUCOSE 166*  BUN 26*  CREATININE 1.64*  CALCIUM 8.6    Assessment/Plan  Active Problems:  DIABETES MELLITUS, TYPE I  HYPERTENSION  GERD  Dyslipidemia  Chronic kidney disease  Coronary arteriography abnormal  S/P CABG x 5  Plan:  Maintaining NSR at 71 BMP.  BP stable.  ASA, Norvasc, fenofibrate, Lopressor, Simvastatin.  Will arrange FU at [facility].  Being DCd today.    LOS: 6 days    Joseph [name], Joseph [name] 02/12/2012 8:38 AM   Agree with note written by Jones Skene PAC  POD # 6 CABG. Going home today. NSR. Mild BLE edema. ROV with Dr. Rennis Golden.  Runell Gess 02/12/2012 9:54 AM

## 2012-02-12 NOTE — Progress Notes (Signed)
Pt discharged home with daughter. Pt's walker and tub bench delivered to room prior to discharge. Pt and daughter given discharge instructions and prescriptions. Pt and daughter verbalized understanding of all instructions and prescriptions. The pt was given a prescription for zofran instead of oxy IR and he was also give and additional prescription of zofran for nausea. The pt and daughter had no questions or concerns. Orson Ape D 02/12/2012

## 2012-02-12 NOTE — Progress Notes (Signed)
                    [address]            [place] [address]          [phone]     6 Days Post-Op Procedure(s) (LRB): CORONARY ARTERY BYPASS GRAFTING (CABG) ([name])  Subjective: Feels well, no complaints.  Breathing stable, off O2.    Objective: Vital signs in last 24 hours: Patient Vitals for the past 24 hrs:  BP Temp Temp src Pulse Resp SpO2 Weight  02/12/12 0410 122/77 mmHg 98.3 F (36.8 C) Oral 63  18  92 % 222 lb 10.6 oz (101 kg)  02/11/12 2257 - - - 72  18  95 % -  02/11/12 2040 133/83 mmHg 99.1 F (37.3 C) Oral 75  19  94 % -  02/11/12 1433 121/70 mmHg 98.4 F (36.9 C) - 65  18  91 % -  02/11/12 1046 150/68 mmHg - - 77  - 91 % -   Current Weight  02/12/12 222 lb 10.6 oz (101 kg)  PRE-OPERATIVE WEIGHT: 99 kg    Intake/Output from previous day: 11/17 0701 - 11/18 0700 In: 480 [P.O.:480] Out: -   CBGs 132-127-71  PHYSICAL EXAM:  Heart: RRR Lungs: Clear Wound: Clean and dry Extremities: Mild LE edema, R>L    Lab Results: CBC:No results found for this basename: WBC:2,HGB:2,HCT:2,PLT:2 in the last 72 hours BMET:  [facility] 02/10/12 0455  NA 136  K 3.9  CL 99  CO2 28  GLUCOSE 166*  BUN 26*  CREATININE 1.64*  CALCIUM 8.6    PT/INR: No results found for this basename: LABPROT,INR in the last 72 hours    Assessment/Plan: S/P Procedure(s) (LRB): CORONARY ARTERY BYPASS GRAFTING (CABG) ([name]) CV- Maintaining SR. BPs improved. Continue current meds. Chronic renal insufficiency- Cr at baseline. DM- CBGs stable on home meds. GI- sx's resolved.  D/c home today- instructions reviewed with patient.   LOS: 6 days    COLLINS,GINA H 02/12/2012

## 2012-02-12 NOTE — Progress Notes (Signed)
CARDIAC REHAB PHASE I   PRE:  Rate/Rhythm: 70 SR    BP: sitting 126/62    SaO2: 91 RA  MODE:  Ambulation: 400 ft   POST:  Rate/Rhythm: 94     BP: sitting 182/80     SaO2: 94 RA  Ambulated without O2 or RW. Unsteady on feet, encouraged RW at home. SaO2 on finger 85 RA walking but when tried left earlobe SaO2 91-94 RA. This seemed more accurate. Pt denies SOB. Pt very quiet, which he sts he is a quiet man at baseline. Asked many times if something was wrong, he denied problems. Seemed to be in pain or something was bothering him. To recliner after walk, reviewed ed. BP very high after walk, notified RN. She stated his BP 5 min later was 140/70. [id number]  Harriet Masson CES, ACSM

## 2012-02-14 ENCOUNTER — Other Ambulatory Visit: Payer: Self-pay

## 2012-02-15 ENCOUNTER — Telehealth: Payer: Self-pay

## 2012-02-15 MED ORDER — HYDROCODONE-ACETAMINOPHEN 5-325 MG PO TABS: 1.0000 | ORAL_TABLET | Freq: Four times a day (QID) | ORAL | Status: DC | PRN

## 2012-02-15 NOTE — Telephone Encounter (Signed)
Pt's DGT states that tramadol is not managing his pain and would like to try different medication.  [facility] for Norco 5/325 mg 1 tab po every 6-8 hours prn #40, no refill

## 2012-02-23 ENCOUNTER — Telehealth: Payer: Self-pay | Admitting: Thoracic Surgery (Cardiothoracic Vascular Surgery)

## 2012-02-23 MED ORDER — POTASSIUM CHLORIDE ER 10 MEQ PO TBCR: 20.0000 meq | EXTENDED_RELEASE_TABLET | Freq: Every day | ORAL | Status: DC

## 2012-02-23 MED ORDER — FUROSEMIDE 40 MG PO TABS: 40.0000 mg | ORAL_TABLET | Freq: Every day | ORAL | Status: DC

## 2012-02-23 NOTE — Telephone Encounter (Signed)
Telephone call from home health nurse stating that patient had increased lower extremity swelling and diminished breath sounds left lung base.  No cough.  No fever.  Not on any diuretic.  Prescription for lasix 40mg  and potassium chloride faxed to pharmacy and instructed to call our office on Monday morning to speak with nurse and arrange for f/u visit with Dr Tyrone Sage or one of our PA's this coming week.  Purcell Nails 02/23/2012 12:35 PM

## 2012-02-26 ENCOUNTER — Ambulatory Visit (INDEPENDENT_AMBULATORY_CARE_PROVIDER_SITE_OTHER): Payer: Medicare Other | Admitting: Ophthalmology

## 2012-03-05 ENCOUNTER — Other Ambulatory Visit: Payer: Self-pay | Admitting: *Deleted

## 2012-03-05 DIAGNOSIS — T82218A Other mechanical complication of coronary artery bypass graft, initial encounter: Secondary | ICD-10-CM

## 2012-03-07 ENCOUNTER — Ambulatory Visit
Admission: RE | Admit: 2012-03-07 | Discharge: 2012-03-07 | Disposition: A | Discharge status: Home / Self Care | Payer: Medicare Other | Source: Ambulatory Visit | Attending: Cardiothoracic Surgery | Admitting: Cardiothoracic Surgery

## 2012-03-07 ENCOUNTER — Encounter: Payer: Self-pay | Admitting: Cardiothoracic Surgery

## 2012-03-07 ENCOUNTER — Ambulatory Visit (INDEPENDENT_AMBULATORY_CARE_PROVIDER_SITE_OTHER): Payer: Self-pay | Admitting: Cardiothoracic Surgery

## 2012-03-07 VITALS — BP 127/76 | HR 69 | Temp 97.5°F | Resp 20 | Ht 67.0 in | Wt 210.0 lb

## 2012-03-07 DIAGNOSIS — Z951 Presence of aortocoronary bypass graft: Secondary | ICD-10-CM

## 2012-03-07 DIAGNOSIS — T82218A Other mechanical complication of coronary artery bypass graft, initial encounter: Secondary | ICD-10-CM

## 2012-03-07 DIAGNOSIS — I2510 Atherosclerotic heart disease of native coronary artery without angina pectoris: Secondary | ICD-10-CM

## 2012-03-07 NOTE — Progress Notes (Signed)
[address]            [place] [address]          [phone]       MILBERN DOESCHER [facility] Medical Record #[id number] Date of Birth: [birth year]  Chrystie Nose., MD Herb Grays, MD  Chief Complaint:   PostOp Follow Up Visit 02/06/2012  :  OPERATIVE REPORT  PREOPERATIVE DIAGNOSIS: Coronary occlusive disease with unstable  angina.  POSTOPERATIVE DIAGNOSIS: Coronary occlusive disease with unstable  angina.  SURGICAL PROCEDURE: Coronary artery bypass grafting x5 with the left  internal mammary to the left anterior descending coronary artery,  reverse saphenous vein graft to the diagonal coronary artery, reverse  saphenous vein graft to the distal first obtuse marginal, sequential  reverse saphenous vein graft to the posterior descending coronary artery  and the second obtuse marginal coronary artery with right leg endo vein  harvesting.   History of Present Illness:      Patient is making slow progress postoperatively. He is slowly gaining strength and increasing his activity. He's had no overt symptoms of congestive heart failure He's had no recurrent angina.     History  Smoking status  . Never Smoker   Smokeless tobacco  . Never Used       No Known Allergies  Current Outpatient Prescriptions  Medication Sig Dispense Refill  . amLODipine (NORVASC) 10 MG tablet Take 1 tablet (10 mg total) by mouth daily.  30 tablet  1  . aspirin EC 325 MG EC tablet Take 1 tablet (325 mg total) by mouth daily.  30 tablet    . cholecalciferol (VITAMIN D) 1000 UNITS tablet Take 1,000 Units by mouth daily.      . fenofibrate 160 MG tablet Take 160 mg by mouth daily.      . ferrous sulfate 325 (65 FE) MG tablet Take 325 mg by mouth 2 (two) times daily.      . furosemide (LASIX) 40 MG tablet Take 1 tablet (40 mg total) by mouth daily.  30 tablet  0  . gabapentin (NEURONTIN) 300 MG capsule Take 300 mg by mouth 3 (three) times daily.      Marland Kitchen  HYDROcodone-acetaminophen (NORCO) 5-325 MG per tablet Take 1 tablet by mouth every 6 (six) hours as needed for pain.  40 tablet  0  . insulin aspart (NOVOLOG) 100 UNIT/ML injection Inject 10-15 Units into the skin 3 (three) times daily before meals. Per sliding scale      . insulin glargine (LANTUS) 100 UNIT/ML injection Inject 20 Units into the skin 2 (two) times daily.      . metoprolol tartrate (LOPRESSOR) 25 MG tablet Take 1 tablet (25 mg total) by mouth 2 (two) times daily.  60 tablet  1  . omeprazole (PRILOSEC) 20 MG capsule Take 20 mg by mouth daily.      . potassium chloride (K-DUR) 10 MEQ tablet Take 2 tablets (20 mEq total) by mouth daily.  30 tablet  0  . pravastatin (PRAVACHOL) 40 MG tablet Take 40 mg by mouth daily.      . sertraline (ZOLOFT) 100 MG tablet Take 150 mg by mouth daily.      . Tamsulosin HCl (FLOMAX) 0.4 MG CAPS Take 0.4 mg by mouth daily after supper.      . ondansetron (ZOFRAN) 4 MG tablet Take 1 tablet (4 mg  total) by mouth every 6 (six) hours as needed for nausea.  7 tablet  0  . traMADol (ULTRAM) 50 MG tablet Take 1 tablet (50 mg total) by mouth every 12 (twelve) hours.  45 tablet  0       Physical Exam: BP 127/76  Pulse 69  Temp 97.5 F (36.4 C) (Oral)  Resp 20  Ht 5\' 7"  (1.702 m)  Wt 210 lb (95.255 kg)  BMI 32.89 kg/m2  SpO2 98%  General appearance: alert and cooperative Neurologic: intact Heart: regular rate and rhythm, S1, S2 normal, no murmur, click, rub or gallop and normal apical impulse Lungs: clear to auscultation bilaterally and normal percussion bilaterally Abdomen: soft, non-tender; bowel sounds normal; no masses,  no organomegaly Extremities: extremities normal, atraumatic, no cyanosis or edema, Homans sign is negative, no sign of DVT and leg vein harvest site healing well Wounds:  Diagnostic Studies & Laboratory data:         Recent Radiology Findings: Dg Chest 2 View  03/07/2012  *RADIOLOGY REPORT*  Clinical Data: Coronary surgery  1 month ago  CHEST - 2 VIEW  Comparison: 02/09/2012  Findings: Postsurgical changes are seen.  Cardiac shadow is stable. The lungs are well-aerated with left basilar atelectasis similar to that seen on the prior exam.  No new focal abnormality is noted.  IMPRESSION: Left basilar changes stable in appearance.   Original Report Authenticated By: Alcide Clever, M.D.       Recent Labs: Lab Results  Component Value Date   WBC 9.0 02/09/2012   HGB 8.9* 02/09/2012   HCT 27.5* 02/09/2012   PLT 233 02/09/2012   GLUCOSE 166* 02/10/2012   CHOL 103 01/31/2012   TRIG 152* 01/31/2012   HDL 28* 01/31/2012   LDLCALC 45 01/31/2012   ALT 16 02/05/2012   AST 19 02/05/2012   NA 136 02/10/2012   K 3.9 02/10/2012   CL 99 02/10/2012   CREATININE 1.64* 02/10/2012   BUN 26* 02/10/2012   CO2 28 02/10/2012   INR 1.43 02/06/2012   HGBA1C 6.7* 02/05/2012      Assessment / Plan:      Slow steady progress following coronary artery bypass grafting I recommended to the patient that he proceed with cardiac rehabilitation to increase his physical strength and endurance He is agreeable with this He has a followup appointment with cardiology next week I've not made him a return appointment to see me but would be glad to see him in his or Dr. Rennis Golden is requested anytime.    Keir Foland B 03/07/2012 4:02 PM

## 2012-03-07 NOTE — Patient Instructions (Addendum)
Start Cardiac Rehab No lifting over 25 lbs for 3 months   Coronary Artery Bypass Grafting Care After Refer to this sheet in the next few weeks. These instructions provide you with information on caring for yourself after your procedure. Your caregiver may also give you more specific instructions. Your treatment has been planned according to current medical practices, but problems sometimes occur. Call your caregiver if you have any problems or questions after your procedure.  Recovery from open heart surgery will be different for everyone. Some people feel well after 3 or 4 weeks, while for others it takes longer. After heart surgery, it may be normal to:  Not have an appetite, feel nauseated by the smell of food, or only want to eat a small amount.  Be constipated because of changes in your diet, activity, and medicines. Eat foods high in fiber. Add fresh fruits and vegetables to your diet. Stool softeners may be helpful.  Feel sad or unhappy. You may be frustrated or cranky. You may have good days and bad days. Do not give up. Talk to your caregiver if you do not feel better.  Feel weakness and fatigue. You many need physical therapy or cardiac rehabilitation to get your strength back.  Develop an irregular heartbeat called atrial fibrillation. Symptoms of atrial fibrillation are a fast, irregular heartbeat or feelings of fluttery heartbeats, shortness of breath, low blood pressure, and dizziness. If these symptoms develop, see your caregiver right away. MEDICATION  Have a list of all the medicines you will be taking when you leave the hospital. For every medicine, know the following:  Name.  Exact dose.  Time of day to be taken.  How often it should be taken.  Why you are taking it.  Ask which medicines should or should not be taken together. If you take more than one heart medicine, ask if it is okay to take them together. Some heart medicines should not be taken at the same time  because they may lower your blood pressure too much.  Narcotic pain medicine can cause constipation. Eat fresh fruits and vegetables. Add fiber to your diet. Stool softener medicine may help relieve constipation.  Keep a copy of your medicines with you at all times.  Do not add or stop taking any medicine until you check with your caregiver.  Medicines can have side effects. Call your caregiver who prescribed the medicine if you:  Start throwing up, have diarrhea, or have stomach pain.  Feel dizzy or lightheaded when you stand up.  Feel your heart is skipping beats or is beating too fast or too slow.  Develop a rash.  Notice unusual bruising or bleeding. HOME CARE INSTRUCTIONS  After heart surgery, it is important to learn how to take your pulse. Have your caregiver show you how to take your pulse.  Use your incentive spirometer. Ask your caregiver how long after surgery you need to use it. Care of your chest incision  Tell your caregiver right away if you notice clicking in your chest (sternum).  Support your chest with a pillow or your arms when you take deep breaths and cough.  Follow your caregiver's instructions about when you can bathe or swim.  Protect your incision from sunlight during the first year to keep the scar from getting dark.  Tell your caregiver if you notice:  Increased tenderness of your incision.  Increased redness or swelling around your incision.  Drainage or pus from your incision. Care of your leg  incision(s)  Avoid crossing your legs.  Avoid sitting for long periods of time. Change positions every half hour.  Elevate your leg(s) when you are sitting.  Check your leg(s) daily for swelling. Check the incisions for redness or drainage.  Wear your elastic stockings as told by your caregiver. Take them off at bedtime. Diet  Diet is very important to heart health.  Eat plenty of fresh fruits and vegetables. Meats should be lean cut. Avoid  canned, processed, and fried foods.  Talk to a dietician. They can teach you how to make healthy food and drink choices. Weight  Weigh yourself every day. This is important because it helps to know if you are retaining fluid that may make your heart and lungs work harder.  Use the same scale each time.  Weigh yourself every morning at the same time. You should do this after you go to the bathroom, but before you eat breakfast.  Your weight will be more accurate if you do not wear any clothes.  Record your weight.  Tell your caregiver if you have gained 2 pounds or more overnight. Activity Stop any activity at once if you have chest pain, shortness of breath, irregular heartbeats, or dizziness. Get help right away if you have any of these symptoms.  Bathing.  Avoid soaking in a bath or hot tub until your incisions are healed.  Rest. You need a balance of rest and activity.  Exercise. Exercise per your caregiver's advice. You may need physical therapy or cardiac rehabilitation to help strengthen your muscles and build your endurance.  Climbing stairs. Unless your caregiver tells you not to climb stairs, go up stairs slowly and rest if you tire. Do not pull yourself up by the handrail.  Driving a car. Follow your caregiver's advice on when you may drive. You may ride as a passenger at any time. When traveling for long periods of time in a car, get out of the car and walk around for a few minutes every 2 hours.  Lifting. Avoid lifting, pushing, or pulling anything heavier than 10 pounds for 6 weeks after surgery or as told by your caregiver.  Returning to work. Check with your caregiver. People heal at different rates. Most people will be able to go back to work 6 to 12 weeks after surgery.  Sexual activity. You may resume sexual relations as told by your caregiver. SEEK MEDICAL CARE IF:  Any of your incisions are red, painful, or have any type of drainage coming from them.  You  have an oral temperature above 102 F (38.9 C).  You have ankle or leg swelling.  You have pain in your legs.  You have weight gain of 2 or more pounds a day.  You feel dizzy or lightheaded when you stand up. SEEK IMMEDIATE MEDICAL CARE IF:  You have angina or chest pain that goes to your jaw or arms. Call your local emergency services right away.  You have shortness of breath at rest or with activity.  You have a fast or irregular heartbeat (arrhythmia).  There is a "clicking" in your sternum when you move.  You have numbness or weakness in your arms or legs. MAKE SURE YOU:  Understand these instructions.  Will watch your condition.  Will get help right away if you are not doing well or get worse. Document Released: 09/30/2004 Document Revised: 06/05/2011 Document Reviewed: 05/18/2010 [facility] Patient Information 2013 [place], [place].

## 2012-04-05 ENCOUNTER — Ambulatory Visit (INDEPENDENT_AMBULATORY_CARE_PROVIDER_SITE_OTHER): Payer: Medicare Other | Admitting: Ophthalmology

## 2012-04-09 ENCOUNTER — Other Ambulatory Visit: Payer: Self-pay | Admitting: Physician Assistant

## 2012-06-13 ENCOUNTER — Ambulatory Visit (INDEPENDENT_AMBULATORY_CARE_PROVIDER_SITE_OTHER): Payer: Medicare Other | Admitting: Ophthalmology

## 2012-06-13 DIAGNOSIS — H43819 Vitreous degeneration, unspecified eye: Secondary | ICD-10-CM

## 2012-06-13 DIAGNOSIS — H35039 Hypertensive retinopathy, unspecified eye: Secondary | ICD-10-CM

## 2012-06-13 DIAGNOSIS — E1139 Type 2 diabetes mellitus with other diabetic ophthalmic complication: Secondary | ICD-10-CM

## 2012-06-13 DIAGNOSIS — I10 Essential (primary) hypertension: Secondary | ICD-10-CM

## 2012-07-23 ENCOUNTER — Encounter: Payer: Self-pay | Admitting: *Deleted

## 2012-08-29 ENCOUNTER — Ambulatory Visit (INDEPENDENT_AMBULATORY_CARE_PROVIDER_SITE_OTHER): Payer: Medicare Other | Admitting: Internal Medicine

## 2012-08-29 ENCOUNTER — Encounter: Payer: Self-pay | Admitting: Internal Medicine

## 2012-08-29 VITALS — BP 128/78 | HR 57 | Ht 67.0 in | Wt 216.9 lb

## 2012-08-29 DIAGNOSIS — N182 Chronic kidney disease, stage 2 (mild): Secondary | ICD-10-CM

## 2012-08-29 DIAGNOSIS — E109 Type 1 diabetes mellitus without complications: Secondary | ICD-10-CM

## 2012-08-29 DIAGNOSIS — Z951 Presence of aortocoronary bypass graft: Secondary | ICD-10-CM

## 2012-08-29 DIAGNOSIS — E785 Hyperlipidemia, unspecified: Secondary | ICD-10-CM

## 2012-08-29 DIAGNOSIS — I2510 Atherosclerotic heart disease of native coronary artery without angina pectoris: Secondary | ICD-10-CM

## 2012-08-29 DIAGNOSIS — I10 Essential (primary) hypertension: Secondary | ICD-10-CM

## 2012-08-29 NOTE — Patient Instructions (Signed)
Return annually

## 2012-08-29 NOTE — Progress Notes (Signed)
OFFICE NOTE  Chief Complaint:  Routine followup  Primary Care Physician: Herb Grays, MD  HPI:  Joseph [name] is a 57 year old gentleman who recently underwent a MET test with a low VO2. I was concerned about multi-vessel coronary disease and then performed cardiac catheterization on him on January 30, 2012. This demonstrated proximal to mid LAD disease and disease of 2 large OM vessels concerning for surgical disease. I talked with Tyrone Sage and he recommended bypass surgery. He did undergo bypass surgery on February 06, 2012 with a 5-vessel bypass, a LIMA to the LAD, a saphenous vein to the diagonal, a saphenous vein to the distal OM, and sequential reverse saphenous vein graft to the posterior descending and 2nd OM. He did well with this, although it was complicated with some postoperative nausea. He has had some shortness of breath, weakness and slow recovery. He has also had some tenderness in the chest wall around the area of the sternum and incision which he was told by Dr. Tyrone Sage could take some time to improve. We have also made some adjustments in his medicines. He is undergoing home occupational therapy by Genevieve Norlander and they are recommending more upper extremity work which I agree with.  Today he continues to complain about tightness across the chest wall. The midline incision does demonstrate a keloid scar, and this may be responsible for his feeling of tightness. It potentially is possible that the sternum was too closely approximated, however Dr. Tyrone Sage did not indicate the event this was a problem. Overall Mr. Sako is doing well without complaints of shortness of breath, chest pain, palpitations, or syncope. Does report some mild lower stray swelling, but his weight has generally been the same.  He is followed by Dr. Kathrene Bongo for chronic kidney disease.  PMHx:  Past Medical History  Diagnosis Date  . Hypertension   . Depression   . Chronic kidney disease     " My kidneys  are not functioning right "  . GERD (gastroesophageal reflux disease)   . Anemia   . Coronary artery disease     Dr Rennis Golden @ [facility] cardiology [place]  . Sleep apnea 2010    refuses to use cpap  . Diabetes mellitus without complication 1988    insulin dependent  . Chronic renal insufficiency, stage III (moderate)     [place] [facility]  . Dyslipidemia     Past Surgical History  Procedure Laterality Date  . Ankle fracture surgery    . Coronary artery bypass graft  02/06/2012    Procedure: CORONARY ARTERY BYPASS GRAFTING (CABG); 5- Vessel bypass a LIMA to the LAD, a saphenous vein to the to the diagonal , a saphenous vein to the distal OM, and sequential reverse saphenous vien graft to the posterior descending and 2nd OM. Surgeon: Delight Ovens, MD;  Location: [facility] OR;  Service: Open Heart Surgery;  Laterality: [name];  . Cardiac catheterization  01/30/2012    This demonstrated proximal to mid LAD disease and disease of 2 large OM vessels concerning for surgical disease.    FAMHx:  History reviewed. No pertinent family history.  SOCHx:   reports that he has never smoked. He has never used smokeless tobacco. He reports that he does not drink alcohol or use illicit drugs.  ALLERGIES:  No Known Allergies  ROS: A comprehensive review of systems was negative except for: Cardiovascular: positive for fatigue and lower extremity edema Integument/breast: positive for Midline incision keloid scar Musculoskeletal: positive for myalgias  HOME MEDS:  Current Outpatient Prescriptions  Medication Sig Dispense Refill  . aspirin 81 MG tablet Take 81 mg by mouth daily.      . cholecalciferol (VITAMIN D) 1000 UNITS tablet Take 1,000 Units by mouth daily.      . fenofibrate 160 MG tablet Take 160 mg by mouth daily.      . ferrous sulfate 325 (65 FE) MG tablet Take 325 mg by mouth 2 (two) times daily.      . furosemide (LASIX) 40 MG tablet Take 1 tablet (40 mg total) by mouth daily.  30 tablet   0  . gabapentin (NEURONTIN) 300 MG capsule Take 300 mg by mouth 3 (three) times daily.      Marland Kitchen HYDROcodone-acetaminophen (NORCO) 5-325 MG per tablet Take 1 tablet by mouth every 6 (six) hours as needed for pain.  40 tablet  0  . insulin aspart (NOVOLOG) 100 UNIT/ML injection Inject 10-15 Units into the skin 3 (three) times daily before meals. Per sliding scale      . insulin glargine (LANTUS) 100 UNIT/ML injection Inject 20 Units into the skin 2 (two) times daily.      Marland Kitchen lisinopril (PRINIVIL,ZESTRIL) 20 MG tablet Take 20 mg by mouth daily.      . metoprolol tartrate (LOPRESSOR) 25 MG tablet Take 1 tablet (25 mg total) by mouth 2 (two) times daily.  60 tablet  1  . omeprazole (PRILOSEC) 20 MG capsule Take 20 mg by mouth daily.      . ondansetron (ZOFRAN) 4 MG tablet Take 1 tablet (4 mg total) by mouth every 6 (six) hours as needed for nausea.  7 tablet  0  . pravastatin (PRAVACHOL) 40 MG tablet Take 40 mg by mouth daily.      . sertraline (ZOLOFT) 100 MG tablet Take 150 mg by mouth daily.      . Tamsulosin HCl (FLOMAX) 0.4 MG CAPS Take 0.4 mg by mouth daily after supper.      . traMADol (ULTRAM) 50 MG tablet Take 1 tablet (50 mg total) by mouth every 12 (twelve) hours.  45 tablet  0  . ALPHAGAN P 0.15 % ophthalmic solution daily.      Marland Kitchen ketorolac (ACULAR) 0.5 % ophthalmic solution       . lisinopril-hydrochlorothiazide (PRINZIDE,ZESTORETIC) 20-25 MG per tablet       . prednisoLONE acetate (PRED FORTE) 1 % ophthalmic suspension        No current facility-administered medications for this visit.    LABS/IMAGING: No results found for this or any previous visit (from the past 48 hour(s)). No results found.  VITALS: BP 128/78  Pulse 57  Ht 5\' 7"  (1.702 m)  Wt 216 lb 14.4 oz (98.385 kg)  BMI 33.96 kg/m2  EXAM: General appearance: alert and no distress Neck: no adenopathy, no carotid bruit, no JVD, supple, symmetrical, trachea midline and thyroid not enlarged, symmetric, no  tenderness/mass/nodules Lungs: clear to auscultation bilaterally Heart: regular rate and rhythm, S1, S2 normal, no murmur, click, rub or gallop Abdomen: soft, non-tender; bowel sounds normal; no masses,  no organomegaly Extremities: extremities normal, atraumatic, no cyanosis or edema Pulses: 2+ and symmetric Skin: Hypertrophic scar of the midline sternotomy incision Neurologic: Grossly normal  EKG: Sinus bradycardia at 50  ASSESSMENT: 1. Coronary disease status post 5 vessel bypass in November 2013, with LIMA to LAD, SVG to diagonal, SVG to OM, SVG to posterior descending and SVG to the second OM 2. Insulin-dependent diabetes 3. Dyslipidemia 4. Hypertension 5. Obesity  6. Chronic kidney disease stage II  PLAN: 1.   Overall Mr. Lazaro is doing very well. He is not complaining of any new chest pain symptoms. He does report there is tightness across his chest, I suspect this is due to his hypertrophic keloid scar. I recommended over-the-counter topical kelocote, which she could apply topically to help soften the scar. If this is continuing to be a problem he may need to see a dermatologist for steroid injections or perhaps a Engineer, petroleum for scar revision.  He should continue his current medical regimen we'll see him back annually or sooner as necessary.  Chrystie Nose, MD, [facility] Attending Cardiologist [facility]  Kailani Brass C 08/29/2012, 11:44 AM

## 2012-10-11 ENCOUNTER — Other Ambulatory Visit: Payer: Self-pay | Admitting: *Deleted

## 2012-10-11 MED ORDER — METOPROLOL TARTRATE 25 MG PO TABS: 25.0000 mg | ORAL_TABLET | Freq: Two times a day (BID) | ORAL | Status: DC

## 2012-10-11 NOTE — Telephone Encounter (Signed)
Refills sent to pharmacy. 

## 2012-11-08 ENCOUNTER — Telehealth: Payer: Self-pay

## 2012-11-08 MED ORDER — FUROSEMIDE 20 MG PO TABS: 20.0000 mg | ORAL_TABLET | Freq: Every day | ORAL | Status: DC

## 2012-11-08 NOTE — Telephone Encounter (Signed)
Rx was sent to pharmacy electronically. 

## 2012-12-16 ENCOUNTER — Ambulatory Visit (INDEPENDENT_AMBULATORY_CARE_PROVIDER_SITE_OTHER): Payer: Medicare Other | Admitting: Ophthalmology

## 2012-12-16 DIAGNOSIS — E11359 Type 2 diabetes mellitus with proliferative diabetic retinopathy without macular edema: Secondary | ICD-10-CM

## 2012-12-16 DIAGNOSIS — E1139 Type 2 diabetes mellitus with other diabetic ophthalmic complication: Secondary | ICD-10-CM

## 2012-12-16 DIAGNOSIS — I10 Essential (primary) hypertension: Secondary | ICD-10-CM

## 2012-12-16 DIAGNOSIS — H35039 Hypertensive retinopathy, unspecified eye: Secondary | ICD-10-CM

## 2013-02-25 ENCOUNTER — Ambulatory Visit (INDEPENDENT_AMBULATORY_CARE_PROVIDER_SITE_OTHER): Payer: Medicare Other | Admitting: Internal Medicine

## 2013-02-25 ENCOUNTER — Encounter: Payer: Self-pay | Admitting: Internal Medicine

## 2013-02-25 VITALS — BP 126/78 | HR 61 | Ht 67.0 in | Wt 213.5 lb

## 2013-02-25 DIAGNOSIS — I2510 Atherosclerotic heart disease of native coronary artery without angina pectoris: Secondary | ICD-10-CM

## 2013-02-25 DIAGNOSIS — Z951 Presence of aortocoronary bypass graft: Secondary | ICD-10-CM

## 2013-02-25 DIAGNOSIS — N189 Chronic kidney disease, unspecified: Secondary | ICD-10-CM

## 2013-02-25 DIAGNOSIS — E109 Type 1 diabetes mellitus without complications: Secondary | ICD-10-CM

## 2013-02-25 DIAGNOSIS — I10 Essential (primary) hypertension: Secondary | ICD-10-CM

## 2013-02-25 DIAGNOSIS — E785 Hyperlipidemia, unspecified: Secondary | ICD-10-CM

## 2013-02-25 NOTE — Patient Instructions (Signed)
Dr [name] wants you to follow-up in 1 year. You will receive a reminder letter in the mail two months in advance. If you don't receive a letter, please call our office to schedule the follow-up appointment. 

## 2013-02-25 NOTE — Progress Notes (Signed)
OFFICE NOTE  Chief Complaint:  Routine followup  Primary Care Physician: Herb Grays, MD  HPI:  Joseph [name] is a 57 year old gentleman who recently underwent a MET test with a low VO2. I was concerned about multi-vessel coronary disease and then performed cardiac catheterization on him on January 30, 2012. This demonstrated proximal to mid LAD disease and disease of 2 large OM vessels concerning for surgical disease. I talked with Tyrone Sage and he recommended bypass surgery. He did undergo bypass surgery on February 06, 2012 with a 5-vessel bypass, a LIMA to the LAD, a saphenous vein to the diagonal, a saphenous vein to the distal OM, and sequential reverse saphenous vein graft to the posterior descending and 2nd OM. He did well with this, although it was complicated with some postoperative nausea. He has had some shortness of breath, weakness and slow recovery. He has also had some tenderness in the chest wall around the area of the sternum and incision which he was told by Dr. Tyrone Sage could take some time to improve. We have also made some adjustments in his medicines. He is undergoing home occupational therapy by Genevieve Norlander and they are recommending more upper extremity work which I agree with.  Today he continues to complain about tightness across the chest wall. The midline incision does demonstrate a keloid scar, and this may be responsible for his feeling of tightness. It potentially is possible that the sternum was too closely approximated, however Dr. Tyrone Sage did not indicate the event this was a problem. Overall [name] is doing well without complaints of shortness of breath, chest pain, palpitations, or syncope. Does report some mild lower stray swelling, but his weight has generally been the same.  He is followed by Dr. Kathrene Bongo for chronic kidney disease. Finally he reports has been having some problems with what sounds like tinea cruris.  He is currently taking a topical  steroid and prednisone.  PMHx:  Past Medical History  Diagnosis Date  . Hypertension   . Depression   . Chronic kidney disease     " My kidneys are not functioning right "  . GERD (gastroesophageal reflux disease)   . Anemia   . Coronary artery disease     Dr Rennis Golden @ [facility] cardiology [place]  . Sleep apnea 2010    refuses to use cpap  . Diabetes mellitus without complication 1988    insulin dependent  . Chronic renal insufficiency, stage III (moderate)     [place] [facility]  . Dyslipidemia     Past Surgical History  Procedure Laterality Date  . Ankle fracture surgery    . Coronary artery bypass graft  02/06/2012    Procedure: CORONARY ARTERY BYPASS GRAFTING (CABG); 5- Vessel bypass a LIMA to the LAD, a saphenous vein to the to the diagonal , a saphenous vein to the distal OM, and sequential reverse saphenous vien graft to the posterior descending and 2nd OM. Surgeon: Delight Ovens, MD;  Location: [facility] OR;  Service: Open Heart Surgery;  Laterality: [name];  . Cardiac catheterization  01/30/2012    This demonstrated proximal to mid LAD disease and disease of 2 large OM vessels concerning for surgical disease.    FAMHx:  History reviewed. No pertinent family history.  SOCHx:   reports that he has never smoked. He has never used smokeless tobacco. He reports that he does not drink alcohol or use illicit drugs.  ALLERGIES:  No Known Allergies  ROS: A comprehensive review of systems was  negative except for: Cardiovascular: positive for fatigue and lower extremity edema Integument/breast: positive for Midline incision keloid scar Musculoskeletal: positive for myalgias  HOME MEDS: Current Outpatient Prescriptions  Medication Sig Dispense Refill  . aspirin 81 MG tablet Take 81 mg by mouth daily.      . cholecalciferol (VITAMIN D) 1000 UNITS tablet Take 1,000 Units by mouth daily.      . Clotrimazole-Betamethasone (LOTRISONE EX) Apply topically as needed.      .  fenofibrate 160 MG tablet Take 160 mg by mouth daily.      . ferrous sulfate 325 (65 FE) MG tablet Take 325 mg by mouth 2 (two) times daily.      . furosemide (LASIX) 20 MG tablet Take 1 tablet (20 mg total) by mouth daily.  30 tablet  10  . gabapentin (NEURONTIN) 300 MG capsule Take 300 mg by mouth 3 (three) times daily.      . indomethacin (INDOCIN) 25 MG capsule Take 25 mg by mouth 3 (three) times daily as needed.      . insulin aspart (NOVOLOG) 100 UNIT/ML injection Inject 10-15 Units into the skin 3 (three) times daily before meals. Per sliding scale      . insulin glargine (LANTUS) 100 UNIT/ML injection Inject 20 Units into the skin 2 (two) times daily.      Marland Kitchen lisinopril (PRINIVIL,ZESTRIL) 20 MG tablet Take 20 mg by mouth daily.      . metoprolol tartrate (LOPRESSOR) 25 MG tablet Take 1 tablet (25 mg total) by mouth 2 (two) times daily.  60 tablet  6  . omeprazole (PRILOSEC) 20 MG capsule Take 40 mg by mouth daily.       . pravastatin (PRAVACHOL) 40 MG tablet Take 40 mg by mouth daily.      . predniSONE (DELTASONE) 20 MG tablet Take 20 mg by mouth daily with breakfast.      . sertraline (ZOLOFT) 100 MG tablet Take 150 mg by mouth daily.      . Tamsulosin HCl (FLOMAX) 0.4 MG CAPS Take 0.4 mg by mouth daily after supper.       No current facility-administered medications for this visit.    LABS/IMAGING: No results found for this or any previous visit (from the past 48 hour(s)). No results found.  VITALS: BP 126/78  Pulse 61  Ht 5\' 7"  (1.702 m)  Wt 213 lb 8 oz (96.843 kg)  BMI 33.43 kg/m2  EXAM: General appearance: alert and no distress Neck: no adenopathy, no carotid bruit, no JVD, supple, symmetrical, trachea midline and thyroid not enlarged, symmetric, no tenderness/mass/nodules Lungs: clear to auscultation bilaterally Heart: regular rate and rhythm, S1, S2 normal, no murmur, click, rub or gallop Abdomen: soft, non-tender; bowel sounds normal; no masses,  no  organomegaly Extremities: extremities normal, atraumatic, no cyanosis or edema Pulses: 2+ and symmetric Skin: Hypertrophic scar of the midline sternotomy incision Neurologic: Grossly normal  EKG: Sinus rhythm at 61  ASSESSMENT: 1. Coronary disease status post 5 vessel bypass in November 2013, with LIMA to LAD, SVG to diagonal, SVG to OM, SVG to posterior descending and SVG to the second OM 2. Insulin-dependent diabetes 3. Dyslipidemia 4. Hypertension 5. Obesity 6. Chronic kidney disease stage II  PLAN: 1.   Overall Mr. Everton is doing very well. He is not complaining of any new chest pain symptoms. He does not feel it is keloid scars bothering him too much. He reports his diabetes is well controlled. He has as good control  versus hypertension. He is due for recheck of his cholesterol. Plan to see him back annually or sooner as necessary.  Chrystie Nose, MD, [facility] Attending Cardiologist [facility]  Nikolaus Pienta C 02/25/2013, 5:10 PM

## 2013-02-26 ENCOUNTER — Encounter: Payer: Self-pay | Admitting: Internal Medicine

## 2013-05-19 ENCOUNTER — Other Ambulatory Visit: Payer: Self-pay | Admitting: Internal Medicine

## 2013-05-20 NOTE — Telephone Encounter (Signed)
Rx was sent to pharmacy electronically. 

## 2013-06-18 ENCOUNTER — Ambulatory Visit (INDEPENDENT_AMBULATORY_CARE_PROVIDER_SITE_OTHER): Payer: Medicare Other | Admitting: Ophthalmology

## 2013-06-18 DIAGNOSIS — E11359 Type 2 diabetes mellitus with proliferative diabetic retinopathy without macular edema: Secondary | ICD-10-CM

## 2013-06-18 DIAGNOSIS — E1139 Type 2 diabetes mellitus with other diabetic ophthalmic complication: Secondary | ICD-10-CM

## 2013-06-18 DIAGNOSIS — H35039 Hypertensive retinopathy, unspecified eye: Secondary | ICD-10-CM

## 2013-06-18 DIAGNOSIS — I10 Essential (primary) hypertension: Secondary | ICD-10-CM

## 2013-06-18 DIAGNOSIS — E1165 Type 2 diabetes mellitus with hyperglycemia: Secondary | ICD-10-CM

## 2013-06-18 DIAGNOSIS — H26499 Other secondary cataract, unspecified eye: Secondary | ICD-10-CM

## 2013-07-10 ENCOUNTER — Ambulatory Visit (INDEPENDENT_AMBULATORY_CARE_PROVIDER_SITE_OTHER): Payer: Medicare Other | Admitting: Ophthalmology

## 2013-07-21 ENCOUNTER — Other Ambulatory Visit: Payer: Self-pay

## 2013-07-21 MED ORDER — LISINOPRIL 20 MG PO TABS: 20.0000 mg | ORAL_TABLET | Freq: Every day | ORAL | Status: DC

## 2013-07-21 MED ORDER — PRAVASTATIN SODIUM 40 MG PO TABS: 40.0000 mg | ORAL_TABLET | Freq: Every day | ORAL | Status: DC

## 2013-07-21 NOTE — Telephone Encounter (Signed)
Rx was sent to pharmacy electronically. 

## 2013-07-22 ENCOUNTER — Ambulatory Visit (INDEPENDENT_AMBULATORY_CARE_PROVIDER_SITE_OTHER): Payer: Medicare Other | Admitting: Ophthalmology

## 2013-07-22 DIAGNOSIS — H2700 Aphakia, unspecified eye: Secondary | ICD-10-CM

## 2013-10-15 ENCOUNTER — Other Ambulatory Visit: Payer: Self-pay | Admitting: Internal Medicine

## 2013-10-15 NOTE — Telephone Encounter (Signed)
Rx was sent to pharmacy electronically. 

## 2013-11-15 IMAGING — CR DG CHEST 2V
2 series · 2 of 2 positions shown · non-contrast
Comparison: 11/02/2007.  05/18/2005.

CLINICAL DATA: Pre-procedure evaluation.  For heart
catheterization.

CHEST - 2 VIEW

[view not recorded (1 of 2)]
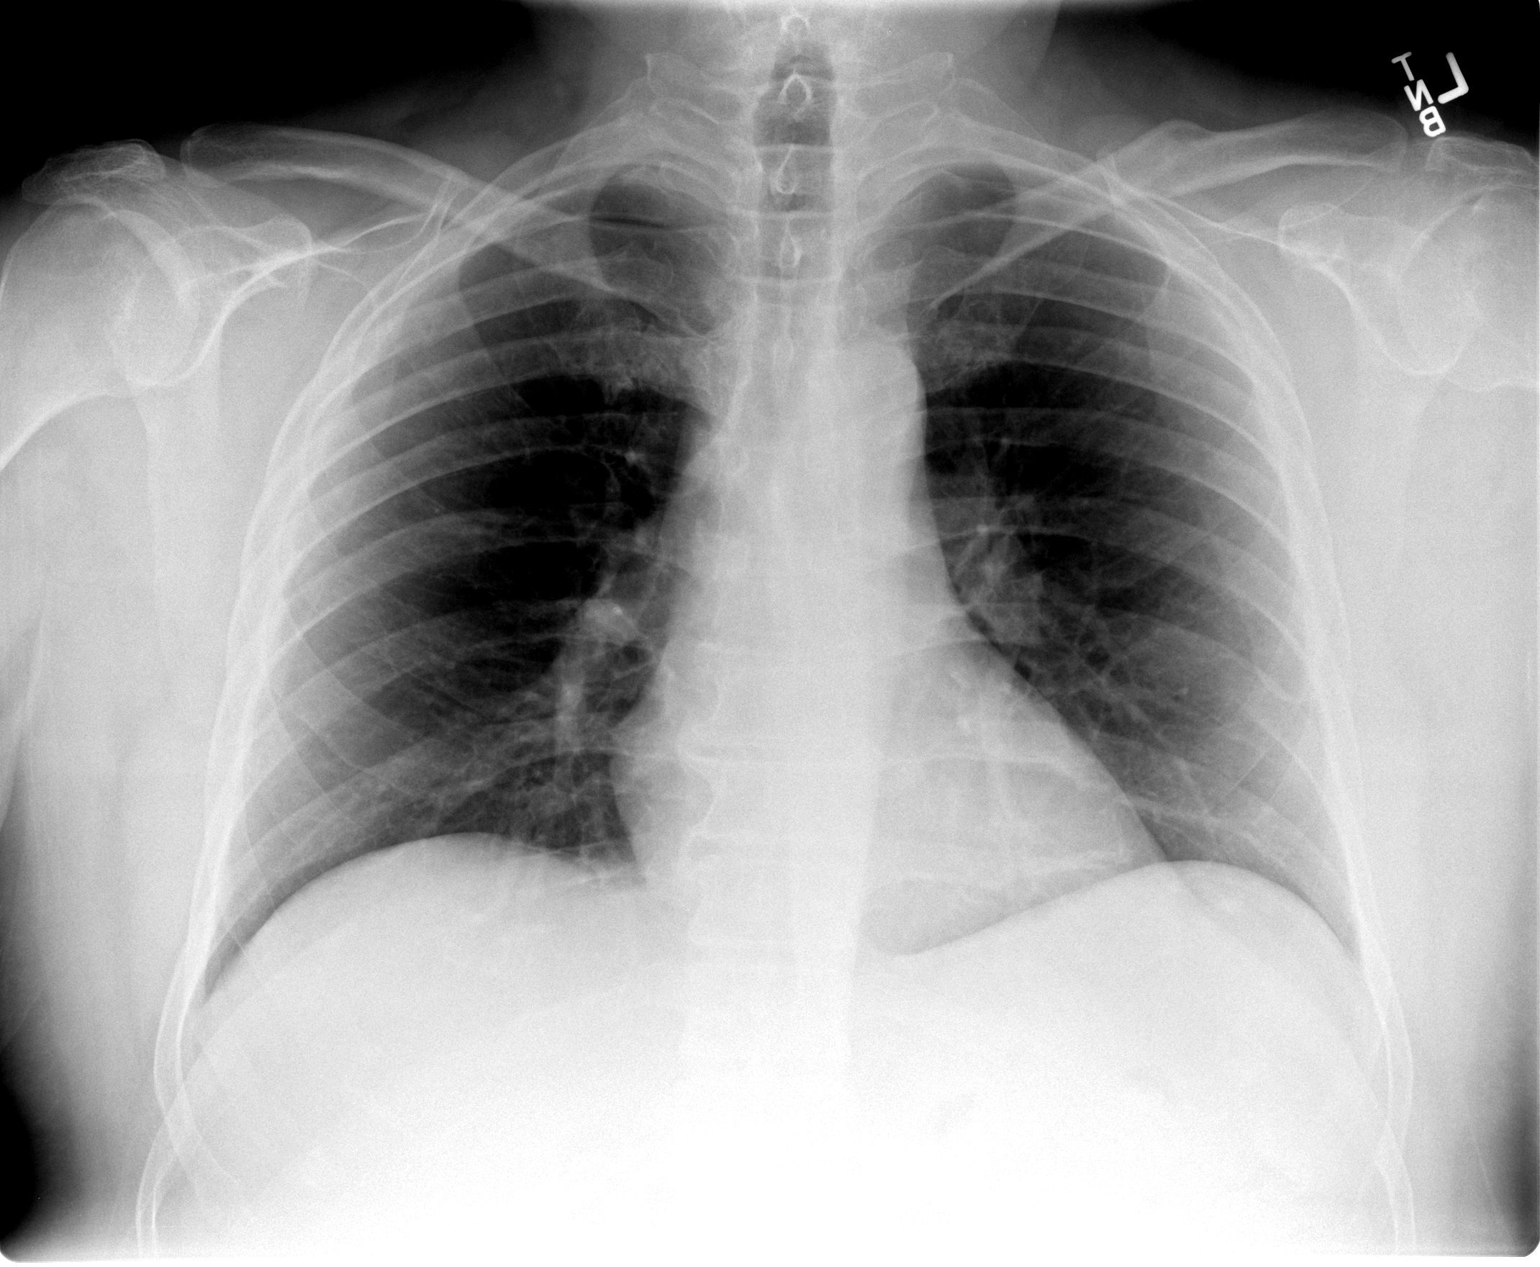

[view not recorded (2 of 2)]
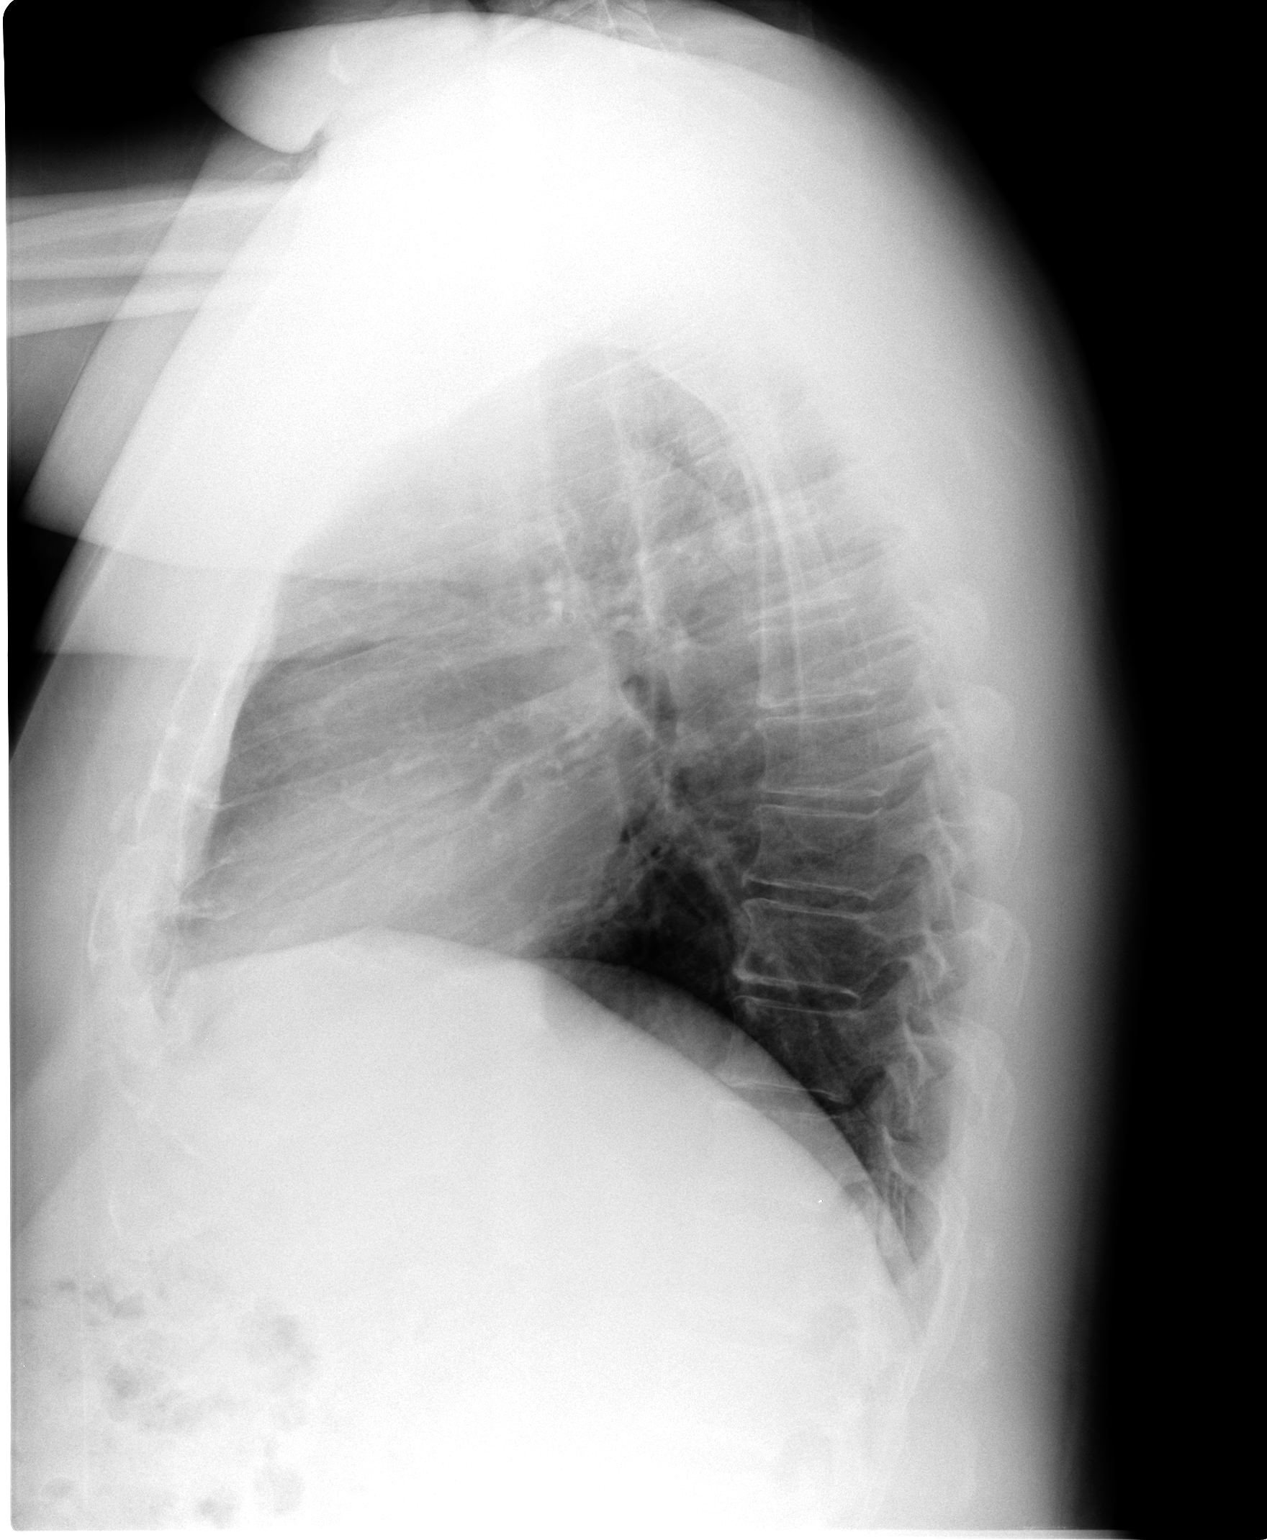

[2 of 2 positions shown; findings below may reference images not displayed]

FINDINGS: There is stable normal appearance of the cardiac
silhouette.  Mediastinal and hilar contours appear stable and
within normal limits.  No pulmonary infiltrates or masses are
evident. No pleural abnormality is evident.  There is minimal
degenerative spondylosis.
IMPRESSION: No acute or active cardiopulmonary or pleural abnormalities are
evident.

## 2013-11-27 IMAGING — CR DG CHEST 1V PORT
1 series · 1 of 1 positions shown · non-contrast
Comparison: 01/25/2012

CLINICAL DATA: Status post CABG.

PORTABLE CHEST - 1 VIEW

[AP]
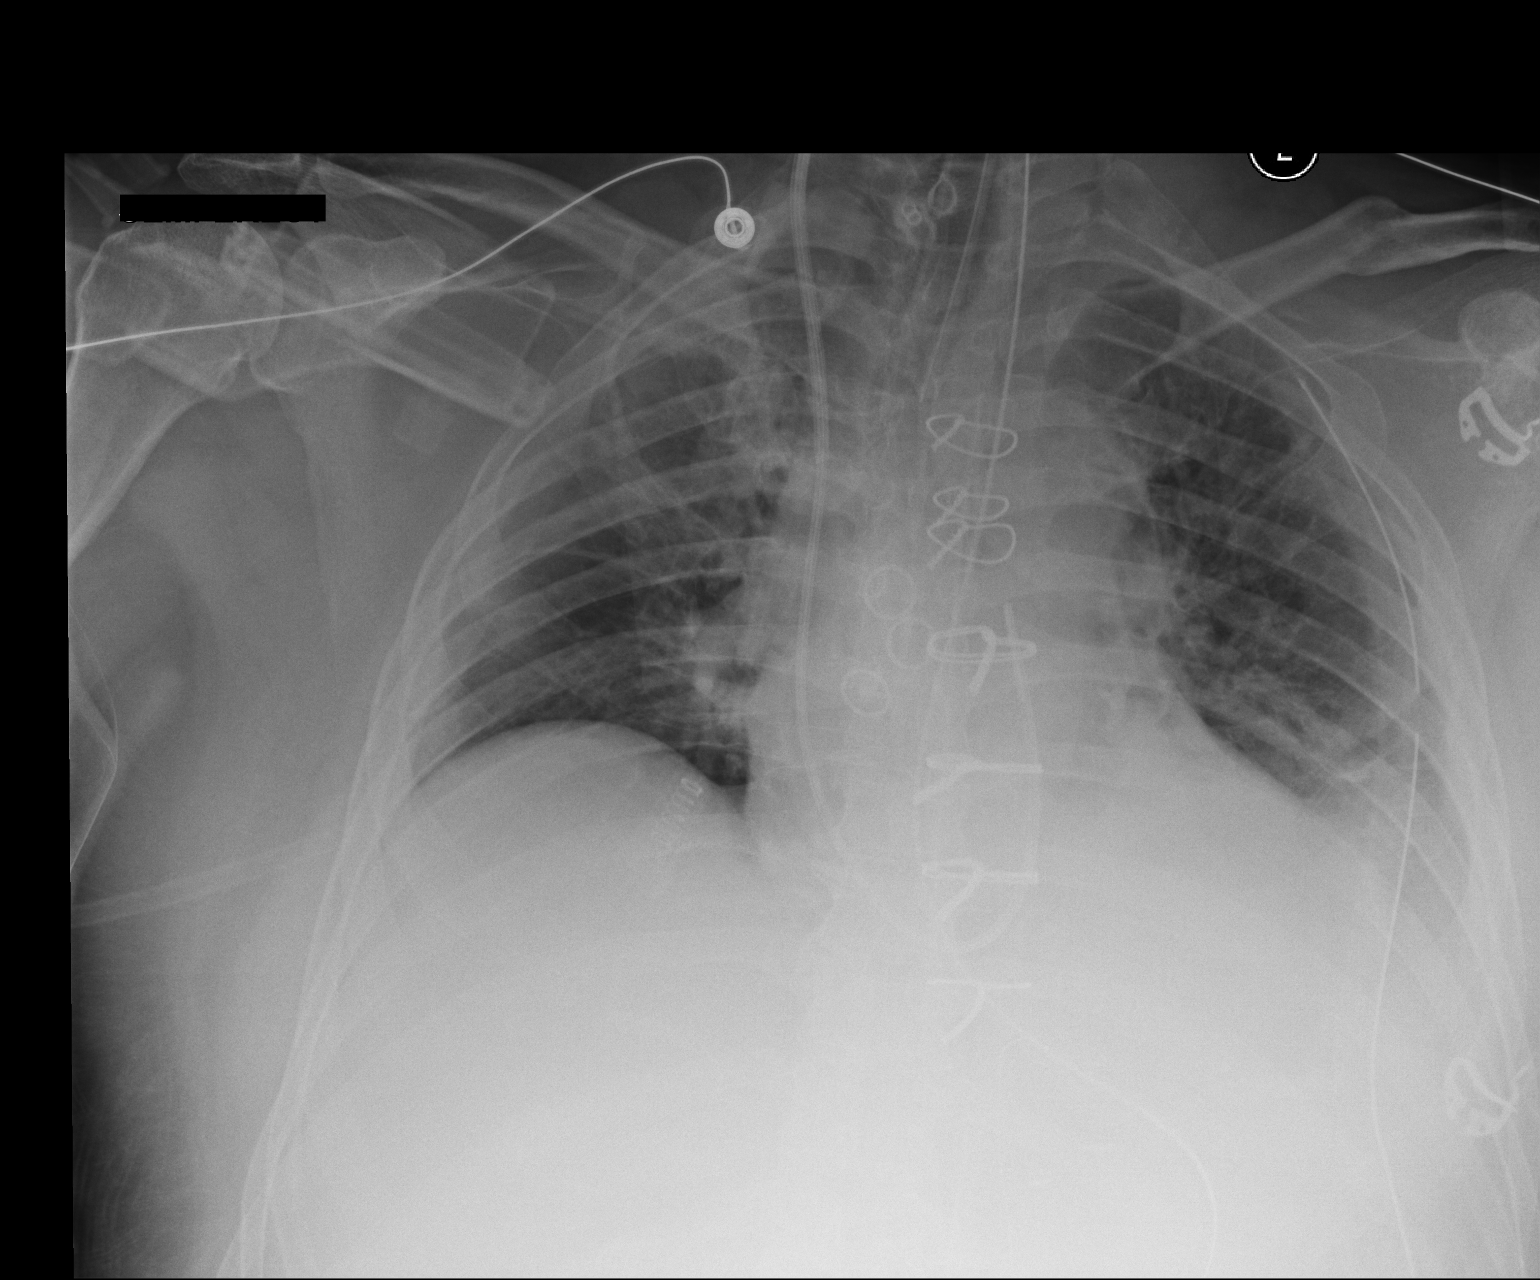

[1 of 1 positions shown; findings below may reference images not displayed]

FINDINGS: Postoperatively, endotracheal tube is present with the
tip approximately 2.5 cm above the carina.  A left chest tube is in
place with no pneumothorax visualized.  Swan-Ganz catheter extends
into the main pulmonary artery.  A nasogastric tube extends into
the stomach.  Lungs show low volumes with atelectasis of the left
lower lobe.  No overt pulmonary edema.  Gross heart size and
mediastinal contours are stable.
IMPRESSION: Low lung volumes and left lower lobe atelectasis postoperatively.
No pneumothorax visualized.

## 2013-11-30 IMAGING — CR DG CHEST 2V
2 series · 2 of 2 positions shown · non-contrast
Comparison: 02/08/2012

CLINICAL DATA: Shortness of breath, chest pain.

CHEST - 2 VIEW

[w chest pa]
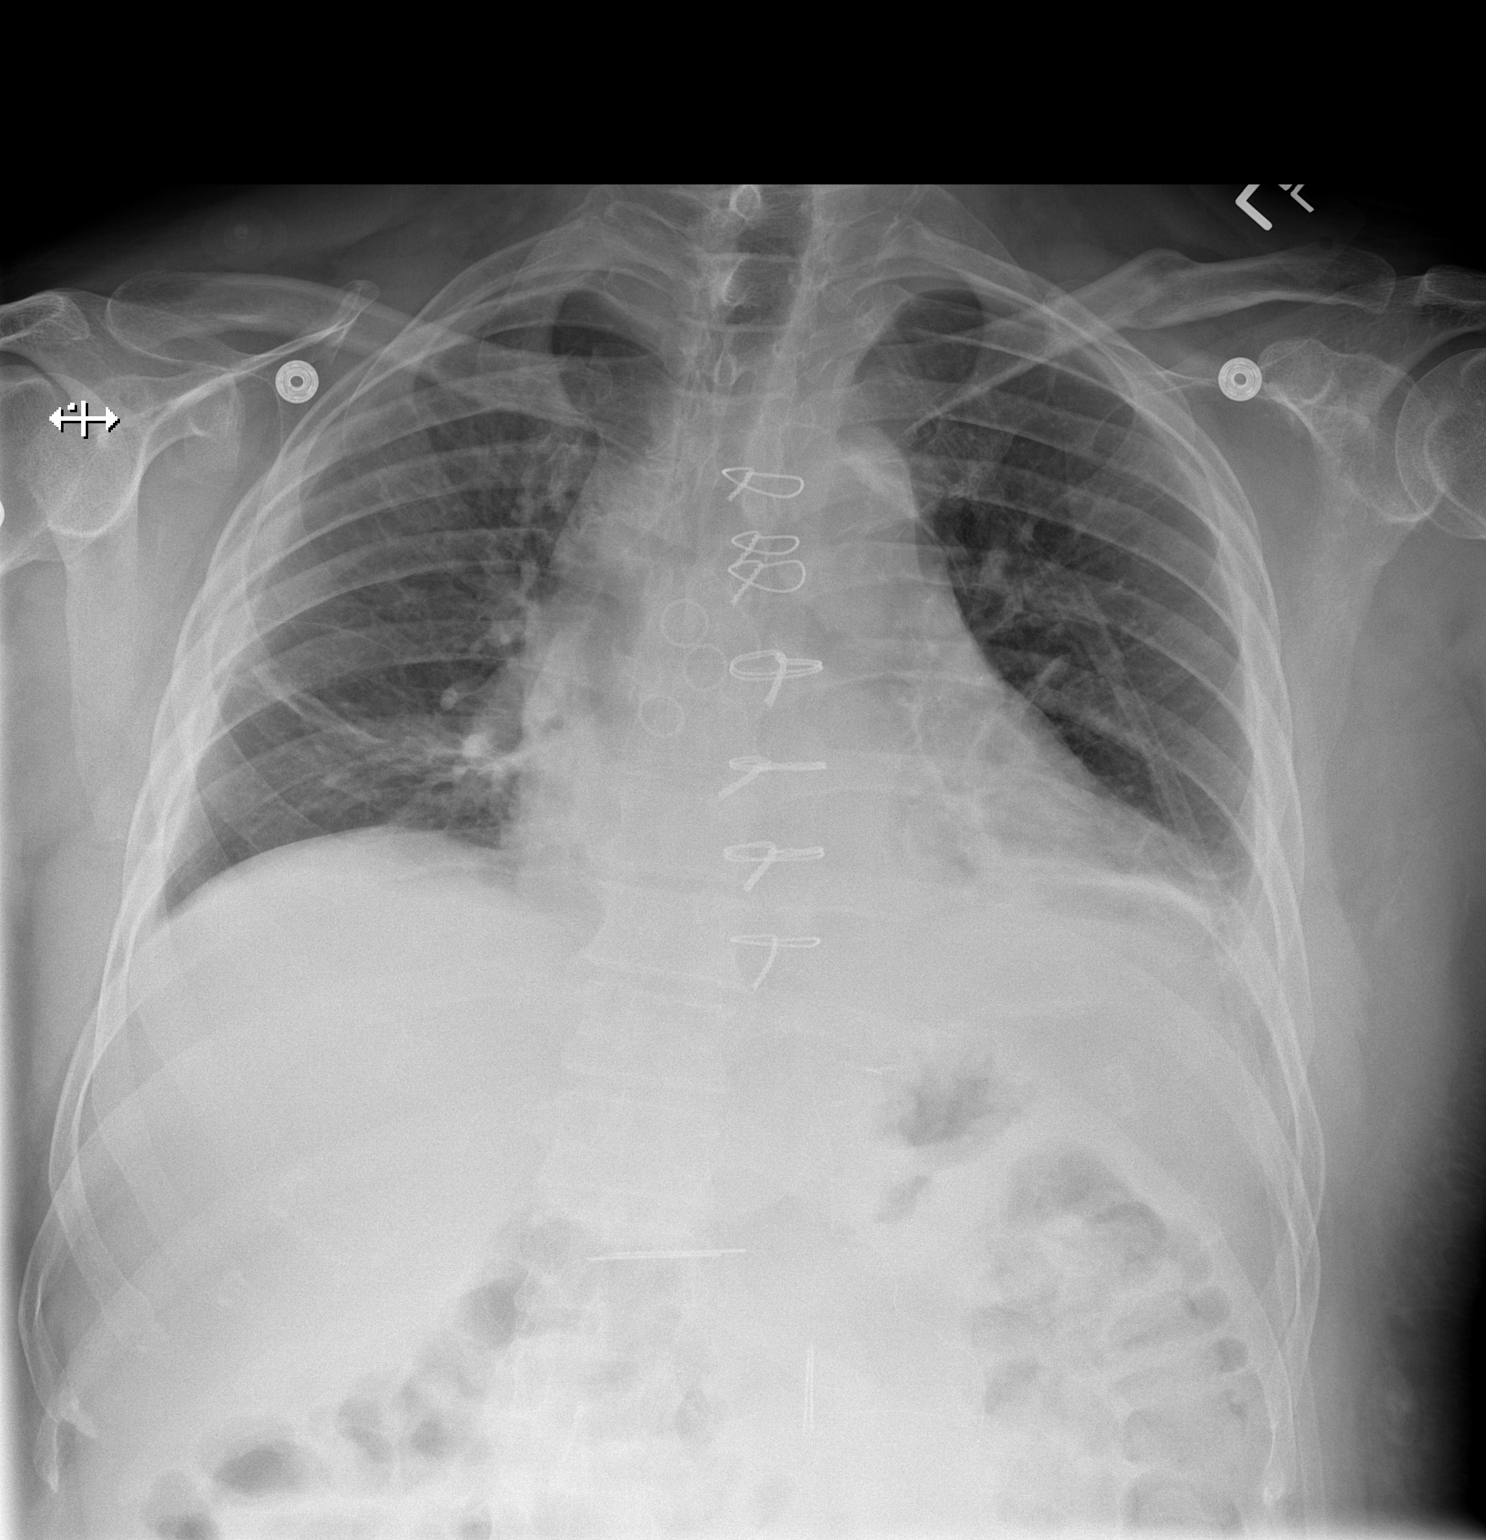

[w chest lat]
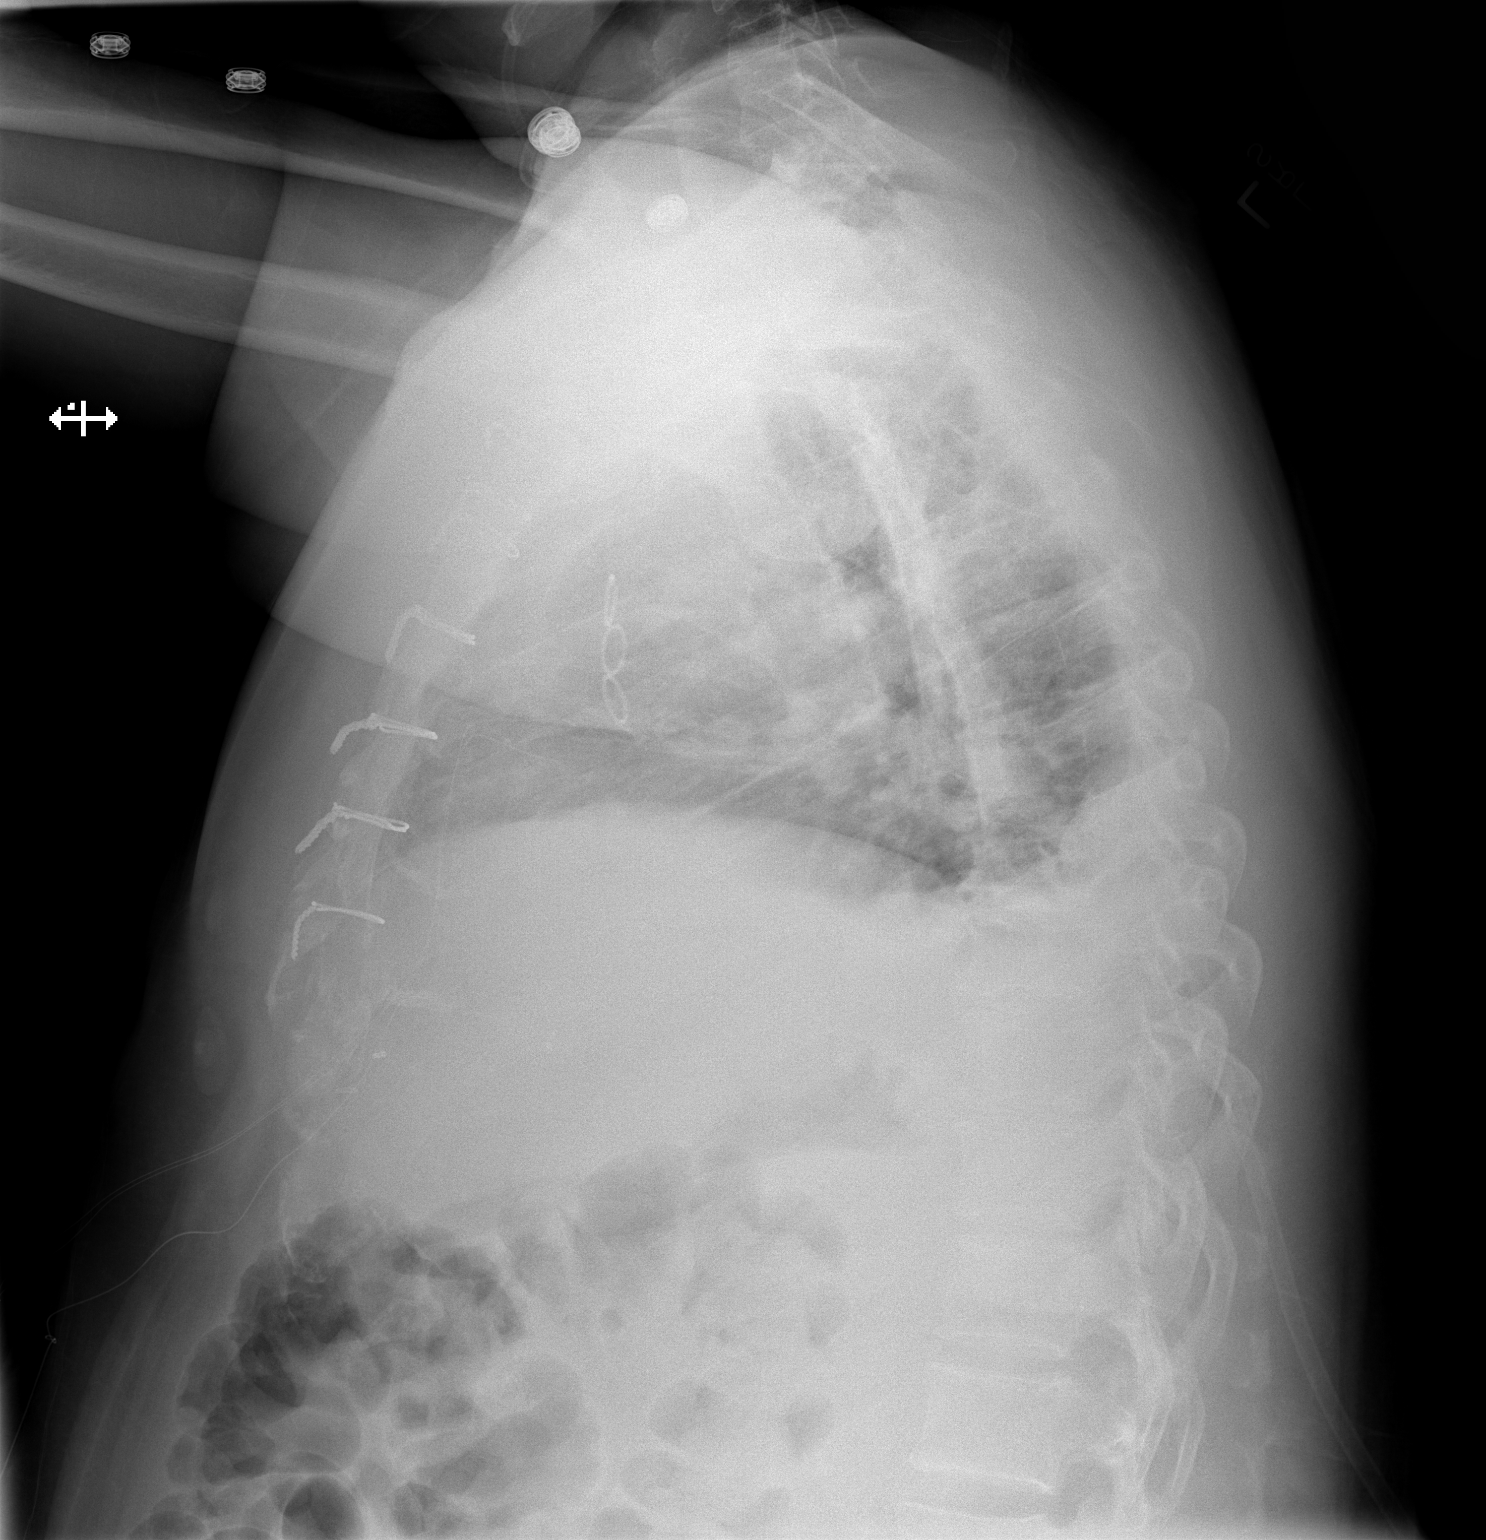

[2 of 2 positions shown; findings below may reference images not displayed]

FINDINGS: Prior CABG.  Cardiomegaly.  Bibasilar atelectasis,
similar prior study.  Small bilateral effusions.  No pneumothorax.
IMPRESSION: Low lung volumes with bibasilar atelectasis.  Small effusions.

## 2013-12-03 DIAGNOSIS — G629 Polyneuropathy, unspecified: Secondary | ICD-10-CM | POA: Insufficient documentation

## 2013-12-10 ENCOUNTER — Telehealth: Payer: Self-pay | Admitting: *Deleted

## 2013-12-10 NOTE — Telephone Encounter (Signed)
Patient walked in with DMV form to be completed by Dr. Debara Pickett. In reviewing the paperwork, it appears another provider had started some of the CV section. Left message that I will have Dr. Debara Pickett review the DMW form and he will be contacted when Dr. Debara Pickett has completed his section

## 2013-12-11 ENCOUNTER — Telehealth: Payer: Self-pay | Admitting: *Deleted

## 2013-12-11 NOTE — Telephone Encounter (Signed)
Notified patient that DMV paperwork (Cardiovascular Section) was signed off by Dr. Debara Pickett and is at front for pick up. Copy of form placed in scan box for records/reference.

## 2014-01-22 ENCOUNTER — Ambulatory Visit (INDEPENDENT_AMBULATORY_CARE_PROVIDER_SITE_OTHER): Payer: Medicare Other | Admitting: Ophthalmology

## 2014-01-22 DIAGNOSIS — E11351 Type 2 diabetes mellitus with proliferative diabetic retinopathy with macular edema: Secondary | ICD-10-CM

## 2014-01-22 DIAGNOSIS — H43813 Vitreous degeneration, bilateral: Secondary | ICD-10-CM

## 2014-01-22 DIAGNOSIS — H35033 Hypertensive retinopathy, bilateral: Secondary | ICD-10-CM

## 2014-01-22 DIAGNOSIS — E11359 Type 2 diabetes mellitus with proliferative diabetic retinopathy without macular edema: Secondary | ICD-10-CM

## 2014-01-22 DIAGNOSIS — H35372 Puckering of macula, left eye: Secondary | ICD-10-CM

## 2014-01-22 DIAGNOSIS — H26491 Other secondary cataract, right eye: Secondary | ICD-10-CM

## 2014-01-22 DIAGNOSIS — E11311 Type 2 diabetes mellitus with unspecified diabetic retinopathy with macular edema: Secondary | ICD-10-CM

## 2014-01-22 DIAGNOSIS — I10 Essential (primary) hypertension: Secondary | ICD-10-CM

## 2014-02-13 ENCOUNTER — Encounter: Payer: Self-pay | Admitting: Gastroenterology

## 2014-02-13 ENCOUNTER — Telehealth: Payer: Self-pay | Admitting: Internal Medicine

## 2014-02-13 NOTE — Telephone Encounter (Signed)
Patient is scheduled for 02/17/14 1:45.  He is aware of the appt date and time

## 2014-02-16 ENCOUNTER — Ambulatory Visit (INDEPENDENT_AMBULATORY_CARE_PROVIDER_SITE_OTHER): Payer: Medicare Other | Admitting: Ophthalmology

## 2014-02-16 DIAGNOSIS — H2701 Aphakia, right eye: Secondary | ICD-10-CM

## 2014-02-17 ENCOUNTER — Ambulatory Visit (INDEPENDENT_AMBULATORY_CARE_PROVIDER_SITE_OTHER): Payer: Medicare Other | Admitting: Internal Medicine

## 2014-02-17 ENCOUNTER — Encounter: Payer: Self-pay | Admitting: Internal Medicine

## 2014-02-17 VITALS — BP 146/80 | HR 64 | Ht 67.0 in | Wt 207.6 lb

## 2014-02-17 DIAGNOSIS — E1143 Type 2 diabetes mellitus with diabetic autonomic (poly)neuropathy: Secondary | ICD-10-CM

## 2014-02-17 DIAGNOSIS — K219 Gastro-esophageal reflux disease without esophagitis: Secondary | ICD-10-CM

## 2014-02-17 DIAGNOSIS — K3184 Gastroparesis: Principal | ICD-10-CM | POA: Insufficient documentation

## 2014-02-17 MED ORDER — METOCLOPRAMIDE HCL 10 MG PO TABS: 10.0000 mg | ORAL_TABLET | Freq: Three times a day (TID) | ORAL | Status: DC

## 2014-02-17 NOTE — Patient Instructions (Addendum)
Please pick up from your pharmacy: [facility]  Will schedule an appointment for you in April 16, 2014 at 11:00am.  I appreciate the opportunity to care for you.

## 2014-02-17 NOTE — Progress Notes (Signed)
Subjective:    Patient ID: Joseph [name], male    DOB: Sep 14, 1955, 58 y.o.   MRN: [id number]  HPI The patient is a middle-aged Hispanic man who has a history of gastroparesis and also long history of constipation, bloating and abdominal discomfort. I had seen him use ago and my partners have seen him also. He is at least 1 if not 2 upper endoscopy procedures that have been unrevealing, last was probably in 2005. In the past he was treated with metoclopramide and Zelnorm. He is a poor historian and cannot really remember details. He is here alone. He has been complaining of increasing heartburn and reflux and abdominal bloating. He will vomit after meals at times. He saw his primary care provider in November, earlier this month and describes similar symptoms. This information was sent to me and is also available in care everywhere. Medications, allergies, past medical history, past surgical history, family history and social history are reviewed and updated in the EMR.  Review of Systems Positive for allergies and sinus problems all other review of systems negative or as per history of present illness    Objective:   Physical Exam General:  Well-developed, well-nourished and in no acute distress Eyes:  anicteric. Neck:   supple w/o thyromegaly or mass.  Lungs: Clear to auscultation bilaterally. Heart:  S1S2, no rubs, murmurs, gallops. Abdomen:  soft, non-tender, no hepatosplenomegaly, hernia, or mass and BS+. There is no succussion splash or no bruits Extremities:   no edema Skin   no rash. Neuro:  A&O x 3.  Psych:  appropriate mood and  Affect.   Data Reviewed: Hemoglobin A1c was 6.4% on November 5. Creatinine 1.77 BUN 29. Microalbumin 50 Hemoglobin 13.4 with MCV 88, the anemia is chronic and mild platelets are normal on RDW is normal or slightly high    Assessment & Plan:   1. Diabetic gastroparesis   2. Gastroesophageal reflux disease without esophagitis    1. This is most  consistent with his chronic recurrent diabetic gastroparesis. Will restart metoclopramide 10 mg before meals. He is tolerated this in the past. It is helped him. 2. He will see him again in about 2 months. As long as he is improved would proceed with a screening colonoscopy. If he is not E may need an endoscopy and a colonoscopy. 3. Further plans pending clinical course.   Current outpatient prescriptions: aspirin 81 MG tablet, Take 81 mg by mouth daily., Disp: , Rfl: ;  cholecalciferol (VITAMIN D) 1000 UNITS tablet, Take 1,000 Units by mouth daily., Disp: , Rfl: ;  Clotrimazole-Betamethasone (LOTRISONE EX), Apply topically as needed., Disp: , Rfl: ;  fenofibrate 160 MG tablet, Take 160 mg by mouth daily., Disp: , Rfl: ;  ferrous sulfate 325 (65 FE) MG tablet, Take 325 mg by mouth 2 (two) times daily., Disp: , Rfl:  furosemide (LASIX) 20 MG tablet, TAKE 1 TABLET (20 MG TOTAL) BY MOUTH DAILY., Disp: 30 tablet, Rfl: 5;  gabapentin (NEURONTIN) 300 MG capsule, Take 300 mg by mouth 3 (three) times daily., Disp: , Rfl: ;  insulin aspart (NOVOLOG) 100 UNIT/ML injection, Inject 10-15 Units into the skin 3 (three) times daily before meals. Per sliding scale, Disp: , Rfl:  insulin glargine (LANTUS) 100 UNIT/ML injection, Inject 20 Units into the skin 2 (two) times daily., Disp: , Rfl: ;  lisinopril (PRINIVIL,ZESTRIL) 20 MG tablet, Take 1 tablet (20 mg total) by mouth daily., Disp: 90 tablet, Rfl: 2;  metoprolol tartrate (LOPRESSOR) 25  MG tablet, TAKE 1 TABLET (25 MG TOTAL) BY MOUTH 2 (TWO) TIMES DAILY., Disp: 60 tablet, Rfl: 10;  omeprazole (PRILOSEC) 20 MG capsule, Take 40 mg by mouth daily. , Disp: , Rfl:  pravastatin (PRAVACHOL) 40 MG tablet, Take 1 tablet (40 mg total) by mouth daily., Disp: 90 tablet, Rfl: 2;  sertraline (ZOLOFT) 100 MG tablet, Take 150 mg by mouth daily., Disp: , Rfl: ;  Tamsulosin HCl (FLOMAX) 0.4 MG CAPS, Take 0.4 mg by mouth daily after supper., Disp: , Rfl: ;  metoCLOPramide (REGLAN) 10 MG  tablet, Take 1 tablet (10 mg total) by mouth 3 (three) times daily before meals., Disp: 90 tablet, Rfl: 2  I appreciate the opportunity to care for this patient. I will send a copy to Reita Cliche FNP

## 2014-02-25 ENCOUNTER — Ambulatory Visit (INDEPENDENT_AMBULATORY_CARE_PROVIDER_SITE_OTHER): Payer: Medicare Other | Admitting: Internal Medicine

## 2014-02-25 ENCOUNTER — Encounter: Payer: Self-pay | Admitting: Internal Medicine

## 2014-02-25 VITALS — BP 130/78 | HR 60 | Ht 67.0 in | Wt 209.5 lb

## 2014-02-25 DIAGNOSIS — N189 Chronic kidney disease, unspecified: Secondary | ICD-10-CM

## 2014-02-25 DIAGNOSIS — I10 Essential (primary) hypertension: Secondary | ICD-10-CM

## 2014-02-25 DIAGNOSIS — E785 Hyperlipidemia, unspecified: Secondary | ICD-10-CM | POA: Insufficient documentation

## 2014-02-25 DIAGNOSIS — Z951 Presence of aortocoronary bypass graft: Secondary | ICD-10-CM

## 2014-02-25 DIAGNOSIS — E1059 Type 1 diabetes mellitus with other circulatory complications: Secondary | ICD-10-CM

## 2014-02-25 NOTE — Patient Instructions (Signed)
Your physician wants you to follow-up in: 1 Year You will receive a reminder letter in the mail two months in advance. If you don't receive a letter, please call our office to schedule the follow-up appointment.  Your physician recommends that you return for lab work Fasting NMR with Lipids

## 2014-02-25 NOTE — Progress Notes (Signed)
OFFICE NOTE  Chief Complaint:  Routine followup  Primary Care Physician: Florina Ou, MD  HPI:  ISON WICHMANN is a 58 year old gentleman who recently underwent a MET test with a low VO2. I was concerned about multi-vessel coronary disease and then performed cardiac catheterization on him on January 30, 2012. This demonstrated proximal to mid LAD disease and disease of 2 large OM vessels concerning for surgical disease. I talked with Servando Snare and he recommended bypass surgery. He did undergo bypass surgery on February 06, 2012 with a 5-vessel bypass, a LIMA to the LAD, a saphenous vein to the diagonal, a saphenous vein to the distal OM, and sequential reverse saphenous vein graft to the posterior descending and 2nd OM. He did well with this, although it was complicated with some postoperative nausea. He has had some shortness of breath, weakness and slow recovery. He has also had some tenderness in the chest wall around the area of the sternum and incision which he was told by Dr. Servando Snare could take some time to improve. We have also made some adjustments in his medicines. He is undergoing home occupational therapy by Arville Go and they are recommending more upper extremity work which I agree with.  Today he continues to complain about tightness across the chest wall. The midline incision does demonstrate a keloid scar, and this may be responsible for his feeling of tightness. It potentially is possible that the sternum was too closely approximated, however Dr. Servando Snare did not indicate the event this was a problem. Overall Mr. Esty is doing well without complaints of shortness of breath, chest pain, palpitations, or syncope. Does report some mild lower stray swelling, but his weight has generally been the same.  He is followed by Dr. Moshe Cipro for chronic kidney disease. Finally he reports has been having some problems with what sounds like tinea cruris.  He is currently taking a topical  steroid and prednisone.  Mr. Kenna returns today and is without complaints. Denies shortness of breath or chest pain. He reports his kidneys have been stable. He has managed to lose a small amount of weight. He says that his diabetes is well controlled. He has not had a cholesterol test in over a year.  PMHx:  Past Medical History  Diagnosis Date  . Hypertension   . Depression   . Chronic kidney disease     " My kidneys are not functioning right "  . GERD (gastroesophageal reflux disease)   . Anemia   . Coronary artery disease     Dr Debara Pickett @ [facility] cardiology [place]  . Sleep apnea 2010    refuses to use cpap  . Diabetes mellitus without complication 4967    insulin dependent  . Chronic renal insufficiency, stage III (moderate)     [place] [facility]  . Dyslipidemia   . PVD (peripheral vascular disease)   . BPH (benign prostatic hypertrophy)   . Neuropathy   . Gastroparesis due to DM   . Arthritis     Past Surgical History  Procedure Laterality Date  . Ankle fracture surgery    . Coronary artery bypass graft  02/06/2012    Procedure: CORONARY ARTERY BYPASS GRAFTING (CABG); 5- Vessel bypass a LIMA to the LAD, a saphenous vein to the to the diagonal , a saphenous vein to the distal OM, and sequential reverse saphenous vien graft to the posterior descending and 2nd OM. Surgeon: Grace Isaac, MD;  Location: [place];  Service: Open Heart Surgery;  Laterality:  [name];  . Cardiac catheterization  01/30/2012    This demonstrated proximal to mid LAD disease and disease of 2 large OM vessels concerning for surgical disease.  . Esophagogastroduodenoscopy  2005    FAMHx:  History reviewed. No pertinent family history.  SOCHx:   reports that he has never smoked. He has never used smokeless tobacco. He reports that he does not drink alcohol or use illicit drugs.  ALLERGIES:  No Known Allergies  ROS: A comprehensive review of systems was negative.  HOME MEDS: Current  Outpatient Prescriptions  Medication Sig Dispense Refill  . aspirin 81 MG tablet Take 81 mg by mouth daily.    . cholecalciferol (VITAMIN D) 1000 UNITS tablet Take 1,000 Units by mouth daily.    . Clotrimazole-Betamethasone (LOTRISONE EX) Apply topically as needed.    . fenofibrate 160 MG tablet Take 160 mg by mouth daily.    . ferrous sulfate 325 (65 FE) MG tablet Take 325 mg by mouth 2 (two) times daily.    . furosemide (LASIX) 20 MG tablet TAKE 1 TABLET (20 MG TOTAL) BY MOUTH DAILY. 30 tablet 5  . insulin aspart (NOVOLOG) 100 UNIT/ML injection Inject 10-15 Units into the skin 3 (three) times daily before meals. Per sliding scale    . insulin glargine (LANTUS) 100 UNIT/ML injection Inject 20 Units into the skin 2 (two) times daily.    Marland Kitchen lisinopril (PRINIVIL,ZESTRIL) 20 MG tablet Take 1 tablet (20 mg total) by mouth daily. 90 tablet 2  . metoCLOPramide (REGLAN) 10 MG tablet Take 1 tablet (10 mg total) by mouth 3 (three) times daily before meals. 90 tablet 2  . metoprolol tartrate (LOPRESSOR) 25 MG tablet TAKE 1 TABLET (25 MG TOTAL) BY MOUTH 2 (TWO) TIMES DAILY. 60 tablet 10  . omeprazole (PRILOSEC) 20 MG capsule Take 40 mg by mouth daily.     . pravastatin (PRAVACHOL) 40 MG tablet Take 1 tablet (40 mg total) by mouth daily. 90 tablet 2  . sertraline (ZOLOFT) 100 MG tablet Take 150 mg by mouth daily.    . Tamsulosin HCl (FLOMAX) 0.4 MG CAPS Take 0.4 mg by mouth daily after supper.     No current facility-administered medications for this visit.    LABS/IMAGING: No results found for this or any previous visit (from the past 48 hour(s)). No results found.  VITALS: BP 130/78 mmHg  Pulse 60  Ht _0  (1.702 m)  Wt 209 lb 8 oz (95.029 kg)  BMI 32.80 kg/m2  EXAM: General appearance: alert and no distress Neck: no adenopathy, no carotid bruit, no JVD, supple, symmetrical, trachea midline and thyroid not enlarged, symmetric, no tenderness/mass/nodules Lungs: clear to auscultation  bilaterally Heart: regular rate and rhythm, S1, S2 normal, no murmur, click, rub or gallop Abdomen: soft, non-tender; bowel sounds normal; no masses,  no organomegaly Extremities: extremities normal, atraumatic, no cyanosis or edema Pulses: 2+ and symmetric Skin: Hypertrophic scar of the midline sternotomy incision Neurologic: Grossly normal  EKG: Sinus rhythm at 60  ASSESSMENT: 1. Coronary disease status post 5 vessel bypass in November 2013, with LIMA to LAD, SVG to diagonal, SVG to OM, SVG to posterior descending and SVG to the second OM 2. Insulin-dependent diabetes 3. Dyslipidemia 4. Hypertension 5. Obesity 6. Chronic kidney disease stage II  PLAN: 1.   Overall Mr. Millirons is doing very well. He is not complaining of any new chest pain symptoms. He does not feel it is keloid scars bothering him too much. He reports his  diabetes is well controlled. He has as good control versus hypertension. He is due for recheck of his cholesterol. We will send him for that today. Plan to see him back annually or sooner as necessary.  Pixie Casino, MD, [facility] Attending Cardiologist [facility] 02/25/2014, 1:36 PM

## 2014-03-04 ENCOUNTER — Telehealth: Payer: Self-pay | Admitting: *Deleted

## 2014-03-04 NOTE — Telephone Encounter (Signed)
Patient walked into the office.  Patient needs a copy of his lab request from 02/25/14- NMR with lipids Copy reprinted by RN and given to patient by front desk.

## 2014-03-05 ENCOUNTER — Encounter (HOSPITAL_COMMUNITY): Payer: Self-pay | Admitting: Internal Medicine

## 2014-03-06 LAB — NMR LIPOPROFILE WITH LIPIDS
CHOLESTEROL, TOTAL: 96 mg/dL — AB (ref 100–199)
HDL Particle Number: 33.5 umol/L (ref >=30.5)
HDL SIZE: 9.1 nm — AB (ref >=9.2)
HDL-C: 37 mg/dL — AB (ref >39)
LDL (calc): 38 mg/dL (ref 0–99)
LDL PARTICLE NUMBER: 662 nmol/L (ref <1000)
LDL Size: 20.5 nm (ref >=20.8)
LP-IR SCORE: 52 — AB (ref <=45)
Large HDL-P: 2.6 umol/L — ABNORMAL LOW (ref >=4.8)
Large VLDL-P: 1.7 nmol/L (ref <=2.7)
Small LDL Particle Number: 385 nmol/L (ref <=527)
TRIGLYCERIDES: 106 mg/dL (ref 0–149)
VLDL Size: 50.7 nm — ABNORMAL HIGH (ref <=46.6)

## 2014-03-11 ENCOUNTER — Encounter: Payer: Self-pay | Admitting: *Deleted

## 2014-04-04 ENCOUNTER — Other Ambulatory Visit: Payer: Self-pay | Admitting: Internal Medicine

## 2014-04-06 NOTE — Telephone Encounter (Signed)
Rx refill sent to patient pharmacy   

## 2014-04-16 ENCOUNTER — Encounter: Payer: Self-pay | Admitting: Internal Medicine

## 2014-04-16 ENCOUNTER — Ambulatory Visit (INDEPENDENT_AMBULATORY_CARE_PROVIDER_SITE_OTHER): Payer: Medicare Other | Admitting: Internal Medicine

## 2014-04-16 VITALS — BP 138/70 | HR 76 | Ht 67.0 in | Wt 213.4 lb

## 2014-04-16 DIAGNOSIS — E1143 Type 2 diabetes mellitus with diabetic autonomic (poly)neuropathy: Secondary | ICD-10-CM

## 2014-04-16 DIAGNOSIS — Z1211 Encounter for screening for malignant neoplasm of colon: Secondary | ICD-10-CM

## 2014-04-16 DIAGNOSIS — K3184 Gastroparesis: Principal | ICD-10-CM

## 2014-04-16 NOTE — Progress Notes (Signed)
Subjective:    Patient ID: Joseph [name], male    DOB: 10/20/1955, 59 y.o.   MRN: [id number]  HPI   59 y/o presents for follow up of gastroparesis.  He was restarted 2 months ago on Metoclopramide 10 mg which has helped.  He still complains of intermittent nausea and bloating but has seen improvement since starting the medication.  He is having normal bowel movements and denies abdominal discomfort, bleeding, vomiting.  No complaints of abnormal facial movements.  No Known Allergies Outpatient Prescriptions Prior to Visit  Medication Sig Dispense Refill  . aspirin 81 MG tablet Take 81 mg by mouth daily.    . cholecalciferol (VITAMIN D) 1000 UNITS tablet Take 1,000 Units by mouth daily.    . Clotrimazole-Betamethasone (LOTRISONE EX) Apply topically as needed.    . fenofibrate 160 MG tablet Take 160 mg by mouth daily.    . ferrous sulfate 325 (65 FE) MG tablet Take 325 mg by mouth 2 (two) times daily.    . furosemide (LASIX) 20 MG tablet TAKE 1 TABLET (20 MG TOTAL) BY MOUTH DAILY. 30 tablet 4  . insulin aspart (NOVOLOG) 100 UNIT/ML injection Inject 10-15 Units into the skin 3 (three) times daily before meals. Per sliding scale    . insulin glargine (LANTUS) 100 UNIT/ML injection Inject 20 Units into the skin 2 (two) times daily.    Marland Kitchen lisinopril (PRINIVIL,ZESTRIL) 20 MG tablet Take 1 tablet (20 mg total) by mouth daily. 90 tablet 2  . metoCLOPramide (REGLAN) 10 MG tablet Take 1 tablet (10 mg total) by mouth 3 (three) times daily before meals. 90 tablet 2  . metoprolol tartrate (LOPRESSOR) 25 MG tablet TAKE 1 TABLET (25 MG TOTAL) BY MOUTH 2 (TWO) TIMES DAILY. 60 tablet 9  . omeprazole (PRILOSEC) 20 MG capsule Take 40 mg by mouth daily.     . pravastatin (PRAVACHOL) 40 MG tablet Take 1 tablet (40 mg total) by mouth daily. 90 tablet 2  . sertraline (ZOLOFT) 100 MG tablet Take 150 mg by mouth daily.    . Tamsulosin HCl (FLOMAX) 0.4 MG CAPS Take 0.4 mg by mouth daily after supper.     No  facility-administered medications prior to visit.   Past Medical History  Diagnosis Date  . Hypertension   . Depression   . Chronic kidney disease     " My kidneys are not functioning right "  . GERD (gastroesophageal reflux disease)   . Anemia   . Coronary artery disease     Dr Debara Pickett @ [facility] cardiology [place]  . Sleep apnea 2010    refuses to use cpap  . 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 Chronic renal insufficiency, stage III (moderate)     [place] kidney  . Dyslipidemia   . PVD (peripheral vascular disease)   . BPH (benign prostatic hypertrophy)   . Neuropathy   . Gastroparesis due to DM   . Arthritis    Past Surgical History  Procedure Laterality Date  . Ankle fracture surgery    . Coronary artery bypass graft  02/06/2012    Procedure: CORONARY ARTERY BYPASS GRAFTING (CABG); 5- Vessel bypass a LIMA to the LAD, a saphenous vein to the to the diagonal , a saphenous vein to the distal OM, and sequential reverse saphenous vien graft to the posterior descending and 2nd OM. Surgeon: Grace Isaac, MD;  Location: [place];  Service: Open Heart Surgery;  Laterality: [name];  .  Cardiac catheterization  01/30/2012    This demonstrated proximal to mid LAD disease and disease of 2 large OM vessels concerning for surgical disease.  . Esophagogastroduodenoscopy  2005  . Left heart catheterization with coronary angiogram [name] 01/30/2012    Procedure: LEFT HEART CATHETERIZATION WITH CORONARY ANGIOGRAM;  Surgeon: Pixie Casino, MD;  [facility] C[facility] LAB;  Service: Cardiovascular;  Laterality: [name];   History   Social History  . Marital Status: Widowed    Spouse Name: [name]    Number of Children: [name]  . Years of Education: [name]   Social History Main Topics  . Smoking status: Never Smoker   . Smokeless tobacco: Never Used  . Alcohol Use: No  . Drug Use: No  . Sexual Activity: None   Other Topics Concern  . None   Social History Narrative     Widowed, 1 son and 4 daughters   Disabled and dual eligible Medicare and Medicaid   One caffeinated beverage daily      History reviewed. No pertinent family history.     Review of Systems  HENT: Negative for trouble swallowing.   Respiratory: Negative for chest tightness.   Gastrointestinal: Positive for nausea and abdominal distention. Negative for abdominal pain, diarrhea, constipation and blood in stool.  Neurological: Negative for light-headedness and headaches.  Psychiatric/Behavioral: Negative for behavioral problems, confusion and agitation.       Objective:   Physical Exam  General: Well developed, well nourished, appears no in apparent distress. HEENT: Anicteic Sclera. No pharyngeal erythema or exudates  Neck: Supple, no JVD, no masses  Cardiovascular: RRR, S1 S2 auscultated, no rubs, murmurs or gallops.  Respiratory: Clear to auscultation bilaterally with equal chest rise  Abdomen: No tenderness to palpation, + BS, no masses.; No guarding or peritoneal signs Extremities: warm dry without cyanosis clubbing. Trace LE edema Neuro: AAOx3, cranial nerves grossly intact.  Skin: Without rashes exudates or nodules.  Psych: Normal affect and demeanor with intact judgement and insight      Assessment & Plan:   Diabetic gastroparesis -Improved on metaclopramide 10mg .  Patient will continue the medication. -No side effects seen.  Discussed possible tardive dyskinesia  -Refill x 6 - Colon cancer screening  -Scheduled for the next upcoming months. Colonoscopy. The risks and benefits as well as alternatives of endoscopic procedure(s) have been discussed and reviewed. All questions answered. The patient agrees to proceed.   [name] 04/16/2014  I have personally seen the patient, reviewed and repeated key elements of the history and physical and participated in formation of the assessment and plan the student has documented.  Gatha Mayer, MD, [facility] [id number], Lynelle Smoke, MD

## 2014-04-16 NOTE — Patient Instructions (Addendum)
You have been scheduled for a colonoscopy. Please follow written instructions given to you at your visit today.  Please pick up your prep supplies at the pharmacy. If you use inhalers (even only as needed), please bring them with you on the day of your procedure.   Hold your Novolog evening dose day prior to your colonoscopy and Hold  the day of the colonoscopy.  Take 1/2 your evening dose of Lantus the day prior to your colonoscopy and hold lantus day of procedure.    I appreciate the opportunity to care for you.

## 2014-04-16 NOTE — Assessment & Plan Note (Signed)
Improved on metaclopramide No side effects seen Discussed possible tardive dyskinesia  Refill x 6

## 2014-04-20 ENCOUNTER — Other Ambulatory Visit: Payer: Self-pay | Admitting: Internal Medicine

## 2014-04-20 NOTE — Telephone Encounter (Signed)
Rx(s) sent to pharmacy electronically.  

## 2014-05-09 ENCOUNTER — Other Ambulatory Visit: Payer: Self-pay | Admitting: Internal Medicine

## 2014-06-05 ENCOUNTER — Ambulatory Visit (AMBULATORY_SURGERY_CENTER): Payer: Medicare Other | Admitting: Internal Medicine

## 2014-06-05 ENCOUNTER — Encounter: Payer: Self-pay | Admitting: Internal Medicine

## 2014-06-05 VITALS — BP 133/75 | HR 63 | Temp 98.9°F | Resp 20 | Ht 67.0 in | Wt 213.0 lb

## 2014-06-05 DIAGNOSIS — K635 Polyp of colon: Secondary | ICD-10-CM

## 2014-06-05 DIAGNOSIS — D122 Benign neoplasm of ascending colon: Secondary | ICD-10-CM

## 2014-06-05 DIAGNOSIS — D120 Benign neoplasm of cecum: Secondary | ICD-10-CM | POA: Diagnosis not present

## 2014-06-05 DIAGNOSIS — Z1211 Encounter for screening for malignant neoplasm of colon: Secondary | ICD-10-CM

## 2014-06-05 DIAGNOSIS — D123 Benign neoplasm of transverse colon: Secondary | ICD-10-CM | POA: Diagnosis not present

## 2014-06-05 LAB — GLUCOSE, CAPILLARY
GLUCOSE-CAPILLARY: 225 mg/dL — AB (ref 70–99)
Glucose-Capillary: 172 mg/dL — ABNORMAL HIGH (ref 70–99)

## 2014-06-05 MED ORDER — SODIUM CHLORIDE 0.9 % IV SOLN: 500.0000 mL | INTRAVENOUS | Status: DC

## 2014-06-05 NOTE — Op Note (Signed)
[place]  [address] [place], [address]   COLONOSCOPY PROCEDURE REPORT  PATIENT: Joseph, [name]  MR#: [id number] BIRTHDATE: 06/11/1955 , 89  yrs. old GENDER: male ENDOSCOPIST: Gatha Mayer, MD, [facility] PROCEDURE DATE:  06/05/2014 PROCEDURE:   Colonoscopy, screening, Colonoscopy with biopsy, and Colonoscopy with snare polypectomy First Screening Colonoscopy - Avg.  risk and is 50 yrs.  old or older Yes.  Prior Negative Screening - Now for repeat screening. [name]  History of Adenoma - Now for follow-up colonoscopy & has been > or = to 3 yrs.  [name] ASA CLASS:   Class III INDICATIONS:Screening for colonic neoplasia and Colorectal Neoplasm Risk Assessment for this procedure is average risk. MEDICATIONS: Propofol 200 mg IV and Lidocaine 200 mg IV  DESCRIPTION OF PROCEDURE:   After the risks benefits and alternatives of the procedure were thoroughly explained, informed consent was obtained.  The digital rectal exam revealed no abnormalities of the rectum, revealed the prostate was not enlarged, and revealed no prostatic nodules.   The LB [id number]  endoscope was introduced through the anus and advanced to the cecum, which was identified by both the appendix and ileocecal valve. No adverse events experienced.   The quality of the prep was good.  (MiraLax was used)  The instrument was then slowly withdrawn as the colon was fully examined.      COLON FINDINGS: Three sessile polyps ranging from 2 to 63mm in size were found in the transverse colon, ascending colon, and at the cecum.  Polypectomies were performed with a cold snare (5 mm polyp) and with cold forceps.  The resection was complete, the polyp tissue was completely retrieved and sent to histology.   The examination was otherwise normal.  Retroflexed views revealed no abnormalities. The time to cecum = 2.0 Withdrawal time = 10.1   The scope was withdrawn and the procedure completed. COMPLICATIONS:  There were no immediate complications.  ENDOSCOPIC IMPRESSION: Three polyps were found and removed from the transverse colon, ascending colon, and at the cecum Otherwise normal colonoscopy with good prep - first colonoscopy  RECOMMENDATIONS: Timing of repeat colonoscopy will be determined by pathology findings.  eSigned:  Gatha Mayer, MD, [facility] 06/05/2014 4:28 PM   cc: The Patient and Florina Ou, MD

## 2014-06-05 NOTE — Progress Notes (Signed)
Called to room to assist during endoscopic procedure.  Patient ID and intended procedure confirmed with present staff. Received instructions for my participation in the procedure from the performing physician.  

## 2014-06-05 NOTE — Patient Instructions (Addendum)
I found and removed 3 small polyps that look benign.  You also have a condition called diverticulosis - common and not usually a problem. Please read the handout provided.  I will let you know pathology results and when to have another routine colonoscopy by mail.  I appreciate the opportunity to care for you. Gatha Mayer, MD, FACG  YOU HAD AN ENDOSCOPIC PROCEDURE TODAY AT [facility]:   Refer to the procedure report that was given to you for any specific questions about what was found during the examination.  If the procedure report does not answer your questions, please call your gastroenterologist to clarify.  If you requested that your care partner not be given the details of your procedure findings, then the procedure report has been included in a sealed envelope for you to review at your convenience later.  YOU SHOULD EXPECT: Some feelings of bloating in the abdomen. Passage of more gas than usual.  Walking can help get rid of the air that was put into your GI tract during the procedure and reduce the bloating. If you had a lower endoscopy (such as a colonoscopy or flexible sigmoidoscopy) you may notice spotting of blood in your stool or on the toilet paper. If you underwent a bowel prep for your procedure, you may not have a normal bowel movement for a few days.  Please Note:  You might notice some irritation and congestion in your nose or some drainage.  This is from the oxygen used during your procedure.  There is no need for concern and it should clear up in a day or so.  SYMPTOMS TO REPORT IMMEDIATELY:   Following lower endoscopy (colonoscopy or flexible sigmoidoscopy):  Excessive amounts of blood in the stool  Significant tenderness or worsening of abdominal pains  Swelling of the abdomen that is new, acute  Fever of 100F or higher    For urgent or emergent issues, a gastroenterologist can be reached at any hour by calling [phone]  [phone].   DIET: Your first meal following the procedure should be a small meal and then it is ok to progress to your normal diet. Heavy or fried foods are harder to digest and may make you feel nauseous or bloated.  Likewise, meals heavy in dairy and vegetables can increase bloating.  Drink plenty of fluids but you should avoid alcoholic beverages for 24 hours.  ACTIVITY:  You should plan to take it easy for the rest of today and you should NOT DRIVE or use heavy machinery until tomorrow (because of the sedation medicines used during the test).    FOLLOW UP: Our staff will call the number listed on your records the next business day following your procedure to check on you and address any questions or concerns that you may have regarding the information given to you following your procedure. If we do not reach you, we will leave a message.  However, if you are feeling well and you are not experiencing any problems, there is no need to return our call.  We will assume that you have returned to your regular daily activities without incident.  If any biopsies were taken you will be contacted by phone or by letter within the next 1-3 weeks.  Please call us at [phone] if you have not heard about the biopsies in 3 weeks.    SIGNATURES/CONFIDENTIALITY: You and/or your care partner have signed paperwork which will be entered into your electronic medical record.  These signatures attest to the fact that that the information above on your After Visit Summary has been reviewed and is understood.  Full responsibility of the confidentiality of this discharge information lies with you and/or your care-partner.  Polyp and diverticulosis information given.

## 2014-06-05 NOTE — Progress Notes (Signed)
Report to PACU, RN, vss, BBS= Clear.  

## 2014-06-08 ENCOUNTER — Telehealth: Payer: Self-pay

## 2014-06-08 NOTE — Telephone Encounter (Signed)
No answer, left message

## 2014-06-11 ENCOUNTER — Encounter: Payer: Self-pay | Admitting: Internal Medicine

## 2014-06-11 DIAGNOSIS — Z860101 Personal history of adenomatous and serrated colon polyps: Secondary | ICD-10-CM

## 2014-06-11 DIAGNOSIS — Z86010 Personal history of colonic polyps: Secondary | ICD-10-CM

## 2014-06-11 HISTORY — DX: Personal history of adenomatous and serrated colon polyps: Z86.0101

## 2014-06-11 HISTORY — DX: Personal history of colonic polyps: Z86.010

## 2014-06-11 NOTE — Progress Notes (Signed)
Quick Note:  Two adenomas max 5 mm - repeat colonoscopy 2021 ______

## 2014-07-07 ENCOUNTER — Other Ambulatory Visit: Payer: Self-pay | Admitting: Internal Medicine

## 2014-07-07 NOTE — Telephone Encounter (Signed)
Refill x4 

## 2014-07-07 NOTE — Telephone Encounter (Signed)
May I refill Sir? 

## 2014-08-17 ENCOUNTER — Ambulatory Visit (INDEPENDENT_AMBULATORY_CARE_PROVIDER_SITE_OTHER): Payer: Medicare Other | Admitting: Ophthalmology

## 2014-08-17 DIAGNOSIS — E11359 Type 2 diabetes mellitus with proliferative diabetic retinopathy without macular edema: Secondary | ICD-10-CM

## 2014-08-17 DIAGNOSIS — H35372 Puckering of macula, left eye: Secondary | ICD-10-CM | POA: Diagnosis not present

## 2014-08-17 DIAGNOSIS — I10 Essential (primary) hypertension: Secondary | ICD-10-CM | POA: Diagnosis not present

## 2014-08-17 DIAGNOSIS — H43813 Vitreous degeneration, bilateral: Secondary | ICD-10-CM | POA: Diagnosis not present

## 2014-08-17 DIAGNOSIS — H35033 Hypertensive retinopathy, bilateral: Secondary | ICD-10-CM

## 2014-08-17 DIAGNOSIS — E11319 Type 2 diabetes mellitus with unspecified diabetic retinopathy without macular edema: Secondary | ICD-10-CM

## 2014-09-11 ENCOUNTER — Other Ambulatory Visit: Payer: Self-pay | Admitting: Cardiovascular Disease

## 2014-11-20 ENCOUNTER — Other Ambulatory Visit: Payer: Self-pay | Admitting: Internal Medicine

## 2015-02-15 ENCOUNTER — Other Ambulatory Visit: Payer: Self-pay | Admitting: Cardiovascular Disease

## 2015-02-23 ENCOUNTER — Ambulatory Visit (INDEPENDENT_AMBULATORY_CARE_PROVIDER_SITE_OTHER): Payer: Medicare Other | Admitting: Ophthalmology

## 2015-02-23 DIAGNOSIS — E113593 Type 2 diabetes mellitus with proliferative diabetic retinopathy without macular edema, bilateral: Secondary | ICD-10-CM

## 2015-02-23 DIAGNOSIS — I10 Essential (primary) hypertension: Secondary | ICD-10-CM | POA: Diagnosis not present

## 2015-02-23 DIAGNOSIS — H35033 Hypertensive retinopathy, bilateral: Secondary | ICD-10-CM

## 2015-02-23 DIAGNOSIS — E11319 Type 2 diabetes mellitus with unspecified diabetic retinopathy without macular edema: Secondary | ICD-10-CM | POA: Diagnosis not present

## 2015-02-26 ENCOUNTER — Encounter: Payer: Self-pay | Admitting: Internal Medicine

## 2015-02-26 ENCOUNTER — Ambulatory Visit (INDEPENDENT_AMBULATORY_CARE_PROVIDER_SITE_OTHER): Payer: Medicare Other | Admitting: Internal Medicine

## 2015-02-26 VITALS — BP 140/74 | HR 61 | Ht 67.0 in | Wt 208.9 lb

## 2015-02-26 DIAGNOSIS — N182 Chronic kidney disease, stage 2 (mild): Secondary | ICD-10-CM

## 2015-02-26 DIAGNOSIS — Z951 Presence of aortocoronary bypass graft: Secondary | ICD-10-CM

## 2015-02-26 DIAGNOSIS — N189 Chronic kidney disease, unspecified: Secondary | ICD-10-CM | POA: Diagnosis not present

## 2015-02-26 DIAGNOSIS — E785 Hyperlipidemia, unspecified: Secondary | ICD-10-CM

## 2015-02-26 DIAGNOSIS — E108 Type 1 diabetes mellitus with unspecified complications: Secondary | ICD-10-CM | POA: Diagnosis not present

## 2015-02-26 DIAGNOSIS — I10 Essential (primary) hypertension: Secondary | ICD-10-CM

## 2015-02-26 NOTE — Patient Instructions (Signed)
Medication Instructions:   Your physician recommends that you continue on your current medications as directed. Please refer to the Current Medication list given to you today.     If you need a refill on your cardiac medications before your next appointment, please call your pharmacy.  Labwork: RETURN IN A WEEK FOR FASTING LIPID AMD CMET    Testing/Procedures: NONE ORDER TODAY   Follow-Up:  Your physician wants you to follow-up in: [place] will receive a reminder letter in the mail two months in advance. If you don't receive a letter, please call our office to schedule the follow-up appointment.     Any Other Special Instructions Will Be Listed Below (If Applicable).

## 2015-02-26 NOTE — Progress Notes (Signed)
OFFICE NOTE  Chief Complaint:  Routine followup  Primary Care Physician: Florina Ou, MD  HPI:  Joseph [name] is a 59 year old gentleman who recently underwent a MET test with a low VO2. I was concerned about multi-vessel coronary disease and then performed cardiac catheterization on him on January 30, 2012. This demonstrated proximal to mid LAD disease and disease of 2 large OM vessels concerning for surgical disease. I talked with Servando Snare and he recommended bypass surgery. He did undergo bypass surgery on February 06, 2012 with a 5-vessel bypass, a LIMA to the LAD, a saphenous vein to the diagonal, a saphenous vein to the distal OM, and sequential reverse saphenous vein graft to the posterior descending and 2nd OM. He did well with this, although it was complicated with some postoperative nausea. He has had some shortness of breath, weakness and slow recovery. He has also had some tenderness in the chest wall around the area of the sternum and incision which he was told by Dr. Servando Snare could take some time to improve. We have also made some adjustments in his medicines. He is undergoing home occupational therapy by [facility] and they are recommending more upper extremity work which I agree with.  Today he continues to complain about tightness across the chest wall. The midline incision does demonstrate a keloid scar, and this may be responsible for his feeling of tightness. It potentially is possible that the sternum was too closely approximated, however Dr. Servando Snare did not indicate the event this was a problem. Overall [name] is doing well without complaints of shortness of breath, chest pain, palpitations, or syncope. Does report some mild lower stray swelling, but his weight has generally been the same.  He is followed by Dr. Moshe Cipro for chronic kidney disease. Finally he reports has been having some problems with what sounds like tinea cruris.  He is currently taking a topical  steroid and prednisone.  [name] returns today and is without complaints. Denies shortness of breath or chest pain. He reports his kidneys have been stable. He has managed to lose a small amount of weight. He says that his diabetes is well controlled. He has not had a cholesterol test in over a year.  [name] returns for follow-up today. He reports he is feeling quite well. He denies any chest pain or shortness of breath. He is noted to sleep a lot by his significant other. He says that since he is disabled he is not as active as he once was. He reported to me that his primary care provider has stopped seeing patients enclosed the clinic. He currently is switching to a new primary care provider. His diabetes is followed by Dr. Chalmers Cater in his A1c is in the range of 5-6.  PMHx:  Past Medical History  Diagnosis Date  . Hypertension   . Depression   . Chronic kidney disease     " My kidneys are not functioning right "  . GERD (gastroesophageal reflux disease)   . Anemia   . Coronary artery disease     Dr Debara Pickett @ [facility] cardiology [place]  . Sleep apnea 2010    refuses to use cpap  . Diabetes mellitus without complication 8295    insulin dependent  . Chronic renal insufficiency, stage III (moderate)     [place] kidney  . Dyslipidemia   . PVD (peripheral vascular disease)   . BPH (benign prostatic hypertrophy)   . Neuropathy   . Gastroparesis due to DM   .  Arthritis   . Anxiety   . Cataract   . Hyperlipidemia   . Myocardial infarction ([place])   . Hx of adenomatous colonic polyps 06/11/2014    Past Surgical History  Procedure Laterality Date  . Ankle fracture surgery    . Coronary artery bypass graft  02/06/2012    Procedure: CORONARY ARTERY BYPASS GRAFTING (CABG); 5- Vessel bypass a LIMA to the LAD, a saphenous vein to the to the diagonal , a saphenous vein to the distal OM, and sequential reverse saphenous vien graft to the posterior descending and 2nd OM. Surgeon:  Grace Isaac, MD;  Location: [place];  Service: Open Heart Surgery;  Laterality: [name];  . Cardiac catheterization  01/30/2012    This demonstrated proximal to mid LAD disease and disease of 2 large OM vessels concerning for surgical disease.  . Esophagogastroduodenoscopy  2005  . Left heart catheterization with coronary angiogram [name] 01/30/2012    Procedure: LEFT HEART CATHETERIZATION WITH CORONARY ANGIOGRAM;  Surgeon: Pixie Casino, MD;  Location: [facility] CATH LAB;  Service: Cardiovascular;  Laterality: [name];    FAMHx:  Family History  Problem Relation Age of Onset  . Diabetes Father   . Hypertension Father   . Cancer Sister     SOCHx:   reports that he has never smoked. He has never used smokeless tobacco. He reports that he does not drink alcohol or use illicit drugs.  ALLERGIES:  No Known Allergies  ROS: A comprehensive review of systems was negative.  HOME MEDS: Current Outpatient Prescriptions  Medication Sig Dispense Refill  . aspirin 81 MG tablet Take 81 mg by mouth daily.    . cholecalciferol (VITAMIN D) 1000 UNITS tablet Take 1,000 Units by mouth daily.    . Clotrimazole-Betamethasone (LOTRISONE EX) Apply topically as needed.    . fenofibrate 160 MG tablet Take 160 mg by mouth daily.    . ferrous sulfate 325 (65 FE) MG tablet Take 325 mg by mouth 2 (two) times daily.    . furosemide (LASIX) 20 MG tablet TAKE 1 TABLET (20 MG TOTAL) BY MOUTH DAILY. 30 tablet 1  . insulin aspart (NOVOLOG) 100 UNIT/ML injection Inject 10-15 Units into the skin 3 (three) times daily before meals. Per sliding scale    . insulin glargine (LANTUS) 100 UNIT/ML injection Inject 20 Units into the skin 2 (two) times daily.    Marland Kitchen lisinopril (PRINIVIL,ZESTRIL) 20 MG tablet TAKE 1 TABLET (20 MG TOTAL) BY MOUTH DAILY. 90 tablet 3  . metoCLOPramide (REGLAN) 10 MG tablet TAKE 1 TABLET (10 MG TOTAL) BY MOUTH 3 (THREE) TIMES DAILY BEFORE MEALS. 90 tablet 2  . metoprolol tartrate (LOPRESSOR) 25 MG tablet TAKE  1 TABLET (25 MG TOTAL) BY MOUTH 2 (TWO) TIMES DAILY. 60 tablet 9  . omeprazole (PRILOSEC) 20 MG capsule Take 40 mg by mouth daily.     . pravastatin (PRAVACHOL) 40 MG tablet TAKE 1 TABLET (40 MG TOTAL) BY MOUTH DAILY. 90 tablet 3  . sertraline (ZOLOFT) 100 MG tablet Take 150 mg by mouth daily.    . Tamsulosin HCl (FLOMAX) 0.4 MG CAPS Take 0.4 mg by mouth daily after supper.     No current facility-administered medications for this visit.    LABS/IMAGING: No results found for this or any previous visit (from the past 48 hour(s)). No results found.  VITALS: BP 140/74 mmHg  Pulse 61  Ht $R'5\' 7"'pX$  (1.702 m)  Wt 208 lb 14.4 oz (94.756 kg)  BMI 32.71 kg/m2  EXAM: General appearance: alert and no distress Neck: no adenopathy, no carotid bruit, no JVD, supple, symmetrical, trachea midline and thyroid not enlarged, symmetric, no tenderness/mass/nodules Lungs: clear to auscultation bilaterally Heart: regular rate and rhythm, S1, S2 normal, no murmur, click, rub or gallop Abdomen: soft, non-tender; bowel sounds normal; no masses,  no organomegaly Extremities: extremities normal, atraumatic, no cyanosis or edema Pulses: 2+ and symmetric Skin: Hypertrophic scar of the midline sternotomy incision Neurologic: Grossly normal  EKG: Sinus rhythm at 61  ASSESSMENT: 1. Coronary disease status post 5 vessel bypass in November 2013, with LIMA to LAD, SVG to diagonal, SVG to OM, SVG to posterior descending and SVG to the second OM 2. Insulin-dependent diabetes 3. Dyslipidemia 4. Hypertension 5. Obesity 6. Chronic kidney disease stage II  PLAN: 1.   Overall Mr. Wolbert is doing very well. He is not complaining of any new chest pain symptoms. Diabetes is well controlled with A1c between 5 and 6. His cholesterol due for reassessment and we'll order that today. Blood pressure is at goal. He is establishing with a new primary care provider as his [place] care doctor retired. Plan to see him back annually  or sooner as necessary.  Pixie Casino, MD, [facility] Attending Cardiologist [place] 02/26/2015, 9:51 AM

## 2015-03-06 LAB — COMPREHENSIVE METABOLIC PANEL
ALT: 14 U/L (ref 9–46)
AST: 18 U/L (ref 10–35)
Albumin: 4.4 g/dL (ref 3.6–5.1)
Alkaline Phosphatase: 38 U/L — ABNORMAL LOW (ref 40–115)
BUN: 19 mg/dL (ref 7–25)
CALCIUM: 9.2 mg/dL (ref 8.6–10.3)
CO2: 30 mmol/L (ref 20–31)
CREATININE: 1.55 mg/dL — AB (ref 0.70–1.33)
Chloride: 102 mmol/L (ref 98–110)
Glucose, Bld: 173 mg/dL — ABNORMAL HIGH (ref 65–99)
Potassium: 4.6 mmol/L (ref 3.5–5.3)
SODIUM: 139 mmol/L (ref 135–146)
Total Bilirubin: 0.5 mg/dL (ref 0.2–1.2)
Total Protein: 7 g/dL (ref 6.1–8.1)

## 2015-03-06 LAB — LIPID PANEL
Cholesterol: 93 mg/dL — ABNORMAL LOW (ref 125–200)
HDL: 37 mg/dL — AB (ref >=40)
LDL Cholesterol: 36 mg/dL (ref <130)
Total CHOL/HDL Ratio: 2.5 Ratio (ref <=5.0)
Triglycerides: 100 mg/dL (ref <150)
VLDL: 20 mg/dL (ref <30)

## 2015-03-12 ENCOUNTER — Telehealth: Payer: Self-pay

## 2015-03-12 NOTE — Telephone Encounter (Signed)
Patient's daughter (DPR) returned call for lab results. Informed patient's daughter of note below about labs. Patient's daughter verbalized understanding and had no other questions.  Notes Recorded by Pixie Casino, MD on 03/11/2015 at 6:39 PM Labs excellent - cholesterol is very low. No changes.  DR. Lemmie Evens

## 2015-03-19 ENCOUNTER — Encounter: Payer: Self-pay | Admitting: *Deleted

## 2015-04-22 ENCOUNTER — Other Ambulatory Visit: Payer: Self-pay | Admitting: Internal Medicine

## 2015-04-23 NOTE — Telephone Encounter (Signed)
REFILL 

## 2015-04-30 DIAGNOSIS — I739 Peripheral vascular disease, unspecified: Secondary | ICD-10-CM | POA: Insufficient documentation

## 2015-04-30 DIAGNOSIS — N400 Enlarged prostate without lower urinary tract symptoms: Secondary | ICD-10-CM | POA: Insufficient documentation

## 2015-04-30 DIAGNOSIS — F330 Major depressive disorder, recurrent, mild: Secondary | ICD-10-CM | POA: Insufficient documentation

## 2015-05-03 DIAGNOSIS — S8991XS Unspecified injury of right lower leg, sequela: Secondary | ICD-10-CM | POA: Insufficient documentation

## 2015-05-03 DIAGNOSIS — E1142 Type 2 diabetes mellitus with diabetic polyneuropathy: Secondary | ICD-10-CM | POA: Insufficient documentation

## 2015-05-03 DIAGNOSIS — I2510 Atherosclerotic heart disease of native coronary artery without angina pectoris: Secondary | ICD-10-CM | POA: Insufficient documentation

## 2015-05-05 ENCOUNTER — Other Ambulatory Visit: Payer: Self-pay | Admitting: *Deleted

## 2015-05-05 MED ORDER — PRAVASTATIN SODIUM 40 MG PO TABS: 40.0000 mg | ORAL_TABLET | Freq: Every day | ORAL | Status: DC

## 2015-06-02 ENCOUNTER — Other Ambulatory Visit: Payer: Self-pay | Admitting: Internal Medicine

## 2015-06-04 NOTE — Telephone Encounter (Signed)
Can refill x 3 but needs ov for more

## 2015-06-04 NOTE — Telephone Encounter (Signed)
May I refill Sir, thank you. 

## 2015-08-25 ENCOUNTER — Ambulatory Visit (INDEPENDENT_AMBULATORY_CARE_PROVIDER_SITE_OTHER): Payer: Medicare Other | Admitting: Ophthalmology

## 2015-08-25 DIAGNOSIS — H35033 Hypertensive retinopathy, bilateral: Secondary | ICD-10-CM

## 2015-08-25 DIAGNOSIS — I10 Essential (primary) hypertension: Secondary | ICD-10-CM | POA: Diagnosis not present

## 2015-08-25 DIAGNOSIS — E113593 Type 2 diabetes mellitus with proliferative diabetic retinopathy without macular edema, bilateral: Secondary | ICD-10-CM

## 2015-08-25 DIAGNOSIS — E11319 Type 2 diabetes mellitus with unspecified diabetic retinopathy without macular edema: Secondary | ICD-10-CM | POA: Diagnosis not present

## 2015-10-12 ENCOUNTER — Other Ambulatory Visit: Payer: Self-pay | Admitting: Internal Medicine

## 2015-10-13 NOTE — Telephone Encounter (Signed)
Please advise Sir, thank you. 

## 2015-10-14 NOTE — Telephone Encounter (Signed)
Refill 4 months Needs office visit next available

## 2016-02-25 ENCOUNTER — Ambulatory Visit (INDEPENDENT_AMBULATORY_CARE_PROVIDER_SITE_OTHER): Payer: Medicare Other | Admitting: Ophthalmology

## 2016-02-25 DIAGNOSIS — I10 Essential (primary) hypertension: Secondary | ICD-10-CM | POA: Diagnosis not present

## 2016-02-25 DIAGNOSIS — H35033 Hypertensive retinopathy, bilateral: Secondary | ICD-10-CM

## 2016-02-25 DIAGNOSIS — E11319 Type 2 diabetes mellitus with unspecified diabetic retinopathy without macular edema: Secondary | ICD-10-CM

## 2016-02-25 DIAGNOSIS — E113593 Type 2 diabetes mellitus with proliferative diabetic retinopathy without macular edema, bilateral: Secondary | ICD-10-CM | POA: Diagnosis not present

## 2016-03-09 ENCOUNTER — Ambulatory Visit (INDEPENDENT_AMBULATORY_CARE_PROVIDER_SITE_OTHER): Payer: Medicare Other | Admitting: Internal Medicine

## 2016-03-09 VITALS — BP 132/86 | HR 54 | Ht 67.0 in | Wt 200.4 lb

## 2016-03-09 DIAGNOSIS — I10 Essential (primary) hypertension: Secondary | ICD-10-CM

## 2016-03-09 DIAGNOSIS — Z951 Presence of aortocoronary bypass graft: Secondary | ICD-10-CM

## 2016-03-09 DIAGNOSIS — E785 Hyperlipidemia, unspecified: Secondary | ICD-10-CM | POA: Diagnosis not present

## 2016-03-09 DIAGNOSIS — E108 Type 1 diabetes mellitus with unspecified complications: Secondary | ICD-10-CM | POA: Diagnosis not present

## 2016-03-09 DIAGNOSIS — N182 Chronic kidney disease, stage 2 (mild): Secondary | ICD-10-CM

## 2016-03-09 NOTE — Patient Instructions (Signed)
Your physician wants you to follow-up in: ONE YEAR with Dr. [name]. You will receive a reminder letter in the mail two months in advance. If you don't receive a letter, please call our office to schedule the follow-up appointment.  

## 2016-03-10 ENCOUNTER — Encounter: Payer: Self-pay | Admitting: Internal Medicine

## 2016-03-10 DIAGNOSIS — E785 Hyperlipidemia, unspecified: Secondary | ICD-10-CM | POA: Insufficient documentation

## 2016-03-10 NOTE — Progress Notes (Signed)
OFFICE NOTE  Chief Complaint:  Routine followup  Primary Care Physician: Florina Ou, MD (Inactive)  HPI:  Joseph [name] is a 60 year old gentleman who recently underwent a MET test with a low VO2. I was concerned about multi-vessel coronary disease and then performed cardiac catheterization on him on January 30, 2012. This demonstrated proximal to mid LAD disease and disease of 2 large OM vessels concerning for surgical disease. I talked with Servando Snare and he recommended bypass surgery. He did undergo bypass surgery on February 06, 2012 with a 5-vessel bypass, a LIMA to the LAD, a saphenous vein to the diagonal, a saphenous vein to the distal OM, and sequential reverse saphenous vein graft to the posterior descending and 2nd OM. He did well with this, although it was complicated with some postoperative nausea. He has had some shortness of breath, weakness and slow recovery. He has also had some tenderness in the chest wall around the area of the sternum and incision which he was told by Dr. Servando Snare could take some time to improve. We have also made some adjustments in his medicines. He is undergoing home occupational therapy by Arville Go and they are recommending more upper extremity work which I agree with.  Today he continues to complain about tightness across the chest wall. The midline incision does demonstrate a keloid scar, and this may be responsible for his feeling of tightness. It potentially is possible that the sternum was too closely approximated, however Dr. Servando Snare did not indicate the event this was a problem. Overall Joseph [name] is doing well without complaints of shortness of breath, chest pain, palpitations, or syncope. Does report some mild lower stray swelling, but his weight has generally been the same.  He is followed by Dr. Moshe Cipro for chronic kidney disease. Finally he reports has been having some problems with what sounds like tinea cruris.  He is currently taking a  topical steroid and prednisone.  Joseph [name] returns today and is without complaints. Denies shortness of breath or chest pain. He reports his kidneys have been stable. He has managed to lose a small amount of weight. He says that his diabetes is well controlled. He has not had a cholesterol test in over a year.  Joseph [name] returns for follow-up today. He reports he is feeling quite well. He denies any chest pain or shortness of breath. He is noted to sleep a lot by his significant other. He says that since he is disabled he is not as active as he once was. He reported to me that his primary care provider has stopped seeing patients enclosed the clinic. He currently is switching to a new primary care provider. His diabetes is followed by Dr. Chalmers Cater in his A1c is in the range of 5-6.  03/09/2016  Joseph [name] was seen again today in follow-up. Overall he is again without complaints. Blood pressure is well-controlled. Weight is fairly stable, in fact actually down 8 pounds. He reports pretty good diabetes control. His cholesterol is at goal. He denies any recurrent chest pain. He continues to feel tightness in his chest wall which I think is related to scar tissue, particularly a keloid scar at the chest wall incision. EKG shows sinus bradycardia 54 with moderate voltage criteria for LVH.  PMHx:  Past Medical History:  Diagnosis Date  . Anemia   . Anxiety   . Arthritis   . BPH (benign prostatic hypertrophy)   . Cataract   . Chronic kidney disease    "  My kidneys are not functioning right "  . Chronic renal insufficiency, stage III (moderate)    Joseph [name] kidney  . Coronary artery disease    Dr Rennis Golden @ [facility] cardiology [place]  . Depression   . Diabetes mellitus without complication (HCC) 1988   insulin dependent  . Dyslipidemia   . Gastroparesis due to DM (HCC)   . GERD (gastroesophageal reflux disease)   . Hx of adenomatous colonic polyps 06/11/2014  . Hyperlipidemia   .  Hypertension   . Myocardial infarction   . Neuropathy (HCC)   . PVD (peripheral vascular disease) (HCC)   . Sleep apnea 2010   refuses to use cpap    Past Surgical History:  Procedure Laterality Date  . ANKLE FRACTURE SURGERY    . CARDIAC CATHETERIZATION  01/30/2012   This demonstrated proximal to mid LAD disease and disease of 2 large OM vessels concerning for surgical disease.  . CORONARY ARTERY BYPASS GRAFT  02/06/2012   Procedure: CORONARY ARTERY BYPASS GRAFTING (CABG); 5- Vessel bypass a LIMA to the LAD, a saphenous vein to the to the diagonal , a saphenous vein to the distal OM, and sequential reverse saphenous vien graft to the posterior descending and 2nd OM. Surgeon: Delight Ovens, MD;  Location: [facility] OR;  Service: Open Heart Surgery;  Laterality: [name];  . ESOPHAGOGASTRODUODENOSCOPY  2005  . LEFT HEART CATHETERIZATION WITH CORONARY ANGIOGRAM [name] 01/30/2012   Procedure: LEFT HEART CATHETERIZATION WITH CORONARY ANGIOGRAM;  Surgeon: Chrystie Nose, MD;  Location: [facility] CATH LAB;  Service: Cardiovascular;  Laterality: [name];    FAMHx:  Family History  Problem Relation Age of Onset  . Diabetes Father   . Hypertension Father   . Cancer Sister     SOCHx:   reports that he has never smoked. He has never used smokeless tobacco. He reports that he does not drink alcohol or use drugs.  ALLERGIES:  No Known Allergies  ROS: Pertinent items noted in HPI and remainder of comprehensive ROS otherwise negative.  HOME MEDS: Current Outpatient Prescriptions  Medication Sig Dispense Refill  . aspirin 81 MG tablet Take 81 mg by mouth daily.    . cholecalciferol (VITAMIN D) 1000 UNITS tablet Take 1,000 Units by mouth daily.    . Clotrimazole-Betamethasone (LOTRISONE EX) Apply topically as needed.    . fenofibrate 160 MG tablet Take 160 mg by mouth daily.    . ferrous sulfate 325 (65 FE) MG tablet Take 325 mg by mouth 2 (two) times daily.    . furosemide (LASIX) 20 MG tablet TAKE 1 TABLET  EVERY DAY 30 tablet 11  . insulin aspart (NOVOLOG) 100 UNIT/ML injection Inject 10-15 Units into the skin 3 (three) times daily before meals. Per sliding scale    . insulin glargine (LANTUS) 100 UNIT/ML injection Inject 20 Units into the skin 2 (two) times daily.    Marland Kitchen lisinopril (PRINIVIL,ZESTRIL) 20 MG tablet TAKE 1 TABLET (20 MG TOTAL) BY MOUTH DAILY. 90 tablet 3  . metoCLOPramide (REGLAN) 10 MG tablet TAKE 1 TABLET BY MOUTH 3 TIMES A DAY BEFORE MEALS 90 tablet 3  . metoprolol tartrate (LOPRESSOR) 25 MG tablet TAKE 1 TABLET (25 MG TOTAL) BY MOUTH 2 (TWO) TIMES DAILY. 60 tablet 9  . omeprazole (PRILOSEC) 20 MG capsule Take 40 mg by mouth daily.     . pravastatin (PRAVACHOL) 40 MG tablet Take 1 tablet (40 mg total) by mouth daily. 90 tablet 3  . sertraline (ZOLOFT) 100 MG tablet Take 150  mg by mouth daily.     No current facility-administered medications for this visit.     LABS/IMAGING: No results found for this or any previous visit (from the past 48 hour(s)). No results found.  VITALS: BP 132/86   Pulse (!) 54   Ht '5\' 7"'$  (1.702 m)   Wt 200 lb 6.4 oz (90.9 kg)   BMI 31.39 kg/m   EXAM: General appearance: alert and no distress Neck: no adenopathy, no carotid bruit, no JVD, supple, symmetrical, trachea midline and thyroid not enlarged, symmetric, no tenderness/mass/nodules Lungs: clear to auscultation bilaterally Heart: regular rate and rhythm, S1, S2 normal, no murmur, click, rub or gallop Abdomen: soft, non-tender; bowel sounds normal; no masses,  no organomegaly Extremities: extremities normal, atraumatic, no cyanosis or edema Pulses: 2+ and symmetric Skin: Hypertrophic scar of the midline sternotomy incision Neurologic: Grossly normal  EKG: Sinus bradycardia 54, moderate old criteria for LVH  ASSESSMENT: 1. Coronary disease status post 5 vessel bypass in November 2013, with LIMA to LAD, SVG to diagonal, SVG to OM, SVG to posterior descending and SVG to the second  OM 2. Insulin-dependent diabetes 3. Dyslipidemia 4. Hypertension 5. Obesity 6. Chronic kidney disease stage II  PLAN: 1.   Overall Mr. Reineck is doing very well. He is not complaining of any new chest pain symptoms. Diabetes is well controlled with A1c between 5 and 6. Blood pressure is at goal. He has managed to lose about 8 pounds. Overall he is doing well. Follow-up with me annually or sooner as necessary.  Pixie Casino, MD, [facility] Attending Cardiologist [place] 03/10/2016, 6:07 PM

## 2016-04-02 ENCOUNTER — Other Ambulatory Visit: Payer: Self-pay | Admitting: Internal Medicine

## 2016-04-03 NOTE — Telephone Encounter (Signed)
May I refill Sir? Thank you for your time. 

## 2016-04-04 NOTE — Telephone Encounter (Signed)
Will give 2 refills more but needs to schedule appt He speaks Spanish so may need a letter vs a Spanish-speaker call

## 2016-04-04 NOTE — Telephone Encounter (Signed)
Spoke with pharmacist and they will tell the patient he needs to call us for an office visit.

## 2016-04-24 ENCOUNTER — Other Ambulatory Visit: Payer: Self-pay | Admitting: Internal Medicine

## 2016-04-25 NOTE — Telephone Encounter (Signed)
Rx(s) sent to pharmacy electronically.  

## 2016-06-05 ENCOUNTER — Other Ambulatory Visit: Payer: Self-pay | Admitting: Internal Medicine

## 2016-06-06 NOTE — Telephone Encounter (Signed)
May I refill Sir, thank you. 

## 2016-06-06 NOTE — Telephone Encounter (Signed)
2 months refills I should see him to check on him also

## 2016-07-13 ENCOUNTER — Telehealth: Payer: Self-pay | Admitting: Internal Medicine

## 2016-07-13 NOTE — Telephone Encounter (Signed)
New Message    1. What dental office are you calling from? [facility] DDS  2. What is your office phone and fax number? [phone] fax [phone]   3. What type of procedure is the patient having performed? Root Canal   4. What date is procedure scheduled? 07/14/16 am  5. What is your question (ex. Antibiotics prior to procedure, holding medication-we need to know how long dentist wants pt to hold med)?  Not sure , they need you to call tonight

## 2016-07-13 NOTE — Telephone Encounter (Signed)
Reviewed chart, patient does not require antibiotic prophylaxis for dental procedures

## 2016-07-13 NOTE — Telephone Encounter (Signed)
Pt of [name] for dental clearance   Pt sees [place] for HTN, hx of CABG in 2013.  Current antiplatelet therapy limited to 81mg  ASA daily only No documentation of history of valve repair.  Per criteria does not appear to need antibiotic proph  Will route to pharmD to confirm.

## 2016-07-13 NOTE — Telephone Encounter (Signed)
Faxed encounter note to office after confirming phone number w receptionist, and communicating advisements verbally. She voiced thanks and will be on look out for fax.

## 2016-08-15 ENCOUNTER — Other Ambulatory Visit: Payer: Self-pay | Admitting: Internal Medicine

## 2016-08-25 ENCOUNTER — Ambulatory Visit (INDEPENDENT_AMBULATORY_CARE_PROVIDER_SITE_OTHER): Payer: Medicare Other | Admitting: Ophthalmology

## 2016-09-02 ENCOUNTER — Other Ambulatory Visit: Payer: Self-pay | Admitting: Internal Medicine

## 2016-10-05 ENCOUNTER — Ambulatory Visit (INDEPENDENT_AMBULATORY_CARE_PROVIDER_SITE_OTHER): Payer: Medicare Other | Admitting: Ophthalmology

## 2016-10-05 DIAGNOSIS — I10 Essential (primary) hypertension: Secondary | ICD-10-CM | POA: Diagnosis not present

## 2016-10-05 DIAGNOSIS — H35033 Hypertensive retinopathy, bilateral: Secondary | ICD-10-CM | POA: Diagnosis not present

## 2016-10-05 DIAGNOSIS — E11319 Type 2 diabetes mellitus with unspecified diabetic retinopathy without macular edema: Secondary | ICD-10-CM | POA: Diagnosis not present

## 2016-10-05 DIAGNOSIS — E113593 Type 2 diabetes mellitus with proliferative diabetic retinopathy without macular edema, bilateral: Secondary | ICD-10-CM | POA: Diagnosis not present

## 2016-10-09 ENCOUNTER — Other Ambulatory Visit: Payer: Self-pay | Admitting: Internal Medicine

## 2016-10-10 NOTE — Telephone Encounter (Signed)
Please advise Sir, last seen 03/2014.

## 2016-10-11 NOTE — Telephone Encounter (Signed)
Refilled medicine and Lorriane Shire one of our Spanish speaking PCC's will reach out to patient to set up appointment with Dr Carlean Purl.

## 2016-10-11 NOTE — Telephone Encounter (Signed)
We can do 3 refills but I need to see him also next avail

## 2017-01-02 ENCOUNTER — Other Ambulatory Visit: Payer: Self-pay | Admitting: Internal Medicine

## 2017-01-05 ENCOUNTER — Other Ambulatory Visit: Payer: Self-pay | Admitting: *Deleted

## 2017-01-05 MED ORDER — FUROSEMIDE 20 MG PO TABS: 20.0000 mg | ORAL_TABLET | Freq: Every day | ORAL | 0 refills | Status: DC

## 2017-01-17 ENCOUNTER — Other Ambulatory Visit: Payer: Self-pay | Admitting: Internal Medicine

## 2017-01-17 NOTE — Telephone Encounter (Signed)
Please advise Sir, thank you. 

## 2017-01-19 NOTE — Telephone Encounter (Signed)
Can't do it w/o an OV

## 2017-01-24 ENCOUNTER — Other Ambulatory Visit: Payer: Self-pay | Admitting: Internal Medicine

## 2017-01-26 ENCOUNTER — Telehealth: Payer: Self-pay | Admitting: Internal Medicine

## 2017-01-26 NOTE — Telephone Encounter (Signed)
I called Joseph [name] but he did not answer. I left a voicemail to call back. I also called his 3 daughter's and left a voicemail to call back & schedule an Office Visit for more refills.

## 2017-03-29 ENCOUNTER — Other Ambulatory Visit: Payer: Self-pay

## 2017-03-29 MED ORDER — FUROSEMIDE 20 MG PO TABS: 20.0000 mg | ORAL_TABLET | Freq: Every day | ORAL | 0 refills | Status: DC

## 2017-04-09 ENCOUNTER — Encounter (INDEPENDENT_AMBULATORY_CARE_PROVIDER_SITE_OTHER): Payer: Medicare Other | Admitting: Ophthalmology

## 2017-04-09 DIAGNOSIS — H35033 Hypertensive retinopathy, bilateral: Secondary | ICD-10-CM | POA: Diagnosis not present

## 2017-04-09 DIAGNOSIS — I10 Essential (primary) hypertension: Secondary | ICD-10-CM | POA: Diagnosis not present

## 2017-04-09 DIAGNOSIS — E113593 Type 2 diabetes mellitus with proliferative diabetic retinopathy without macular edema, bilateral: Secondary | ICD-10-CM

## 2017-04-09 DIAGNOSIS — E11319 Type 2 diabetes mellitus with unspecified diabetic retinopathy without macular edema: Secondary | ICD-10-CM | POA: Diagnosis not present

## 2017-04-13 ENCOUNTER — Other Ambulatory Visit: Payer: Self-pay | Admitting: Internal Medicine

## 2017-04-13 NOTE — Telephone Encounter (Signed)
REFILL 

## 2017-05-01 ENCOUNTER — Other Ambulatory Visit: Payer: Self-pay | Admitting: Internal Medicine

## 2017-05-31 ENCOUNTER — Encounter: Payer: Self-pay | Admitting: Internal Medicine

## 2017-05-31 ENCOUNTER — Ambulatory Visit (INDEPENDENT_AMBULATORY_CARE_PROVIDER_SITE_OTHER): Payer: Medicare Other | Admitting: Internal Medicine

## 2017-05-31 VITALS — BP 120/60 | HR 56 | Ht 67.0 in | Wt 203.0 lb

## 2017-05-31 DIAGNOSIS — Z951 Presence of aortocoronary bypass graft: Secondary | ICD-10-CM | POA: Diagnosis not present

## 2017-05-31 DIAGNOSIS — E785 Hyperlipidemia, unspecified: Secondary | ICD-10-CM

## 2017-05-31 DIAGNOSIS — I10 Essential (primary) hypertension: Secondary | ICD-10-CM

## 2017-05-31 DIAGNOSIS — N182 Chronic kidney disease, stage 2 (mild): Secondary | ICD-10-CM

## 2017-05-31 DIAGNOSIS — E108 Type 1 diabetes mellitus with unspecified complications: Secondary | ICD-10-CM

## 2017-05-31 NOTE — Patient Instructions (Addendum)
Schedule Stress Myoview    Hold Metoprolol day before test   Hold Insulin and Furosemide morning of test    Your physician wants you to follow-up in: 1 year. You will receive a reminder letter in the mail two months in advance. If you don't receive a letter, please call our office to schedule the follow-up appointment.

## 2017-05-31 NOTE — Progress Notes (Signed)
OFFICE NOTE  Chief Complaint:  Routine followup  Primary Care Physician: Hayden Rasmussen, MD  HPI:  Joseph [name] is a 62 year old gentleman who recently underwent a MET test with a low VO2. I was concerned about multi-vessel coronary disease and then performed cardiac catheterization on him on January 30, 2012. This demonstrated proximal to mid LAD disease and disease of 2 large OM vessels concerning for surgical disease. I talked with Servando Snare and he recommended bypass surgery. He did undergo bypass surgery on February 06, 2012 with a 5-vessel bypass, a LIMA to the LAD, a saphenous vein to the diagonal, a saphenous vein to the distal OM, and sequential reverse saphenous vein graft to the posterior descending and 2nd OM. He did well with this, although it was complicated with some postoperative nausea. He has had some shortness of breath, weakness and slow recovery. He has also had some tenderness in the chest wall around the area of the sternum and incision which he was told by Dr. Servando Snare could take some time to improve. We have also made some adjustments in his medicines. He is undergoing home occupational therapy by Arville Go and they are recommending more upper extremity work which I agree with.  Today he continues to complain about tightness across the chest wall. The midline incision does demonstrate a keloid scar, and this may be responsible for his feeling of tightness. It potentially is possible that the sternum was too closely approximated, however Dr. Servando Snare did not indicate the event this was a problem. Overall Joseph [name] is doing well without complaints of shortness of breath, chest pain, palpitations, or syncope. Does report some mild lower stray swelling, but his weight has generally been the same.  He is followed by Dr. Moshe Cipro for chronic kidney disease. Finally he reports has been having some problems with what sounds like tinea cruris.  He is currently taking a topical  steroid and prednisone.  Joseph [name] returns today and is without complaints. Denies shortness of breath or chest pain. He reports his kidneys have been stable. He has managed to lose a small amount of weight. He says that his diabetes is well controlled. He has not had a cholesterol test in over a year.  Joseph [name] returns for follow-up today. He reports he is feeling quite well. He denies any chest pain or shortness of breath. He is noted to sleep a lot by his significant other. He says that since he is disabled he is not as active as he once was. He reported to me that his primary care provider has stopped seeing patients enclosed the clinic. He currently is switching to a new primary care provider. His diabetes is followed by Dr. Chalmers Cater in his A1c is in the range of 5-6.  03/09/2016  Joseph [name] was seen again today in follow-up. Overall he is again without complaints. Blood pressure is well-controlled. Weight is fairly stable, in fact actually down 8 pounds. He reports pretty good diabetes control. His cholesterol is at goal. He denies any recurrent chest pain. He continues to feel tightness in his chest wall which I think is related to scar tissue, particularly a keloid scar at the chest wall incision. EKG shows sinus bradycardia 54 with moderate voltage criteria for LVH.  05/31/2017  Joseph [name] returns today for follow-up.  He is done well over the past year and a half.  He denies any chest pain or worsening shortness of breath but is not very physically active.  Weight  has been fairly stable but could be improved.  Blood pressure is at goal today.  He is followed by Dr. Horald Pollen for primary care.  We recently received some lab work which was completed in September 2017.  His LDL at that time was 51 hemoglobin A1c of 6.7.  He is coronary artery bypass grafting was in 2013 and he is not had an ischemia evaluation since that time.  PMHx:  Past Medical History:  Diagnosis Date  . Anemia   .  Anxiety   . Arthritis   . BPH (benign prostatic hypertrophy)   . Cataract   . Chronic kidney disease    " My kidneys are not functioning right "  . Chronic renal insufficiency, stage III (moderate) ([place])    [place] [facility]  . Coronary artery disease    Dr Debara Pickett @ [facility] cardiology [place]  . Depression   . Diabetes mellitus without complication ([place]) 1448   insulin dependent  . Dyslipidemia   . Gastroparesis due to DM ([place])   . GERD (gastroesophageal reflux disease)   . Hx of adenomatous colonic polyps 06/11/2014  . Hyperlipidemia   . Hypertension   . Myocardial infarction ([place])   . Neuropathy   . PVD (peripheral vascular disease) ([place])   . Sleep apnea 2010   refuses to use cpap    Past Surgical History:  Procedure Laterality Date  . ANKLE FRACTURE SURGERY    . CARDIAC CATHETERIZATION  01/30/2012   This demonstrated proximal to mid LAD disease and disease of 2 large OM vessels concerning for surgical disease.  . CORONARY ARTERY BYPASS GRAFT  02/06/2012   Procedure: CORONARY ARTERY BYPASS GRAFTING (CABG); 5- Vessel bypass a LIMA to the LAD, a saphenous vein to the to the diagonal , a saphenous vein to the distal OM, and sequential reverse saphenous vien graft to the posterior descending and 2nd OM. Surgeon: Grace Isaac, MD;  Location: [place];  Service: Open Heart Surgery;  Laterality: [name];  . ESOPHAGOGASTRODUODENOSCOPY  2005  . LEFT HEART CATHETERIZATION WITH CORONARY ANGIOGRAM [name] 01/30/2012   Procedure: LEFT HEART CATHETERIZATION WITH CORONARY ANGIOGRAM;  Surgeon: Pixie Casino, MD;  Location: [facility] CATH LAB;  Service: Cardiovascular;  Laterality: [name];    FAMHx:  Family History  Problem Relation Age of Onset  . Diabetes Father   . Hypertension Father   . Cancer Sister     SOCHx:   reports that  has never smoked. he has never used smokeless tobacco. He reports that he does not drink alcohol or use drugs.  ALLERGIES:  No Known Allergies  ROS: Pertinent  items noted in HPI and remainder of comprehensive ROS otherwise negative.  HOME MEDS: Current Outpatient Medications  Medication Sig Dispense Refill  . aspirin 81 MG tablet Take 81 mg by mouth daily.    . cholecalciferol (VITAMIN D) 1000 UNITS tablet Take 1,000 Units by mouth daily.    . Clotrimazole-Betamethasone (LOTRISONE EX) Apply topically as needed.    . fenofibrate 160 MG tablet Take 160 mg by mouth daily.    . ferrous sulfate 325 (65 FE) MG tablet Take 325 mg by mouth 2 (two) times daily.    . furosemide (LASIX) 20 MG tablet TAKE 1 TABLET (20 MG TOTAL) BY MOUTH DAILY. PLEASE CONTACT OFFICE FOR APPOINTMENT NEED OV. 30 tablet 0  . insulin aspart (NOVOLOG) 100 UNIT/ML injection Inject 10-15 Units into the skin 3 (three) times daily before meals. Per sliding scale    .  Insulin Degludec (TRESIBA FLEXTOUCH [place]) Inject 15 Units into the skin daily.    Marland Kitchen lisinopril (PRINIVIL,ZESTRIL) 20 MG tablet TAKE 1 TABLET (20 MG TOTAL) BY MOUTH DAILY. 90 tablet 3  . metoCLOPramide (REGLAN) 10 MG tablet TAKE 1 TABLET BY MOUTH THREE TIMES A DAY BEFORE MEALS 90 tablet 2  . metoprolol tartrate (LOPRESSOR) 25 MG tablet TAKE 1 TABLET (25 MG TOTAL) BY MOUTH 2 (TWO) TIMES DAILY. 60 tablet 9  . omeprazole (PRILOSEC) 20 MG capsule Take 40 mg by mouth daily.     . pravastatin (PRAVACHOL) 40 MG tablet Take 1 tablet (40 mg total) by mouth daily. NEED OV. 90 tablet 0  . sertraline (ZOLOFT) 100 MG tablet Take 150 mg by mouth daily.     No current facility-administered medications for this visit.     LABS/IMAGING: No results found for this or any previous visit (from the past 48 hour(s)). No results found.  VITALS: BP 120/60   Pulse (!) 56   Ht '5\' 7"'$  (1.702 m)   Wt 203 lb (92.1 kg)   BMI 31.79 kg/m   EXAM: General appearance: alert and no distress Neck: no adenopathy, no carotid bruit, no JVD, supple, symmetrical, trachea midline and thyroid not enlarged, symmetric, no tenderness/mass/nodules Lungs: clear  to auscultation bilaterally Heart: regular rate and rhythm, S1, S2 normal, no murmur, click, rub or gallop Abdomen: soft, non-tender; bowel sounds normal; no masses,  no organomegaly Extremities: extremities normal, atraumatic, no cyanosis or edema Pulses: 2+ and symmetric Skin: Hypertrophic scar of the midline sternotomy incision Neurologic: Grossly normal  EKG: Sinus bradycardia 56, minimal voltage criteria for LVH - personally reviewed  ASSESSMENT: 1. Coronary disease status post 5 vessel bypass in November 2013, with LIMA to LAD, SVG to diagonal, SVG to OM, SVG to posterior descending and SVG to the second OM 2. Insulin-dependent diabetes 3. Dyslipidemia 4. Hypertension 5. Obesity 6. Chronic kidney disease stage II  PLAN: 1.  [place] is asymptomatic, however he is not had an ischemia eval since 5 vessel bypass in 2013.  Per ACC guidelines, will order an exercise Myoview.  He is type 2 diabetes and dyslipidemia followed by his primary care provider.  Hemoglobin A1c was reasonably well controlled however needs to be reassessed.  He does have follow-up with her fairly soon.  I will contact him with results of his stress test otherwise plan to see him back annually or sooner as necessary.  Pixie Casino, MD, [facility], [place] Director of the [facility] Lipid Disorders &  Cardiovascular Risk Reduction Clinic Diplomate of the American Board of Clinical Lipidology Attending Cardiologist  Direct Dial: [phone]  Fax: [phone]  Website:  [name] 05/31/2017, 9:01 AM

## 2017-06-01 ENCOUNTER — Telehealth (HOSPITAL_COMMUNITY): Payer: Self-pay

## 2017-06-01 NOTE — Telephone Encounter (Signed)
Encounter complete. 

## 2017-06-04 ENCOUNTER — Telehealth: Payer: Self-pay | Admitting: Internal Medicine

## 2017-06-04 NOTE — Telephone Encounter (Signed)
New Message:   Pt was returning a call from Friday. Estill Bamberg left a message with patient.

## 2017-06-05 ENCOUNTER — Ambulatory Visit (HOSPITAL_COMMUNITY)
Admission: RE | Admit: 2017-06-05 | Discharge: 2017-06-05 | Disposition: A | Discharge status: Home / Self Care | Payer: Medicare Other | Source: Ambulatory Visit | Attending: Cardiovascular Disease | Admitting: Cardiovascular Disease

## 2017-06-05 DIAGNOSIS — I739 Peripheral vascular disease, unspecified: Secondary | ICD-10-CM | POA: Insufficient documentation

## 2017-06-05 DIAGNOSIS — E119 Type 2 diabetes mellitus without complications: Secondary | ICD-10-CM | POA: Insufficient documentation

## 2017-06-05 DIAGNOSIS — E663 Overweight: Secondary | ICD-10-CM | POA: Insufficient documentation

## 2017-06-05 DIAGNOSIS — I252 Old myocardial infarction: Secondary | ICD-10-CM | POA: Diagnosis not present

## 2017-06-05 DIAGNOSIS — I2510 Atherosclerotic heart disease of native coronary artery without angina pectoris: Secondary | ICD-10-CM | POA: Insufficient documentation

## 2017-06-05 DIAGNOSIS — K219 Gastro-esophageal reflux disease without esophagitis: Secondary | ICD-10-CM | POA: Insufficient documentation

## 2017-06-05 DIAGNOSIS — I10 Essential (primary) hypertension: Secondary | ICD-10-CM

## 2017-06-05 DIAGNOSIS — Z8249 Family history of ischemic heart disease and other diseases of the circulatory system: Secondary | ICD-10-CM | POA: Insufficient documentation

## 2017-06-05 DIAGNOSIS — Z951 Presence of aortocoronary bypass graft: Secondary | ICD-10-CM

## 2017-06-05 DIAGNOSIS — I6529 Occlusion and stenosis of unspecified carotid artery: Secondary | ICD-10-CM | POA: Diagnosis not present

## 2017-06-05 DIAGNOSIS — N183 Chronic kidney disease, stage 3 (moderate): Secondary | ICD-10-CM | POA: Insufficient documentation

## 2017-06-05 DIAGNOSIS — G4733 Obstructive sleep apnea (adult) (pediatric): Secondary | ICD-10-CM | POA: Diagnosis not present

## 2017-06-05 LAB — MYOCARDIAL PERFUSION IMAGING
CHL CUP NUCLEAR SDS: 1
CHL CUP NUCLEAR SRS: 0
CHL CUP RESTING HR STRESS: 58 {beats}/min
CSEPPHR: 93 {beats}/min
LV dias vol: 104 mL (ref 62–150)
LV sys vol: 39 mL
SSS: 1
TID: 0.98

## 2017-06-05 MED ORDER — REGADENOSON 0.4 MG/5ML IV SOLN
0.4000 mg | Freq: Once | INTRAVENOUS | Status: AC
  Administered 2017-06-05: 0.4 mg via INTRAVENOUS

## 2017-06-05 MED ORDER — TECHNETIUM TC 99M TETROFOSMIN IV KIT
30.4000 | PACK | Freq: Once | INTRAVENOUS | Status: AC | PRN
  Administered 2017-06-05: 30.4 via INTRAVENOUS
  Filled 2017-06-05: qty 31

## 2017-06-05 MED ORDER — TECHNETIUM TC 99M TETROFOSMIN IV KIT
9.9000 | PACK | Freq: Once | INTRAVENOUS | Status: AC | PRN
  Administered 2017-06-05: 9.9 via INTRAVENOUS
  Filled 2017-06-05: qty 10

## 2017-06-05 MED ORDER — AMINOPHYLLINE 25 MG/ML IV SOLN
75.0000 mg | Freq: Once | INTRAVENOUS | Status: AC
Start: 2017-06-05 — End: 2017-06-05
  Administered 2017-06-05: 75 mg via INTRAVENOUS

## 2017-07-04 ENCOUNTER — Other Ambulatory Visit: Payer: Self-pay | Admitting: Internal Medicine

## 2017-10-08 ENCOUNTER — Other Ambulatory Visit (HOSPITAL_COMMUNITY): Payer: Self-pay | Admitting: Family Medicine

## 2017-10-08 ENCOUNTER — Ambulatory Visit (HOSPITAL_COMMUNITY)
Admission: RE | Admit: 2017-10-08 | Discharge: 2017-10-08 | Disposition: A | Discharge status: Home / Self Care | Payer: Medicare Other | Source: Ambulatory Visit | Attending: Family Medicine | Admitting: Family Medicine

## 2017-10-08 ENCOUNTER — Encounter (INDEPENDENT_AMBULATORY_CARE_PROVIDER_SITE_OTHER): Payer: Medicare Other | Admitting: Ophthalmology

## 2017-10-08 DIAGNOSIS — M21961 Unspecified acquired deformity of right lower leg: Secondary | ICD-10-CM | POA: Insufficient documentation

## 2017-10-08 DIAGNOSIS — E113593 Type 2 diabetes mellitus with proliferative diabetic retinopathy without macular edema, bilateral: Secondary | ICD-10-CM

## 2017-10-08 DIAGNOSIS — H35033 Hypertensive retinopathy, bilateral: Secondary | ICD-10-CM

## 2017-10-08 DIAGNOSIS — M25571 Pain in right ankle and joints of right foot: Secondary | ICD-10-CM | POA: Diagnosis present

## 2017-10-08 DIAGNOSIS — M8580 Other specified disorders of bone density and structure, unspecified site: Secondary | ICD-10-CM | POA: Insufficient documentation

## 2017-10-08 DIAGNOSIS — W19XXXA Unspecified fall, initial encounter: Secondary | ICD-10-CM | POA: Diagnosis not present

## 2017-10-08 DIAGNOSIS — E11319 Type 2 diabetes mellitus with unspecified diabetic retinopathy without macular edema: Secondary | ICD-10-CM | POA: Diagnosis not present

## 2017-10-08 DIAGNOSIS — I10 Essential (primary) hypertension: Secondary | ICD-10-CM | POA: Diagnosis not present

## 2017-11-02 ENCOUNTER — Other Ambulatory Visit: Payer: Self-pay | Admitting: Student

## 2017-11-02 DIAGNOSIS — M25571 Pain in right ankle and joints of right foot: Secondary | ICD-10-CM

## 2017-11-09 ENCOUNTER — Ambulatory Visit
Admission: RE | Admit: 2017-11-09 | Discharge: 2017-11-09 | Disposition: A | Discharge status: Home / Self Care | Payer: Medicare Other | Source: Ambulatory Visit | Attending: Student | Admitting: Student

## 2017-11-09 DIAGNOSIS — M25571 Pain in right ankle and joints of right foot: Secondary | ICD-10-CM

## 2017-12-10 ENCOUNTER — Telehealth: Payer: Self-pay | Admitting: Family Medicine

## 2017-12-10 NOTE — Telephone Encounter (Signed)
Copied from [place] [id number]. Topic: Medical Record Request - Provider/Facility Request >> Dec 10, 2017  8:31 AM Charlynn Court wrote: Patient Name/DOB/MRN #: Joseph [name], DOB 10/23/1955, MRN [id number] Requestor Name/Agency: [facility] Call Back #: [phone]   Fax# [phone] Information Requested: 2016 lab results   Route to [place] for [place] clinics. For all other clinics, route to the clinic's PEC Pool.

## 2018-02-01 ENCOUNTER — Telehealth: Payer: Self-pay | Admitting: Internal Medicine

## 2018-02-01 NOTE — Telephone Encounter (Signed)
New message:       Pt's daughter is calling and states that the pt went and saw Dr. Horald Pollen on yesterday and was experience some chest pain. They did and EKG on him and the daughter states they were to call us on yesterday to see if the patient could be seen today. I offered the pt's daughter 11/12 @ 9:00 with [name] and she stated Dr. Dennie Fetters office said it was urgent that he be seen. Please advise.

## 2018-02-01 NOTE — Telephone Encounter (Signed)
Returned call to April (DPR) she states that PCP informed them that pt needs to be seen today. Informed April there are no appt available today, have 1 opening 11-1 and she will need to go to the ER should CP resume. She sates that PCP gave pt nitro, explained to April the directions for the nitro and informed her that she will need to take pt to the ER she it not relive CP. Appt scheduled, verbalizes understanding and will take pt to the ER should the CP return

## 2018-02-04 ENCOUNTER — Ambulatory Visit: Payer: Medicare Other | Admitting: Physician Assistant

## 2018-02-04 ENCOUNTER — Encounter: Payer: Self-pay | Admitting: Physician Assistant

## 2018-02-04 NOTE — Progress Notes (Deleted)
Cardiology Office Note   Date:  02/04/2018   ID:  DUB MACLELLAN, DOB [birth year], MRN [id number]  PCP:  Hayden Rasmussen, MD Cardiologist:  Pixie Casino, MD 05/31/2017 Rosaria Ferries, PA-C   No chief complaint on file.   History of Present Illness: Joseph [name] is a 62 y.o. male with a history of CABG 2013 w/ LIMA-LAD, SVG-Diag, SVG-OM, SVG-PDA-OM2, CKD III, DM, HTN, HLD  05/2017 office visit, exercise Myoview ordered 11/8 phone note regarding chest pain, appointment made  [name] presents for ***   Past Medical History:  Diagnosis Date  . Anemia   . Anxiety   . Arthritis   . BPH (benign prostatic hypertrophy)   . Cataract   . Chronic renal insufficiency, stage III (moderate) ([place])    [place] kidney  . Coronary artery disease    Dr Debara Pickett   . Depression   . Diabetes mellitus without complication ([place]) 6644   insulin dependent  . Gastroparesis due to DM ([place])   . GERD (gastroesophageal reflux disease)   . Hx of adenomatous colonic polyps 06/11/2014  . Hyperlipidemia   . Hypertension   . Myocardial infarction ([place])   . Neuropathy   . PVD (peripheral vascular disease) ([place])   . Sleep apnea 2010   refuses to use cpap    Past Surgical History:  Procedure Laterality Date  . ANKLE FRACTURE SURGERY    . CARDIAC CATHETERIZATION  01/30/2012   This demonstrated proximal to mid LAD disease and disease of 2 large OM vessels concerning for surgical disease.  . CORONARY ARTERY BYPASS GRAFT  02/06/2012   Procedure: CORONARY ARTERY BYPASS GRAFTING (CABG); 5- Vessel bypass a LIMA to the LAD, a saphenous vein to the to the diagonal , a saphenous vein to the distal OM, and sequential reverse saphenous vien graft to the posterior descending and 2nd OM. Surgeon: Grace Isaac, MD;  Location: [place];  Service: Open Heart Surgery;  Laterality: [name];  . ESOPHAGOGASTRODUODENOSCOPY  2005  . LEFT HEART CATHETERIZATION WITH CORONARY ANGIOGRAM [name] 01/30/2012   Procedure:  LEFT HEART CATHETERIZATION WITH CORONARY ANGIOGRAM;  Surgeon: Pixie Casino, MD;  Location: [facility] CATH LAB;  Service: Cardiovascular;  Laterality: [name];    Current Outpatient Medications  Medication Sig Dispense Refill  . aspirin 81 MG tablet Take 81 mg by mouth daily.    . cholecalciferol (VITAMIN D) 1000 UNITS tablet Take 1,000 Units by mouth daily.    . Clotrimazole-Betamethasone (LOTRISONE EX) Apply topically as needed.    . fenofibrate 160 MG tablet Take 160 mg by mouth daily.    . ferrous sulfate 325 (65 FE) MG tablet Take 325 mg by mouth 2 (two) times daily.    . furosemide (LASIX) 20 MG tablet TAKE 1 TABLET (20 MG TOTAL) BY MOUTH DAILY. PLEASE CONTACT OFFICE FOR APPOINTMENT NEED OV. 30 tablet 0  . insulin aspart (NOVOLOG) 100 UNIT/ML injection Inject 10-15 Units into the skin 3 (three) times daily before meals. Per sliding scale    . Insulin Degludec (TRESIBA FLEXTOUCH [place]) Inject 15 Units into the skin daily.    Marland Kitchen lisinopril (PRINIVIL,ZESTRIL) 20 MG tablet TAKE 1 TABLET (20 MG TOTAL) BY MOUTH DAILY. 90 tablet 3  . metoCLOPramide (REGLAN) 10 MG tablet TAKE 1 TABLET BY MOUTH THREE TIMES A DAY BEFORE MEALS 90 tablet 2  . metoprolol tartrate (LOPRESSOR) 25 MG tablet TAKE 1 TABLET (25 MG TOTAL) BY MOUTH 2 (TWO) TIMES DAILY. 60 tablet 9  .  omeprazole (PRILOSEC) 20 MG capsule Take 40 mg by mouth daily.     . pravastatin (PRAVACHOL) 40 MG tablet Take 1 tablet (40 mg total) by mouth daily. 90 tablet 3  . sertraline (ZOLOFT) 100 MG tablet Take 150 mg by mouth daily.     No current facility-administered medications for this visit.     Allergies:   Patient has no known allergies.    Social History:  The patient  reports that he has never smoked. He has never used smokeless tobacco. He reports that he does not drink alcohol or use drugs.   Family History:  The patient's family history includes Cancer in his sister; Diabetes in his father; Hypertension in his father.  He indicated that his  mother is deceased. He indicated that his father is deceased. He indicated that his sister is deceased.    ROS:  Please see the history of present illness. All other systems are reviewed and negative.    PHYSICAL EXAM: VS:  There were no vitals taken for this visit. , BMI There is no height or weight on file to calculate BMI. GEN: Well nourished, well developed, male in no acute distress HEENT: normal for age  Neck: no JVD, no carotid bruit, no masses Cardiac: RRR; no murmur, no rubs, or gallops Respiratory:  clear to auscultation bilaterally, normal work of breathing GI: soft, nontender, nondistended, + BS MS: no deformity or atrophy; no edema; distal pulses are 2+ in all 4 extremities  Skin: warm and dry, no rash Neuro:  Strength and sensation are intact Psych: euthymic mood, full affect   EKG:  EKG {ACTION; IS/IS PYK:[id number]} ordered today. The ekg ordered today demonstrates ***  ECHO: ***  CATH: ***  MYOVIEW:   Nuclear stress EF: 63%. The left ventricular ejection fraction is normal (55-65%).  The study is normal.  This is a low risk study.    Recent Labs: No results found for requested labs within last 8760 hours.  CBC    Component Value Date/Time   WBC 9.0 02/09/2012 0500   RBC 3.17 (L) 02/09/2012 0500   HGB 8.9 (L) 02/09/2012 0500   HCT 27.5 (L) 02/09/2012 0500   PLT 233 02/09/2012 0500   MCV 86.8 02/09/2012 0500   MCH 28.1 02/09/2012 0500   MCHC 32.4 02/09/2012 0500   RDW 14.4 02/09/2012 0500   LYMPHSABS 1.6 11/02/2007 2100   MONOABS 0.4 11/02/2007 2100   EOSABS 0.1 11/02/2007 2100   BASOSABS 0.0 11/02/2007 2100   CMP Latest Ref Rng & Units 03/05/2015 02/10/2012 02/09/2012  Glucose 65 - 99 mg/dL 173(H) 166(H) 197(H)  BUN 7 - 25 mg/dL 19 26(H) 21  Creatinine 0.70 - 1.33 mg/dL 1.55(H) 1.64(H) 1.66(H)  Sodium 135 - 146 mmol/L 139 136 134(L)  Potassium 3.5 - 5.3 mmol/L 4.6 3.9 4.2  Chloride 98 - 110 mmol/L 102 99 98  CO2 20 - 31 mmol/L 30 28 29     Calcium 8.6 - 10.3 mg/dL 9.2 8.6 8.5  Total Protein 6.1 - 8.1 g/dL 7.0 - -  Total Bilirubin 0.2 - 1.2 mg/dL 0.5 - -  Alkaline Phos 40 - 115 U/L 38(L) - -  AST 10 - 35 U/L 18 - -  ALT 9 - 46 U/L 14 - -     Lipid Panel    Component Value Date/Time   CHOL 93 (L) 03/05/2015 1105   CHOL 96 (L) 03/04/2014 1050   TRIG 100 03/05/2015 1105   TRIG 106 03/04/2014 1050  HDL 37 (L) 03/05/2015 1105   HDL 37 (L) 03/04/2014 1050   CHOLHDL 2.5 03/05/2015 1105   VLDL 20 03/05/2015 1105   LDLCALC 36 03/05/2015 1105   LDLCALC 38 03/04/2014 1050     Wt Readings from Last 3 Encounters:  06/05/17 203 lb (92.1 kg)  05/31/17 203 lb (92.1 kg)  03/09/16 200 lb 6.4 oz (90.9 kg)     Other studies Reviewed: Additional studies/ records that were reviewed today include: ***.  ASSESSMENT AND PLAN:  1.  ***   Current medicines are reviewed at length with the patient today.  The patient {ACTIONS; HAS/DOES NOT HAVE:19233} concerns regarding medicines.  The following changes have been made:  {PLAN; NO CHANGE:13088:s}  Labs/ tests ordered today include: *** No orders of the defined types were placed in this encounter.    Disposition:   FU with Pixie Casino, MD  Signed, Rosaria Ferries, PA-C  02/04/2018 9:31 AM    [place] Phone: [phone]; Fax: [phone]  This note was written with the assistance of speech recognition software.  Please excuse any transcriptional errors.

## 2018-02-13 ENCOUNTER — Ambulatory Visit (INDEPENDENT_AMBULATORY_CARE_PROVIDER_SITE_OTHER): Payer: Medicare Other | Admitting: Internal Medicine

## 2018-02-13 ENCOUNTER — Encounter: Payer: Self-pay | Admitting: Internal Medicine

## 2018-02-13 VITALS — BP 160/80 | HR 56 | Ht 67.0 in | Wt 205.4 lb

## 2018-02-13 DIAGNOSIS — I200 Unstable angina: Secondary | ICD-10-CM

## 2018-02-13 DIAGNOSIS — I10 Essential (primary) hypertension: Secondary | ICD-10-CM

## 2018-02-13 DIAGNOSIS — Z951 Presence of aortocoronary bypass graft: Secondary | ICD-10-CM

## 2018-02-13 DIAGNOSIS — N182 Chronic kidney disease, stage 2 (mild): Secondary | ICD-10-CM | POA: Diagnosis not present

## 2018-02-13 NOTE — Patient Instructions (Signed)
Medication Instructions:  Continue current medications If you need a refill on your cardiac medications before your next appointment, please call your pharmacy.   Lab work: NONE If you have labs (blood work) drawn today and your tests are completely normal, you will receive your results only by: Marland Kitchen MyChart Message (if you have MyChart) OR . A paper copy in the mail If you have any lab test that is abnormal or we need to change your treatment, we will call you to review the results.  Testing/Procedures: Your physician has requested that you have an echocardiogram. Echocardiography is a painless test that uses sound waves to create images of your heart. It provides your doctor with information about the size and shape of your heart and how well your heart's chambers and valves are working. This procedure takes approximately one hour. There are no restrictions for this procedure. -- done at [address] - [address] Floor  Follow-Up: At [facility], you and your health needs are our priority.  As part of our continuing mission to provide you with exceptional heart care, we have created designated Provider Care Teams.  These Care Teams include your primary Cardiologist (physician) and Advanced Practice Providers (APPs -  Physician Assistants and Nurse Practitioners) who all work together to provide you with the care you need, when you need it. You will need a follow up appointment in 6 months.  Please call our office 2 months in advance to schedule this appointment.  You may see Pixie Casino, MD or one of the following Advanced Practice Providers on your designated Care Team: [place], [place] . Fabian Sharp, PA-C  Any Other Special Instructions Will Be Listed Below (If Applicable).

## 2018-02-13 NOTE — Progress Notes (Signed)
OFFICE NOTE  Chief Complaint:  Follow-up chest pain  Primary Care Physician: Hayden Rasmussen, MD  HPI:  Joseph [name] is a 62 year old gentleman who recently underwent a MET test with a low VO2. I was concerned about multi-vessel coronary disease and then performed cardiac catheterization on him on January 30, 2012. This demonstrated proximal to mid LAD disease and disease of 2 large OM vessels concerning for surgical disease. I talked with Servando Snare and he recommended bypass surgery. He did undergo bypass surgery on February 06, 2012 with a 5-vessel bypass, a LIMA to the LAD, a saphenous vein to the diagonal, a saphenous vein to the distal OM, and sequential reverse saphenous vein graft to the posterior descending and 2nd OM. He did well with this, although it was complicated with some postoperative nausea. He has had some shortness of breath, weakness and slow recovery. He has also had some tenderness in the chest wall around the area of the sternum and incision which he was told by Dr. Servando Snare could take some time to improve. We have also made some adjustments in his medicines. He is undergoing home occupational therapy by [facility] and they are recommending more upper extremity work which I agree with.  Today he continues to complain about tightness across the chest wall. The midline incision does demonstrate a keloid scar, and this may be responsible for his feeling of tightness. It potentially is possible that the sternum was too closely approximated, however Dr. Servando Snare did not indicate the event this was a problem. Overall Joseph [name] is doing well without complaints of shortness of breath, chest pain, palpitations, or syncope. Does report some mild lower stray swelling, but his weight has generally been the same.  He is followed by Dr. Moshe Cipro for chronic kidney disease. Finally he reports has been having some problems with what sounds like tinea cruris.  He is currently taking a  topical steroid and prednisone.  Joseph [name] returns today and is without complaints. Denies shortness of breath or chest pain. He reports his kidneys have been stable. He has managed to lose a small amount of weight. He says that his diabetes is well controlled. He has not had a cholesterol test in over a year.  Joseph [name] returns for follow-up today. He reports he is feeling quite well. He denies any chest pain or shortness of breath. He is noted to sleep a lot by his significant other. He says that since he is disabled he is not as active as he once was. He reported to me that his primary care provider has stopped seeing patients enclosed the clinic. He currently is switching to a new primary care provider. His diabetes is followed by Dr. Chalmers Cater in his A1c is in the range of 5-6.  03/09/2016  Joseph [name] was seen again today in follow-up. Overall he is again without complaints. Blood pressure is well-controlled. Weight is fairly stable, in fact actually down 8 pounds. He reports pretty good diabetes control. His cholesterol is at goal. He denies any recurrent chest pain. He continues to feel tightness in his chest wall which I think is related to scar tissue, particularly a keloid scar at the chest wall incision. EKG shows sinus bradycardia 54 with moderate voltage criteria for LVH.  05/31/2017  Joseph [name] returns today for follow-up.  He is done well over the past year and a half.  He denies any chest pain or worsening shortness of breath but is not very physically active.  Weight has been fairly stable but could be improved.  Blood pressure is at goal today.  He is followed by Dr. Horald Pollen for primary care.  We recently received some lab work which was completed in September 2017.  His LDL at that time was 51 hemoglobin A1c of 6.7.  He is coronary artery bypass grafting was in 2013 and he is not had an ischemia evaluation since that time.  02/13/2018  Joseph [name] is seen today for chest  pain.  This happened a few weeks ago and he described left central chest pain which radiated to the left arm.  The episode came on rather abruptly and was more intense for 30 minutes to an hour and then gently subsided over the next 24 hours.  Since then he has had no more symptoms.  He was not seen in the ER for this.  He did not have nitroglycerin to take although saw his primary care provider recently who gave him some.  Since then he has had no further chest pain.  Blood pressure is elevated today.  EKG shows sinus bradycardia 56 with moderate voltage criteria for LVH.  EKG performed by his primary apparently did not show any ischemia.  PMHx:  Past Medical History:  Diagnosis Date  . Anemia   . Anxiety   . Arthritis   . BPH (benign prostatic hypertrophy)   . Cataract   . Chronic renal insufficiency, stage III (moderate) ([place])    [place] kidney  . Coronary artery disease    Dr Debara Pickett   . Depression   . Diabetes mellitus without complication ([place]) 3710   insulin dependent  . Gastroparesis due to DM ([place])   . GERD (gastroesophageal reflux disease)   . Hx of adenomatous colonic polyps 06/11/2014  . Hyperlipidemia   . Hypertension   . Myocardial infarction ([place])   . Neuropathy   . PVD (peripheral vascular disease) ([place])   . Sleep apnea 2010   refuses to use cpap    Past Surgical History:  Procedure Laterality Date  . ANKLE FRACTURE SURGERY    . CARDIAC CATHETERIZATION  01/30/2012   This demonstrated proximal to mid LAD disease and disease of 2 large OM vessels concerning for surgical disease.  . CORONARY ARTERY BYPASS GRAFT  02/06/2012   Procedure: CORONARY ARTERY BYPASS GRAFTING (CABG); 5- Vessel bypass a LIMA to the LAD, a saphenous vein to the to the diagonal , a saphenous vein to the distal OM, and sequential reverse saphenous vien graft to the posterior descending and 2nd OM. Surgeon: Grace Isaac, MD;  Location: [place];  Service: Open Heart Surgery;  Laterality: [name];  .  ESOPHAGOGASTRODUODENOSCOPY  2005  . LEFT HEART CATHETERIZATION WITH CORONARY ANGIOGRAM [name] 01/30/2012   Procedure: LEFT HEART CATHETERIZATION WITH CORONARY ANGIOGRAM;  Surgeon: Pixie Casino, MD;  Location: [facility] CATH LAB;  Service: Cardiovascular;  Laterality: [name];    FAMHx:  Family History  Problem Relation Age of Onset  . Diabetes Father   . Hypertension Father   . Cancer Sister     SOCHx:   reports that he has never smoked. He has never used smokeless tobacco. He reports that he does not drink alcohol or use drugs.  ALLERGIES:  No Known Allergies  ROS: Pertinent items noted in HPI and remainder of comprehensive ROS otherwise negative.  HOME MEDS: Current Outpatient Medications  Medication Sig Dispense Refill  . aspirin 81 MG tablet Take 81 mg by mouth daily.    Marland Kitchen  cholecalciferol (VITAMIN D) 1000 UNITS tablet Take 1,000 Units by mouth daily.    . fenofibrate 160 MG tablet Take 160 mg by mouth daily.    . ferrous sulfate 325 (65 FE) MG tablet Take 325 mg by mouth 2 (two) times daily.    . furosemide (LASIX) 20 MG tablet TAKE 1 TABLET (20 MG TOTAL) BY MOUTH DAILY. PLEASE CONTACT OFFICE FOR APPOINTMENT NEED OV. 30 tablet 0  . insulin aspart (NOVOLOG) 100 UNIT/ML injection Inject 10-15 Units into the skin 3 (three) times daily before meals. Per sliding scale    . Insulin Degludec (TRESIBA FLEXTOUCH [place]) Inject 15 Units into the skin daily.    Marland Kitchen lisinopril (PRINIVIL,ZESTRIL) 20 MG tablet TAKE 1 TABLET (20 MG TOTAL) BY MOUTH DAILY. 90 tablet 3  . metoCLOPramide (REGLAN) 10 MG tablet TAKE 1 TABLET BY MOUTH THREE TIMES A DAY BEFORE MEALS 90 tablet 2  . metoprolol tartrate (LOPRESSOR) 25 MG tablet TAKE 1 TABLET (25 MG TOTAL) BY MOUTH 2 (TWO) TIMES DAILY. 60 tablet 9  . omeprazole (PRILOSEC) 20 MG capsule Take 40 mg by mouth daily.     . pravastatin (PRAVACHOL) 40 MG tablet Take 1 tablet (40 mg total) by mouth daily. 90 tablet 3  . sertraline (ZOLOFT) 100 MG tablet Take 150 mg by mouth  daily.     No current facility-administered medications for this visit.     LABS/IMAGING: No results found for this or any previous visit (from the past 48 hour(s)). No results found.  VITALS: BP (!) 160/80   Pulse (!) 56   Ht 5' 7" (1.702 m)   Wt 205 lb 6.4 oz (93.2 kg)   BMI 32.17 kg/m   EXAM: General appearance: alert and no distress Neck: no adenopathy, no carotid bruit, no JVD, supple, symmetrical, trachea midline and thyroid not enlarged, symmetric, no tenderness/mass/nodules Lungs: clear to auscultation bilaterally Heart: regular rate and rhythm, S1, S2 normal, no murmur, click, rub or gallop Abdomen: soft, non-tender; bowel sounds normal; no masses,  no organomegaly Extremities: extremities normal, atraumatic, no cyanosis or edema Pulses: 2+ and symmetric Skin: Hypertrophic scar of the midline sternotomy incision Neurologic: Grossly normal  EKG: Sinus bradycardia 56, minimal voltage criteria for LVH-personally reviewed  ASSESSMENT: 1. Chest pain-possible MI 2. Coronary disease status post 5 vessel bypass in November 2013, with LIMA to LAD, SVG to diagonal, SVG to OM, SVG to posterior descending and SVG to the second OM 3. Insulin-dependent diabetes 4. Dyslipidemia 5. Hypertension 6. Obesity 7. Chronic kidney disease stage II  PLAN: 1.  His driver's had acute rest chest pain and possible MI.  Given his history of 5 vessel bypass in 2013, he could very well had a graft occlusion.  The symptoms sound like an out of hospital infarct and his symptoms have now improved.  He has had no further chest pain.  Blood pressure is elevated today.  I like to get an echocardiogram to evaluate for any new wall motion abnormalities or decrease in LV function since he has had a negative stress test earlier this year.  Pixie Casino, MD, [facility], [place] Director of the Advanced Lipid Disorders &  Cardiovascular Risk Reduction Clinic Diplomate of  the American Board of Clinical Lipidology Attending Cardiologist  Direct Dial: [phone]  Fax: [phone]  Website:  [email or web address] [email or web address] Hefty 02/13/2018, 3:01 PM

## 2018-02-18 ENCOUNTER — Ambulatory Visit (HOSPITAL_COMMUNITY): Payer: Medicare Other | Attending: Internal Medicine

## 2018-02-18 ENCOUNTER — Other Ambulatory Visit: Payer: Self-pay

## 2018-02-18 DIAGNOSIS — I200 Unstable angina: Secondary | ICD-10-CM

## 2018-02-18 DIAGNOSIS — Z951 Presence of aortocoronary bypass graft: Secondary | ICD-10-CM | POA: Diagnosis present

## 2018-02-18 MED ORDER — PERFLUTREN LIPID MICROSPHERE
1.0000 mL | INTRAVENOUS | Status: AC | PRN
  Administered 2018-02-18: 3 mL via INTRAVENOUS

## 2018-04-10 ENCOUNTER — Encounter (INDEPENDENT_AMBULATORY_CARE_PROVIDER_SITE_OTHER): Payer: Medicare Other | Admitting: Ophthalmology

## 2018-04-10 DIAGNOSIS — H43813 Vitreous degeneration, bilateral: Secondary | ICD-10-CM

## 2018-04-10 DIAGNOSIS — E113593 Type 2 diabetes mellitus with proliferative diabetic retinopathy without macular edema, bilateral: Secondary | ICD-10-CM | POA: Diagnosis not present

## 2018-04-10 DIAGNOSIS — H35033 Hypertensive retinopathy, bilateral: Secondary | ICD-10-CM | POA: Diagnosis not present

## 2018-04-10 DIAGNOSIS — I10 Essential (primary) hypertension: Secondary | ICD-10-CM | POA: Diagnosis not present

## 2018-04-10 DIAGNOSIS — E11319 Type 2 diabetes mellitus with unspecified diabetic retinopathy without macular edema: Secondary | ICD-10-CM | POA: Diagnosis not present

## 2018-06-06 ENCOUNTER — Other Ambulatory Visit: Payer: Self-pay | Admitting: Family Medicine

## 2018-06-06 DIAGNOSIS — R1031 Right lower quadrant pain: Secondary | ICD-10-CM

## 2018-06-07 ENCOUNTER — Ambulatory Visit
Admission: RE | Admit: 2018-06-07 | Discharge: 2018-06-07 | Disposition: A | Discharge status: Home / Self Care | Payer: Medicare Other | Source: Ambulatory Visit | Attending: Family Medicine | Admitting: Family Medicine

## 2018-06-07 DIAGNOSIS — R1031 Right lower quadrant pain: Secondary | ICD-10-CM

## 2018-06-07 MED ORDER — IOPAMIDOL (ISOVUE-300) INJECTION 61%
100.0000 mL | Freq: Once | INTRAVENOUS | Status: AC | PRN
  Administered 2018-06-07: 100 mL via INTRAVENOUS

## 2018-06-25 ENCOUNTER — Other Ambulatory Visit: Payer: Self-pay | Admitting: Internal Medicine

## 2018-08-06 ENCOUNTER — Telehealth: Payer: Self-pay | Admitting: Internal Medicine

## 2018-08-06 NOTE — Telephone Encounter (Signed)
LMTCB to switch appt to virtual visit with Dr. Debara Pickett.

## 2018-08-12 ENCOUNTER — Telehealth: Payer: Self-pay | Admitting: Internal Medicine

## 2018-08-13 ENCOUNTER — Telehealth (INDEPENDENT_AMBULATORY_CARE_PROVIDER_SITE_OTHER): Payer: Medicare Other | Admitting: Internal Medicine

## 2018-08-13 ENCOUNTER — Encounter: Payer: Self-pay | Admitting: Internal Medicine

## 2018-08-13 VITALS — BP 126/78 | HR 66 | Ht 67.0 in | Wt 202.4 lb

## 2018-08-13 DIAGNOSIS — E785 Hyperlipidemia, unspecified: Secondary | ICD-10-CM

## 2018-08-13 DIAGNOSIS — I10 Essential (primary) hypertension: Secondary | ICD-10-CM

## 2018-08-13 DIAGNOSIS — I2510 Atherosclerotic heart disease of native coronary artery without angina pectoris: Secondary | ICD-10-CM

## 2018-08-13 DIAGNOSIS — Z7189 Other specified counseling: Secondary | ICD-10-CM | POA: Diagnosis not present

## 2018-08-13 DIAGNOSIS — Z951 Presence of aortocoronary bypass graft: Secondary | ICD-10-CM

## 2018-08-13 DIAGNOSIS — N182 Chronic kidney disease, stage 2 (mild): Secondary | ICD-10-CM | POA: Diagnosis not present
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
## 2018-08-13 NOTE — Patient Instructions (Signed)
Medication Instructions:  Your physician recommends that you continue on your current medications as directed. Please refer to the Current Medication list given to you today.  If you need a refill on your cardiac medications before your next appointment, please call your pharmacy.   Follow-Up: At [facility], you and your health needs are our priority.  As part of our continuing mission to provide you with exceptional heart care, we have created designated Provider Care Teams.  These Care Teams include your primary Cardiologist (physician) and Advanced Practice Providers (APPs -  Physician Assistants and Nurse Practitioners) who all work together to provide you with the care you need, when you need it. You will need a follow up appointment in 6 months.  Please call our office 2 months in advance to schedule this appointment.  You may see [name] [name], MD or one of the following Advanced Practice Providers on your designated Care Team: Hao Meng, PA-C . Angela Duke, PA-C  Any Other Special Instructions Will Be Listed Below (If Applicable).    

## 2018-09-24 NOTE — Telephone Encounter (Signed)
Opened in error

## 2018-10-11 ENCOUNTER — Encounter (INDEPENDENT_AMBULATORY_CARE_PROVIDER_SITE_OTHER): Payer: Medicare Other | Admitting: Ophthalmology

## 2018-10-11 ENCOUNTER — Other Ambulatory Visit: Payer: Self-pay

## 2018-10-11 DIAGNOSIS — H35033 Hypertensive retinopathy, bilateral: Secondary | ICD-10-CM | POA: Diagnosis not present

## 2018-10-11 DIAGNOSIS — E113593 Type 2 diabetes mellitus with proliferative diabetic retinopathy without macular edema, bilateral: Secondary | ICD-10-CM | POA: Diagnosis not present

## 2018-10-11 DIAGNOSIS — I10 Essential (primary) hypertension: Secondary | ICD-10-CM | POA: Diagnosis not present

## 2018-10-11 DIAGNOSIS — E11319 Type 2 diabetes mellitus with unspecified diabetic retinopathy without macular edema: Secondary | ICD-10-CM | POA: Diagnosis not present

## 2019-01-16 DIAGNOSIS — G8929 Other chronic pain: Secondary | ICD-10-CM | POA: Insufficient documentation

## 2019-01-16 DIAGNOSIS — M545 Low back pain, unspecified: Secondary | ICD-10-CM | POA: Insufficient documentation

## 2019-01-16 DIAGNOSIS — N400 Enlarged prostate without lower urinary tract symptoms: Secondary | ICD-10-CM | POA: Insufficient documentation

## 2019-01-16 DIAGNOSIS — D509 Iron deficiency anemia, unspecified: Secondary | ICD-10-CM | POA: Insufficient documentation

## 2019-01-22 ENCOUNTER — Encounter: Payer: Self-pay | Admitting: Nurse Practitioner

## 2019-01-22 ENCOUNTER — Other Ambulatory Visit (INDEPENDENT_AMBULATORY_CARE_PROVIDER_SITE_OTHER): Payer: Medicare Other

## 2019-01-22 ENCOUNTER — Ambulatory Visit (INDEPENDENT_AMBULATORY_CARE_PROVIDER_SITE_OTHER): Payer: Medicare Other | Admitting: Nurse Practitioner

## 2019-01-22 ENCOUNTER — Other Ambulatory Visit: Payer: Self-pay

## 2019-01-22 VITALS — BP 120/68 | HR 58 | Temp 98.1°F | Ht 67.0 in | Wt 202.0 lb

## 2019-01-22 DIAGNOSIS — R1084 Generalized abdominal pain: Secondary | ICD-10-CM | POA: Diagnosis not present

## 2019-01-22 DIAGNOSIS — I2510 Atherosclerotic heart disease of native coronary artery without angina pectoris: Secondary | ICD-10-CM

## 2019-01-22 LAB — URINALYSIS
Bilirubin Urine: NEGATIVE
Hgb urine dipstick: NEGATIVE
Ketones, ur: NEGATIVE
Leukocytes,Ua: NEGATIVE
Nitrite: NEGATIVE
Specific Gravity, Urine: 1.02 (ref 1.000–1.030)
Urine Glucose: NEGATIVE
Urobilinogen, UA: 2 — AB (ref 0.0–1.0)
pH: 6 (ref 5.0–8.0)

## 2019-01-22 LAB — CBC
HCT: 35.4 % — ABNORMAL LOW (ref 39.0–52.0)
Hemoglobin: 12 g/dL — ABNORMAL LOW (ref 13.0–17.0)
MCHC: 33.9 g/dL (ref 30.0–36.0)
MCV: 86.6 fl (ref 78.0–100.0)
Platelets: 245 10*3/uL (ref 150.0–400.0)
RBC: 4.08 Mil/uL — ABNORMAL LOW (ref 4.22–5.81)
RDW: 14.3 % (ref 11.5–15.5)
WBC: 6.1 10*3/uL (ref 4.0–10.5)

## 2019-01-22 MED ORDER — DICYCLOMINE HCL 10 MG PO CAPS: 10.0000 mg | ORAL_CAPSULE | Freq: Two times a day (BID) | ORAL | 1 refills | Status: DC

## 2019-01-22 NOTE — Patient Instructions (Signed)
If you are age 63 or older, your body mass index should be between 23-30. Your Body mass index is 31.64 kg/m. If this is out of the aforementioned range listed, please consider follow up with your Primary Care Provider.  If you are age 67 or younger, your body mass index should be between 19-25. Your Body mass index is 31.64 kg/m. If this is out of the aformentioned range listed, please consider follow up with your Primary Care Provider.   Your provider has requested that you go to the basement level for lab work before leaving today. Press "B" on the elevator. The lab is located at the first door on the left as you exit the elevator.  We have sent the following medications to your pharmacy for you to pick up at your convenience: Bentyl 10 mg If better then continue until next appointment.  Stop medication if not helping.  If getting worse before next appointment, call us back.  Follow up with Dr. Carlean Purl on 03/11/19 at 11:10 am.  Thank you for choosing me and [facility].   Tye Savoy, NP

## 2019-01-22 NOTE — Progress Notes (Signed)
Chief Complaint:    Abdominal pain  IMPRESSION and PLAN:    1. 63 yo male with two week history of intermittent diffuse mid abdominal discomfort unrelated to BMs (normal), eating or physical activity. Abdominal exam benign. CT AP in March of this year for similar discomfort was unremarkable. Etiology of discomfort unclear at this point.  -trial of Bentyl bid for 14 days -check U/A -CBC -follow up in 4-6 week, sooner if worsening symptoms.   2. Chronic normocytic anemia, likely secondary to CKD. Last CBC in Epic was in 2013 -CBC today. He takes iron  3. History of adenomatous colon polyps.  Due for 5-year recall colonoscopy in 2021.    HPI:     Patient is a 63 year old male with PMH significant for CKD 3, CAD, hypertension , BPH,  DM, gastroparesis (on chronic Reglan), GERD, history of adenomatous colon polyps, and chronic anemia.  He had 2 small adenomas at time of last colonoscopy March 2016, due for 5-year recall colonoscopy.  Patient is with his daughter.  He gives a 2-week history of generalized mid abdominal discomfort.  Discomfort is not severe. It is intermittent, episodes may last an hour or so. The discomfort comes and goes throughout the day unrelated to eating, physical activity or bowel movements which are normal.  Only one episode of nausea with the discomfort. No urinary symptoms.  No fever.  Appetite is fine. He had this pain " a long time ago", cannot be specific. Had CT AP in March for RLQ pain and says current pain is similar to then.   Data Reviewed:   CTAP with contrast 06/07/2018 for evaluation of RLQ pain.  No acute findings  Review of systems:     No chest pain, no SOB, no fevers, no urinary sx   Past Medical History:  Diagnosis Date  . Anemia   . Anxiety   . Arthritis   . BPH (benign prostatic hypertrophy)   . Cataract   . Chronic renal insufficiency, stage III (moderate)    [place] kidney  . Coronary artery disease    Dr Debara Pickett   .  Depression   . Diabetes mellitus without complication ([place]) [id number]   insulin dependent  . Gastroparesis due to DM ([place])   . GERD (gastroesophageal reflux disease)   . Hx of adenomatous colonic polyps 06/11/2014  . Hyperlipidemia   . Hypertension   . Myocardial infarction ([place])   . Neuropathy   . PVD (peripheral vascular disease) ([place])   . Sleep apnea 2010   refuses to use cpap    Patient's surgical history, family medical history, social history, medications and allergies were all reviewed in Epic    Current Outpatient Medications  Medication Sig Dispense Refill  . aspirin 81 MG tablet Take 81 mg by mouth daily.    . cholecalciferol (VITAMIN D) 1000 UNITS tablet Take 1,000 Units by mouth daily.    . fenofibrate 160 MG tablet Take 160 mg by mouth daily.    . ferrous sulfate 325 (65 FE) MG tablet Take 325 mg by mouth 2 (two) times daily.    . furosemide (LASIX) 20 MG tablet TAKE 1 TABLET (20 MG TOTAL) BY MOUTH DAILY. PLEASE CONTACT OFFICE FOR APPOINTMENT NEED OV. 30 tablet 0  . Insulin Degludec (TRESIBA FLEXTOUCH [place]) Inject 20 Units into the skin daily.     . insulin lispro (HUMALOG) 100 UNIT/ML KwikPen 10-15 Units 3 (three) times daily.     [place] lisinopril (  PRINIVIL,ZESTRIL) 20 MG tablet TAKE 1 TABLET (20 MG TOTAL) BY MOUTH DAILY. 90 tablet 3  . metoCLOPramide (REGLAN) 10 MG tablet TAKE 1 TABLET BY MOUTH THREE TIMES A DAY BEFORE MEALS 90 tablet 2  . metoprolol tartrate (LOPRESSOR) 25 MG tablet TAKE 1 TABLET (25 MG TOTAL) BY MOUTH 2 (TWO) TIMES DAILY. 60 tablet 9  . montelukast (SINGULAIR) 10 MG tablet Take 1 tablet by mouth daily.    [place] Kitchen omeprazole (PRILOSEC) 20 MG capsule Take 40 mg by mouth daily.     . pravastatin (PRAVACHOL) 40 MG tablet TAKE 1 TABLET BY MOUTH EVERY DAY 90 tablet 2  . sertraline (ZOLOFT) 100 MG tablet Take 150 mg by mouth daily.    . SYMBICORT 160-4.5 MCG/ACT inhaler Inhale 1 puff into the lungs 2 (two) times a day.     No current facility-administered medications for  this visit.     Physical Exam:     BP 120/68   Pulse (!) 58   Temp 98.1 F (36.7 C)   Ht 5\' 7"  (1.702 m)   Wt 202 lb (91.6 kg)   BMI 31.64 kg/m   GENERAL:  Pleasant male in NAD PSYCH: : Cooperative, normal affect EENT:  conjunctiva pink, mucous membranes moist, neck supple without masses CARDIAC:  RRR, murmur heard, no peripheral edema PULM: Normal respiratory effort, lungs CTA bilaterally, no wheezing ABDOMEN:  Nondistended, soft, nontender. No obvious masses, no hepatomegaly,  normal bowel sounds SKIN:  turgor, no lesions seen Musculoskeletal:  Normal muscle tone, normal strength NEURO: Alert and oriented x 3, no focal neurologic deficits   Tye Savoy , NP 01/22/2019, 11:31 AM

## 2019-01-23 ENCOUNTER — Ambulatory Visit (INDEPENDENT_AMBULATORY_CARE_PROVIDER_SITE_OTHER): Payer: Medicare Other | Admitting: Otolaryngology

## 2019-01-23 DIAGNOSIS — H9209 Otalgia, unspecified ear: Secondary | ICD-10-CM

## 2019-01-23 DIAGNOSIS — H903 Sensorineural hearing loss, bilateral: Secondary | ICD-10-CM

## 2019-02-13 ENCOUNTER — Ambulatory Visit (INDEPENDENT_AMBULATORY_CARE_PROVIDER_SITE_OTHER): Payer: Medicare Other | Admitting: Internal Medicine

## 2019-02-13 ENCOUNTER — Encounter: Payer: Self-pay | Admitting: Internal Medicine

## 2019-02-13 ENCOUNTER — Other Ambulatory Visit: Payer: Self-pay

## 2019-02-13 VITALS — BP 194/73 | HR 58 | Temp 96.9°F | Ht 65.0 in | Wt 201.0 lb

## 2019-02-13 DIAGNOSIS — N182 Chronic kidney disease, stage 2 (mild): Secondary | ICD-10-CM | POA: Diagnosis not present

## 2019-02-13 DIAGNOSIS — I2510 Atherosclerotic heart disease of native coronary artery without angina pectoris: Secondary | ICD-10-CM

## 2019-02-13 DIAGNOSIS — Z951 Presence of aortocoronary bypass graft: Secondary | ICD-10-CM | POA: Diagnosis not present

## 2019-02-13 DIAGNOSIS — E785 Hyperlipidemia, unspecified: Secondary | ICD-10-CM | POA: Diagnosis not present

## 2019-02-13 DIAGNOSIS — I10 Essential (primary) hypertension: Secondary | ICD-10-CM

## 2019-02-13 NOTE — Patient Instructions (Signed)
Medication Instructions:  Your physician recommends that you continue on your current medications as directed. Please refer to the Current Medication list given to you today.  *If you need a refill on your cardiac medications before your next appointment, please call your pharmacy*  Lab Work: FASTING lab work to check cholesterol If you have labs (blood work) drawn today and your tests are completely normal, you will receive your results only by: [place] Kitchen MyChart Message (if you have MyChart) OR . A paper copy in the mail If you have any lab test that is abnormal or we need to change your treatment, we will call you to review the results.  Testing/Procedures: NONE  Follow-Up: At [facility], you and your health needs are our priority.  As part of our continuing mission to provide you with exceptional heart care, we have created designated Provider Care Teams.  These Care Teams include your primary Cardiologist (physician) and Advanced Practice Providers (APPs -  Physician Assistants and Nurse Practitioners) who all work together to provide you with the care you need, when you need it.  Your next appointment:   6 month(s)  The format for your next appointment:   In Person  Provider:   You may see Pixie Casino, MD or one of the following Advanced Practice Providers on your designated Care Team:    Almyra Deforest, PA-C  Fabian Sharp, PA-C or   Roby Lofts, [place]   Other Instructions

## 2019-02-13 NOTE — Progress Notes (Signed)
OFFICE NOTE  Chief Complaint:  Follow-up  Primary Care Physician: Sandi Mealy, MD  HPI:  Joseph [name] is a 63 year old gentleman who recently underwent a MET test with a low VO2. I was concerned about multi-vessel coronary disease and then performed cardiac catheterization on him on January 30, 2012. This demonstrated proximal to mid LAD disease and disease of 2 large OM vessels concerning for surgical disease. I talked with Servando Snare and he recommended bypass surgery. He did undergo bypass surgery on February 06, 2012 with a 5-vessel bypass, a LIMA to the LAD, a saphenous vein to the diagonal, a saphenous vein to the distal OM, and sequential reverse saphenous vein graft to the posterior descending and 2nd OM. He did well with this, although it was complicated with some postoperative nausea. He has had some shortness of breath, weakness and slow recovery. He has also had some tenderness in the chest wall around the area of the sternum and incision which he was told by Dr. Servando Snare could take some time to improve. We have also made some adjustments in his medicines. He is undergoing home occupational therapy by [facility] and they are recommending more upper extremity work which I agree with.  Today he continues to complain about tightness across the chest wall. The midline incision does demonstrate a keloid scar, and this may be responsible for his feeling of tightness. It potentially is possible that the sternum was too closely approximated, however Dr. Servando Snare did not indicate the event this was a problem. Overall Joseph [name] is doing well without complaints of shortness of breath, chest pain, palpitations, or syncope. Does report some mild lower stray swelling, but his weight has generally been the same.  He is followed by Dr. Moshe Cipro for chronic kidney disease. Finally he reports has been having some problems with what sounds like tinea cruris.  He is currently taking a topical steroid  and prednisone.  Joseph [name] returns today and is without complaints. Denies shortness of breath or chest pain. He reports his kidneys have been stable. He has managed to lose a small amount of weight. He says that his diabetes is well controlled. He has not had a cholesterol test in over a year.  Joseph [name] returns for follow-up today. He reports he is feeling quite well. He denies any chest pain or shortness of breath. He is noted to sleep a lot by his significant other. He says that since he is disabled he is not as active as he once was. He reported to me that his primary care provider has stopped seeing patients enclosed the clinic. He currently is switching to a new primary care provider. His diabetes is followed by Dr. Chalmers Cater in his A1c is in the range of 5-6.  03/09/2016  Joseph [name] was seen again today in follow-up. Overall he is again without complaints. Blood pressure is well-controlled. Weight is fairly stable, in fact actually down 8 pounds. He reports pretty good diabetes control. His cholesterol is at goal. He denies any recurrent chest pain. He continues to feel tightness in his chest wall which I think is related to scar tissue, particularly a keloid scar at the chest wall incision. EKG shows sinus bradycardia 54 with moderate voltage criteria for LVH.  05/31/2017  Joseph [name] returns today for follow-up.  He is done well over the past year and a half.  He denies any chest pain or worsening shortness of breath but is not very physically active.  Weight has  been fairly stable but could be improved.  Blood pressure is at goal today.  He is followed by Dr. Horald Pollen for primary care.  We recently received some lab work which was completed in September 2017.  His LDL at that time was 51 hemoglobin A1c of 6.7.  He is coronary artery bypass grafting was in 2013 and he is not had an ischemia evaluation since that time.  02/13/2018  Joseph [name] is seen today for chest pain.  This happened  a few weeks ago and he described left central chest pain which radiated to the left arm.  The episode came on rather abruptly and was more intense for 30 minutes to an hour and then gently subsided over the next 24 hours.  Since then he has had no more symptoms.  He was not seen in the ER for this.  He did not have nitroglycerin to take although saw his primary care provider recently who gave him some.  Since then he has had no further chest pain.  Blood pressure is elevated today.  EKG shows sinus bradycardia 56 with moderate voltage criteria for LVH.  EKG performed by his primary apparently did not show any ischemia.  02/13/2019  Joseph [name] returns today for follow-up.  I saw him about 6 months ago via virtual visit.  Blood pressure was well controlled.  Recently saw his GI doctor again with good blood pressure control however today in the office it was 194/73.  He says he might have been anxious and did report taking his medicines today.  EKG shows a sinus bradycardia.  He is asymptomatic he denies any chest pain or worsening shortness of breath.  He had labs through his PCP which indicates good control over his diabetes with hemoglobin A1c of 5.7.  He is managed to lose some more weight.  His cholesterol has been at target.  PMHx:  Past Medical History:  Diagnosis Date   Anemia    Anxiety    Arthritis    BPH (benign prostatic hypertrophy)    Cataract    Chronic renal insufficiency, stage III (moderate)    [facility]   Coronary artery disease    Dr Debara Pickett    Depression    Diabetes mellitus without complication ([place]) 4709   insulin dependent   Gastroparesis due to DM ([place])    GERD (gastroesophageal reflux disease)    Hx of adenomatous colonic polyps 06/11/2014   Hyperlipidemia    Hypertension    Myocardial infarction ([place])    Neuropathy    PVD (peripheral vascular disease) ([place])    Sleep apnea 2010   refuses to use cpap    Past Surgical History:  Procedure  Laterality Date   ANKLE FRACTURE SURGERY     CARDIAC CATHETERIZATION  01/30/2012   This demonstrated proximal to mid LAD disease and disease of 2 large OM vessels concerning for surgical disease.   CORONARY ARTERY BYPASS GRAFT  02/06/2012   Procedure: CORONARY ARTERY BYPASS GRAFTING (CABG); 5- Vessel bypass a LIMA to the LAD, a saphenous vein to the to the diagonal , a saphenous vein to the distal OM, and sequential reverse saphenous vien graft to the posterior descending and 2nd OM. Surgeon: Grace Isaac, MD;  Location: [facility];  Service: Open Heart Surgery;  Laterality: [name];   ESOPHAGOGASTRODUODENOSCOPY  2005   LEFT HEART CATHETERIZATION WITH CORONARY ANGIOGRAM [name] 01/30/2012   Procedure: LEFT HEART CATHETERIZATION WITH CORONARY ANGIOGRAM;  Surgeon: Pixie Casino, MD;  Location: [facility] CATH LAB;  Service: Cardiovascular;  Laterality: [name];    FAMHx:  Family History  Problem Relation Age of Onset   Diabetes Father    Hypertension Father    Cancer Sister     SOCHx:   reports that he has never smoked. He has never used smokeless tobacco. He reports that he does not drink alcohol or use drugs.  ALLERGIES:  No Known Allergies  ROS: Pertinent items noted in HPI and remainder of comprehensive ROS otherwise negative.  HOME MEDS: Current Outpatient Medications  Medication Sig Dispense Refill   aspirin 81 MG tablet Take 81 mg by mouth daily.     cholecalciferol (VITAMIN D) 1000 UNITS tablet Take 1,000 Units by mouth daily.     dicyclomine (BENTYL) 10 MG capsule Take 1 capsule (10 mg total) by mouth 2 (two) times daily. 28 capsule 1   fenofibrate 160 MG tablet Take 160 mg by mouth daily.     ferrous sulfate 325 (65 FE) MG tablet Take 325 mg by mouth 2 (two) times daily.     furosemide (LASIX) 20 MG tablet TAKE 1 TABLET (20 MG TOTAL) BY MOUTH DAILY. PLEASE CONTACT OFFICE FOR APPOINTMENT NEED OV. 30 tablet 0   Insulin Degludec (TRESIBA FLEXTOUCH [place]) Inject 20 Units into the  skin daily.      insulin lispro (HUMALOG) 100 UNIT/ML KwikPen 10-15 Units 3 (three) times daily.      lisinopril (PRINIVIL,ZESTRIL) 20 MG tablet TAKE 1 TABLET (20 MG TOTAL) BY MOUTH DAILY. 90 tablet 3   metoCLOPramide (REGLAN) 10 MG tablet TAKE 1 TABLET BY MOUTH THREE TIMES A DAY BEFORE MEALS 90 tablet 2   metoprolol tartrate (LOPRESSOR) 25 MG tablet TAKE 1 TABLET (25 MG TOTAL) BY MOUTH 2 (TWO) TIMES DAILY. 60 tablet 9   montelukast (SINGULAIR) 10 MG tablet Take 1 tablet by mouth daily.     omeprazole (PRILOSEC) 20 MG capsule Take 40 mg by mouth daily.      pravastatin (PRAVACHOL) 40 MG tablet TAKE 1 TABLET BY MOUTH EVERY DAY 90 tablet 2   sertraline (ZOLOFT) 100 MG tablet Take 150 mg by mouth daily.     SYMBICORT 160-4.5 MCG/ACT inhaler Inhale 1 puff into the lungs 2 (two) times a day.     No current facility-administered medications for this visit.     LABS/IMAGING: No results found for this or any previous visit (from the past 48 hour(s)). No results found.  VITALS: BP (!) 194/73    Pulse (!) 58    Temp (!) 96.9 F (36.1 C)    Ht _0  (1.651 m)    Wt 201 lb (91.2 kg)    SpO2 95%    BMI 33.45 kg/m   EXAM: General appearance: alert and no distress Neck: no adenopathy, no carotid bruit, no JVD, supple, symmetrical, trachea midline and thyroid not enlarged, symmetric, no tenderness/mass/nodules Lungs: clear to auscultation bilaterally Heart: regular rate and rhythm, S1, S2 normal, no murmur, click, rub or gallop Abdomen: soft, non-tender; bowel sounds normal; no masses,  no organomegaly Extremities: extremities normal, atraumatic, no cyanosis or edema Pulses: 2+ and symmetric Skin: Hypertrophic scar of the midline sternotomy incision Neurologic: Grossly normal  EKG: Sinus bradycardia 58, inferior T wave changes-personally reviewed  ASSESSMENT: 1. Coronary disease status post 5 vessel bypass in November 2013, with LIMA to LAD, SVG to diagonal, SVG to OM, SVG to posterior  descending and SVG to the second OM 2. Insulin-dependent diabetes 3. Dyslipidemia 4. Hypertension  5. Obesity 6. Chronic kidney disease stage II  PLAN: 1.  Mr. Mccook is doing well and asymptomatic.  His blood sugars are better controlled with A1c less than 6.  His cholesterol is at goal.  His blood pressure was elevated today.  He says is much better at home.  We will need to continue to monitor that and contact us if it persistently is elevated over 087 systolic.  No other changes to his medicines today.  Plan follow-up with me annually or sooner as necessary.  Pixie Casino, MD, [facility], [place] Director of the Advanced Lipid Disorders &  Cardiovascular Risk Reduction Clinic Diplomate of the American Board of Clinical Lipidology Attending Cardiologist  Direct Dial: [phone]   Fax: [phone]  Website:  [email or web address] 02/13/2019, 11:06 AM

## 2019-02-15 LAB — LIPID PANEL
Chol/HDL Ratio: 3.7 ratio (ref 0.0–5.0)
Cholesterol, Total: 119 mg/dL (ref 100–199)
HDL: 32 mg/dL — ABNORMAL LOW (ref >39)
LDL Chol Calc (NIH): 63 mg/dL (ref 0–99)
Triglycerides: 138 mg/dL (ref 0–149)
VLDL Cholesterol Cal: 24 mg/dL (ref 5–40)

## 2019-02-18 ENCOUNTER — Encounter: Payer: Self-pay | Admitting: Internal Medicine

## 2019-02-24 ENCOUNTER — Telehealth: Payer: Self-pay | Admitting: Internal Medicine

## 2019-02-24 NOTE — Telephone Encounter (Signed)
Patient's daughter called for lab results.

## 2019-02-24 NOTE — Telephone Encounter (Signed)
Called patient daughter- advised letter was mailed out, no changes. Patient daughter verbalized understanding.

## 2019-03-11 ENCOUNTER — Encounter: Payer: Self-pay | Admitting: Internal Medicine

## 2019-03-11 ENCOUNTER — Ambulatory Visit (INDEPENDENT_AMBULATORY_CARE_PROVIDER_SITE_OTHER): Payer: Medicare Other | Admitting: Internal Medicine

## 2019-03-11 VITALS — BP 154/68 | HR 60 | Temp 98.3°F | Ht 65.0 in | Wt 201.2 lb

## 2019-03-11 DIAGNOSIS — E1143 Type 2 diabetes mellitus with diabetic autonomic (poly)neuropathy: Secondary | ICD-10-CM | POA: Diagnosis not present

## 2019-03-11 DIAGNOSIS — K3184 Gastroparesis: Secondary | ICD-10-CM

## 2019-03-11 DIAGNOSIS — Z86010 Personal history of colonic polyps: Secondary | ICD-10-CM | POA: Diagnosis not present

## 2019-03-11 DIAGNOSIS — R1013 Epigastric pain: Secondary | ICD-10-CM

## 2019-03-11 DIAGNOSIS — I2510 Atherosclerotic heart disease of native coronary artery without angina pectoris: Secondary | ICD-10-CM | POA: Diagnosis not present

## 2019-03-11 DIAGNOSIS — R112 Nausea with vomiting, unspecified: Secondary | ICD-10-CM

## 2019-03-11 DIAGNOSIS — Z1159 Encounter for screening for other viral diseases: Secondary | ICD-10-CM

## 2019-03-11 NOTE — Progress Notes (Signed)
Joseph [name] 63 y.o. 04-10-1955 [id number]  Assessment & Plan:   Encounter Diagnoses  Name Primary?  . Abdominal pain, epigastric Yes  . Intractable vomiting with nausea, unspecified vomiting type   . Diabetic gastroparesis ([place])   . Hx of adenomatous colonic polyps   . Special screening examination for viral disease     Not responding to metaclopramide well, om PPI also Evaluate with EGD Stop dicyclomine not helping and may delay emptying Change colon to 2023 no lower sxs  Subjective:   Chief Complaint: epigastric pain  HPI 14 man w/ DM and gastroaparesis - has seen NP Joseph [name] - on metaclopramide and dicyclomine but though better still with pain and some vomiting (less but at times)  Neg CT Abd/pelvis 05/2018  Last EGD 2005 Allergies  Allergen Reactions  . Robaxin [Methocarbamol] Itching and Rash   Current Meds  Medication Sig  . aspirin 81 MG tablet Take 81 mg by mouth daily.  . cholecalciferol (VITAMIN D) 1000 UNITS tablet Take 1,000 Units by mouth daily.  . cyclobenzaprine (FLEXERIL) 10 MG tablet Take 10 mg by mouth at bedtime.  . dicyclomine (BENTYL) 10 MG capsule Take 1 capsule (10 mg total) by mouth 2 (two) times daily.  . ergocalciferol (VITAMIN D2) 1.25 MG (50000 UT) capsule Take 1 capsule by mouth once a week.  . fenofibrate 160 MG tablet Take 160 mg by mouth daily.  . ferrous sulfate 325 (65 FE) MG tablet Take 325 mg by mouth 2 (two) times daily.  . furosemide (LASIX) 20 MG tablet TAKE 1 TABLET (20 MG TOTAL) BY MOUTH DAILY. PLEASE CONTACT OFFICE FOR APPOINTMENT NEED OV.  . Insulin Degludec (TRESIBA FLEXTOUCH [place]) Inject 20 Units into the skin daily.   Marland Kitchen lisinopril (PRINIVIL,ZESTRIL) 20 MG tablet TAKE 1 TABLET (20 MG TOTAL) BY MOUTH DAILY.  Marland Kitchen metoCLOPramide (REGLAN) 10 MG tablet TAKE 1 TABLET BY MOUTH THREE TIMES A DAY BEFORE MEALS  . metoprolol tartrate (LOPRESSOR) 25 MG tablet TAKE 1 TABLET (25 MG TOTAL) BY MOUTH 2 (TWO) TIMES DAILY.  . montelukast  (SINGULAIR) 10 MG tablet Take 1 tablet by mouth daily.  Marland Kitchen NOVOLOG FLEXPEN 100 UNIT/ML FlexPen Inject 17-20 Units into the skin 3 (three) times daily.  Marland Kitchen omeprazole (PRILOSEC) 20 MG capsule Take 40 mg by mouth daily.   . ondansetron (ZOFRAN-ODT) 4 MG disintegrating tablet Take 1 tablet by mouth every 8 (eight) hours as needed.  . pravastatin (PRAVACHOL) 40 MG tablet TAKE 1 TABLET BY MOUTH EVERY DAY  . sertraline (ZOLOFT) 100 MG tablet Take 150 mg by mouth daily.  . SYMBICORT 160-4.5 MCG/ACT inhaler Inhale 1 puff into the lungs 2 (two) times a day.   Past Medical History:  Diagnosis Date  . Anemia   . Anxiety   . Arthritis   . BPH (benign prostatic hypertrophy)   . Cataract   . Chronic renal insufficiency, stage III (moderate)    [facility]  . Coronary artery disease    Dr Joseph [name]   . Depression   . Diabetes mellitus without complication ([place]) [id number]   insulin dependent  . Gastroparesis due to DM ([place])   . GERD (gastroesophageal reflux disease)   . Hx of adenomatous colonic polyps 06/11/2014  . Hyperlipidemia   . Hypertension   . Myocardial infarction ([place])   . Neuropathy   . PVD (peripheral vascular disease) ([place])   . Sleep apnea 2010   refuses to use cpap   Past Surgical History:  Procedure Laterality Date  .  ANKLE FRACTURE SURGERY    . CARDIAC CATHETERIZATION  01/30/2012   This demonstrated proximal to mid LAD disease and disease of 2 large OM vessels concerning for surgical disease.  . CORONARY ARTERY BYPASS GRAFT  02/06/2012   Procedure: CORONARY ARTERY BYPASS GRAFTING (CABG); 5- Vessel bypass a LIMA to the LAD, a saphenous vein to the to the diagonal , a saphenous vein to the distal OM, and sequential reverse saphenous vien graft to the posterior descending and 2nd OM. Surgeon: Joseph Isaac, MD;  Location: [facility];  Service: Open Heart Surgery;  Laterality: [name];  . ESOPHAGOGASTRODUODENOSCOPY  2005  . LEFT HEART CATHETERIZATION WITH CORONARY ANGIOGRAM [name] 01/30/2012    Procedure: LEFT HEART CATHETERIZATION WITH CORONARY ANGIOGRAM;  Surgeon: Joseph Casino, MD;  Location: [facility] CATH LAB;  Service: Cardiovascular;  Laterality: [name];   Social History   Social History Narrative   Widowed, 1 son and 4 daughters   Disabled and dual eligible Medicare and Medicaid   One caffeinated beverage daily   family history includes Cancer in his sister; Diabetes in his father; Hypertension in his father.   Review of Systems  As per HPI Objective:   Physical Exam @BP  (!) 154/68 (BP Location: Left Arm, Patient Position: Sitting, Cuff Size: Normal)   Pulse 60   Temp 98.3 F (36.8 C)   Ht 5\' 5"  (1.651 m)   Wt 201 lb 4 oz (91.3 kg)   BMI 33.49 kg/m @  General:  NAD Eyes:   anicteric Lungs:  clear Heart::  S1S2 no rubs, murmurs or gallops Abdomen:  soft and nontender, BS+ no splash Ext:   no edema, cyanosis or clubbing    Data Reviewed:  See HPI

## 2019-03-11 NOTE — Patient Instructions (Signed)
You have been scheduled for an endoscopy. Please follow written instructions given to you at your visit today. If you use inhalers (even only as needed), please bring them with you on the day of your procedure.   We are changing you colonoscopy recall to 2023.   Stop the dicyclomine.   I appreciate the opportunity to care for you. Silvano Rusk, MD, [facility]

## 2019-03-12 ENCOUNTER — Ambulatory Visit (INDEPENDENT_AMBULATORY_CARE_PROVIDER_SITE_OTHER): Payer: Medicare Other

## 2019-03-12 DIAGNOSIS — Z1159 Encounter for screening for other viral diseases: Secondary | ICD-10-CM

## 2019-03-13 LAB — SARS CORONAVIRUS 2 (TAT 6-24 HRS): SARS Coronavirus 2: NEGATIVE

## 2019-03-14 ENCOUNTER — Other Ambulatory Visit: Payer: Self-pay

## 2019-03-14 ENCOUNTER — Ambulatory Visit (AMBULATORY_SURGERY_CENTER): Payer: Medicare Other | Admitting: Internal Medicine

## 2019-03-14 ENCOUNTER — Encounter: Payer: Self-pay | Admitting: Internal Medicine

## 2019-03-14 VITALS — BP 126/67 | HR 55 | Temp 98.0°F | Resp 22 | Ht 65.0 in | Wt 201.0 lb

## 2019-03-14 DIAGNOSIS — K317 Polyp of stomach and duodenum: Secondary | ICD-10-CM

## 2019-03-14 DIAGNOSIS — R1013 Epigastric pain: Secondary | ICD-10-CM | POA: Diagnosis not present

## 2019-03-14 MED ORDER — SODIUM CHLORIDE 0.9 % IV SOLN: 500.0000 mL | Freq: Once | INTRAVENOUS | Status: DC

## 2019-03-14 NOTE — Progress Notes (Signed)
VS-CW Temp-JB

## 2019-03-14 NOTE — Patient Instructions (Addendum)
There were some stomach polyps that I think are benign and not a problem but they were inflamed and may leak some blood - I took biopsies.  I did not see anything that looks like cancer.  Once I see the results I will let you know my thoughts and plans.  I appreciate the opportunity to care for you. Gatha Mayer, MD, FACG   YOU HAD AN ENDOSCOPIC PROCEDURE TODAY AT [facility]:   Refer to the procedure report that was given to you for any specific questions about what was found during the examination.  If the procedure report does not answer your questions, please call your gastroenterologist to clarify.  If you requested that your care partner not be given the details of your procedure findings, then the procedure report has been included in a sealed envelope for you to review at your convenience later.  Please Note:  You might notice some irritation and congestion in your nose or some drainage.  This is from the oxygen used during your procedure.  There is no need for concern and it should clear up in a day or so.  SYMPTOMS TO REPORT IMMEDIATELY:   Following upper endoscopy (EGD)  Vomiting of blood or coffee ground material  New chest pain or pain under the shoulder blades  Painful or persistently difficult swallowing  New shortness of breath  Fever of 100F or higher  Black, tarry-looking stools  For urgent or emergent issues, a gastroenterologist can be reached at any hour by calling [phone].   DIET:  We do recommend a small meal at first, but then you may proceed to your regular diet.  Drink plenty of fluids but you should avoid alcoholic beverages for 24 hours.  ACTIVITY:  You should plan to take it easy for the rest of today and you should NOT DRIVE or use heavy machinery until tomorrow (because of the sedation medicines used during the test).    FOLLOW UP: Our staff will call the number listed on your records 48-72 hours following your procedure to  check on you and address any questions or concerns that you may have regarding the information given to you following your procedure. If we do not reach you, we will leave a message.  We will attempt to reach you two times.  During this call, we will ask if you have developed any symptoms of COVID 19. If you develop any symptoms (ie: fever, flu-like symptoms, shortness of breath, cough etc.) before then, please call [phone].  If you test positive for Covid 19 in the 2 weeks post procedure, please call and report this information to [place].    If any biopsies were taken you will be contacted by phone or by letter within the next 1-3 weeks.  Please call us at [phone] if you have not heard about the biopsies in 3 weeks.    SIGNATURES/CONFIDENTIALITY: You and/or your care partner have signed paperwork which will be entered into your electronic medical record.  These signatures attest to the fact that that the information above on your After Visit Summary has been reviewed and is understood.  Full responsibility of the confidentiality of this discharge information lies with you and/or your care-partner.

## 2019-03-14 NOTE — Progress Notes (Signed)
Pt tolerated well. VSS. Awake and to recovery. 

## 2019-03-14 NOTE — Op Note (Signed)
[facility] Patient Name: Joseph [name] Procedure Date: 03/14/2019 10:08 AM MRN: [id number] Endoscopist: Gatha Mayer , MD Age: 63 Referring MD:  Date of Birth: March 24, 1956 Gender: Male Account #: [id number] Procedure:                Upper GI endoscopy Indications:              Epigastric abdominal pain, Persistent vomiting Medicines:                Propofol per Anesthesia, Monitored Anesthesia Care Procedure:                Pre-Anesthesia Assessment:                           - Prior to the procedure, a History and Physical                            was performed, and patient medications and                            allergies were reviewed. The patient's tolerance of                            previous anesthesia was also reviewed. The risks                            and benefits of the procedure and the sedation                            options and risks were discussed with the patient.                            All questions were answered, and informed consent                            was obtained. Prior Anticoagulants: The patient has                            taken no previous anticoagulant or antiplatelet                            agents. ASA Grade Assessment: III - A patient with                            severe systemic disease. After reviewing the risks                            and benefits, the patient was deemed in                            satisfactory condition to undergo the procedure.                           After obtaining informed consent, the endoscope was  passed under direct vision. Throughout the                            procedure, the patient's blood pressure, pulse, and                            oxygen saturations were monitored continuously. The                            Endoscope was introduced through the mouth, and                            advanced to the second part of duodenum. The upper                     GI endoscopy was accomplished without difficulty.                            The patient tolerated the procedure well. Scope In: Scope Out: Findings:                 Multiple 1 to 8 mm sessile polyps with stigmata of                            recent bleeding were found in the entire examined                            stomach. Biopsies were taken with a cold forceps                            for histology. Verification of patient                            identification for the specimen was done. Estimated                            blood loss was minimal.                           Diffuse mucosal changes characterized by                            congestion, longitudinal markings and a decreased                            vascular pattern were found in the gastric body.                            Biopsies were taken with a cold forceps for                            histology. Verification of patient identification                            for the specimen was done. Estimated blood  loss was                            minimal.                           The exam was otherwise without abnormality.                           The cardia and gastric fundus were normal on                            retroflexion. Complications:            No immediate complications. Estimated Blood Loss:     Estimated blood loss was minimal. Impression:               - Multiple gastric polyps. Biopsied.                           - Congested, longitudinally marked and decreased                            vascular pattern mucosa in the gastric body.                            Biopsied.                           - The examination was otherwise normal. Recommendation:           - Patient has a contact number available for                            emergencies. The signs and symptoms of potential                            delayed complications were discussed with the                             patient. Return to normal activities tomorrow.                            Written discharge instructions were provided to the                            patient.                           - Resume previous diet.                           - Continue present medications.                           - Await pathology results. Gatha Mayer, MD 03/14/2019 10:43:15 AM This report has been signed electronically.

## 2019-03-14 NOTE — Progress Notes (Signed)
Called to room to assist during endoscopic procedure.  Patient ID and intended procedure confirmed with present staff. Received instructions for my participation in the procedure from the performing physician.  

## 2019-03-17 ENCOUNTER — Other Ambulatory Visit: Payer: Self-pay | Admitting: Internal Medicine

## 2019-03-17 NOTE — Telephone Encounter (Signed)
Rx(s) sent to pharmacy electronically.  

## 2019-03-18 ENCOUNTER — Telehealth: Payer: Self-pay | Admitting: *Deleted

## 2019-03-18 ENCOUNTER — Telehealth: Payer: Self-pay | Admitting: Internal Medicine

## 2019-03-18 NOTE — Telephone Encounter (Signed)
No answer for post procedure call back. Left message for patient to call with questions or concerns. 

## 2019-03-18 NOTE — Telephone Encounter (Signed)
I left a detailed message that the pathology has not resulted and  We will call back once it has.

## 2019-03-18 NOTE — Telephone Encounter (Signed)
Attempted phone call. No answer

## 2019-03-19 ENCOUNTER — Encounter: Payer: Self-pay | Admitting: Internal Medicine

## 2019-03-19 DIAGNOSIS — E1143 Type 2 diabetes mellitus with diabetic autonomic (poly)neuropathy: Secondary | ICD-10-CM

## 2019-03-19 DIAGNOSIS — K317 Polyp of stomach and duodenum: Secondary | ICD-10-CM

## 2019-03-19 HISTORY — DX: Type 2 diabetes mellitus with diabetic autonomic (poly)neuropathy: E11.43

## 2019-03-19 HISTORY — DX: Polyp of stomach and duodenum: K31.7

## 2019-03-19 NOTE — Progress Notes (Signed)
[facility] - 1 year EGD recall no letter  Office - explain stomach polyps benign - will recheck in 1 year - no signs infection, cancer etc  For treatment I want to change sertraline to duloxetine which I think will help his anxiety and the pain and vomiting which I think is from nerve damage caused by diabetes. That will involve a taper off the sertraline and starting on the duloxetine and I think best if done by PCP. I am going to send a letter about that.  For now can try cyclobenzaprine 10 mg tid/hs prn # 90 no refill for the pain (and might help vomiting)  I want him to have a March appointment w/ me also

## 2019-03-20 ENCOUNTER — Other Ambulatory Visit: Payer: Self-pay

## 2019-03-20 MED ORDER — CYCLOBENZAPRINE HCL 10 MG PO TABS: 10.0000 mg | ORAL_TABLET | Freq: Three times a day (TID) | ORAL | 0 refills | Status: DC | PRN

## 2019-04-14 ENCOUNTER — Encounter (INDEPENDENT_AMBULATORY_CARE_PROVIDER_SITE_OTHER): Payer: Medicare Other | Admitting: Ophthalmology

## 2019-04-14 ENCOUNTER — Encounter (INDEPENDENT_AMBULATORY_CARE_PROVIDER_SITE_OTHER): Payer: Self-pay

## 2019-04-23 ENCOUNTER — Telehealth: Payer: Self-pay | Admitting: Internal Medicine

## 2019-04-23 MED ORDER — CYCLOBENZAPRINE HCL 10 MG PO TABS: 10.0000 mg | ORAL_TABLET | Freq: Three times a day (TID) | ORAL | 1 refills | Status: DC | PRN

## 2019-04-23 NOTE — Telephone Encounter (Signed)
May I refill Sir? Thank you. 

## 2019-04-23 NOTE — Telephone Encounter (Signed)
I refilled it  Would like to see him again in March

## 2019-04-23 NOTE — Telephone Encounter (Signed)
Patient's daughter is calling for refill request for Cyclobenzaprine 10mg  says they just noticed he only has 3 pills left enough for tomorrow. She also mentioned that the medication has been helping the patient a lot.

## 2019-04-24 MED ORDER — CYCLOBENZAPRINE HCL 10 MG PO TABS: 10.0000 mg | ORAL_TABLET | Freq: Three times a day (TID) | ORAL | 1 refills | Status: DC | PRN

## 2019-04-24 NOTE — Telephone Encounter (Signed)
Generic flexeril rx cancelled at [facility] where Dr Carlean Purl sent it yesterday and I have resent [facility] as requested.

## 2019-04-24 NOTE — Addendum Note (Signed)
Addended by: [place], Chosen Geske E on: 04/24/2019 02:54 PM   Modules accepted: Orders

## 2019-04-24 NOTE — Telephone Encounter (Signed)
Called and spoke with patient's daughter he is scheduled for 06/18/19.

## 2019-04-24 NOTE — Telephone Encounter (Signed)
Patient's daughter called back to advise that they would like the script [facility] [place]

## 2019-04-30 ENCOUNTER — Encounter (INDEPENDENT_AMBULATORY_CARE_PROVIDER_SITE_OTHER): Payer: Medicare Other | Admitting: Ophthalmology

## 2019-04-30 DIAGNOSIS — E11319 Type 2 diabetes mellitus with unspecified diabetic retinopathy without macular edema: Secondary | ICD-10-CM | POA: Diagnosis not present

## 2019-04-30 DIAGNOSIS — H35033 Hypertensive retinopathy, bilateral: Secondary | ICD-10-CM

## 2019-04-30 DIAGNOSIS — I10 Essential (primary) hypertension: Secondary | ICD-10-CM

## 2019-04-30 DIAGNOSIS — E113593 Type 2 diabetes mellitus with proliferative diabetic retinopathy without macular edema, bilateral: Secondary | ICD-10-CM

## 2019-05-21 ENCOUNTER — Ambulatory Visit: Payer: Medicare Other | Admitting: Internal Medicine

## 2019-06-18 ENCOUNTER — Other Ambulatory Visit: Payer: Self-pay

## 2019-06-18 ENCOUNTER — Ambulatory Visit (INDEPENDENT_AMBULATORY_CARE_PROVIDER_SITE_OTHER): Payer: Medicare HMO | Admitting: Internal Medicine

## 2019-06-18 VITALS — BP 158/80 | HR 72 | Temp 98.5°F | Ht 65.0 in | Wt 209.0 lb

## 2019-06-18 DIAGNOSIS — K317 Polyp of stomach and duodenum: Secondary | ICD-10-CM | POA: Diagnosis not present

## 2019-06-18 DIAGNOSIS — E1143 Type 2 diabetes mellitus with diabetic autonomic (poly)neuropathy: Secondary | ICD-10-CM | POA: Diagnosis not present

## 2019-06-18 DIAGNOSIS — K3184 Gastroparesis: Secondary | ICD-10-CM

## 2019-06-18 DIAGNOSIS — K5909 Other constipation: Secondary | ICD-10-CM | POA: Diagnosis not present

## 2019-06-18 DIAGNOSIS — K219 Gastro-esophageal reflux disease without esophagitis: Secondary | ICD-10-CM

## 2019-06-18 MED ORDER — FAMOTIDINE 40 MG PO TABS: 40.0000 mg | ORAL_TABLET | Freq: Every day | ORAL | 3 refills | Status: DC

## 2019-06-18 NOTE — Patient Instructions (Signed)
  We have sent the following medications to your pharmacy for you to pick up at your convenience: pepcid  Take a dulcolax every other night at bedtime.   Follow up with Dr Carlean Purl in 2 months.   I appreciate the opportunity to care for you. Silvano Rusk, MD , [facility]

## 2019-06-18 NOTE — Progress Notes (Signed)
Joseph [name] 64 y.o. 1955/12/29 [id number]  Assessment & Plan:   Encounter Diagnoses  Name Primary?  . Gastroesophageal reflux disease, unspecified whether esophagitis present Yes  . Diabetic gastroparesis ([place])   . Gastric polyps - hyperplastic   . Diabetic autonomic neuropathy associated with type 2 diabetes mellitus ([place])   . Chronic constipation     I am not convinced his current symptoms are vomiting or necessarily gastroparesis because of its vomiting after meals I wonder if he is not having some symptomatic reflux issues overnight in the early morning so I will add Pepcid 40 mg at bedtime.  Treat constipation with Dulcolax 1 every other night  He is waiting 5 hours between eating and going to bed so I think he is doing a good job there  Regarding what I think is autonomic neuropathy and pain in the abdomen hopefully he will be able to titrate upwards on his duloxetine from 30 to 60 mg that would be my recommendation  I plan to see him back in 2 months routinely CC: Leeanne Rio, MD  Subjective:   Chief Complaint: Epigastric pain, vomiting  HPI The patient is here for follow-up he has a diagnosis of diabetic gastroparesis and he has chronic abdominal pain and recent EGD in December revealed some hyperplastic and fundic gland polyps that I plan to look at again in a year.  I have asked that he be transitioned from sertraline to duloxetine by primary care and they have done so recently in early March, or perhaps February and he is on 30 mg daily and thinks he might feel little better.  He still has trouble with vomiting but it is not a postprandial vomiting is in gastroparesis (note metoclopramide was discontinued due to potential interaction with duloxetine) and he says he frequently wakes up and will have nausea and some vomiting before he eats anything in the morning.  Does have some persistent heartburn symptomatology as well he is on omeprazole 40 mg every  morning.  He moves his bowels every few days but does not feel like he has good defecation. Allergies  Allergen Reactions  . Robaxin [Methocarbamol] Itching and Rash   Current Meds  Medication Sig  . aspirin 81 MG tablet Take 81 mg by mouth daily.  . Aspirin-Calcium Carbonate 81-777 MG TABS Take by mouth.  . cholecalciferol (VITAMIN D) 1000 UNITS tablet Take 1,000 Units by mouth daily.  . cyclobenzaprine (FLEXERIL) 10 MG tablet Take 1 tablet (10 mg total) by mouth 3 (three) times daily as needed for muscle spasms.  Marland Kitchen dicyclomine (BENTYL) 10 MG capsule Take 1 capsule (10 mg total) by mouth 2 (two) times daily.  . DULoxetine (CYMBALTA) 30 MG capsule Take 30 mg by mouth daily.  . ergocalciferol (VITAMIN D2) 1.25 MG (50000 UT) capsule Take 1 capsule by mouth once a week.  . fenofibrate 160 MG tablet Take 160 mg by mouth daily.  . ferrous sulfate 325 (65 FE) MG tablet Take 325 mg by mouth 2 (two) times daily.  . furosemide (LASIX) 20 MG tablet TAKE 1 TABLET (20 MG TOTAL) BY MOUTH DAILY. PLEASE CONTACT OFFICE FOR APPOINTMENT NEED OV.  . Insulin Degludec (TRESIBA FLEXTOUCH [place]) Inject 20 Units into the skin daily.   . insulin lispro (HUMALOG) 100 UNIT/ML KwikPen 10-15 Units 3 (three) times daily.   Marland Kitchen lisinopril (PRINIVIL,ZESTRIL) 20 MG tablet TAKE 1 TABLET (20 MG TOTAL) BY MOUTH DAILY.  . metoprolol tartrate (LOPRESSOR) 25 MG tablet TAKE 1  TABLET (25 MG TOTAL) BY MOUTH 2 (TWO) TIMES DAILY.  . montelukast (SINGULAIR) 10 MG tablet Take 1 tablet by mouth daily.  Marland Kitchen NOVOLOG FLEXPEN 100 UNIT/ML FlexPen Inject 17-20 Units into the skin 3 (three) times daily.  Marland Kitchen omeprazole (PRILOSEC) 20 MG capsule Take 40 mg by mouth daily.   . pravastatin (PRAVACHOL) 40 MG tablet Take 1 tablet (40 mg total) by mouth daily.  . SYMBICORT 160-4.5 MCG/ACT inhaler Inhale 1 puff into the lungs 2 (two) times a day.  . [DISCONTINUED] metoCLOPramide (REGLAN) 10 MG tablet TAKE 1 TABLET BY MOUTH THREE TIMES A DAY BEFORE MEALS  .  [DISCONTINUED] sertraline (ZOLOFT) 100 MG tablet Take 150 mg by mouth daily.   Past Medical History:  Diagnosis Date  . Anemia   . Anxiety   . Arthritis   . BPH (benign prostatic hypertrophy)   . Cataract   . Chronic renal insufficiency, stage III (moderate)    [place] kidney  . Coronary artery disease    Dr Debara Pickett   . Depression   . Diabetes mellitus without complication ([place]) [id number]   insulin dependent  . Diabetic autonomic neuropathy ([place]) 03/19/2019  . Gastric polyps - hyperplastic 03/19/2019  . Gastroparesis due to DM ([place])   . GERD (gastroesophageal reflux disease)   . Hx of adenomatous colonic polyps 06/11/2014  . Hyperlipidemia   . Hypertension   . Myocardial infarction ([place])   . Neuropathy   . PVD (peripheral vascular disease) ([place])   . Sleep apnea 2010   refuses to use cpap   Past Surgical History:  Procedure Laterality Date  . ANKLE FRACTURE SURGERY    . CARDIAC CATHETERIZATION  01/30/2012   This demonstrated proximal to mid LAD disease and disease of 2 large OM vessels concerning for surgical disease.  Marland Kitchen COLONOSCOPY    . CORONARY ARTERY BYPASS GRAFT  02/06/2012   Procedure: CORONARY ARTERY BYPASS GRAFTING (CABG); 5- Vessel bypass a LIMA to the LAD, a saphenous vein to the to the diagonal , a saphenous vein to the distal OM, and sequential reverse saphenous vien graft to the posterior descending and 2nd OM. Surgeon: Grace Isaac, MD;  Location: [place];  Service: Open Heart Surgery;  Laterality: [name];  . ESOPHAGOGASTRODUODENOSCOPY  2005  . LEFT HEART CATHETERIZATION WITH CORONARY ANGIOGRAM [name] 01/30/2012   Procedure: LEFT HEART CATHETERIZATION WITH CORONARY ANGIOGRAM;  Surgeon: Pixie Casino, MD;  Location: [facility] CATH LAB;  Service: Cardiovascular;  Laterality: [name];  . UPPER GASTROINTESTINAL ENDOSCOPY     Social History   Social History Narrative   Widowed, 1 son and 4 daughters   Disabled and dual eligible Medicare and Medicaid   One caffeinated beverage daily    family history includes Cancer in his sister; Diabetes in his father; Hypertension in his father.   Review of Systems As per HPI Objective:   Physical Exam BP (!) 158/80   Pulse 72   Temp 98.5 F (36.9 C)   Ht 5\' 5"  (1.651 m)   Wt 209 lb (94.8 kg)   BMI 34.78 kg/m

## 2019-06-25 ENCOUNTER — Other Ambulatory Visit: Payer: Self-pay | Admitting: Internal Medicine

## 2019-06-25 MED ORDER — CYCLOBENZAPRINE HCL 10 MG PO TABS: 10.0000 mg | ORAL_TABLET | Freq: Three times a day (TID) | ORAL | 1 refills | Status: DC | PRN

## 2019-06-25 NOTE — Telephone Encounter (Signed)
Please advise Sir, thank you. 

## 2019-06-25 NOTE — Telephone Encounter (Signed)
Pt's daughter inquired whether refill for cyclobenzaprine is appropriate.

## 2019-06-25 NOTE — Addendum Note (Signed)
Addended by: [place], Meggen Spaziani E on: 06/25/2019 05:05 PM   Modules accepted: Orders

## 2019-06-25 NOTE — Telephone Encounter (Signed)
I had Alphonse Guild one of our patient care coordinator's call and speak to the daughter. She put Dad on the phone via a 3 way call and he said that the cyclobenzaprine is definitely helping. I have sent it in as approved.

## 2019-06-25 NOTE — Telephone Encounter (Signed)
If he thinks it helps we can refill it  # 60 1 refill

## 2019-07-28 ENCOUNTER — Other Ambulatory Visit: Payer: Self-pay | Admitting: Internal Medicine

## 2019-07-28 NOTE — Telephone Encounter (Signed)
May I refill Sir? Thank you. 

## 2019-08-04 ENCOUNTER — Encounter: Payer: Self-pay | Admitting: Physician Assistant

## 2019-08-04 ENCOUNTER — Telehealth (INDEPENDENT_AMBULATORY_CARE_PROVIDER_SITE_OTHER): Payer: Medicare HMO | Admitting: Physician Assistant

## 2019-08-04 VITALS — BP 154/80 | HR 66 | Ht 65.0 in | Wt 211.0 lb

## 2019-08-04 DIAGNOSIS — E785 Hyperlipidemia, unspecified: Secondary | ICD-10-CM

## 2019-08-04 DIAGNOSIS — I25810 Atherosclerosis of coronary artery bypass graft(s) without angina pectoris: Secondary | ICD-10-CM | POA: Diagnosis not present

## 2019-08-04 DIAGNOSIS — Z794 Long term (current) use of insulin: Secondary | ICD-10-CM

## 2019-08-04 DIAGNOSIS — E119 Type 2 diabetes mellitus without complications: Secondary | ICD-10-CM

## 2019-08-04 DIAGNOSIS — I10 Essential (primary) hypertension: Secondary | ICD-10-CM

## 2019-08-04 DIAGNOSIS — N183 Chronic kidney disease, stage 3 unspecified: Secondary | ICD-10-CM

## 2019-08-04 NOTE — Patient Instructions (Signed)
Your physician recommends that you continue on your current medications as directed. Please refer to the Current Medication list given to you today.   Your physician wants you to follow-up in: 6 MONTHS WITH DR [name]   You will receive a reminder letter in the mail two months in advance. If you don't receive a letter, please call our office to schedule the follow-up appointment.  

## 2019-08-04 NOTE — Progress Notes (Signed)
Virtual Visit via Video Note   This visit type was conducted due to national recommendations for restrictions regarding the COVID-19 Pandemic (e.g. social distancing) in an effort to limit this patient's exposure and mitigate transmission in our community.  Due to his co-morbid illnesses, this patient is at least at moderate risk for complications without adequate follow up.  This format is felt to be most appropriate for this patient at this time.  All issues noted in this document were discussed and addressed.  A limited physical exam was performed with this format.  Please refer to the patient's chart for his consent to telehealth for [facility].   The patient was identified using 2 identifiers.  Date:  08/04/2019   ID:  Joseph [name], DOB 1955-09-09, MRN [id number]  Patient Location: Home Provider Location: Home  PCP:  Leeanne Rio, MD  Cardiologist:  Pixie Casino, MD  Electrophysiologist:  None   Evaluation Performed:  Follow-Up Visit  Chief Complaint:  6 month followup  History of Present Illness:    Joseph [name] is a 64 y.o. male with past medical history of insulin-dependent DM 2, CKD stage III, hypertension, hyperlipidemia, PVD, obstructive sleep apnea refused CPAP therapy and CAD s/p CABG.  Previous cardiac catheterization performed in November 2013 demonstrated proximal to mid LAD lesion and OM 2 disease.  He eventually underwent CABG x5 on 02/06/2012 with LIMA to LAD, SVG to diagonal, SVG to distal OM and a sequential reverse SVG to posterior descending and OM 2.  Last Myoview obtained in March 2019 showed EF 63%, overall low risk study.  Echocardiogram obtained on 02/18/2018 showed EF 60 to 65%, grade 2 DD.  Patient was last seen by Dr. Debara Pickett on 02/13/2019 at which time he denied any symptom, however his systolic blood pressure was 190s.  Since his last follow-up, he has been doing well without any chest pain or shortness of breath.  He was recently seen by  his PCP in April 2021 at which time his systolic blood pressure was in the 130s.  Today his systolic blood pressure went up to 150s however this is before he took any blood pressure medications.  Otherwise he has received both [place] vaccines.  He is doing well from cardiac perspective and can follow-up in 6 months.  The patient does not have symptoms concerning for COVID-19 infection (fever, chills, cough, or new shortness of breath).    Past Medical History:  Diagnosis Date   Anemia    Anxiety    Arthritis    BPH (benign prostatic hypertrophy)    Cataract    Chronic renal insufficiency, stage III (moderate)    [place] kidney   Coronary artery disease    Dr Debara Pickett    Depression    Diabetes mellitus without complication ([place]) XX123456   insulin dependent   Diabetic autonomic neuropathy ([place]) 03/19/2019   Gastric polyps - hyperplastic 03/19/2019   Gastroparesis due to DM ([facility])    GERD (gastroesophageal reflux disease)    Hx of adenomatous colonic polyps 06/11/2014   Hyperlipidemia    Hypertension    Myocardial infarction ([place])    Neuropathy    PVD (peripheral vascular disease) ([place])    Sleep apnea 2010   refuses to use cpap   Past Surgical History:  Procedure Laterality Date   ANKLE FRACTURE SURGERY     CARDIAC CATHETERIZATION  01/30/2012   This demonstrated proximal to mid LAD disease and disease of 2 large OM vessels concerning  for surgical disease.   COLONOSCOPY     CORONARY ARTERY BYPASS GRAFT  02/06/2012   Procedure: CORONARY ARTERY BYPASS GRAFTING (CABG); 5- Vessel bypass a LIMA to the LAD, a saphenous vein to the to the diagonal , a saphenous vein to the distal OM, and sequential reverse saphenous vien graft to the posterior descending and 2nd OM. Surgeon: Joseph Isaac, MD;  Location: [place];  Service: Open Heart Surgery;  Laterality: [name];   ESOPHAGOGASTRODUODENOSCOPY  2005   LEFT HEART CATHETERIZATION WITH CORONARY ANGIOGRAM [name] 01/30/2012    Procedure: LEFT HEART CATHETERIZATION WITH CORONARY ANGIOGRAM;  Surgeon: Pixie Casino, MD;  Location[facility] CATH LAB;  Service: Cardiovascular;  Laterality: [name];   UPPER GASTROINTESTINAL ENDOSCOPY       Current Meds  Medication Sig   aspirin 81 MG tablet Take 81 mg by mouth daily.   cholecalciferol (VITAMIN D) 1000 UNITS tablet Take 1,000 Units by mouth daily.   cyclobenzaprine (FLEXERIL) 10 MG tablet TAKE 1 TABLET BY MOUTH THREE TIMES A DAY AS NEEDED FOR MUSCLE SPASMS   dicyclomine (BENTYL) 10 MG capsule Take 1 capsule (10 mg total) by mouth 2 (two) times daily.   DULoxetine (CYMBALTA) 30 MG capsule Take 30 mg by mouth daily.   ergocalciferol (VITAMIN D2) 1.25 MG (50000 UT) capsule Take 1 capsule by mouth once a week.   famotidine (PEPCID) 40 MG tablet Take 1 tablet (40 mg total) by mouth at bedtime.   fenofibrate 160 MG tablet Take 160 mg by mouth daily.   ferrous sulfate 325 (65 FE) MG tablet Take 325 mg by mouth 2 (two) times daily.   furosemide (LASIX) 20 MG tablet TAKE 1 TABLET (20 MG TOTAL) BY MOUTH DAILY. PLEASE CONTACT OFFICE FOR APPOINTMENT NEED OV.   Insulin Degludec (TRESIBA FLEXTOUCH [place]) Inject 20 Units into the skin daily.    insulin lispro (HUMALOG) 100 UNIT/ML KwikPen 10-15 Units 3 (three) times daily.    lisinopril (PRINIVIL,ZESTRIL) 20 MG tablet TAKE 1 TABLET (20 MG TOTAL) BY MOUTH DAILY.   metoprolol tartrate (LOPRESSOR) 25 MG tablet TAKE 1 TABLET (25 MG TOTAL) BY MOUTH 2 (TWO) TIMES DAILY.   montelukast (SINGULAIR) 10 MG tablet Take 1 tablet by mouth daily.   NOVOLOG FLEXPEN 100 UNIT/ML FlexPen Inject 17-20 Units into the skin 3 (three) times daily.   omeprazole (PRILOSEC) 20 MG capsule Take 40 mg by mouth daily.    pravastatin (PRAVACHOL) 40 MG tablet Take 1 tablet (40 mg total) by mouth daily.   SYMBICORT 160-4.5 MCG/ACT inhaler Inhale 1 puff into the lungs 2 (two) times a day.     Allergies:   Robaxin [methocarbamol]   Social History    Tobacco Use   Smoking status: Never Smoker   Smokeless tobacco: Never Used  Substance Use Topics   Alcohol use: No   Drug use: No     Family Hx: The patient's family history includes Cancer in his sister; Diabetes in his father; Hypertension in his father. There is no history of Colon cancer, Esophageal cancer, Rectal cancer, or Stomach cancer.  ROS:   Please see the history of present illness.     All other systems reviewed and are negative.   Prior CV studies:   The following studies were reviewed today:  Myoview 06/05/2017  Nuclear stress EF: 63%. The left ventricular ejection fraction is normal (55-65%).  The study is normal.  This is a low risk study.   Echo 02/18/2018 LV EF: 60% -  65%   -------------------------------------------------------------------  Indications:   R07.9 Chest pain. Z95.1 Status post CABG. I20.0  Unstable angina.   -------------------------------------------------------------------  History:  PMH: Echocardiogram with Def[place].   -------------------------------------------------------------------  Study Conclusions   - Left ventricle: The cavity size was normal. Wall thickness was  increased in a pattern of mild LVH. Systolic function was normal.  The estimated ejection fraction was in the range of 60% to 65%.  Features are consistent with a pseudonormal left ventricular  filling pattern, with concomitant abnormal relaxation and  increased filling pressure (grade 2 diastolic dysfunction).   Labs/Other Tests and Data Reviewed:    EKG:  An ECG dated 02/17/2019 was personally reviewed today and demonstrated:  Normal sinus rhythm with T wave inversion in the inferior leads.  Recent Labs: 01/22/2019: Hemoglobin 12.0; Platelets 245.0   Recent Lipid Panel Lab Results  Component Value Date/Time   CHOL 119 02/14/2019 10:50 AM   CHOL 96 (L) 03/04/2014 10:50 AM   TRIG 138 02/14/2019 10:50 AM   TRIG 106 03/04/2014  10:50 AM   HDL 32 (L) 02/14/2019 10:50 AM   HDL 37 (L) 03/04/2014 10:50 AM   CHOLHDL 3.7 02/14/2019 10:50 AM   CHOLHDL 2.5 03/05/2015 11:05 AM   LDLCALC 63 02/14/2019 10:50 AM   LDLCALC 38 03/04/2014 10:50 AM    Wt Readings from Last 3 Encounters:  08/04/19 211 lb (95.7 kg)  06/18/19 209 lb (94.8 kg)  03/14/19 201 lb (91.2 kg)     Objective:    Vital Signs:  BP (!) 154/80    Pulse 66    Ht 5\' 5"  (1.651 m)    Wt 211 lb (95.7 kg)    BMI 35.11 kg/m    VITAL SIGNS:  reviewed  ASSESSMENT & PLAN:    1. CAD s/p CABG: Denies any recent chest pain.  On aspirin and pravastatin  2. Hypertension: Blood pressure elevated today, however patient says he has not taking his morning blood pressure medication yet.  3. Hyperlipidemia: Continue pravastatin  4. CKD stage III: Stable on recent lab work.  5. Insulin-dependent DM 2: Managed by primary care provider  COVID-19 Education: The signs and symptoms of COVID-19 were discussed with the patient and how to seek care for testing (follow up with PCP or arrange E-visit).  The importance of social distancing was discussed today.  Time:   Today, I have spent 5 minutes with the patient with telehealth technology discussing the above problems.     Medication Adjustments/Labs and Tests Ordered: Current medicines are reviewed at length with the patient today.  Concerns regarding medicines are outlined above.   Tests Ordered: No orders of the defined types were placed in this encounter.   Medication Changes: No orders of the defined types were placed in this encounter.   Follow Up:  Either In Person or Virtual in 6 month(s)  Signed, Almyra Deforest, [place]  08/04/2019 9:10 AM    [place]

## 2019-09-17 ENCOUNTER — Other Ambulatory Visit: Payer: Self-pay | Admitting: Internal Medicine

## 2019-09-17 NOTE — Telephone Encounter (Signed)
Please advise Sir, thank you. 

## 2019-09-18 ENCOUNTER — Telehealth: Payer: Self-pay | Admitting: Internal Medicine

## 2019-09-18 NOTE — Telephone Encounter (Signed)
Patient has set up appointment for 10/24/2019 so I will send in the rx as he requested.

## 2019-09-18 NOTE — Telephone Encounter (Signed)
Flexeril refilled as requested.

## 2019-09-18 NOTE — Telephone Encounter (Signed)
Have him schedule a f/u and refill until about that time

## 2019-09-18 NOTE — Telephone Encounter (Signed)
Patient returned the call and as follow up appt with APP 10/24/19 please refill until then.

## 2019-09-18 NOTE — Telephone Encounter (Signed)
Joseph [name] our patient care coordinator is going to reach out to him to set up an appointment.

## 2019-09-18 NOTE — Telephone Encounter (Signed)
Called Joseph [name] to advise him that he will need to schedule an office visit for further refills. CMA Precious Bard will send enough medications to hold him over until the appointment date, if we let her know. Joseph Gruenewald did not answer, so I left him a voicemail.

## 2019-10-11 ENCOUNTER — Other Ambulatory Visit: Payer: Self-pay | Admitting: Internal Medicine

## 2019-10-24 ENCOUNTER — Ambulatory Visit (INDEPENDENT_AMBULATORY_CARE_PROVIDER_SITE_OTHER): Payer: Medicare HMO | Admitting: Physician Assistant

## 2019-10-24 ENCOUNTER — Encounter: Payer: Self-pay | Admitting: Physician Assistant

## 2019-10-24 VITALS — BP 150/80 | HR 72 | Ht 66.0 in | Wt 211.4 lb

## 2019-10-24 DIAGNOSIS — K219 Gastro-esophageal reflux disease without esophagitis: Secondary | ICD-10-CM | POA: Diagnosis not present

## 2019-10-24 DIAGNOSIS — Z86010 Personal history of colonic polyps: Secondary | ICD-10-CM

## 2019-10-24 DIAGNOSIS — R110 Nausea: Secondary | ICD-10-CM | POA: Diagnosis not present

## 2019-10-24 MED ORDER — DICYCLOMINE HCL 10 MG PO CAPS: 10.0000 mg | ORAL_CAPSULE | Freq: Two times a day (BID) | ORAL | 3 refills | Status: DC

## 2019-10-24 MED ORDER — SUTAB 1479-225-188 MG PO TABS: 1.0000 | ORAL_TABLET | Freq: Once | ORAL | 0 refills | Status: AC

## 2019-10-24 MED ORDER — FAMOTIDINE 40 MG PO TABS: 40.0000 mg | ORAL_TABLET | Freq: Every day | ORAL | 3 refills | Status: DC

## 2019-10-24 NOTE — Progress Notes (Signed)
Chief Complaint: Medication refill and follow-up GERD   HPI:    Joseph [name] is a 64 year old Spanish-speaking male with a past medical history as listed below, known to Dr. Carlean Purl, who returns to clinic today for follow-up of GERD and medication refill.    06/05/2014 colonoscopy with 3 polyps, tubular adenomatous max 5 mm, repeat recommended in 2021.    06/18/2019 patient seen in clinic by Dr. Carlean Purl and at that time had a recent EGD in December which revealed some hyperplastic and fundic gland polyps.  Repeat was recommended in a year.  At that time described frequently waking up and having nausea and some vomiting before he ate anything in the morning.  Discussed that did not sound like his symptoms were necessarily gastroparesis.  Pepcid was added 40 mg at bedtime.  Also told to treat constipation with Dulcolax 1 every other night.  Is recommend that he titrate up on his Duloxetine from 30 to 60 mg for autonomic neuropathy and pain in the abdomen.    Today, the patient presents to clinic without a translator, he is a very poor historian.  He presents with his daughter.  He explains that he is doing fairly well.  He has only had 3 episodes of nausea in the morning since starting the Pepcid at night.  He does request a refill of the Pepcid as well as Dicyclomine which he was using 10 mg twice a day.  He denies any new complaints concerns and is overall is happy.  Does tell me that he never increased his Cymbalta.    Denies fever, chills, weight loss or symptoms that awaken him from sleep.  Past Medical History:  Diagnosis Date  . Anemia   . Anxiety   . Arthritis   . BPH (benign prostatic hypertrophy)   . Cataract   . Chronic renal insufficiency, stage III (moderate)    [facility]  . Coronary artery disease    Dr Debara Pickett   . Depression   . 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 Diabetic autonomic neuropathy ([place]) 03/19/2019  . Gastric polyps -  hyperplastic 03/19/2019  . Gastroparesis due to DM ([place])   . GERD (gastroesophageal reflux disease)   . Hx of adenomatous colonic polyps 06/11/2014  . Hyperlipidemia   . Hypertension   . Myocardial infarction ([place])   . Neuropathy   . PVD (peripheral vascular disease) ([place])   . Sleep apnea 2010   refuses to use cpap    Past Surgical History:  Procedure Laterality Date  . ANKLE FRACTURE SURGERY    . CARDIAC CATHETERIZATION  01/30/2012   This demonstrated proximal to mid LAD disease and disease of 2 large OM vessels concerning for surgical disease.  Marland Kitchen COLONOSCOPY    . CORONARY ARTERY BYPASS GRAFT  02/06/2012   Procedure: CORONARY ARTERY BYPASS GRAFTING (CABG); 5- Vessel bypass a LIMA to the LAD, a saphenous vein to the to the diagonal , a saphenous vein to the distal OM, and sequential reverse saphenous vien graft to the posterior descending and 2nd OM. Surgeon: Grace Isaac, MD;  Location: [facility];  Service: Open Heart Surgery;  Laterality: [name];  . ESOPHAGOGASTRODUODENOSCOPY  2005  . LEFT HEART CATHETERIZATION WITH CORONARY ANGIOGRAM [name] 01/30/2012   Procedure: LEFT HEART CATHETERIZATION WITH CORONARY ANGIOGRAM;  Surgeon: Pixie Casino, MD;  Location: [facility] CATH LAB;  Service: Cardiovascular;  Laterality: [name];  . UPPER GASTROINTESTINAL ENDOSCOPY      Current Outpatient  Medications  Medication Sig Dispense Refill  . aspirin 81 MG tablet Take 81 mg by mouth daily.    . Aspirin-Calcium Carbonate 81-777 MG TABS Take by mouth.    . cholecalciferol (VITAMIN D) 1000 UNITS tablet Take 1,000 Units by mouth daily.    . cyclobenzaprine (FLEXERIL) 10 MG tablet TAKE 1 TABLET BY MOUTH THREE TIMES A DAY AS NEEDED FOR MUSCLE SPASMS 60 tablet 1  . dicyclomine (BENTYL) 10 MG capsule Take 1 capsule (10 mg total) by mouth 2 (two) times daily. 28 capsule 1  . DULoxetine (CYMBALTA) 30 MG capsule Take 30 mg by mouth daily.    . ergocalciferol (VITAMIN D2) 1.25 MG (50000 UT) capsule Take 1 capsule by mouth  once a week.    . famotidine (PEPCID) 40 MG tablet Take 1 tablet (40 mg total) by mouth at bedtime. 90 tablet 3  . fenofibrate 160 MG tablet Take 160 mg by mouth daily.    . ferrous sulfate 325 (65 FE) MG tablet Take 325 mg by mouth 2 (two) times daily.    . furosemide (LASIX) 20 MG tablet TAKE 1 TABLET (20 MG TOTAL) BY MOUTH DAILY. PLEASE CONTACT OFFICE FOR APPOINTMENT NEED OV. 30 tablet 0  . Insulin Degludec (TRESIBA FLEXTOUCH [place]) Inject 20 Units into the skin daily.     Marland Kitchen lisinopril (PRINIVIL,ZESTRIL) 20 MG tablet TAKE 1 TABLET (20 MG TOTAL) BY MOUTH DAILY. 90 tablet 3  . metoprolol tartrate (LOPRESSOR) 25 MG tablet TAKE 1 TABLET (25 MG TOTAL) BY MOUTH 2 (TWO) TIMES DAILY. 60 tablet 9  . montelukast (SINGULAIR) 10 MG tablet Take 1 tablet by mouth daily.    Marland Kitchen NOVOLOG FLEXPEN 100 UNIT/ML FlexPen Inject 17-20 Units into the skin 3 (three) times daily.    Marland Kitchen omeprazole (PRILOSEC) 20 MG capsule Take 40 mg by mouth daily.     . pravastatin (PRAVACHOL) 40 MG tablet Take 1 tablet (40 mg total) by mouth daily. 90 tablet 3  . SYMBICORT 160-4.5 MCG/ACT inhaler Inhale 1 puff into the lungs 2 (two) times a day.    . insulin lispro (HUMALOG) 100 UNIT/ML KwikPen 10-15 Units 3 (three) times daily.  (Patient not taking: Reported on 10/24/2019)     No current facility-administered medications for this visit.    Allergies as of 10/24/2019 - Review Complete 10/24/2019  Allergen Reaction Noted  . Robaxin [methocarbamol] Itching and Rash 03/11/2019    Family History  Problem Relation Age of Onset  . Diabetes Father   . Hypertension Father   . Cancer Sister   . Colon cancer Neg Hx   . Esophageal cancer Neg Hx   . Rectal cancer Neg Hx   . Stomach cancer Neg Hx     Social History   Socioeconomic History  . Marital status: Widowed    Spouse name: Not on file  . Number of children: 4  . Years of education: Not on file  . Highest education level: Not on file  Occupational History  . Occupation:  disabled  Tobacco Use  . Smoking status: Never Smoker  . Smokeless tobacco: Never Used  Vaping Use  . Vaping Use: Never used  Substance and Sexual Activity  . Alcohol use: No  . Drug use: No  . Sexual activity: Not on file  Other Topics Concern  . Not on file  Social History Narrative   Widowed, 1 son and 4 daughters   Disabled and dual eligible Medicare and Medicaid   One caffeinated beverage daily  Social Determinants of Health   Financial Resource Strain:   . Difficulty of Paying Living Expenses:   Food Insecurity:   . Worried About Charity fundraiser in the Last Year:   . Arboriculturist in the Last Year:   Transportation Needs:   . Film/video editor (Medical):   Marland Kitchen Lack of Transportation (Non-Medical):   Physical Activity:   . Days of Exercise per Week:   . Minutes of Exercise per Session:   Stress:   . Feeling of Stress :   Social Connections:   . Frequency of Communication with Friends and Family:   . Frequency of Social Gatherings with Friends and Family:   . Attends Religious Services:   . Active Member of Clubs or Organizations:   . Attends Archivist Meetings:   Marland Kitchen Marital Status:   Intimate Partner Violence:   . Fear of Current or Ex-Partner:   . Emotionally Abused:   Marland Kitchen Physically Abused:   . Sexually Abused:     Review of Systems:    Constitutional: No weight loss, fever or chills Cardiovascular: No chest pain Respiratory: No SOB  Gastrointestinal: See HPI and otherwise negative   Physical Exam:  Vital signs: BP (!) 150/80   Pulse 72   Ht 5\' 6"  (1.676 m)   Wt (!) 211 lb 6.4 oz (95.9 kg)   BMI 34.12 kg/m   Constitutional:   Pleasant Hispanic male appears to be in NAD, Well developed, Well nourished, alert and cooperative Respiratory: Respirations even and unlabored. Lungs clear to auscultation bilaterally.   No wheezes, crackles, or rhonchi.  Cardiovascular: Normal S1, S2. No MRG. Regular rate and rhythm. No peripheral edema,  cyanosis or pallor.  Gastrointestinal:  Soft, nondistended, nontender. No rebound or guarding. Normal bowel sounds. No appreciable masses or hepatomegaly. Psychiatric:  Demonstrates good judgement and reason without abnormal affect or behaviors.  No recent labs/imaging.  Assessment: 1.  GERD/nausea: Overall better and decreased with Pepcid 40 mg nightly 2.  History of adenomatous polyps: Last colonoscopy in 2016 with recommendation for repeat in 5 years  Plan: 1.  Refilled Dicyclomine 10 mg twice daily #180 with 3 refills. 2.  Refilled Pepcid 40 mg nightly #90 with 3 refills. 3.  Scheduled patient for a surveillance colonoscopy given his history of adenomatous polyps with Dr. Carlean Purl in the [facility].  Did discuss risks, benefits, limitations and alternatives and patient agrees to proceed.  He has had his Covid vaccines. 4.  Patient to follow in clinic per recommendations from Dr. Carlean Purl after time of procedure.  Ellouise Newer, PA-C [facility] 10/24/2019, 10:30 AM  Cc: Leeanne Rio, MD

## 2019-10-24 NOTE — Patient Instructions (Addendum)
If you are age 64 or older, your body mass index should be between 23-30. Your Body mass index is 34.12 kg/m. If this is out of the aforementioned range listed, please consider follow up with your Primary Care Provider.  If you are age 61 or younger, your body mass index should be between 19-25. Your Body mass index is 34.12 kg/m. If this is out of the aformentioned range listed, please consider follow up with your Primary Care Provider.   We have sent the following medications to your pharmacy for you to pick up at your convenience: Dicyclomine 10 mg twice daily. Famotidine 40 mg nightly.   You have been scheduled for a colonoscopy. Please follow written instructions given to you at your visit today.  Please pick up your prep supplies at the pharmacy within the next 1-3 days. If you use inhalers (even only as needed), please bring them with you on the day of your procedure.  Due to recent changes in healthcare laws, you may see the results of your imaging and laboratory studies on MyChart before your provider has had a chance to review them.  We understand that in some cases there may be results that are confusing or concerning to you. Not all laboratory results come back in the same time frame and the provider may be waiting for multiple results in order to interpret others.  Please give [place] 48 hours in order for your provider to thoroughly review all the results before contacting the office for clarification of your results.

## 2019-11-03 ENCOUNTER — Encounter (INDEPENDENT_AMBULATORY_CARE_PROVIDER_SITE_OTHER): Payer: Medicare HMO | Admitting: Ophthalmology

## 2019-11-03 ENCOUNTER — Other Ambulatory Visit: Payer: Self-pay

## 2019-11-03 DIAGNOSIS — E11311 Type 2 diabetes mellitus with unspecified diabetic retinopathy with macular edema: Secondary | ICD-10-CM

## 2019-11-03 DIAGNOSIS — H35033 Hypertensive retinopathy, bilateral: Secondary | ICD-10-CM | POA: Diagnosis not present

## 2019-11-03 DIAGNOSIS — I10 Essential (primary) hypertension: Secondary | ICD-10-CM | POA: Diagnosis not present

## 2019-11-03 DIAGNOSIS — E113513 Type 2 diabetes mellitus with proliferative diabetic retinopathy with macular edema, bilateral: Secondary | ICD-10-CM | POA: Diagnosis not present

## 2019-11-06 ENCOUNTER — Encounter: Payer: Self-pay | Admitting: Certified Registered Nurse Anesthetist

## 2019-11-07 ENCOUNTER — Other Ambulatory Visit: Payer: Self-pay

## 2019-11-07 ENCOUNTER — Encounter: Payer: Self-pay | Admitting: Internal Medicine

## 2019-11-07 ENCOUNTER — Ambulatory Visit (AMBULATORY_SURGERY_CENTER): Payer: Medicare HMO | Admitting: Internal Medicine

## 2019-11-07 VITALS — BP 177/96 | HR 69 | Temp 96.6°F | Resp 17 | Ht 66.0 in | Wt 211.0 lb

## 2019-11-07 DIAGNOSIS — Z86010 Personal history of colonic polyps: Secondary | ICD-10-CM | POA: Diagnosis not present

## 2019-11-07 DIAGNOSIS — K635 Polyp of colon: Secondary | ICD-10-CM

## 2019-11-07 DIAGNOSIS — D123 Benign neoplasm of transverse colon: Secondary | ICD-10-CM

## 2019-11-07 DIAGNOSIS — D120 Benign neoplasm of cecum: Secondary | ICD-10-CM

## 2019-11-07 MED ORDER — SODIUM CHLORIDE 0.9 % IV SOLN: 500.0000 mL | Freq: Once | INTRAVENOUS | Status: DC

## 2019-11-07 NOTE — Patient Instructions (Addendum)
I found and removed 2 tiny polyps.  I will let you know pathology results and when to have another routine colonoscopy by mail and/or My Chart.  I appreciate the opportunity to care for you. Gatha Mayer, MD, [facility]   Handout on polyps & diverticulosis given to you today  Await pathology results on polyps removed    YOU HAD AN ENDOSCOPIC PROCEDURE TODAY AT [place]:   Refer to the procedure report that was given to you for any specific questions about what was found during the examination.  If the procedure report does not answer your questions, please call your gastroenterologist to clarify.  If you requested that your care partner not be given the details of your procedure findings, then the procedure report has been included in a sealed envelope for you to review at your convenience later.  YOU SHOULD EXPECT: Some feelings of bloating in the abdomen. Passage of more gas than usual.  Walking can help get rid of the air that was put into your GI tract during the procedure and reduce the bloating. If you had a lower endoscopy (such as a colonoscopy or flexible sigmoidoscopy) you may notice spotting of blood in your stool or on the toilet paper. If you underwent a bowel prep for your procedure, you may not have a normal bowel movement for a few days.  Please Note:  You might notice some irritation and congestion in your nose or some drainage.  This is from the oxygen used during your procedure.  There is no need for concern and it should clear up in a day or so.  SYMPTOMS TO REPORT IMMEDIATELY:   Following lower endoscopy (colonoscopy or flexible sigmoidoscopy):  Excessive amounts of blood in the stool  Significant tenderness or worsening of abdominal pains  Swelling of the abdomen that is new, acute  Fever of 100F or higher    For urgent or emergent issues, a gastroenterologist can be reached at any hour by calling [phone]. Do not use MyChart messaging for  urgent concerns.    DIET:  We do recommend a small meal at first, but then you may proceed to your regular diet.  Drink plenty of fluids but you should avoid alcoholic beverages for 24 hours.  ACTIVITY:  You should plan to take it easy for the rest of today and you should NOT DRIVE or use heavy machinery until tomorrow (because of the sedation medicines used during the test).    FOLLOW UP: Our staff will call the number listed on your records 48-72 hours following your procedure to check on you and address any questions or concerns that you may have regarding the information given to you following your procedure. If we do not reach you, we will leave a message.  We will attempt to reach you two times.  During this call, we will ask if you have developed any symptoms of COVID 19. If you develop any symptoms (ie: fever, flu-like symptoms, shortness of breath, cough etc.) before then, please call [phone].  If you test positive for Covid 19 in the 2 weeks post procedure, please call and report this information to [place].    If any biopsies were taken you will be contacted by phone or by letter within the next 1-3 weeks.  Please call us at [phone] if you have not heard about the biopsies in 3 weeks.    SIGNATURES/CONFIDENTIALITY: You and/or your care partner have signed paperwork which will be  be entered into your electronic medical record.  These signatures attest to the fact that that the information above on your After Visit Summary has been reviewed and is understood.  Full responsibility of the confidentiality of this discharge information lies with you and/or your care-partner. 

## 2019-11-07 NOTE — Progress Notes (Signed)
Report given to PACU, vss 

## 2019-11-07 NOTE — Progress Notes (Signed)
Called to room to assist during endoscopic procedure.  Patient ID and intended procedure confirmed with present staff. Received instructions for my participation in the procedure from the performing physician.  

## 2019-11-07 NOTE — Op Note (Signed)
[place] Patient Name: Joseph [name] Procedure Date: 11/07/2019 2:42 PM MRN: [id number] Endoscopist: Gatha Mayer , MD Age: 64 Referring MD:  Date of Birth: 05/12/1955 Gender: Male Account #: [id number] Procedure:                Colonoscopy Indications:              Surveillance: Personal history of adenomatous                            polyps on last colonoscopy 5 years ago Medicines:                Propofol per Anesthesia, Monitored Anesthesia Care Procedure:                Pre-Anesthesia Assessment:                           - Prior to the procedure, a History and Physical                            was performed, and patient medications and                            allergies were reviewed. The patient's tolerance of                            previous anesthesia was also reviewed. The risks                            and benefits of the procedure and the sedation                            options and risks were discussed with the patient.                            All questions were answered, and informed consent                            was obtained. Prior Anticoagulants: The patient has                            taken no previous anticoagulant or antiplatelet                            agents. ASA Grade Assessment: II - A patient with                            mild systemic disease. After reviewing the risks                            and benefits, the patient was deemed in                            satisfactory condition to undergo the procedure.  After obtaining informed consent, the colonoscope                            was passed under direct vision. Throughout the                            procedure, the patient's blood pressure, pulse, and                            oxygen saturations were monitored continuously. The                            Colonoscope was introduced through the anus and                             advanced to the the cecum, identified by                            appendiceal orifice and ileocecal valve. The                            colonoscopy was performed without difficulty. The                            patient tolerated the procedure well. The quality                            of the bowel preparation was good. The ileocecal                            valve, appendiceal orifice, and rectum were                            photographed. The bowel preparation used was Sutab                            via split dose instruction. Scope In: 2:50:15 PM Scope Out: 3:06:03 PM Scope Withdrawal Time: 0 hours 10 minutes 56 seconds  Total Procedure Duration: 0 hours 15 minutes 48 seconds  Findings:                 The perianal and digital rectal examinations were                            normal. Pertinent negatives include normal prostate                            (size, shape, and consistency).                           Two sessile polyps were found in the sigmoid colon                            and transverse colon. The polyps were diminutive in  size. These polyps were removed with a cold biopsy                            forceps. Resection and retrieval were complete.                            Verification of patient identification for the                            specimen was done. Estimated blood loss was minimal.                           Multiple diverticula were found in the sigmoid                            colon.                           The exam was otherwise without abnormality on                            direct and retroflexion views. Complications:            No immediate complications. Estimated Blood Loss:     Estimated blood loss was minimal. Impression:               - Two diminutive polyps in the sigmoid colon and in                            the transverse colon, removed with a cold biopsy                            forceps.  Resected and retrieved.                           - Diverticulosis in the sigmoid colon.                           - The examination was otherwise normal on direct                            and retroflexion views.                           - Personal history of colonic polyps. 2 adenomas                            2016 Recommendation:           - Patient has a contact number available for                            emergencies. The signs and symptoms of potential                            delayed complications were discussed with the  patient. Return to normal activities tomorrow.                            Written discharge instructions were provided to the                            patient.                           - Resume previous diet.                           - Continue present medications.                           - Repeat colonoscopy is recommended. The                            colonoscopy date will be determined after pathology                            results from today's exam become available for                            review. Gatha Mayer, MD 11/07/2019 3:14:30 PM This report has been signed electronically.

## 2019-11-07 NOTE — Progress Notes (Signed)
Pt's states no medical or surgical changes since previsit or office visit. 

## 2019-11-11 ENCOUNTER — Telehealth: Payer: Self-pay | Admitting: Internal Medicine

## 2019-11-11 ENCOUNTER — Telehealth: Payer: Self-pay | Admitting: *Deleted

## 2019-11-11 MED ORDER — CYCLOBENZAPRINE HCL 10 MG PO TABS: ORAL_TABLET | ORAL | 0 refills | Status: DC

## 2019-11-11 NOTE — Telephone Encounter (Signed)
I spoke with Mr Joseph [name] and he said he needs a refill on the cyclobenzaprine as he is out and does feel like it helps. I confirmed the pharmacy and sent a refill in.

## 2019-11-11 NOTE — Telephone Encounter (Signed)
  Follow up Call-  Call back number 11/07/2019 03/14/2019  Post procedure Call Back phone  # April-call daughter [phone]  Permission to leave phone message Yes Yes  Some recent data might be hidden     Patient questions:  Do you have a fever, pain , or abdominal swelling? No. Pain Score  0 *  Have you tolerated food without any problems? Yes.    Have you been able to return to your normal activities? Yes.    Do you have any questions about your discharge instructions: Diet   No. Medications  No. Follow up visit  No.  Do you have questions or concerns about your Care? No.  Actions: * If pain score is 4 or above: No action needed, pain <4.  1. Have you developed a fever since your procedure? no  2.   Have you had an respiratory symptoms (SOB or cough) since your procedure? no  3.   Have you tested positive for COVID 19 since your procedure no  4.   Have you had any family members/close contacts diagnosed with the COVID 19 since your procedure?  no   If yes to any of these questions please route to Joylene John, RN and Joella Prince, RN

## 2019-11-11 NOTE — Telephone Encounter (Signed)
That is an as needed medicine so if he feels like it is helping he can

## 2019-11-11 NOTE — Telephone Encounter (Signed)
Patient just had a procedure on 11/07/2019, please advise Sir.

## 2019-11-16 ENCOUNTER — Encounter: Payer: Self-pay | Admitting: Internal Medicine

## 2019-12-07 ENCOUNTER — Other Ambulatory Visit: Payer: Self-pay | Admitting: Internal Medicine

## 2019-12-07 ENCOUNTER — Other Ambulatory Visit: Payer: Self-pay

## 2019-12-29 ENCOUNTER — Other Ambulatory Visit: Payer: Self-pay | Admitting: Internal Medicine

## 2019-12-29 ENCOUNTER — Other Ambulatory Visit: Payer: Self-pay

## 2020-01-08 ENCOUNTER — Other Ambulatory Visit: Payer: Self-pay | Admitting: Internal Medicine

## 2020-01-18 ENCOUNTER — Other Ambulatory Visit: Payer: Self-pay | Admitting: Internal Medicine

## 2020-02-09 ENCOUNTER — Telehealth: Payer: Self-pay | Admitting: Internal Medicine

## 2020-02-09 ENCOUNTER — Other Ambulatory Visit: Payer: Self-pay | Admitting: Internal Medicine

## 2020-02-09 NOTE — Telephone Encounter (Signed)
Daughter informed that flexeril refill sent in as requested.

## 2020-02-23 ENCOUNTER — Other Ambulatory Visit: Payer: Self-pay | Admitting: Internal Medicine

## 2020-02-23 NOTE — Telephone Encounter (Signed)
May I refill ? 

## 2020-03-02 ENCOUNTER — Other Ambulatory Visit: Payer: Self-pay | Admitting: Internal Medicine

## 2020-03-17 ENCOUNTER — Telehealth: Payer: Self-pay | Admitting: Internal Medicine

## 2020-03-17 ENCOUNTER — Other Ambulatory Visit: Payer: Self-pay | Admitting: Internal Medicine

## 2020-03-17 NOTE — Telephone Encounter (Signed)
April called back and said Dad is still using this medicine prn. He is requesting a refill please.

## 2020-03-17 NOTE — Telephone Encounter (Signed)
Daughter informed

## 2020-03-17 NOTE — Telephone Encounter (Signed)
I spoke with Joseph [name] his daughter and she is going to check with him to see if he needed this refilled.

## 2020-03-17 NOTE — Telephone Encounter (Signed)
Refill x 4 total

## 2020-03-17 NOTE — Telephone Encounter (Signed)
Spoke with Joseph [name] and she said that her Dad is using the flexeril prn and is requesting a refill. Will ask Dr Carlean Purl to advise.

## 2020-04-14 ENCOUNTER — Telehealth: Payer: Self-pay

## 2020-04-14 NOTE — Telephone Encounter (Signed)
The cyclombenzaprine is approved for 03/27/2020-07/13/2020. [facility] pharmacy informed.

## 2020-04-14 NOTE — Telephone Encounter (Signed)
I have started a prior authorization for patient's cyclobenzaprine 10mg  tablets thur [employer]. Will await the outcome.

## 2020-05-05 ENCOUNTER — Encounter (INDEPENDENT_AMBULATORY_CARE_PROVIDER_SITE_OTHER): Payer: Medicare HMO | Admitting: Ophthalmology

## 2020-05-07 LAB — HM DIABETES EYE EXAM

## 2020-05-12 ENCOUNTER — Encounter (INDEPENDENT_AMBULATORY_CARE_PROVIDER_SITE_OTHER): Payer: Medicare HMO | Admitting: Ophthalmology

## 2020-05-12 ENCOUNTER — Other Ambulatory Visit: Payer: Self-pay

## 2020-05-12 DIAGNOSIS — I10 Essential (primary) hypertension: Secondary | ICD-10-CM

## 2020-05-12 DIAGNOSIS — H35033 Hypertensive retinopathy, bilateral: Secondary | ICD-10-CM | POA: Diagnosis not present

## 2020-05-12 DIAGNOSIS — E113513 Type 2 diabetes mellitus with proliferative diabetic retinopathy with macular edema, bilateral: Secondary | ICD-10-CM | POA: Diagnosis not present

## 2020-06-15 ENCOUNTER — Other Ambulatory Visit: Payer: Self-pay | Admitting: Physician Assistant

## 2020-06-15 ENCOUNTER — Other Ambulatory Visit: Payer: Self-pay | Admitting: Internal Medicine

## 2020-06-16 NOTE — Telephone Encounter (Signed)
Okay to refill Sir? 

## 2020-07-07 ENCOUNTER — Encounter: Payer: Self-pay | Admitting: Internal Medicine

## 2020-07-29 ENCOUNTER — Telehealth: Payer: Self-pay | Admitting: *Deleted

## 2020-07-29 NOTE — Telephone Encounter (Signed)
Dr Carlean Purl, I just received a  Fax for a prior authorization  of Flexeril. Do you refill this?

## 2020-07-29 NOTE — Telephone Encounter (Signed)
Yes,  I have prescribed and refilled it - looks like PJ got PA through last month so we should do again  He also needs a next avail f/u me  thanks

## 2020-07-30 NOTE — Telephone Encounter (Signed)
Ill work on prior auth  Im in the office all day with Dr Silverio Decamp but will attempt to get it done today   Did schedule ot a follow up n 09/21/2020 at 10:50am to see Dr Carlean Purl

## 2020-07-30 NOTE — Telephone Encounter (Signed)
Prior authorization done through Cover My Meds waiting on response

## 2020-08-02 NOTE — Telephone Encounter (Signed)
Cyclobenzaprine approved until 03/27/2020-10/28/2020

## 2020-08-02 NOTE — Telephone Encounter (Signed)
I have faxed the approval fax to the pharmacy and I included a cover sheet for them to let him know about his appointment in June.

## 2020-08-10 ENCOUNTER — Telehealth: Payer: Self-pay | Admitting: Internal Medicine

## 2020-08-10 MED ORDER — NITROGLYCERIN 0.4 MG SL SUBL: 0.4000 mg | SUBLINGUAL_TABLET | SUBLINGUAL | 3 refills | Status: AC | PRN

## 2020-08-10 NOTE — Telephone Encounter (Signed)
Pt c/o medication issue:  1. Name of Medication: Nitroglyverin  2. How are you currently taking this medication (dosage and times per day)? Not currently taken  3. Are you having a reaction (difficulty breathing--STAT)? No   4. What is your medication issue?PT is calling requesting a prescription for this medication.He states he is leaving for [place] and wants to make sure he has it.[facility]alos staed that his PCP prescribed but they will no longer refill he will have to go his heart doctor

## 2020-08-10 NOTE — Telephone Encounter (Signed)
Ok to Rx for 0.4 mg SL nitro PRN /chest pain, 20 tabs - take 1 q5 mins pRN  Dr Lemmie Evens

## 2020-08-10 NOTE — Telephone Encounter (Signed)
Spoke with the patient's daughter who states that the patient's old PCP used to prescribe him nitroglycerin. The daughter states that the prescription has expired and his new PCP won't prescribe it. She states that the patient has never had to use it and is not currently having any active chest pain or any other cardiac symptoms. The patient is leaving this weekend to go to [place] for one month and wanted to have a prescription to take with him just in case. They would like to know if Dr. Debara Pickett would prescribe it.

## 2020-08-10 NOTE — Telephone Encounter (Signed)
Spoke with the patient's daughter and advised her that a prescription will be sent in for nitroglycerin. Patient is aware of how and when to use it.

## 2020-09-21 ENCOUNTER — Ambulatory Visit: Payer: Medicare HMO | Admitting: Internal Medicine

## 2020-10-05 ENCOUNTER — Telehealth: Payer: Self-pay | Admitting: Internal Medicine

## 2020-10-05 ENCOUNTER — Other Ambulatory Visit: Payer: Self-pay | Admitting: Internal Medicine

## 2020-10-05 NOTE — Telephone Encounter (Signed)
I called his daughter back and she said Dad is doing well on the cyclobenzaprine he is just out. I told her Dr Carlean Purl is at the hospital and will address this as soon as he can.

## 2020-10-05 NOTE — Telephone Encounter (Signed)
Inbound call from pt's daughter requesting a call back stating that the pt needed refills on cyclobenzaprine. Please advise. Thanks.

## 2020-10-05 NOTE — Telephone Encounter (Signed)
I spoke with his daughter and he has a late August appointment. Refilled generic flexeril.

## 2020-10-05 NOTE — Telephone Encounter (Signed)
Please advise Sir, thank you. 

## 2020-10-27 ENCOUNTER — Other Ambulatory Visit: Payer: Self-pay | Admitting: Physician Assistant

## 2020-10-27 ENCOUNTER — Other Ambulatory Visit: Payer: Self-pay | Admitting: Internal Medicine

## 2020-11-04 ENCOUNTER — Other Ambulatory Visit: Payer: Self-pay | Admitting: Internal Medicine

## 2020-11-04 ENCOUNTER — Telehealth: Payer: Self-pay

## 2020-11-04 NOTE — Telephone Encounter (Signed)
I have started a prior authorization thru [place] for patient's cyclobenzaprine. They said to check back later to answer more questions.

## 2020-11-05 NOTE — Telephone Encounter (Signed)
The cyclobenzaprine has been approved and [facility] informed.

## 2020-11-09 ENCOUNTER — Other Ambulatory Visit: Payer: Self-pay

## 2020-11-09 ENCOUNTER — Encounter (INDEPENDENT_AMBULATORY_CARE_PROVIDER_SITE_OTHER): Payer: Medicare HMO | Admitting: Ophthalmology

## 2020-11-09 DIAGNOSIS — I10 Essential (primary) hypertension: Secondary | ICD-10-CM

## 2020-11-09 DIAGNOSIS — E113513 Type 2 diabetes mellitus with proliferative diabetic retinopathy with macular edema, bilateral: Secondary | ICD-10-CM | POA: Diagnosis not present

## 2020-11-09 DIAGNOSIS — H35033 Hypertensive retinopathy, bilateral: Secondary | ICD-10-CM

## 2020-11-18 ENCOUNTER — Ambulatory Visit (INDEPENDENT_AMBULATORY_CARE_PROVIDER_SITE_OTHER): Payer: Medicare HMO | Admitting: Internal Medicine

## 2020-11-18 ENCOUNTER — Encounter: Payer: Self-pay | Admitting: Internal Medicine

## 2020-11-18 VITALS — BP 112/74 | HR 65 | Ht 65.75 in | Wt 197.0 lb

## 2020-11-18 DIAGNOSIS — K219 Gastro-esophageal reflux disease without esophagitis: Secondary | ICD-10-CM

## 2020-11-18 DIAGNOSIS — K3184 Gastroparesis: Secondary | ICD-10-CM

## 2020-11-18 DIAGNOSIS — K317 Polyp of stomach and duodenum: Secondary | ICD-10-CM

## 2020-11-18 DIAGNOSIS — E1143 Type 2 diabetes mellitus with diabetic autonomic (poly)neuropathy: Secondary | ICD-10-CM | POA: Diagnosis not present

## 2020-11-18 NOTE — Progress Notes (Signed)
Joseph [name] 65 y.o. Aug 21, 1955 [id number]  Assessment & Plan:   Encounter Diagnoses  Name Primary?   Gastric polyps - hyperplastic Yes   Gastroesophageal reflux disease, unspecified whether esophagitis present    Diabetic gastroparesis ([place])      The patient does well on his current regimen of omeprazole and as needed cyclobenzaprine.  He was placed on dicyclomine by one of my advanced practitioner colleagues earlier this year its not clear to me he needs that so he will try to stop it.   He needs an EGD for surveillance of hyperplastic gastric polyps and we will set this up.  The risks and benefits as well as alternatives of endoscopic procedure(s) have been discussed and reviewed. All questions answered. The patient agrees to proceed.  Subjective:   Chief Complaint: GERD gastroparesis  HPI Patient presents for follow-up of GERD gastroparesis and he also has a history of gastric polyps hyperplastic.  I had anticipated he have a follow-up EGD 1 year after his last and that was in August 2021 where he had some hyperplastic polyps.  He does well on his omeprazole he ran out of it of her trip to [place] and got back on it and has no symptoms.  He intermittently uses cyclobenzaprine for some abdominal pains I thought were related to his gastroparesis.  He is not having side effects from that.  He was placed on dicyclomine earlier this year by one of my advanced practice colleagues for reasons that are unclear to him.  He is Spanish-speaking mostly he does have comprehension of some English he is assisted in the visit with his daughter or daughter-in-law who speaks fluent [place] and [place].  Allergies  Allergen Reactions   Robaxin [Methocarbamol] Itching and Rash   Current Meds  Medication Sig   aspirin 81 MG tablet Take 81 mg by mouth daily.   cholecalciferol (VITAMIN D) 1000 UNITS tablet Take 1,000 Units by mouth daily.   cyclobenzaprine (FLEXERIL) 10 MG tablet TAKE 1  TABLET BY MOUTH THREE TIMES A DAY AS NEEDED FOR MUSCLE SPASMS   dicyclomine (BENTYL) 10 MG capsule TAKE 1 CAPSULE (10 MG TOTAL) BY MOUTH 2 (TWO) TIMES DAILY.   famotidine (PEPCID) 40 MG tablet TAKE 1 TABLET BY MOUTH EVERYDAY AT BEDTIME   fenofibrate 160 MG tablet Take 160 mg by mouth daily.   ferrous sulfate 325 (65 FE) MG tablet Take 325 mg by mouth 2 (two) times daily.   furosemide (LASIX) 20 MG tablet TAKE 1 TABLET (20 MG TOTAL) BY MOUTH DAILY. PLEASE CONTACT OFFICE FOR APPOINTMENT NEED OV.   Insulin Degludec (TRESIBA FLEXTOUCH [place]) Inject 50 Units into the skin daily.   lisinopril (PRINIVIL,ZESTRIL) 20 MG tablet TAKE 1 TABLET (20 MG TOTAL) BY MOUTH DAILY.   metoprolol tartrate (LOPRESSOR) 25 MG tablet TAKE 1 TABLET (25 MG TOTAL) BY MOUTH 2 (TWO) TIMES DAILY.   montelukast (SINGULAIR) 10 MG tablet Take 1 tablet by mouth daily.   nitroGLYCERIN (NITROSTAT) 0.4 MG SL tablet Place 1 tablet (0.4 mg total) under the tongue every 5 (five) minutes as needed for chest pain.   NOVOLOG FLEXPEN 100 UNIT/ML FlexPen Inject 17-20 Units into the skin 3 (three) times daily.   omeprazole (PRILOSEC) 20 MG capsule Take 40 mg by mouth daily.    pravastatin (PRAVACHOL) 40 MG tablet PATIENT NEED TO SCHEDULE APPOINTMENT FOR FUTURE REFILLS. 1ST ATTEMPT. TAKE 1 TABLET BY MOUTH EVERY DAY   SYMBICORT 160-4.5 MCG/ACT inhaler Inhale 1 puff into the lungs  2 (two) times a day.   Past Medical History:  Diagnosis Date   Anemia    Anxiety    Arthritis    BPH (benign prostatic hypertrophy)    Cataract    Chronic renal insufficiency, stage III (moderate) ([employer])    [place] [facility]   Coronary artery disease    Dr Debara Pickett    Depression    Diabetes mellitus without complication ([place]) [id number]   insulin dependent   Diabetic autonomic neuropathy ([place]) 03/19/2019   Gastric polyps - hyperplastic 03/19/2019   Gastroparesis due to DM ([facility])    GERD (gastroesophageal reflux disease)    Hx of adenomatous colonic polyps 06/11/2014    Hyperlipidemia    Hypertension    Myocardial infarction ([place])    Neuropathy    PVD (peripheral vascular disease) ([place])    Sleep apnea 2010   refuses to use cpap   Past Surgical History:  Procedure Laterality Date   ANKLE FRACTURE SURGERY     CARDIAC CATHETERIZATION  01/30/2012   This demonstrated proximal to mid LAD disease and disease of 2 large OM vessels concerning for surgical disease.   COLONOSCOPY     CORONARY ARTERY BYPASS GRAFT  02/06/2012   Procedure: CORONARY ARTERY BYPASS GRAFTING (CABG); 5- Vessel bypass a LIMA to the LAD, a saphenous vein to the to the diagonal , a saphenous vein to the distal OM, and sequential reverse saphenous vien graft to the posterior descending and 2nd OM. Surgeon: Grace Isaac, MD;  Location: [facility];  Service: Open Heart Surgery;  Laterality: [name];   ESOPHAGOGASTRODUODENOSCOPY  2005   LEFT HEART CATHETERIZATION WITH CORONARY ANGIOGRAM [name] 01/30/2012   Procedure: LEFT HEART CATHETERIZATION WITH CORONARY ANGIOGRAM;  Surgeon: Pixie Casino, MD;  Location: [facility] CATH LAB;  Service: Cardiovascular;  Laterality: [name];   UPPER GASTROINTESTINAL ENDOSCOPY     Social History   Social History Narrative   Widowed, 1 son and 4 daughters   Disabled and dual eligible Medicare and Medicaid   One caffeinated beverage daily   family history includes Cancer in his sister; Diabetes in his father; Hypertension in his father.   Review of Systems As per HPI  Objective:   Physical Exam '@BP'$  112/74   Pulse 65   Ht 5' 5.75" (1.67 m)   Wt 197 lb (89.4 kg)   SpO2 99%   BMI 32.04 kg/m @  General:  NAD Eyes:   anicteric Lungs:  clear Heart::  S1S2 no rubs, murmurs or gallops Abdomen:  soft and nontender, BS+ Ext:   no edema, cyanosis or clubbing    Data Reviewed:  See HPI

## 2020-11-18 NOTE — Patient Instructions (Signed)
You have been scheduled for an endoscopy. Please follow written instructions given to you at your visit today. If you use inhalers (even only as needed), please bring them with you on the day of your procedure.  Hold taking your dicyclomine and see if you can tell any difference.  I appreciate the opportunity to care for you. Silvano Rusk, MD, [facility]

## 2020-11-26 ENCOUNTER — Other Ambulatory Visit: Payer: Self-pay | Admitting: Internal Medicine

## 2020-12-01 DIAGNOSIS — H9201 Otalgia, right ear: Secondary | ICD-10-CM | POA: Insufficient documentation

## 2020-12-23 ENCOUNTER — Other Ambulatory Visit: Payer: Self-pay

## 2020-12-23 ENCOUNTER — Encounter: Payer: Self-pay | Admitting: Internal Medicine

## 2020-12-23 ENCOUNTER — Ambulatory Visit (AMBULATORY_SURGERY_CENTER): Payer: Medicare HMO | Admitting: Internal Medicine

## 2020-12-23 VITALS — BP 145/77 | HR 65 | Temp 97.1°F | Resp 17 | Ht 65.75 in | Wt 197.0 lb

## 2020-12-23 DIAGNOSIS — K317 Polyp of stomach and duodenum: Secondary | ICD-10-CM

## 2020-12-23 DIAGNOSIS — K259 Gastric ulcer, unspecified as acute or chronic, without hemorrhage or perforation: Secondary | ICD-10-CM

## 2020-12-23 DIAGNOSIS — K2970 Gastritis, unspecified, without bleeding: Secondary | ICD-10-CM | POA: Diagnosis not present

## 2020-12-23 MED ORDER — SODIUM CHLORIDE 0.9 % IV SOLN: 500.0000 mL | INTRAVENOUS | Status: DC

## 2020-12-23 NOTE — Patient Instructions (Addendum)
I saw and sampled the polyps again today. They still look benign and innocent.  I will let you know results and plans.   I appreciate the opportunity to care for you. Gatha Mayer, MD, FACG   YOU HAD AN ENDOSCOPIC PROCEDURE TODAY AT [facility]:   Refer to the procedure report that was given to you for any specific questions about what was found during the examination.  If the procedure report does not answer your questions, please call your gastroenterologist to clarify.  If you requested that your care partner not be given the details of your procedure findings, then the procedure report has been included in a sealed envelope for you to review at your convenience later.  YOU SHOULD EXPECT: Some feelings of bloating in the abdomen. Passage of more gas than usual.  Walking can help get rid of the air that was put into your GI tract during the procedure and reduce the bloating. If you had a lower endoscopy (such as a colonoscopy or flexible sigmoidoscopy) you may notice spotting of blood in your stool or on the toilet paper. If you underwent a bowel prep for your procedure, you may not have a normal bowel movement for a few days.  Please Note:  You might notice some irritation and congestion in your nose or some drainage.  This is from the oxygen used during your procedure.  There is no need for concern and it should clear up in a day or so.  SYMPTOMS TO REPORT IMMEDIATELY:  Following upper endoscopy (EGD)  Vomiting of blood or coffee ground material  New chest pain or pain under the shoulder blades  Painful or persistently difficult swallowing  New shortness of breath  Fever of 100F or higher  Black, tarry-looking stools  For urgent or emergent issues, a gastroenterologist can be reached at any hour by calling [phone]. Do not use MyChart messaging for urgent concerns.    DIET:  We do recommend a small meal at first, but then you may proceed to your regular  diet.  Drink plenty of fluids but you should avoid alcoholic beverages for 24 hours.  ACTIVITY:  You should plan to take it easy for the rest of today and you should NOT DRIVE or use heavy machinery until tomorrow (because of the sedation medicines used during the test).    FOLLOW UP: Our staff will call the number listed on your records 48-72 hours following your procedure to check on you and address any questions or concerns that you may have regarding the information given to you following your procedure. If we do not reach you, we will leave a message.  We will attempt to reach you two times.  During this call, we will ask if you have developed any symptoms of COVID 19. If you develop any symptoms (ie: fever, flu-like symptoms, shortness of breath, cough etc.) before then, please call [phone].  If you test positive for Covid 19 in the 2 weeks post procedure, please call and report this information to [place].    If any biopsies were taken you will be contacted by phone or by letter within the next 1-3 weeks.  Please call us at [phone] if you have not heard about the biopsies in 3 weeks.    SIGNATURES/CONFIDENTIALITY: You and/or your care partner have signed paperwork which will be entered into your electronic medical record.  These signatures attest to the fact that that the information above on your  After Visit Summary has been reviewed and is understood.  Full responsibility of the confidentiality of this discharge information lies with you and/or your care-partner.

## 2020-12-23 NOTE — Progress Notes (Signed)
[facility] History and Physical   Primary Care Physician:  Leeanne Rio, MD   Reason for Procedure:   F/u gastric polyps  Plan:    EGD     HPI: Joseph [name] is a 65 y.o. male w/ hx hyperplastic gastric polyps here for a f/u EGD. See note of 11/18/20 for additional info but he is stable.   Past Medical History:  Diagnosis Date   Anemia    Anxiety    Arthritis    BPH (benign prostatic hypertrophy)    Cataract    Chronic renal insufficiency, stage III (moderate) ([place])    [place] [facility]   Coronary artery disease    Dr Debara Pickett    Depression    Diabetes mellitus without complication ([place]) 7209   insulin dependent   Diabetic autonomic neuropathy ([place]) 03/19/2019   Gastric polyps - hyperplastic 03/19/2019   Gastroparesis due to DM (HCC)    GERD (gastroesophageal reflux disease)    Hx of adenomatous colonic polyps 06/11/2014   Hyperlipidemia    Hypertension    Myocardial infarction ([place])    Neuropathy    PVD (peripheral vascular disease) ([place])    Sleep apnea 2010   refuses to use cpap    Past Surgical History:  Procedure Laterality Date   ANKLE FRACTURE SURGERY     CARDIAC CATHETERIZATION  01/30/2012   This demonstrated proximal to mid LAD disease and disease of 2 large OM vessels concerning for surgical disease.   COLONOSCOPY     CORONARY ARTERY BYPASS GRAFT  02/06/2012   Procedure: CORONARY ARTERY BYPASS GRAFTING (CABG); 5- Vessel bypass a LIMA to the LAD, a saphenous vein to the to the diagonal , a saphenous vein to the distal OM, and sequential reverse saphenous vien graft to the posterior descending and 2nd OM. Surgeon: Grace Isaac, MD;  Location: [facility];  Service: Open Heart Surgery;  Laterality: [name];   ESOPHAGOGASTRODUODENOSCOPY  2005   LEFT HEART CATHETERIZATION WITH CORONARY ANGIOGRAM [name] 01/30/2012   Procedure: LEFT HEART CATHETERIZATION WITH CORONARY ANGIOGRAM;  Surgeon: Pixie Casino, MD;  Location: [facility] CATH LAB;  Service:  Cardiovascular;  Laterality: [name];   UPPER GASTROINTESTINAL ENDOSCOPY      Prior to Admission medications   Medication Sig Start Date End Date Taking? Authorizing Provider  aspirin 81 MG chewable tablet Chew by mouth.   Yes [provider]  B-D UF III MINI PEN NEEDLES 31G X 5 MM MISC Inject into the skin 3 (three) times daily. 12/06/20  Yes [provider]  cholecalciferol (VITAMIN D) 1000 UNITS tablet Take 1,000 Units by mouth daily.   Yes [provider]  dicyclomine (BENTYL) 10 MG capsule TAKE 1 CAPSULE (10 MG TOTAL) BY MOUTH 2 (TWO) TIMES DAILY. 06/16/20  Yes Levin Erp, PA  DULoxetine (CYMBALTA) 30 MG capsule Take by mouth. 06/16/20  Yes [provider]  ergocalciferol (VITAMIN D2) 1.25 MG (50000 UT) capsule TAKE ONE CAPSULY BY MOUTH ONCE A WEEK 04/09/20  Yes [provider]  famotidine (PEPCID) 40 MG tablet TAKE 1 TABLET BY MOUTH EVERYDAY AT BEDTIME 10/28/20  Yes Gatha Mayer, MD  FARXIGA 10 MG TABS tablet Take 10 mg by mouth daily. 12/14/20  Yes [provider]  fenofibrate 160 MG tablet Take 1 tablet by mouth daily. 10/28/20  Yes [provider]  ferrous sulfate 325 (65 FE) MG tablet Take 325 mg by mouth 2 (two) times daily.   Yes [provider]  furosemide (  LASIX) 20 MG tablet TAKE 1 TABLET (20 MG TOTAL) BY MOUTH DAILY. PLEASE CONTACT OFFICE FOR APPOINTMENT NEED OV. 05/01/17  Yes [name], Nadean Corwin, MD  glucose blood (ONETOUCH VERIO) test strip CHECK SUGARS 4 TIMES A DAY DX E11.9 11/26/20  Yes [provider]  Insulin Degludec (TRESIBA FLEXTOUCH [place]) Inject 40 Units into the skin daily.   Yes [provider]  Insulin Pen Needle (B-D UF III MINI PEN NEEDLES) 31G X 5 MM MISC CHECK THREE TIMES DAILY DX E11.9 05/10/20  Yes [provider]  Insulin Pen Needle (B-D UF III MINI PEN NEEDLES) 31G X 5 MM MISC CHECK THREE TIMES DAILY DX E11.9 05/10/20  Yes [provider]  Lancets (ONETOUCH  DELICA PLUS [id number]) [place] Apply topically. 12/10/20  Yes [provider]  levocetirizine (XYZAL) 5 MG tablet SMARTSIG:1 Tablet(s) By Mouth Every Evening 10/05/20  Yes [provider]  lisinopril (PRINIVIL,ZESTRIL) 20 MG tablet TAKE 1 TABLET (20 MG TOTAL) BY MOUTH DAILY. 04/20/14  Yes [name], Nadean Corwin, MD  lisinopril (ZESTRIL) 20 MG tablet Take by mouth. 03/03/20  Yes [provider]  metoprolol tartrate (LOPRESSOR) 25 MG tablet TAKE 1 TABLET (25 MG TOTAL) BY MOUTH 2 (TWO) TIMES DAILY. 04/06/14  Yes Lorretta Harp, MD  montelukast (SINGULAIR) 10 MG tablet Take 1 tablet by mouth daily. 06/13/18  Yes [provider]  NOVOLOG FLEXPEN 100 UNIT/ML FlexPen Inject 17-20 Units into the skin 3 (three) times daily. 12/27/18  Yes [provider]  omeprazole (PRILOSEC) 40 MG capsule Take 40 mg by mouth daily. 10/26/20  Yes [provider]  [facility] VERIO test strip 4 (four) times daily. 11/26/20  Yes [provider]  pravastatin (PRAVACHOL) 40 MG tablet PATIENT NEED TO SCHEDULE APPOINTMENT FOR FUTURE REFILLS. 1ST ATTEMPT. TAKE 1 TABLET BY MOUTH EVERY DAY 10/27/20  Yes [name], Nadean Corwin, MD  cyclobenzaprine (FLEXERIL) 10 MG tablet TAKE 1 TABLET BY MOUTH THREE TIMES A DAY AS NEEDED FOR MUSCLE SPASMS 11/26/20   Gatha Mayer, MD  nitroGLYCERIN (NITROSTAT) 0.4 MG SL tablet Place 1 tablet (0.4 mg total) under the tongue every 5 (five) minutes as needed for chest pain. 08/10/20   Pixie Casino, MD    Current Outpatient Medications  Medication Sig Dispense Refill   aspirin 81 MG chewable tablet Chew by mouth.     B-D UF III MINI PEN NEEDLES 31G X 5 MM MISC Inject into the skin 3 (three) times daily.     cholecalciferol (VITAMIN D) 1000 UNITS tablet Take 1,000 Units by mouth daily.     dicyclomine (BENTYL) 10 MG capsule TAKE 1 CAPSULE (10 MG TOTAL) BY MOUTH 2 (TWO) TIMES DAILY. 180 capsule 3   DULoxetine (CYMBALTA) 30 MG capsule Take by mouth.      ergocalciferol (VITAMIN D2) 1.25 MG (50000 UT) capsule TAKE ONE CAPSULY BY MOUTH ONCE A WEEK     famotidine (PEPCID) 40 MG tablet TAKE 1 TABLET BY MOUTH EVERYDAY AT BEDTIME 90 tablet 3   FARXIGA 10 MG TABS tablet Take 10 mg by mouth daily.     fenofibrate 160 MG tablet Take 1 tablet by mouth daily.     ferrous sulfate 325 (65 FE) MG tablet Take 325 mg by mouth 2 (two) times daily.     furosemide (LASIX) 20 MG tablet TAKE 1 TABLET (20 MG TOTAL) BY MOUTH DAILY. PLEASE CONTACT OFFICE FOR APPOINTMENT NEED OV. 30 tablet 0   glucose blood (ONETOUCH VERIO) test strip CHECK SUGARS  4 TIMES A DAY DX E11.9     Insulin Degludec (TRESIBA FLEXTOUCH [place]) Inject 40 Units into the skin daily.     Insulin Pen Needle (B-D UF III MINI PEN NEEDLES) 31G X 5 MM MISC CHECK THREE TIMES DAILY DX E11.9     Insulin Pen Needle (B-D UF III MINI PEN NEEDLES) 31G X 5 MM MISC CHECK THREE TIMES DAILY DX E11.9     Lancets (ONETOUCH DELICA PLUS [id number]) MISC Apply topically.     levocetirizine (XYZAL) 5 MG tablet SMARTSIG:1 Tablet(s) By Mouth Every Evening     lisinopril (PRINIVIL,ZESTRIL) 20 MG tablet TAKE 1 TABLET (20 MG TOTAL) BY MOUTH DAILY. 90 tablet 3   lisinopril (ZESTRIL) 20 MG tablet Take by mouth.     metoprolol tartrate (LOPRESSOR) 25 MG tablet TAKE 1 TABLET (25 MG TOTAL) BY MOUTH 2 (TWO) TIMES DAILY. 60 tablet 9   montelukast (SINGULAIR) 10 MG tablet Take 1 tablet by mouth daily.     NOVOLOG FLEXPEN 100 UNIT/ML FlexPen Inject 17-20 Units into the skin 3 (three) times daily.     omeprazole (PRILOSEC) 40 MG capsule Take 40 mg by mouth daily.     ONETOUCH VERIO test strip 4 (four) times daily.     pravastatin (PRAVACHOL) 40 MG tablet PATIENT NEED TO SCHEDULE APPOINTMENT FOR FUTURE REFILLS. 1ST ATTEMPT. TAKE 1 TABLET BY MOUTH EVERY DAY 30 tablet 0   cyclobenzaprine (FLEXERIL) 10 MG tablet TAKE 1 TABLET BY MOUTH THREE TIMES A DAY AS NEEDED FOR MUSCLE SPASMS 60 tablet 0   nitroGLYCERIN (NITROSTAT) 0.4 MG SL tablet Place 1  tablet (0.4 mg total) under the tongue every 5 (five) minutes as needed for chest pain. 20 tablet 3   Current Facility-Administered Medications  Medication Dose Route Frequency Provider Last Rate Last Admin   0.9 %  sodium chloride infusion  500 mL Intravenous Continuous Gatha Mayer, MD        Allergies as of 12/23/2020 - Review Complete 12/23/2020  Allergen Reaction Noted   Metformin Diarrhea 07/17/2019   Robaxin [methocarbamol] Itching and Rash 03/11/2019    Family History  Problem Relation Age of Onset   Diabetes Father    Hypertension Father    Cancer Sister        unknown   Colon cancer Neg Hx    Esophageal cancer Neg Hx    Rectal cancer Neg Hx    Stomach cancer Neg Hx    Pancreatic cancer Neg Hx     Social History   Socioeconomic History   Marital status: Widowed    Spouse name: Not on file   Number of children: 4   Years of education: Not on file   Highest education level: Not on file  Occupational History   Occupation: disabled  Tobacco Use   Smoking status: Never   Smokeless tobacco: Never  Vaping Use   Vaping Use: Never used  Substance and Sexual Activity   Alcohol use: No   Drug use: No   Sexual activity: Not on file  Other Topics Concern   Not on file  Social History Narrative   Widowed, 1 son and 4 daughters   Disabled and dual eligible Medicare and Medicaid   One caffeinated beverage daily    Review of Systems:  All other review of systems negative except as mentioned in the HPI.  Physical Exam: Vital signs BP (!) 146/77   Pulse 70   Temp (!) 97.1 F (36.2 C) (Temporal)  Ht 5' 5.75" (1.67 m)   Wt 197 lb (89.4 kg)   SpO2 100%   BMI 32.04 kg/m   General:   Alert,  Well-developed, well-nourished, pleasant and cooperative in NAD Lungs:  Clear throughout to auscultation.   Heart:  Regular rate and rhythm; no murmurs, clicks, rubs,  or gallops. Abdomen:  Soft, nontender and nondistended. Normal bowel sounds.   Neuro/Psych:  Alert  and cooperative. Normal mood and affect. A and O x 3   @Maddyx Wieck  Simonne Maffucci, MD, [facility] [phone] (pager) 12/23/2020 10:05 AM@

## 2020-12-23 NOTE — Progress Notes (Signed)
Vss nad transferred to pacu 

## 2020-12-23 NOTE — Op Note (Signed)
[facility] Patient Name: Joseph [name] Procedure Date: 12/23/2020 10:02 AM MRN: [id number] Endoscopist: Gatha Mayer , MD Age: 65 Referring MD:  Date of Birth: Jan 18, 1956 Gender: Male Account #: [id number] Procedure:                Upper GI endoscopy Indications:              Gastric polyps, Follow-up of gastric polyps Medicines:                Monitored Anesthesia Care Procedure:                Pre-Anesthesia Assessment:                           - Prior to the procedure, a History and Physical                            was performed, and patient medications and                            allergies were reviewed. The patient's tolerance of                            previous anesthesia was also reviewed. The risks                            and benefits of the procedure and the sedation                            options and risks were discussed with the patient.                            All questions were answered, and informed consent                            was obtained. Prior Anticoagulants: The patient has                            taken no previous anticoagulant or antiplatelet                            agents. ASA Grade Assessment: III - A patient with                            severe systemic disease. After reviewing the risks                            and benefits, the patient was deemed in                            satisfactory condition to undergo the procedure.                           After obtaining informed consent, the endoscope was  passed under direct vision. Throughout the                            procedure, the patient's blood pressure, pulse, and                            oxygen saturations were monitored continuously. The                            GIF [id number] #[id number] was introduced through the                            mouth, and advanced to the second part of duodenum.                            The upper  GI endoscopy was accomplished without                            difficulty. The patient tolerated the procedure                            well. Scope In: Scope Out: Findings:                 Multiple 1 to 7 mm pedunculated and sessile polyps                            with stigmata of recent bleeding were found in the                            cardia and in the gastric body. Biopsies were taken                            with a cold forceps for histology. Verification of                            patient identification for the specimen was done.                            Estimated blood loss was minimal.                           Inflammation was found in the gastric antrum.                           The cardia and gastric fundus were normal on                            retroflexion.                           The exam was otherwise without abnormality. Complications:            No immediate complications. Estimated Blood Loss:     Estimated blood loss was minimal. Impression:               -  Multiple gastric polyps. Biopsied. all ook                            benign. I decided to take biopsies again. i think                            might be a good candidate for banding of polyps +/-                            snaring depedning upon pathology (were hyperplastic                            and fundic gland in past)                           - Gastritis.                           - The examination was otherwise normal. Recommendation:           - Patient has a contact number available for                            emergencies. The signs and symptoms of potential                            delayed complications were discussed with the                            patient. Return to normal activities tomorrow.                            Written discharge instructions were provided to the                            patient.                           - Resume previous diet.                            - Continue present medications.                           - Await pathology results. Gatha Mayer, MD 12/23/2020 10:36:01 AM This report has been signed electronically.

## 2020-12-23 NOTE — Progress Notes (Signed)
Called to room to assist during endoscopic procedure.  Patient ID and intended procedure confirmed with present staff. Received instructions for my participation in the procedure from the performing physician.  

## 2020-12-27 ENCOUNTER — Other Ambulatory Visit: Payer: Self-pay | Admitting: Internal Medicine

## 2020-12-27 ENCOUNTER — Telehealth: Payer: Self-pay

## 2020-12-27 NOTE — Telephone Encounter (Signed)
Please advise Sir, thank you. 

## 2020-12-27 NOTE — Telephone Encounter (Signed)
  Follow up Call-  Call back number 12/23/2020 11/07/2019 03/14/2019  Post procedure Call Back phone  # [phone] April-call daughter [phone]  Permission to leave phone message Yes Yes Yes  Some recent data might be hidden     Patient questions:  Do you have a fever, pain , or abdominal swelling? No. Pain Score  0 *  Have you tolerated food without any problems? Yes.    Have you been able to return to your normal activities? Yes.    Do you have any questions about your discharge instructions: Diet   No. Medications  No. Follow up visit  No.  Do you have questions or concerns about your Care? No.  Actions: * If pain score is 4 or above: No action needed, pain <4.

## 2020-12-28 ENCOUNTER — Other Ambulatory Visit: Payer: Self-pay

## 2020-12-28 MED ORDER — CYCLOBENZAPRINE HCL 10 MG PO TABS: 10.0000 mg | ORAL_TABLET | Freq: Three times a day (TID) | ORAL | 5 refills | Status: DC | PRN

## 2020-12-28 NOTE — Telephone Encounter (Signed)
Rx sent 

## 2020-12-30 ENCOUNTER — Encounter: Payer: Self-pay | Admitting: Internal Medicine

## 2021-01-06 ENCOUNTER — Other Ambulatory Visit: Payer: Self-pay | Admitting: Internal Medicine

## 2021-01-17 ENCOUNTER — Other Ambulatory Visit: Payer: Self-pay | Admitting: Internal Medicine

## 2021-01-25 ENCOUNTER — Other Ambulatory Visit: Payer: Self-pay | Admitting: Internal Medicine

## 2021-02-01 ENCOUNTER — Ambulatory Visit (INDEPENDENT_AMBULATORY_CARE_PROVIDER_SITE_OTHER): Payer: Medicare HMO | Admitting: Podiatry

## 2021-02-01 ENCOUNTER — Encounter: Payer: Self-pay | Admitting: Podiatry

## 2021-02-01 ENCOUNTER — Other Ambulatory Visit: Payer: Self-pay

## 2021-02-01 DIAGNOSIS — Z981 Arthrodesis status: Secondary | ICD-10-CM | POA: Diagnosis not present

## 2021-02-01 DIAGNOSIS — E108 Type 1 diabetes mellitus with unspecified complications: Secondary | ICD-10-CM

## 2021-02-01 DIAGNOSIS — F32A Depression, unspecified: Secondary | ICD-10-CM | POA: Insufficient documentation

## 2021-02-01 DIAGNOSIS — M2141 Flat foot [pes planus] (acquired), right foot: Secondary | ICD-10-CM

## 2021-02-01 DIAGNOSIS — M2142 Flat foot [pes planus] (acquired), left foot: Secondary | ICD-10-CM

## 2021-02-01 DIAGNOSIS — K2100 Gastro-esophageal reflux disease with esophagitis, without bleeding: Secondary | ICD-10-CM | POA: Insufficient documentation

## 2021-02-02 NOTE — Progress Notes (Signed)
  Subjective:  Patient ID: Joseph [name], male    DOB: 07-30-1955,  MRN: [id number]  Chief Complaint  Patient presents with   Diabetes    (np) diabetic-blisters on the bottom.    65 y.o. male presents with the above complaint. History confirmed with patient.  Here for diabetic foot exam he developed blisters while he was in [place] few months ago but this is better now.  His A1c is 6.7%.  He has a history of ankle fusion on the right side and wears a double upright brace this is sometimes uncomfortable and difficult to wear  Objective:  Physical Exam: warm, good capillary refill, no trophic changes or ulcerative lesions, normal DP and PT pulses, normal monofilament exam, and normal sensory exam.  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 Pes planus of both feet   2. S/P ankle arthrodesis   3. Type 1 diabetes mellitus with complication (HCC)      Plan:  Patient was evaluated and treated and all questions answered.  Patient educated on diabetes. Discussed proper diabetic foot care and discussed risks and complications of disease. Educated patient in depth on reasons to return to the office immediately should he/she discover anything concerning or new on the feet. All questions answered. Discussed proper shoes as well.   I think would benefit from new extra-depth diabetic shoes considering his pes planus deformity as well as his fused ankle on the right side.  He may need a different type of brace and I will see if our new orthotist can offer him something that may be easier to put on and use and transfer shoe to shoe compared to the double upright.  He was scheduled for this in January  Return in about 1 year (around 02/01/2022) for diabetic foot examination .

## 2021-02-04 ENCOUNTER — Telehealth: Payer: Self-pay | Admitting: Internal Medicine

## 2021-02-04 ENCOUNTER — Other Ambulatory Visit: Payer: Self-pay | Admitting: Internal Medicine

## 2021-02-09 ENCOUNTER — Telehealth: Payer: Self-pay | Admitting: Internal Medicine

## 2021-02-09 NOTE — Telephone Encounter (Signed)
Spoke with Pt Daughter: Dalia. Dalia stated that there pharmacy stated that the pt needs PA. [facility] contacted and stated that they will fax paper work over.

## 2021-02-09 NOTE — Telephone Encounter (Signed)
Patients daughter called about his cyclobenzaprine medication states that it is needing prior authorization. Requested a call back at [phone]

## 2021-02-10 NOTE — Telephone Encounter (Signed)
Pt insurance company was contacted and discussed the PA for the Cyclobenzaprine. PA submitted through Cover My Meds. Pt daughter made aware that the PA was submitted and that we are awaiting approval Dalia Verbalized understanding with all questions answered.

## 2021-02-10 NOTE — Telephone Encounter (Signed)
Patient's daughter, Regenia Skeeter, called back to state [facility] only gave her father  a 60-day supply of medication instead of the normal 90-day supply and was questioning why because they would probably run out of the medication before it was due for a refill.  Can you please call patient's daughter and advise.

## 2021-02-11 NOTE — Telephone Encounter (Signed)
Pt daughter Regenia Skeeter was notified that the PA for the Cyclobenzaprine has been approved. Dalia verbalized understanding with all questions answered

## 2021-02-23 MED ORDER — PRAVASTATIN SODIUM 40 MG PO TABS: 40.0000 mg | ORAL_TABLET | Freq: Every day | ORAL | 1 refills | Status: DC

## 2021-02-23 NOTE — Telephone Encounter (Signed)
Pt's medication was sent to pt's pharmacy as requested. Confirmation received.  °

## 2021-02-23 NOTE — Telephone Encounter (Signed)
*  STAT* If patient is at the pharmacy, call can be transferred to refill team.   1. Which medications need to be refilled? (please list name of each medication and dose if known) Pravastatin  2. Which pharmacy/location (including street and city if local pharmacy) is medication to be sent to?[facility] [place], [place]  3. Do they need a 30 day or 90 day supply?  Enough until his appointment on 04-06-20 with Denyse Amass

## 2021-03-21 ENCOUNTER — Other Ambulatory Visit: Payer: Self-pay | Admitting: Internal Medicine

## 2021-04-05 NOTE — Progress Notes (Deleted)
Cardiology Office Note:    Date:  04/05/2021   ID:  Joseph [name], DOB 1955/08/28, MRN [id number]  PCP:  Leeanne Rio, MD   [facility] Providers Cardiologist:  Pixie Casino, MD { Click to update primary MD,subspecialty MD or APP then REFRESH:1}    Referring MD: Leeanne Rio, MD   Follow-up for coronary artery disease and hypertension  History of Present Illness:    Joseph [name] is a 66 y.o. male with a hx of diabetes mellitus type 2 insulin-dependent, CKD stage III, HTN, HLD, PVD, OSA (refuses CPAP therapy, and coronary artery disease status post CABG.  He underwent LHC 11/13 which showed proximal-mid LAD lesion and OM 2 disease.  He underwent CABG x5 on 11/13 with LIMA-LAD, SVG-diagonal, SVG-distal OM, and sequential reverse SVG-posterior descending and OM 2.  Had stress testing 3/19 which showed an EF of 63% and low risk.  Echocardiogram 11/19 showed EF 60-65%, G2 DD.  He was seen by Dr. Debara Pickett on 11/20 and denied cardiac symptoms.  However, his systolic blood pressure was in the 190s.  He was seen in follow-up by Almyra Deforest PA-C on 08/04/2019.  During that time he denied shortness of breath and chest discomfort.  He had been seen by his PCP 4/21 which noted systolic blood pressure in the 130s.  During time of his visit his blood pressure had increased to 756E systolic however, he had not yet taken his blood pressure medication.  He was doing well from a cardiac standpoint and follow-up was planned for 6 months.  He presents to clinic today for follow-up evaluation states***  *** denies chest pain, shortness of breath, lower extremity edema, fatigue, palpitations, melena, hematuria, hemoptysis, diaphoresis, weakness, presyncope, syncope, orthopnea, and PND.   Past Medical History:  Diagnosis Date   Anemia    Anxiety    Arthritis    BPH (benign prostatic hypertrophy)    Cataract    Chronic renal insufficiency, stage III (moderate) ([place])    [facility] [facility]    Coronary artery disease    Dr Debara Pickett    Depression    Diabetes mellitus without complication ([place]) 3329   insulin dependent   Diabetic autonomic neuropathy ([place]) 03/19/2019   Gastric polyps - hyperplastic 03/19/2019   Gastroparesis due to DM (HCC)    GERD (gastroesophageal reflux disease)    Hx of adenomatous colonic polyps 06/11/2014   Hyperlipidemia    Hypertension    Myocardial infarction ([place])    Neuropathy    PVD (peripheral vascular disease) ([place])    Sleep apnea 2010   refuses to use cpap    Past Surgical History:  Procedure Laterality Date   ANKLE FRACTURE SURGERY     CARDIAC CATHETERIZATION  01/30/2012   This demonstrated proximal to mid LAD disease and disease of 2 large OM vessels concerning for surgical disease.   COLONOSCOPY     CORONARY ARTERY BYPASS GRAFT  02/06/2012   Procedure: CORONARY ARTERY BYPASS GRAFTING (CABG); 5- Vessel bypass a LIMA to the LAD, a saphenous vein to the to the diagonal , a saphenous vein to the distal OM, and sequential reverse saphenous vien graft to the posterior descending and 2nd OM. Surgeon: Grace Isaac, MD;  Location: [facility];  Service: Open Heart Surgery;  Laterality: [name];   ESOPHAGOGASTRODUODENOSCOPY  2005   LEFT HEART CATHETERIZATION WITH CORONARY ANGIOGRAM [name] 01/30/2012   Procedure: LEFT HEART CATHETERIZATION WITH CORONARY ANGIOGRAM;  Surgeon: Pixie Casino, MD;  Location:  [facility];  Service: Cardiovascular;  Laterality: [name];   UPPER GASTROINTESTINAL ENDOSCOPY      Current Medications: No outpatient medications have been marked as taking for the 04/06/21 encounter (Appointment) with Deberah Pelton, NP.     Allergies:   Metformin and Robaxin [methocarbamol]   Social History   Socioeconomic History   Marital status: Widowed    Spouse name: Not on file   Number of children: 4   Years of education: Not on file   Highest education level: Not on file  Occupational History   Occupation: disabled  Tobacco Use    Smoking status: Never   Smokeless tobacco: Never  Vaping Use   Vaping Use: Never used  Substance and Sexual Activity   Alcohol use: No   Drug use: No   Sexual activity: Not on file  Other Topics Concern   Not on file  Social History Narrative   Widowed, 1 son and 4 daughters   Disabled and dual eligible Medicare and Medicaid   One caffeinated beverage daily   Social Determinants of Health   Financial Resource Strain: Not on file  Food Insecurity: Not on file  Transportation Needs: Not on file  Physical Activity: Not on file  Stress: Not on file  Social Connections: Not on file     Family History: The patient's ***family history includes Cancer in his sister; Diabetes in his father; Hypertension in his father. There is no history of Colon cancer, Esophageal cancer, Rectal cancer, Stomach cancer, or Pancreatic cancer.  ROS:   Please see the history of present illness.    *** All other systems reviewed and are negative.   Risk Assessment/Calculations:   {Does this patient have ATRIAL FIBRILLATION?[phone]}       Physical Exam:    VS:  There were no vitals taken for this visit.    Wt Readings from Last 3 Encounters:  12/23/20 197 lb (89.4 kg)  11/18/20 197 lb (89.4 kg)  11/07/19 211 lb (95.7 kg)     GEN: *** Well nourished, well developed in no acute distress HEENT: Normal NECK: No JVD; No carotid bruits LYMPHATICS: No lymphadenopathy CARDIAC: ***RRR, no murmurs, rubs, gallops RESPIRATORY:  Clear to auscultation without rales, wheezing or rhonchi  ABDOMEN: Soft, non-tender, non-distended MUSCULOSKELETAL:  No edema; No deformity  SKIN: Warm and dry NEUROLOGIC:  Alert and oriented x 3 PSYCHIATRIC:  Normal affect    EKGs/Labs/Other Studies Reviewed:    The following studies were reviewed today: Echocardiogram 02/18/2018  Study Conclusions   - Left ventricle: The cavity size was normal. Wall thickness was    increased in a pattern of mild LVH. Systolic  function was normal.    The estimated ejection fraction was in the range of 60% to 65%.    Features are consistent with a pseudonormal left ventricular    filling pattern, with concomitant abnormal relaxation and    increased filling pressure (grade 2 diastolic dysfunction).   -------------------------------------------------------------------  Study data:  Comparison was made to the study of 09/02/2009.  Study  status:  Routine.  Procedure:  The patient reported no pain pre or  post test. Transthoracic echocardiography. Image quality was  adequate. Intravenous contrast (Definity) was administered.  Study  completion:  There were no complications.          Transthoracic  echocardiography.  M-mode, complete 2D, spectral Doppler, and color  Doppler.  Birthdate:  Patient birthdate: May 15, 1955.  Age:  Patient  is 66 yr old.  Sex:  Gender: male.    BMI: 32.2 kg/m^2.  Blood  pressure:     158/83  Patient status:  Outpatient.  Study date:  Study date: 02/18/2018. Study time: 02:50 PM.  Location:  [facility]   -------------------------------------------------------------------   -------------------------------------------------------------------  Left ventricle:  The cavity size was normal. Wall thickness was  increased in a pattern of mild LVH. Systolic function was normal.  The estimated ejection fraction was in the range of 60% to 65%.  Features are consistent with a pseudonormal left ventricular  filling pattern, with concomitant abnormal relaxation and increased  filling pressure (grade 2 diastolic dysfunction). There was no  evidence of elevated ventricular filling pressure by Doppler  parameters.   -------------------------------------------------------------------  Aortic valve:   Mildly thickened leaflets.  Doppler:  There was no  significant regurgitation.   -------------------------------------------------------------------  Mitral valve:   Structurally normal valve.    Leaflet separation was  normal.  Doppler:  Transvalvular velocity was within the normal  range. There was no evidence for stenosis. There was trivial  regurgitation.    Valve area by pressure half-time: 4.15 cm^2.  Indexed valve area by pressure half-time: 1.95 cm^2/m^2.    Peak  gradient (D): 4 mm Hg.   -------------------------------------------------------------------  Left atrium:  The atrium was normal in size.   -------------------------------------------------------------------  Right ventricle:  The cavity size was normal. Wall thickness was  normal. Systolic function was normal.   -------------------------------------------------------------------  Tricuspid valve:   Structurally normal valve.   Leaflet separation  was normal.  Doppler:  Transvalvular velocity was within the normal  range. There was no regurgitation.   -------------------------------------------------------------------  Right atrium:  The atrium was normal in size.   -------------------------------------------------------------------  Pericardium:  There was no pericardial effusion.   Nuclear stress test 06/05/2017 Nuclear stress EF: 63%. The left ventricular ejection fraction is normal (55-65%). The study is normal. This is a low risk study.  EKG:  EKG is *** ordered today.  The ekg ordered today demonstrates ***  EKG 02/17/2019 Normal sinus rhythm T wave inversion in inferior leads  Recent Labs: No results found for requested labs within last 8760 hours.  Recent Lipid Panel    Component Value Date/Time   CHOL 119 02/14/2019 1050   CHOL 96 (L) 03/04/2014 1050   TRIG 138 02/14/2019 1050   TRIG 106 03/04/2014 1050   HDL 32 (L) 02/14/2019 1050   HDL 37 (L) 03/04/2014 1050   CHOLHDL 3.7 02/14/2019 1050   CHOLHDL 2.5 03/05/2015 1105   VLDL 20 03/05/2015 1105   LDLCALC 63 02/14/2019 1050   LDLCALC 38 03/04/2014 1050    ASSESSMENT & PLAN    Coronary artery disease-no recent episodes of arm  neck back or chest discomfort.  Status post CABG x5 on 02/06/2012.  Stress test 3/19 showed EF 63% and low risk. Continue pravastatin, metoprolol, lisinopril, fenofibrate, aspirin Heart healthy low-sodium diet-salty 6 given Increase physical activity as tolerated  Essential hypertension-BP today***.  Well-controlled on. Continue lisinopril, metoprolol, furosemide Heart healthy low-sodium diet-salty 6 given Increase physical activity as tolerated  Hyperlipidemia-LDL***  Continue pravastatin, fenofibrate Heart healthy low-sodium high-fiber diet Increase physical activity as tolerated Repeat fasting lipids and LFTs  Type 2 diabetes-insulin-dependent. Continue Farxiga, insulin Heart healthy low-sodium carb modified diet Increase physical activity as tolerated Follows with PCP  CKD stage III-no recent lab work.  Follows with PCP Request labs  Disposition: Follow-up with Dr. Debara Pickett in 9-12 months.  {Are you ordering a  CV Procedure (e.g. stress test, cath, DCCV, TEE, etc)?   Press F2        :[id number]}    Medication Adjustments/Labs and Tests Ordered: Current medicines are reviewed at length with the patient today.  Concerns regarding medicines are outlined above.  No orders of the defined types were placed in this encounter.  No orders of the defined types were placed in this encounter.   There are no Patient Instructions on file for this visit.   Signed, Deberah Pelton, NP  04/05/2021 7:24 AM      Notice: This dictation was prepared with Dragon dictation along with smaller phrase technology. Any transcriptional errors that result from this process are unintentional and may not be corrected upon review.  I spent***minutes examining this patient, reviewing medications, and using patient centered shared decision making involving her cardiac care.  Prior to her visit I spent greater than 20 minutes reviewing her past medical history,  medications, and prior cardiac tests.

## 2021-04-06 ENCOUNTER — Ambulatory Visit (HOSPITAL_BASED_OUTPATIENT_CLINIC_OR_DEPARTMENT_OTHER): Payer: Medicare HMO | Admitting: General Practice

## 2021-04-15 ENCOUNTER — Ambulatory Visit: Payer: Medicare HMO

## 2021-04-15 ENCOUNTER — Other Ambulatory Visit: Payer: Self-pay | Admitting: Internal Medicine

## 2021-04-15 ENCOUNTER — Other Ambulatory Visit: Payer: Self-pay

## 2021-04-15 DIAGNOSIS — E108 Type 1 diabetes mellitus with unspecified complications: Secondary | ICD-10-CM

## 2021-04-15 DIAGNOSIS — Z981 Arthrodesis status: Secondary | ICD-10-CM

## 2021-04-15 DIAGNOSIS — M2141 Flat foot [pes planus] (acquired), right foot: Secondary | ICD-10-CM

## 2021-04-16 NOTE — Progress Notes (Signed)
SITUATION Patient Name:  Joseph [name] MRN:   [id number] Reason for Visit: Evaluation for Ankle Foot Orthosis  Patient Report: Chief Complaint:   Pain in right ankle. Existing AFO nonfunctional Nature of Discomfort/Pain:  Ambulatory Location:    right lower extremity Onset & Duration:   Gradual and Present longer than 3 months Course:    gradually worsening Aggravating or Alleviating Factors: AFO joint that is impacting his medial malleolus  OBJECTIVE DATA & MEASUREMENTS Prognosis:    Good Duration of use:   5 years  Diagnosis:   ICD-10-CM   1. Type 1 diabetes mellitus with complication (HCC)  I71.2     2. Pes planus of both feet  M21.41    M21.42     3. S/P ankle arthrodesis  Z98.1      GOALS, NECESSITIES, & JUSTIFICATIONS Recommended Device: [place] AFO Color:    Black Closure:   Velcro  Laterality HCPCS Code Description Justification  right [id number] Plastic orthosis, custom molded from a model of the patient, custom fabricated, includes casting and cast preparation. Necessary to provide triplanar support to the foot/ankle complex  right L2330 Addition to lower extremity, lacer molded to patient model Necessary to ensure secure hold of orthosis to patient's limb  right L2820 Addition to lower extremity orthosis, soft interface for molded plastic below knee section Necessary to relieve pressure on bony prominences    I certify that [name] qualifies for and will benefit from an ankle foot orthosis used during ambulation based on meeting all of the following criteria;   The patient is: - Ambulatory, and - Has weakness or deformity of the foot and ankle, and - Requires stabilization for medical reasons, and - Has the potential to benefit functionally  The patients medical record contains sufficient documentation of the patients medical condition to substantiate the necessity for the type and quantity of the items ordered.  The goals of this therapy: - Improve  Mobility - Improve Lower Extremity Stability - Decrease Pain - Facilitate Soft Tissue Healing  Necessity of Ankle Foot Orthotic molded to patient model: A custom (vs. prefabricated) ankle foot orthosis has been prescribed based on the following criteria which are specific to the condition of this patient; - The patient could not be fit with a prefabricated AFO - The condition necessitating the orthosis is expected to be permanent or of longstanding duration (more than 6 months) - There is need to control the ankle or foot in more than one plane - The patient has a documented neurological, circulatory, or orthopedic condition that requires custom fabrication over a model to prevent tissue injury - The patient has a healing fracture that lacks normal anatomical integrity or anthropometric proportions  I hereby certify that the ankle foot orthotic described above is a rigid or semi-rigid device which is used for the purpose of supporting a weak or deformed body member or restricting or eliminating motion in a diseased or injured part of the body. It is designed to provide support and counterforce on the limb or body part that is being braced. In my opinion, the custom molded ankle foot orthosis is both reasonable and necessary in reference to accepted standards of medical practice in the treatment  of the patient condition and rehabilitation.  ACTIONS PERFORMED Patient was evaluated and casted for Custom AFO via [employer]. Procedure was explained to patient. Patient tolerated procedure. patient selected device color and closure method.   PLAN Patient to return in four to six  weeks for fitting and delivery of device. Plan of care was explained to and agreed upon by patient. All questions were answered and concerns addressed.

## 2021-04-16 NOTE — Progress Notes (Signed)
SITUATION Reason for Consult: Evaluation for Prefabricated Diabetic Shoes and Bilateral Custom Diabetic Inserts. Patient / Caregiver Report: Patient would like well fitting shoes  OBJECTIVE DATA: Patient History / Diagnosis:    ICD-10-CM   1. Type 1 diabetes mellitus with complication (HCC)  P94.7     2. Pes planus of both feet  M21.41    M21.42     3. S/P ankle arthrodesis  Z98.1       Current or Previous Devices:   New Balance attached to nonfunctional double upright AFO  In-Person Foot Examination: Ulcers & Callousing:   None  Toe / Foot Deformities:   - Pes Planus    Shoe Size: 8W  ORTHOTIC RECOMMENDATION Recommended Devices: - 1x pair prefabricated PDAC approved diabetic shoes: [id number] 8W - 3x pair custom-to-patient vacuum formed diabetic insoles.   GOALS OF SHOES AND INSOLES - Reduce shear and pressure - Reduce / Prevent callus formation - Reduce / Prevent ulceration - Protect the fragile healing compromised diabetic foot.  Patient would benefit from diabetic shoes and inserts as patient has diabetes mellitus and the patient has one or more of the following conditions: - Peripheral neuropathy with evidence of callus formation - Foot deformity - Poor circulation  ACTIONS PERFORMED Patient was casted for insoles via crush box and measured for shoes via brannock device. Procedure was explained and patient tolerated procedure well. All questions were answered and concerns addressed.  PLAN Patient is to ensure treating physician receives and completes diabetic paperwork. Casts and shoe order are to be held until paperwork is received. Once received patient is to be scheduled for fitting in four weeks.

## 2021-04-18 ENCOUNTER — Telehealth: Payer: Self-pay | Admitting: Internal Medicine

## 2021-04-18 MED ORDER — METOCLOPRAMIDE HCL 10 MG PO TABS: 10.0000 mg | ORAL_TABLET | Freq: Four times a day (QID) | ORAL | 0 refills | Status: DC | PRN

## 2021-04-18 NOTE — Telephone Encounter (Signed)
Reglan 10 mg every 6 hrs as needed # 60 no refill

## 2021-04-18 NOTE — Telephone Encounter (Signed)
I spoke with his daughter and she said he woke up nauseated and vomited 3 times today, no fever. They are requesting something for nausea. Please advise Sir. I confirmed the [facility] in [place].

## 2021-04-18 NOTE — Telephone Encounter (Signed)
Inbound call from patient daughter requesting if patient can get prescription for nausea

## 2021-04-18 NOTE — Telephone Encounter (Signed)
His daughter was notified that the reglan has been sent in.

## 2021-05-04 ENCOUNTER — Other Ambulatory Visit: Payer: Self-pay | Admitting: Internal Medicine

## 2021-05-04 ENCOUNTER — Telehealth: Payer: Self-pay | Admitting: Podiatry

## 2021-05-04 NOTE — Telephone Encounter (Signed)
Pts daughter left message asking about status of pts diabetic shoes/inserts and brace.  I returned call and left message that the brace takes 4 to 6 weeks and the shoes we just got the needed paperwork last week from the pcp so everything is ordered just waiting on it to come back in.. I told her I would call when everything is in and get them scheduled to pick it up.

## 2021-05-06 ENCOUNTER — Encounter (INDEPENDENT_AMBULATORY_CARE_PROVIDER_SITE_OTHER): Payer: Medicare HMO | Admitting: Ophthalmology

## 2021-05-06 ENCOUNTER — Other Ambulatory Visit: Payer: Self-pay

## 2021-05-06 DIAGNOSIS — E113511 Type 2 diabetes mellitus with proliferative diabetic retinopathy with macular edema, right eye: Secondary | ICD-10-CM

## 2021-05-06 DIAGNOSIS — E113592 Type 2 diabetes mellitus with proliferative diabetic retinopathy without macular edema, left eye: Secondary | ICD-10-CM

## 2021-05-06 DIAGNOSIS — I10 Essential (primary) hypertension: Secondary | ICD-10-CM | POA: Diagnosis not present

## 2021-05-06 DIAGNOSIS — H35033 Hypertensive retinopathy, bilateral: Secondary | ICD-10-CM | POA: Diagnosis not present

## 2021-05-15 ENCOUNTER — Other Ambulatory Visit: Payer: Self-pay | Admitting: Internal Medicine

## 2021-05-15 ENCOUNTER — Other Ambulatory Visit: Payer: Self-pay | Admitting: Physician Assistant

## 2021-05-16 ENCOUNTER — Encounter (INDEPENDENT_AMBULATORY_CARE_PROVIDER_SITE_OTHER): Payer: Medicare HMO | Admitting: Ophthalmology

## 2021-05-19 ENCOUNTER — Ambulatory Visit (INDEPENDENT_AMBULATORY_CARE_PROVIDER_SITE_OTHER): Payer: Medicare HMO

## 2021-05-19 ENCOUNTER — Other Ambulatory Visit: Payer: Self-pay

## 2021-05-19 ENCOUNTER — Telehealth: Payer: Self-pay

## 2021-05-19 DIAGNOSIS — M2141 Flat foot [pes planus] (acquired), right foot: Secondary | ICD-10-CM

## 2021-05-19 DIAGNOSIS — M2142 Flat foot [pes planus] (acquired), left foot: Secondary | ICD-10-CM

## 2021-05-19 DIAGNOSIS — E108 Type 1 diabetes mellitus with unspecified complications: Secondary | ICD-10-CM | POA: Diagnosis not present

## 2021-05-19 NOTE — Progress Notes (Signed)
SITUATION Reason for Visit: Dispensation and Fitting of [place] Patient Report: Patient reports comfort in ambulation and understands all instructions.  OBJECTIVE DATA Patient History / Diagnosis:     ICD-10-CM   1. Type 1 diabetes mellitus with complication (HCC)  U43.8     2. Pes planus of both feet  M21.41    M21.42       Provided Device:  Custom Molded Gauntlet: Style [place] AFO     Custom molded diabetic insoles  Goals of Orthosis: - Improve gait - Decrease energy expenditure during the gait cycle - Improve balance - Stabilize motion at ankle and subtalar joint - Compensate for muscle weakness - Facilitate motion - Provide triplanar ankle and foot stabilization for weight bearing activities  Device Justification: - Patient is ambulatory  - Device is medically necessary as part of the overall treatment due to the patient's condition and related symptoms - It is anticipated that the patient will benefit functionally with use of the device.  - The custom device is utilized in an attempt to avoid the need for surgery and because a prefabricated device is inappropriate.  Upon gait analysis, the device appeared to be fitting well and the patient states that the device is comfortable.  ACTIONS PERFORMED Patient was fit with [employer] and insoles. Patient tolerated fitting procedure. Fit of the device is good. Patient was able to apply properly and ambulate without distress. Device function is to restrict and limit motion and provide stabilization in the ankle joint.   Goals and function of this device were explained in detail to the patient. The patient was shown how to properly apply, wear, and care for the device. It was explained that the device will fit and function best in an adjustable-closure shoe with a firm heel counter and a wide base of support. When the device was dispensed, it was suitable for the patient's condition and not  substandard. No guarantees were given. Precautions were reviewed.   Written instructions, warranty information, and a copy of DMEPOS Supplier Standards were provided. All questions answered and concerns addressed.  Shoes ordered were the wrong size, will return and re-order in size 10W. Patient is satisfied and will return for shoes when ready.  PLAN Patient is to follow up in one week or as necessary (PRN). Plan of care was discussed with and agreed upon by patient.

## 2021-05-19 NOTE — Telephone Encounter (Signed)
Generated RA for Apex Men - Boss Runner Blue - X521M size 8W and ordered size 10W

## 2021-05-23 ENCOUNTER — Other Ambulatory Visit: Payer: Self-pay

## 2021-05-23 ENCOUNTER — Ambulatory Visit (INDEPENDENT_AMBULATORY_CARE_PROVIDER_SITE_OTHER): Payer: Medicare HMO | Admitting: Internal Medicine

## 2021-05-23 ENCOUNTER — Encounter: Payer: Self-pay | Admitting: Internal Medicine

## 2021-05-23 VITALS — BP 166/94 | Ht 66.0 in | Wt 202.4 lb

## 2021-05-23 DIAGNOSIS — I10 Essential (primary) hypertension: Secondary | ICD-10-CM | POA: Diagnosis not present

## 2021-05-23 DIAGNOSIS — E785 Hyperlipidemia, unspecified: Secondary | ICD-10-CM

## 2021-05-23 DIAGNOSIS — E119 Type 2 diabetes mellitus without complications: Secondary | ICD-10-CM

## 2021-05-23 DIAGNOSIS — R0602 Shortness of breath: Secondary | ICD-10-CM | POA: Diagnosis not present

## 2021-05-23 DIAGNOSIS — Z794 Long term (current) use of insulin: Secondary | ICD-10-CM

## 2021-05-23 DIAGNOSIS — I25810 Atherosclerosis of coronary artery bypass graft(s) without angina pectoris: Secondary | ICD-10-CM

## 2021-05-23 NOTE — Patient Instructions (Signed)
Medication Instructions:  Your physician recommends that you continue on your current medications as directed. Please refer to the Current Medication list given to you today.   Labwork: Fasting Lipids to be done at [facility] lab  Testing/Procedures: Your physician has requested that you have en exercise stress myoview. For further information please visit [email or web address]. Please follow instruction sheet, as given.   Follow-Up: 1 year  Any Other Special Instructions Will Be Listed Below (If Applicable).  If you need a refill on your cardiac medications before your next appointment, please call your pharmacy.

## 2021-05-23 NOTE — Progress Notes (Signed)
OFFICE NOTE  Chief Complaint:  Follow-up, dyspnea  Primary Care Physician: Leeanne Rio, MD  HPI:  Joseph [name] is a 66 year old gentleman who recently underwent a MET test with a low VO2. I was concerned about multi-vessel coronary disease and then performed cardiac catheterization on him on January 30, 2012. This demonstrated proximal to mid LAD disease and disease of 2 large OM vessels concerning for surgical disease. I talked with Servando Snare and he recommended bypass surgery. He did undergo bypass surgery on February 06, 2012 with a 5-vessel bypass, a LIMA to the LAD, a saphenous vein to the diagonal, a saphenous vein to the distal OM, and sequential reverse saphenous vein graft to the posterior descending and 2nd OM. He did well with this, although it was complicated with some postoperative nausea. He has had some shortness of breath, weakness and slow recovery. He has also had some tenderness in the chest wall around the area of the sternum and incision which he was told by Dr. Servando Snare could take some time to improve. We have also made some adjustments in his medicines. He is undergoing home occupational therapy by [facility] and they are recommending more upper extremity work which I agree with.  Today he continues to complain about tightness across the chest wall. The midline incision does demonstrate a keloid scar, and this may be responsible for his feeling of tightness. It potentially is possible that the sternum was too closely approximated, however Dr. Servando Snare did not indicate the event this was a problem. Overall Joseph [name] is doing well without complaints of shortness of breath, chest pain, palpitations, or syncope. Does report some mild lower stray swelling, but his weight has generally been the same.  He is followed by Dr. Moshe Cipro for chronic kidney disease. Finally he reports has been having some problems with what sounds like tinea cruris.  He is currently taking a topical  steroid and prednisone.  Joseph [name] returns today and is without complaints. Denies shortness of breath or chest pain. He reports his kidneys have been stable. He has managed to lose a small amount of weight. He says that his diabetes is well controlled. He has not had a cholesterol test in over a year.  Joseph [name] returns for follow-up today. He reports he is feeling quite well. He denies any chest pain or shortness of breath. He is noted to sleep a lot by his significant other. He says that since he is disabled he is not as active as he once was. He reported to me that his primary care provider has stopped seeing patients enclosed the clinic. He currently is switching to a new primary care provider. His diabetes is followed by Dr. Chalmers Cater in his A1c is in the range of 5-6.  03/09/2016  Joseph [name] was seen again today in follow-up. Overall he is again without complaints. Blood pressure is well-controlled. Weight is fairly stable, in fact actually down 8 pounds. He reports pretty good diabetes control. His cholesterol is at goal. He denies any recurrent chest pain. He continues to feel tightness in his chest wall which I think is related to scar tissue, particularly a keloid scar at the chest wall incision. EKG shows sinus bradycardia 54 with moderate voltage criteria for LVH.  05/31/2017  Joseph [name] returns today for follow-up.  He is done well over the past year and a half.  He denies any chest pain or worsening shortness of breath but is not very physically active.  Weight  has been fairly stable but could be improved.  Blood pressure is at goal today.  He is followed by Dr. Horald Pollen for primary care.  We recently received some lab work which was completed in September 2017.  His LDL at that time was 51 hemoglobin A1c of 6.7.  He is coronary artery bypass grafting was in 2013 and he is not had an ischemia evaluation since that time.  02/13/2018  Joseph [name] is seen today for chest pain.  This  happened a few weeks ago and he described left central chest pain which radiated to the left arm.  The episode came on rather abruptly and was more intense for 30 minutes to an hour and then gently subsided over the next 24 hours.  Since then he has had no more symptoms.  He was not seen in the ER for this.  He did not have nitroglycerin to take although saw his primary care provider recently who gave him some.  Since then he has had no further chest pain.  Blood pressure is elevated today.  EKG shows sinus bradycardia 56 with moderate voltage criteria for LVH.  EKG performed by his primary apparently did not show any ischemia.  02/13/2019  Joseph [name] returns today for follow-up.  I saw him about 6 months ago via virtual visit.  Blood pressure was well controlled.  Recently saw his GI doctor again with good blood pressure control however today in the office it was 194/73.  He says he might have been anxious and did report taking his medicines today.  EKG shows a sinus bradycardia.  He is asymptomatic he denies any chest pain or worsening shortness of breath.  He had labs through his PCP which indicates good control over his diabetes with hemoglobin A1c of 5.7.  He is managed to lose some more weight.  His cholesterol has been at target.  05/23/2021  Joseph [name] is seen today in follow-up.  Recently has had some more shortness of breath.  His daughter noted that when taking out his trash or going up a hill he gets more short of breath.  He has had some weight gain now up about 5 to 7 pounds since August 2022.  He is attributed to that.  He could certainly be his bypass graft.  He is now 10 years out from CABG.  Blood pressure was a little elevated today.  He reports better control at home and just took his medicine about 30 minutes ago.  He has not had recent lipids.  He is overdue for follow-up with his PCP.  He reports good glycemic control.  PMHx:  Past Medical History:  Diagnosis Date   Anemia     Anxiety    Arthritis    BPH (benign prostatic hypertrophy)    Cataract    Chronic renal insufficiency, stage III (moderate) ([place])    [facility]   Coronary artery disease    Dr Debara Pickett    Depression    Diabetes mellitus without complication ([place]) 2409   insulin dependent   Diabetic autonomic neuropathy ([place]) 03/19/2019   Gastric polyps - hyperplastic 03/19/2019   Gastroparesis due to DM (HCC)    GERD (gastroesophageal reflux disease)    Hx of adenomatous colonic polyps 06/11/2014   Hyperlipidemia    Hypertension    Myocardial infarction ([place])    Neuropathy    PVD (peripheral vascular disease) ([place])    Sleep apnea 2010   refuses to use cpap  Past Surgical History:  Procedure Laterality Date   ANKLE FRACTURE SURGERY     CARDIAC CATHETERIZATION  01/30/2012   This demonstrated proximal to mid LAD disease and disease of 2 large OM vessels concerning for surgical disease.   COLONOSCOPY     CORONARY ARTERY BYPASS GRAFT  02/06/2012   Procedure: CORONARY ARTERY BYPASS GRAFTING (CABG); 5- Vessel bypass a LIMA to the LAD, a saphenous vein to the to the diagonal , a saphenous vein to the distal OM, and sequential reverse saphenous vien graft to the posterior descending and 2nd OM. Surgeon: Grace Isaac, MD;  Location: [facility];  Service: Open Heart Surgery;  Laterality: [name];   ESOPHAGOGASTRODUODENOSCOPY  2005   LEFT HEART CATHETERIZATION WITH CORONARY ANGIOGRAM [name] 01/30/2012   Procedure: LEFT HEART CATHETERIZATION WITH CORONARY ANGIOGRAM;  Surgeon: Pixie Casino, MD;  Location: [facility] CATH LAB;  Service: Cardiovascular;  Laterality: [name];   UPPER GASTROINTESTINAL ENDOSCOPY      FAMHx:  Family History  Problem Relation Age of Onset   Diabetes Father    Hypertension Father    Cancer Sister        unknown   Colon cancer Neg Hx    Esophageal cancer Neg Hx    Rectal cancer Neg Hx    Stomach cancer Neg Hx    Pancreatic cancer Neg Hx     SOCHx:   reports that he has never  smoked. He has never used smokeless tobacco. He reports that he does not drink alcohol and does not use drugs.  ALLERGIES:  Allergies  Allergen Reactions   Metformin Diarrhea    diarrhea diarrhea    Robaxin [Methocarbamol] Itching and Rash    ROS: Pertinent items noted in HPI and remainder of comprehensive ROS otherwise negative.  HOME MEDS: Current Outpatient Medications  Medication Sig Dispense Refill   aspirin 81 MG chewable tablet Chew by mouth.     B-D UF III MINI PEN NEEDLES 31G X 5 MM MISC Inject into the skin 3 (three) times daily.     cholecalciferol (VITAMIN D) 1000 UNITS tablet Take 1,000 Units by mouth daily.     cyclobenzaprine (FLEXERIL) 10 MG tablet TAKE 1 TABLET BY MOUTH THREE TIMES A DAY AS NEEDED FOR MUSCLE SPASMS 60 tablet 5   dicyclomine (BENTYL) 10 MG capsule TAKE 1 CAPSULE BY MOUTH 2 TIMES DAILY. 180 capsule 1   DULoxetine (CYMBALTA) 30 MG capsule Take by mouth.     ergocalciferol (VITAMIN D2) 1.25 MG (50000 UT) capsule TAKE ONE CAPSULY BY MOUTH ONCE A WEEK     famotidine (PEPCID) 40 MG tablet TAKE 1 TABLET BY MOUTH EVERYDAY AT BEDTIME 90 tablet 3   FARXIGA 10 MG TABS tablet Take 10 mg by mouth daily.     fenofibrate 160 MG tablet Take 1 tablet by mouth daily.     ferrous sulfate 325 (65 FE) MG tablet Take 325 mg by mouth 2 (two) times daily.     furosemide (LASIX) 20 MG tablet TAKE 1 TABLET (20 MG TOTAL) BY MOUTH DAILY. PLEASE CONTACT OFFICE FOR APPOINTMENT NEED OV. 30 tablet 0   glucose blood (ONETOUCH VERIO) test strip CHECK SUGARS 4 TIMES A DAY DX E11.9     Insulin Degludec (TRESIBA FLEXTOUCH [place]) Inject 40 Units into the skin daily.     Insulin Pen Needle (B-D UF III MINI PEN NEEDLES) 31G X 5 MM MISC CHECK THREE TIMES DAILY DX E11.9     Insulin Pen Needle (B-D UF III  MINI PEN NEEDLES) 31G X 5 MM MISC CHECK THREE TIMES DAILY DX E11.9     Lancets (ONETOUCH DELICA PLUS [id number]) MISC Apply topically.     levocetirizine (XYZAL) 5 MG tablet SMARTSIG:1  Tablet(s) By Mouth Every Evening     lisinopril (PRINIVIL,ZESTRIL) 20 MG tablet TAKE 1 TABLET (20 MG TOTAL) BY MOUTH DAILY. 90 tablet 3   metoCLOPramide (REGLAN) 10 MG tablet TAKE 1 TABLET BY MOUTH EVERY 6 HOURS AS NEEDED FOR NAUSEA. 60 tablet 0   metoprolol tartrate (LOPRESSOR) 25 MG tablet TAKE 1 TABLET (25 MG TOTAL) BY MOUTH 2 (TWO) TIMES DAILY. 60 tablet 9   montelukast (SINGULAIR) 10 MG tablet Take 1 tablet by mouth daily.     nitroGLYCERIN (NITROSTAT) 0.4 MG SL tablet Place 1 tablet (0.4 mg total) under the tongue every 5 (five) minutes as needed for chest pain. 20 tablet 3   NOVOLOG FLEXPEN 100 UNIT/ML FlexPen Inject 17-20 Units into the skin 3 (three) times daily.     omeprazole (PRILOSEC) 40 MG capsule Take 40 mg by mouth daily.     ONETOUCH VERIO test strip 4 (four) times daily.     pravastatin (PRAVACHOL) 40 MG tablet TAKE 1 TABLET BY MOUTH DAILY. PLEASE KEEP UPCOMING APPT IN JANUARY 2023 90 tablet 1   No current facility-administered medications for this visit.    LABS/IMAGING: No results found for this or any previous visit (from the past 48 hour(s)). No results found.  VITALS: BP (!) 166/94    Ht $R'5\' 6"'cE$  (1.676 m)    Wt 202 lb 6.4 oz (91.8 kg)    SpO2 98%    BMI 32.67 kg/m   EXAM: General appearance: alert and no distress Neck: no adenopathy, no carotid bruit, no JVD, supple, symmetrical, trachea midline and thyroid not enlarged, symmetric, no tenderness/mass/nodules Lungs: clear to auscultation bilaterally Heart: regular rate and rhythm, S1, S2 normal, no murmur, click, rub or gallop Abdomen: soft, non-tender; bowel sounds normal; no masses,  no organomegaly Extremities: extremities normal, atraumatic, no cyanosis or edema Pulses: 2+ and symmetric Skin: Hypertrophic scar of the midline sternotomy incision Neurologic: Grossly normal  EKG: Normal sinus rhythm at 60, minimal voltage criteria for LVH-personally reviewed  ASSESSMENT: Dyspnea on exertion Coronary disease  status post 5 vessel bypass in November 2013, with LIMA to LAD, SVG to diagonal, SVG to OM, SVG to posterior descending and SVG to the second OM Insulin-dependent diabetes Dyslipidemia Hypertension Obesity Chronic kidney disease stage II  PLAN: 1.  Joseph [name] has been having some dyspnea on exertion.  He is also had about 10 pound weight gain.  This may be the rationale for that.  He exercises a couple times a week only for about 30 minutes and sounds like fairly flat.  I would like to go ahead and get an exercise Myoview test on him.  If this is low risk, then would recommend an increase in his exercise to 45 minutes to an hour 3-5 times a week.  His daughter also was discussing the possibility of joining the [facility] for water aerobics.  I will contact him with the results of his stress test and will also get a lipid profile make adjustments as necessary but plan follow-up annually or sooner unless his Myoview is negative  Pixie Casino, MD, [facility], [place] Director of the Advanced Lipid Disorders &  Cardiovascular Risk Reduction Clinic Diplomate of the American Board of Clinical Lipidology Attending Cardiologist  Direct Dial: [phone]   Fax: [phone]  Website:  [email or web address] 05/23/2021, 8:55 AM

## 2021-06-02 ENCOUNTER — Other Ambulatory Visit (HOSPITAL_COMMUNITY)
Admission: RE | Admit: 2021-06-02 | Discharge: 2021-06-02 | Disposition: A | Discharge status: Home / Self Care | Payer: Medicare HMO | Source: Ambulatory Visit | Attending: Internal Medicine | Admitting: Internal Medicine

## 2021-06-02 DIAGNOSIS — E785 Hyperlipidemia, unspecified: Secondary | ICD-10-CM | POA: Insufficient documentation

## 2021-06-02 LAB — LIPID PANEL
Cholesterol: 102 mg/dL (ref 0–200)
HDL: 31 mg/dL — ABNORMAL LOW (ref >40)
LDL Cholesterol: 49 mg/dL (ref 0–99)
Total CHOL/HDL Ratio: 3.3 RATIO
Triglycerides: 109 mg/dL (ref <150)
VLDL: 22 mg/dL (ref 0–40)

## 2021-06-03 ENCOUNTER — Encounter (HOSPITAL_COMMUNITY): Payer: Self-pay

## 2021-06-03 ENCOUNTER — Other Ambulatory Visit: Payer: Self-pay

## 2021-06-03 ENCOUNTER — Encounter (HOSPITAL_COMMUNITY)
Admission: RE | Admit: 2021-06-03 | Discharge: 2021-06-03 | Disposition: A | Discharge status: Home / Self Care | Payer: Medicare HMO | Source: Ambulatory Visit | Attending: Internal Medicine | Admitting: Internal Medicine

## 2021-06-03 ENCOUNTER — Ambulatory Visit (HOSPITAL_COMMUNITY)
Admission: RE | Admit: 2021-06-03 | Discharge: 2021-06-03 | Disposition: A | Discharge status: Home / Self Care | Payer: Medicare HMO | Source: Ambulatory Visit | Attending: Internal Medicine | Admitting: Internal Medicine

## 2021-06-03 DIAGNOSIS — R0602 Shortness of breath: Secondary | ICD-10-CM | POA: Diagnosis present

## 2021-06-03 LAB — NM MYOCAR MULTI W/SPECT W/WALL MOTION / EF
Angina Index: 0
Duke Treadmill Score: 5
Exercise duration (min): 4 min
Exercise duration (sec): 38 s
LV dias vol: 90 mL (ref 62–150)
LV sys vol: 31 mL
MPHR: 154 {beats}/min
Nuc Stress EF: 66 %
Peak HR: 110 {beats}/min
Percent HR: 71 %
RATE: 0.4
RPE: 15
Rest HR: 57 {beats}/min
Rest Nuclear Isotope Dose: 10.7 mCi
SDS: 0
SRS: 0
SSS: 0
ST Depression (mm): 0 mm
Stress Nuclear Isotope Dose: 31 mCi
TID: 1.01

## 2021-06-03 MED ORDER — SODIUM CHLORIDE FLUSH 0.9 % IV SOLN
INTRAVENOUS | Status: AC
  Administered 2021-06-03: 10 mL via INTRAVENOUS
  Filled 2021-06-03: qty 10

## 2021-06-03 MED ORDER — TECHNETIUM TC 99M TETROFOSMIN IV KIT
30.0000 | PACK | Freq: Once | INTRAVENOUS | Status: AC | PRN
Start: 2021-06-03 — End: 2021-06-03
  Administered 2021-06-03: 31 via INTRAVENOUS

## 2021-06-03 MED ORDER — TECHNETIUM TC 99M TETROFOSMIN IV KIT
10.0000 | PACK | Freq: Once | INTRAVENOUS | Status: AC | PRN
  Administered 2021-06-03: 10.7 via INTRAVENOUS

## 2021-06-03 MED ORDER — REGADENOSON 0.4 MG/5ML IV SOLN
INTRAVENOUS | Status: AC
  Administered 2021-06-03: 0.4 mg via INTRAVENOUS
  Filled 2021-06-03: qty 5

## 2021-06-06 ENCOUNTER — Ambulatory Visit (INDEPENDENT_AMBULATORY_CARE_PROVIDER_SITE_OTHER): Payer: Medicare HMO

## 2021-06-06 ENCOUNTER — Other Ambulatory Visit: Payer: Self-pay

## 2021-06-06 DIAGNOSIS — M2141 Flat foot [pes planus] (acquired), right foot: Secondary | ICD-10-CM

## 2021-06-06 DIAGNOSIS — E108 Type 1 diabetes mellitus with unspecified complications: Secondary | ICD-10-CM | POA: Diagnosis not present

## 2021-06-06 DIAGNOSIS — Z981 Arthrodesis status: Secondary | ICD-10-CM

## 2021-06-06 NOTE — Progress Notes (Signed)
SITUATION ?Reason for Visit: Fitting of Diabetic [place] ?Patient / Caregiver Report:  Patient is satisfied with fit and function of shoes and insoles. ? ?OBJECTIVE DATA: ?Patient History / Diagnosis:   ?  ICD-10-CM   ?1. Type 1 diabetes mellitus with complication (HCC)  V95.6   ?  ?2. Pes planus of both feet  M21.41   ? M21.42   ?  ?3. S/P ankle arthrodesis  Z98.1   ?  ? ? ?Change in Status:   None ? ?ACTIONS PERFORMED: ?In-Person Delivery, patient was fit with: ?- 1x pair A5500 PDAC approved prefabricated Diabetic Shoes: [employer]pex Sneakers 10W ? ?Shoes and insoles were verified for structural integrity and safety. Patient wore shoes and insoles in office. Skin was inspected and free of areas of concern after wearing shoes and inserts. Shoes and inserts fit properly. Patient / Caregiver provided with ferbal instruction and demonstration regarding donning, doffing, wear, care, proper fit, function, purpose, cleaning, and use of shoes and insoles ' and in all related precautions and risks and benefits regarding shoes and insoles. Patient / Caregiver was instructed to wear properly fitting socks with shoes at all times. Patient was also provided with verbal instruction regarding how to report any failures or malfunctions of shoes or inserts, and necessary follow up care. Patient / Caregiver was also instructed to contact physician regarding change in status that may affect function of shoes and inserts.  ? ?Patient / Caregiver verbalized undersatnding of instruction provided. Patient / Caregiver demonstrated independence with proper donning and doffing of shoes and inserts. ? ?PLAN ?Patient to follow up as needed. Plan of care was discussed with and agreed upon by patient and/or caregiver. All questions were answered and concerns addressed. ? ?

## 2021-06-15 ENCOUNTER — Other Ambulatory Visit: Payer: Self-pay | Admitting: Internal Medicine

## 2021-06-20 ENCOUNTER — Other Ambulatory Visit: Payer: Self-pay | Admitting: Internal Medicine

## 2021-06-30 ENCOUNTER — Ambulatory Visit (INDEPENDENT_AMBULATORY_CARE_PROVIDER_SITE_OTHER): Payer: Medicare HMO | Admitting: Podiatry

## 2021-06-30 DIAGNOSIS — M21079 Valgus deformity, not elsewhere classified, unspecified ankle: Secondary | ICD-10-CM | POA: Diagnosis not present

## 2021-06-30 DIAGNOSIS — S90211A Contusion of right great toe with damage to nail, initial encounter: Secondary | ICD-10-CM | POA: Diagnosis not present

## 2021-07-03 NOTE — Progress Notes (Signed)
?  Subjective:  ?Patient ID: Joseph [name], male    DOB: [birth year],  MRN: [id number] ? ?Chief Complaint  ?Patient presents with  ? Diabetes  ?   Diabetic great toe is turning black  R  ? ? ?66 y.o. male presents with the above complaint. History confirmed with patient.  Presents for follow-up today they noticed that there was dark discoloration under the right great toenail.  Thinks maybe that the brace has caused pressure in the shoe that is too thick ? ?Objective:  ?Physical Exam: ?warm, good capillary refill, no trophic changes or ulcerative lesions, normal DP and PT pulses, normal monofilament exam, and normal sensory exam.  Valgus deformity of ankle there is a subungual exostosis that is well organized no active bleeding ? ?Assessment:  ? ?1. Acquired valgus deformity of ankle   ?2. Subungual hematoma of great toe of right foot, initial encounter   ? ? ? ? ?Plan:  ?Patient was evaluated and treated and all questions answered. ? ?Appears that the brace is causing too much pressure in his shoes and causing a subungual hematoma to form slowly.  The nail is well attached and did not require avulsion today.  Discussed with him that if this worsens or develops infection or becomes loose they should return to see me for temporary avulsion if necessary.  Also has been evaluated by our orthotist today for possible adjustment of the brace to see if this can off load pressure here ? ?No follow-ups on file.  ? ? ?

## 2021-07-13 ENCOUNTER — Other Ambulatory Visit: Payer: Self-pay | Admitting: Internal Medicine

## 2021-07-19 ENCOUNTER — Other Ambulatory Visit: Payer: Self-pay | Admitting: Internal Medicine

## 2021-08-03 ENCOUNTER — Encounter: Payer: Self-pay | Admitting: Emergency Medicine

## 2021-08-03 ENCOUNTER — Ambulatory Visit (INDEPENDENT_AMBULATORY_CARE_PROVIDER_SITE_OTHER): Payer: Medicare HMO | Admitting: Emergency Medicine

## 2021-08-03 VITALS — BP 138/82 | HR 60 | Temp 98.3°F | Ht 66.0 in | Wt 202.2 lb

## 2021-08-03 DIAGNOSIS — N182 Chronic kidney disease, stage 2 (mild): Secondary | ICD-10-CM

## 2021-08-03 DIAGNOSIS — I2510 Atherosclerotic heart disease of native coronary artery without angina pectoris: Secondary | ICD-10-CM

## 2021-08-03 DIAGNOSIS — E108 Type 1 diabetes mellitus with unspecified complications: Secondary | ICD-10-CM

## 2021-08-03 DIAGNOSIS — I152 Hypertension secondary to endocrine disorders: Secondary | ICD-10-CM | POA: Diagnosis not present

## 2021-08-03 DIAGNOSIS — E785 Hyperlipidemia, unspecified: Secondary | ICD-10-CM | POA: Diagnosis not present

## 2021-08-03 DIAGNOSIS — E1159 Type 2 diabetes mellitus with other circulatory complications: Secondary | ICD-10-CM

## 2021-08-03 DIAGNOSIS — E1169 Type 2 diabetes mellitus with other specified complication: Secondary | ICD-10-CM

## 2021-08-03 DIAGNOSIS — Z7689 Persons encountering health services in other specified circumstances: Secondary | ICD-10-CM

## 2021-08-03 DIAGNOSIS — I10 Essential (primary) hypertension: Secondary | ICD-10-CM

## 2021-08-03 DIAGNOSIS — K3184 Gastroparesis: Secondary | ICD-10-CM

## 2021-08-03 DIAGNOSIS — I739 Peripheral vascular disease, unspecified: Secondary | ICD-10-CM

## 2021-08-03 DIAGNOSIS — E1143 Type 2 diabetes mellitus with diabetic autonomic (poly)neuropathy: Secondary | ICD-10-CM

## 2021-08-03 LAB — COMPREHENSIVE METABOLIC PANEL
ALT: 18 U/L (ref 0–53)
AST: 23 U/L (ref 0–37)
Albumin: 4.3 g/dL (ref 3.5–5.2)
Alkaline Phosphatase: 29 U/L — ABNORMAL LOW (ref 39–117)
BUN: 27 mg/dL — ABNORMAL HIGH (ref 6–23)
CO2: 28 mEq/L (ref 19–32)
Calcium: 9.4 mg/dL (ref 8.4–10.5)
Chloride: 100 mEq/L (ref 96–112)
Creatinine, Ser: 1.8 mg/dL — ABNORMAL HIGH (ref 0.40–1.50)
GFR: 38.78 mL/min — ABNORMAL LOW (ref >60.00)
Glucose, Bld: 124 mg/dL — ABNORMAL HIGH (ref 70–99)
Potassium: 4.2 mEq/L (ref 3.5–5.1)
Sodium: 135 mEq/L (ref 135–145)
Total Bilirubin: 0.6 mg/dL (ref 0.2–1.2)
Total Protein: 7.4 g/dL (ref 6.0–8.3)

## 2021-08-03 LAB — CBC WITH DIFFERENTIAL/PLATELET
Basophils Absolute: 0 10*3/uL (ref 0.0–0.1)
Basophils Relative: 0.5 % (ref 0.0–3.0)
Eosinophils Absolute: 0.2 10*3/uL (ref 0.0–0.7)
Eosinophils Relative: 2.2 % (ref 0.0–5.0)
HCT: 39.4 % (ref 39.0–52.0)
Hemoglobin: 13 g/dL (ref 13.0–17.0)
Lymphocytes Relative: 33.8 % (ref 12.0–46.0)
Lymphs Abs: 2.5 10*3/uL (ref 0.7–4.0)
MCHC: 33.1 g/dL (ref 30.0–36.0)
MCV: 86.4 fl (ref 78.0–100.0)
Monocytes Absolute: 0.7 10*3/uL (ref 0.1–1.0)
Monocytes Relative: 9.8 % (ref 3.0–12.0)
Neutro Abs: 3.9 10*3/uL (ref 1.4–7.7)
Neutrophils Relative %: 53.7 % (ref 43.0–77.0)
Platelets: 265 10*3/uL (ref 150.0–400.0)
RBC: 4.56 Mil/uL (ref 4.22–5.81)
RDW: 13.5 % (ref 11.5–15.5)
WBC: 7.3 10*3/uL (ref 4.0–10.5)

## 2021-08-03 LAB — POCT GLYCOSYLATED HEMOGLOBIN (HGB A1C): Hemoglobin A1C: 6.8 % — AB (ref 4.0–5.6)

## 2021-08-03 LAB — LIPID PANEL
Cholesterol: 98 mg/dL (ref 0–200)
HDL: 33.1 mg/dL — ABNORMAL LOW (ref >39.00)
LDL Cholesterol: 46 mg/dL (ref 0–99)
NonHDL: 65.03
Total CHOL/HDL Ratio: 3
Triglycerides: 95 mg/dL (ref 0.0–149.0)
VLDL: 19 mg/dL (ref 0.0–40.0)

## 2021-08-03 LAB — HEMOGLOBIN A1C: Hgb A1c MFr Bld: 6.8 % — ABNORMAL HIGH (ref 4.6–6.5)

## 2021-08-03 NOTE — Assessment & Plan Note (Addendum)
Well-controlled hypertension. ?BP Readings from Last 3 Encounters:  ?08/03/21 138/82  ?05/23/21 (!) 166/94  ?12/23/20 (!) 145/77  ?Continue lisinopril 20 mg daily and metoprolol tartrate 2 25 mg twice a day. ?Also takes Lasix 20 mg daily. ?Well-controlled diabetes on insulin and Farxiga.  Hemoglobin A1c done today. ?Sees endocrinologist on a regular basis. ?On Tresiba 40 units daily and Farxiga 10 mg daily. ? ?

## 2021-08-03 NOTE — Assessment & Plan Note (Signed)
Stable.  Diet and nutrition discussed.  Cardiovascular risks associated with diabetes, hypertension, dyslipidemia discussed with patient. ?Continue pravastatin 40 mg and fenofibrate 160 mg daily. ?Sees cardiologist on a regular basis. ?

## 2021-08-03 NOTE — Assessment & Plan Note (Signed)
Stable.  Takes Reglan 10 mg as needed. ?

## 2021-08-03 NOTE — Patient Instructions (Signed)
Health Maintenance After Age 65 After age 66, you are at a higher risk for certain long-term diseases and infections as well as injuries from falls. Falls are a major cause of broken bones and head injuries in people who are older than age 66. Getting regular preventive care can help to keep you healthy and well. Preventive care includes getting regular testing and making lifestyle changes as recommended by your health care provider. Talk with your health care provider about: Which screenings and tests you should have. A screening is a test that checks for a disease when you have no symptoms. A diet and exercise plan that is right for you. What should I know about screenings and tests to prevent falls? Screening and testing are the best ways to find a health problem early. Early diagnosis and treatment give you the best chance of managing medical conditions that are common after age 66. Certain conditions and lifestyle choices may make you more likely to have a fall. Your health care provider may recommend: Regular vision checks. Poor vision and conditions such as cataracts can make you more likely to have a fall. If you wear glasses, make sure to get your prescription updated if your vision changes. Medicine review. Work with your health care provider to regularly review all of the medicines you are taking, including over-the-counter medicines. Ask your health care provider about any side effects that may make you more likely to have a fall. Tell your health care provider if any medicines that you take make you feel dizzy or sleepy. Strength and balance checks. Your health care provider may recommend certain tests to check your strength and balance while standing, walking, or changing positions. Foot health exam. Foot pain and numbness, as well as not wearing proper footwear, can make you more likely to have a fall. Screenings, including: Osteoporosis screening. Osteoporosis is a condition that causes  the bones to get weaker and break more easily. Blood pressure screening. Blood pressure changes and medicines to control blood pressure can make you feel dizzy. Depression screening. You may be more likely to have a fall if you have a fear of falling, feel depressed, or feel unable to do activities that you used to do. Alcohol use screening. Using too much alcohol can affect your balance and may make you more likely to have a fall. Follow these instructions at home: Lifestyle Do not drink alcohol if: Your health care provider tells you not to drink. If you drink alcohol: Limit how much you have to: 0-1 drink a day for women. 0-2 drinks a day for men. Know how much alcohol is in your drink. In the [place] one drink equals one 12 oz bottle of beer (355 mL), one 5 oz glass of wine (148 mL), or one 1 oz glass of hard liquor (44 mL). Do not use any products that contain nicotine or tobacco. These products include cigarettes, chewing tobacco, and vaping devices, such as e-cigarettes. If you need help quitting, ask your health care provider. Activity  Follow a regular exercise program to stay fit. This will help you maintain your balance. Ask your health care provider what types of exercise are appropriate for you. If you need a cane or walker, use it as recommended by your health care provider. Wear supportive shoes that have nonskid soles. Safety  Remove any tripping hazards, such as rugs, cords, and clutter. Install safety equipment such as grab bars in bathrooms and safety rails on stairs. Keep rooms and walkways   well-lit. General instructions Talk with your health care provider about your risks for falling. Tell your health care provider if: You fall. Be sure to tell your health care provider about all falls, even ones that seem minor. You feel dizzy, tiredness (fatigue), or off-balance. Take over-the-counter and prescription medicines only as told by your health care provider. These include  supplements. Eat a healthy diet and maintain a healthy weight. A healthy diet includes low-fat dairy products, low-fat (lean) meats, and fiber from whole grains, beans, and lots of fruits and vegetables. Stay current with your vaccines. Schedule regular health, dental, and eye exams. Summary Having a healthy lifestyle and getting preventive care can help to protect your health and wellness after age 66. Screening and testing are the best way to find a health problem early and help you avoid having a fall. Early diagnosis and treatment give you the best chance for managing medical conditions that are more common for people who are older than age 66. Falls are a major cause of broken bones and head injuries in people who are older than age 66. Take precautions to prevent a fall at home. Work with your health care provider to learn what changes you can make to improve your health and wellness and to prevent falls. This information is not intended to replace advice given to you by your health care provider. Make sure you discuss any questions you have with your health care provider. Document Revised: 08/02/2020 Document Reviewed: 08/02/2020 Elsevier Patient Education  2023 Elsevier Inc.  

## 2021-08-03 NOTE — Progress Notes (Addendum)
Joseph [name] 66 y.o.   Chief Complaint  Patient presents with   New Patient (Initial Visit)    No concerns    HISTORY OF PRESENT ILLNESS: This is a 66 y.o. male first visit to this office here to establish care with me. Has multiple chronic medical problems as follows: 1.  Coronary artery disease with history of stent placements: Sees cardiologist on a regular basis 2.  Hypertension 3.  Dyslipidemia 4.  Diabetes insulin-dependent: Sees endocrinologist on a regular basis 5.  Peripheral vascular disease 6.  History of GERD Has no complaints or medical concerns today.  HPI   Prior to Admission medications   Medication Sig Start Date End Date Taking? Authorizing Provider  aspirin 81 MG chewable tablet Chew by mouth.   Yes [provider]  B-D UF III MINI PEN NEEDLES 31G X 5 MM MISC Inject into the skin 3 (three) times daily. 12/06/20  Yes [provider]  cholecalciferol (VITAMIN D) 1000 UNITS tablet Take 1,000 Units by mouth daily.   Yes [provider]  cyclobenzaprine (FLEXERIL) 10 MG tablet TAKE 1 TABLET BY MOUTH THREE TIMES A DAY AS NEEDED FOR MUSCLE SPASMS 05/04/21  Yes Gatha Mayer, MD  DULoxetine (CYMBALTA) 30 MG capsule Take by mouth. 06/16/20  Yes [provider]  ergocalciferol (VITAMIN D2) 1.25 MG (50000 UT) capsule TAKE ONE CAPSULY BY MOUTH ONCE A WEEK 04/09/20  Yes [provider]  famotidine (PEPCID) 40 MG tablet TAKE 1 TABLET BY MOUTH EVERYDAY AT BEDTIME 10/28/20  Yes Gatha Mayer, MD  FARXIGA 10 MG TABS tablet Take 10 mg by mouth daily. 12/14/20  Yes [provider]  fenofibrate 160 MG tablet Take 1 tablet by mouth daily. 10/28/20  Yes [provider]  ferrous sulfate 325 (65 FE) MG tablet Take 325 mg by mouth 2 (two) times daily.   Yes [provider]  furosemide (LASIX) 20 MG tablet TAKE 1 TABLET (20 MG TOTAL) BY MOUTH DAILY. PLEASE CONTACT OFFICE FOR APPOINTMENT NEED OV. 05/01/17  Yes [name],  Nadean Corwin, MD  glucose blood (ONETOUCH VERIO) test strip CHECK SUGARS 4 TIMES A DAY DX E11.9 11/26/20  Yes [provider]  Insulin Degludec (TRESIBA FLEXTOUCH [place]) Inject 40 Units into the skin daily.   Yes [provider]  Insulin Pen Needle (B-D UF III MINI PEN NEEDLES) 31G X 5 MM MISC CHECK THREE TIMES DAILY DX E11.9 05/10/20  Yes [provider]  Insulin Pen Needle (B-D UF III MINI PEN NEEDLES) 31G X 5 MM MISC CHECK THREE TIMES DAILY DX E11.9 05/10/20  Yes [provider]  Insulin Syringe-Needle U-100 31G X 5/16" 0.3 ML MISC CHECK THREE TIMES DAILY DX E11.9 05/10/20  Yes [provider]  Lancets (ONETOUCH DELICA PLUS [id number]) [place] Apply topically. 12/10/20  Yes [provider]  levocetirizine (XYZAL) 5 MG tablet SMARTSIG:1 Tablet(s) By Mouth Every Evening 10/05/20  Yes [provider]  lisinopril (PRINIVIL,ZESTRIL) 20 MG tablet TAKE 1 TABLET (20 MG TOTAL) BY MOUTH DAILY. 04/20/14  Yes [name], Nadean Corwin, MD  metoCLOPramide (REGLAN) 10 MG tablet TAKE 1 TABLET BY MOUTH EVERY 6 HOURS AS NEEDED FOR NAUSEA 07/13/21  Yes Gatha Mayer, MD  metoprolol tartrate (LOPRESSOR) 25 MG tablet TAKE 1 TABLET (25 MG TOTAL) BY MOUTH 2 (TWO) TIMES DAILY. 04/06/14  Yes Lorretta Harp, MD  montelukast (SINGULAIR) 10 MG tablet Take 1 tablet by mouth daily. 06/13/18  Yes [provider]  nitroGLYCERIN (NITROSTAT)  0.4 MG SL tablet Place 1 tablet (0.4 mg total) under the tongue every 5 (five) minutes as needed for chest pain. 08/10/20  Yes [name], Nadean Corwin, MD  NOVOLOG FLEXPEN 100 UNIT/ML FlexPen Inject 17-20 Units into the skin 3 (three) times daily. 12/27/18  Yes [provider]  omeprazole (PRILOSEC) 40 MG capsule Take 40 mg by mouth daily. 10/26/20  Yes [provider]  [facility] VERIO test strip 4 (four) times daily. 11/26/20  Yes [provider]  pravastatin (PRAVACHOL) 40 MG tablet TAKE 1 TABLET BY MOUTH DAILY. PLEASE KEEP  UPCOMING APPT IN JANUARY 2023 05/16/21  Yes [name], Nadean Corwin, MD  amoxicillin-clavulanate (AUGMENTIN) 875-125 MG tablet SMARTSIG:1 Tablet(s) By Mouth Every 12 Hours Patient not taking: Reported on 08/03/2021 06/08/21   [provider]  dicyclomine (BENTYL) 10 MG capsule TAKE 1 CAPSULE BY MOUTH 2 TIMES DAILY. Patient not taking: Reported on 08/03/2021 05/16/21   Gatha Mayer, MD    Allergies  Allergen Reactions   Metformin Diarrhea    diarrhea diarrhea    Robaxin [Methocarbamol] Itching and Rash    Patient Active Problem List   Diagnosis Date Noted   Depression 02/01/2021   Gastro-esophageal reflux disease with esophagitis 02/01/2021   Otalgia of right ear 12/01/2020   Gastric polyps - hyperplastic 03/19/2019   Diabetic autonomic neuropathy ([place]) 03/19/2019   Benign prostatic hyperplasia 01/16/2019   Chronic low back pain 01/16/2019   Iron deficiency anemia 01/16/2019   Dyslipidemia 03/10/2016   Atherosclerotic heart disease of native coronary artery without angina pectoris 05/03/2015   Diabetic peripheral neuropathy associated with type 2 diabetes mellitus ([place]) 05/03/2015   Right leg injury, sequela 05/03/2015   Enlarged prostate without lower urinary tract symptoms (luts) 04/30/2015   Major depressive disorder, recurrent, 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   Peripheral vascular disease ([place]) 04/30/2015   Hx of adenomatous colonic polyps 06/11/2014   Hyperlipidemia 02/25/2014   Diabetic gastroparesis ([place]) 02/17/2014   Neuropathy 12/03/2013   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   Presence of aortocoronary bypass graft 02/11/2012   Coronary arteriography abnormal 02/06/2012   Chronic kidney disease, stage 2 (mild) 01/29/2012   Type 1 diabetes mellitus with complication ([place]) 22/04/5425   Essential hypertension 07/27/2007   GERD 07/27/2007   ARTHRITIS 07/27/2007   NEPHROLITHIASIS, HX OF 07/27/2007    Past Medical History:  Diagnosis Date   Anemia    Anxiety    Arthritis    BPH  (benign prostatic hypertrophy)    Cataract    Chronic renal insufficiency, stage III (moderate) ([place])    [place] kidney   Coronary artery disease    Dr Debara Pickett    Depression    Diabetes mellitus without complication ([place]) 0623   insulin dependent   Diabetic autonomic neuropathy ([place]) 03/19/2019   Gastric polyps - hyperplastic 03/19/2019   Gastroparesis due to DM (HCC)    GERD (gastroesophageal reflux disease)    Hx of adenomatous colonic polyps 06/11/2014   Hyperlipidemia    Hypertension    Myocardial infarction ([place])    Neuropathy    PVD (peripheral vascular disease) ([place])    Sleep apnea 2010   refuses to use cpap    Past Surgical History:  Procedure Laterality Date   ANKLE FRACTURE SURGERY     CARDIAC CATHETERIZATION  01/30/2012   This demonstrated proximal to mid LAD disease and disease of 2 large OM vessels concerning for surgical disease.   COLONOSCOPY     CORONARY ARTERY BYPASS GRAFT  02/06/2012  Procedure: CORONARY ARTERY BYPASS GRAFTING (CABG); 5- Vessel bypass a LIMA to the LAD, a saphenous vein to the to the diagonal , a saphenous vein to the distal OM, and sequential reverse saphenous vien graft to the posterior descending and 2nd OM. Surgeon: Grace Isaac, MD;  Location: [facility];  Service: Open Heart Surgery;  Laterality: [name];   ESOPHAGOGASTRODUODENOSCOPY  2005   LEFT HEART CATHETERIZATION WITH CORONARY ANGIOGRAM [name] 01/30/2012   Procedure: LEFT HEART CATHETERIZATION WITH CORONARY ANGIOGRAM;  Surgeon: Pixie Casino, MD;  Location[facility];  Service: Cardiovascular;  Laterality: [name];   UPPER GASTROINTESTINAL ENDOSCOPY      Social History   Socioeconomic History   Marital status: Widowed    Spouse name: Not on file   Number of children: 4   Years of education: Not on file   Highest education level: Not on file  Occupational History   Occupation: disabled  Tobacco Use   Smoking status: Never   Smokeless tobacco: Never  Vaping Use   Vaping Use: Never  used  Substance and Sexual Activity   Alcohol use: No   Drug use: No   Sexual activity: Not on file  Other Topics Concern   Not on file  Social History Narrative   Widowed, 1 son and 4 daughters   Disabled and dual eligible Medicare and Medicaid   One caffeinated beverage daily   Social Determinants of Health   Financial Resource Strain: Not on file  Food Insecurity: Not on file  Transportation Needs: Not on file  Physical Activity: Not on file  Stress: Not on file  Social Connections: Not on file  Intimate Partner Violence: Not on file    Family History  Problem Relation Age of Onset   Diabetes Father    Hypertension Father    Cancer Sister        unknown   Colon cancer Neg Hx    Esophageal cancer Neg Hx    Rectal cancer Neg Hx    Stomach cancer Neg Hx    Pancreatic cancer Neg Hx      Review of Systems  Constitutional: Negative.  Negative for chills and fever.  HENT: Negative.  Negative for congestion and sore throat.   Respiratory: Negative.  Negative for cough and shortness of breath.   Cardiovascular: Negative.  Negative for chest pain and palpitations.  Gastrointestinal: Negative.  Negative for abdominal pain, diarrhea, nausea and vomiting.  Genitourinary: Negative.   Skin: Negative.  Negative for rash.  Neurological: Negative.  Negative for dizziness and headaches.  All other systems reviewed and are negative.  Today's Vitals   08/03/21 0851  BP: 138/82  Pulse: 60  Temp: 98.3 F (36.8 C)  TempSrc: Oral  SpO2: 95%  Weight: 202 lb 4 oz (91.7 kg)  Height: '5\' 6"'$  (1.676 m)   Body mass index is 32.64 kg/m.  Physical Exam Vitals reviewed.  Constitutional:      Appearance: Normal appearance.  HENT:     Head: Normocephalic.     Mouth/Throat:     Mouth: Mucous membranes are moist.     Pharynx: Oropharynx is clear.  Eyes:     Extraocular Movements: Extraocular movements intact.     Conjunctiva/sclera: Conjunctivae normal.     Pupils: Pupils are  equal, round, and reactive to light.  Cardiovascular:     Rate and Rhythm: Normal rate and regular rhythm.     Pulses: Normal pulses.     Heart sounds: Normal heart  sounds.  Pulmonary:     Effort: Pulmonary effort is normal.     Breath sounds: Normal breath sounds.  Abdominal:     General: There is no distension.     Palpations: Abdomen is soft.     Tenderness: There is no abdominal tenderness.  Musculoskeletal:     Cervical back: No tenderness.     Right lower leg: No edema.     Left lower leg: No edema.  Lymphadenopathy:     Cervical: No cervical adenopathy.  Skin:    General: Skin is warm and dry.     Capillary Refill: Capillary refill takes less than 2 seconds.  Neurological:     General: No focal deficit present.     Mental Status: He is alert and oriented to person, place, and time.  Psychiatric:        Mood and Affect: Mood normal.        Behavior: Behavior normal.   Results for orders placed or performed in visit on 08/03/21 (from the past 24 hour(s))  POCT HgB A1C     Status: Abnormal   Collection Time: 08/03/21 10:03 AM  Result Value Ref Range   Hemoglobin A1C 6.8 (A) 4.0 - 5.6 %   HbA1c POC (<> result, manual entry)     HbA1c, POC (prediabetic range)     HbA1c, POC (controlled diabetic range)       ASSESSMENT & PLAN: A total of 61 minutes was spent with the patient and counseling/coordination of care regarding preparing for this visit, review of available medical records, establishing care with me, review of multiple chronic medical problems and their management, review of all medications, review of most recent blood work results including today's hemoglobin A1c, education on nutrition, review of health maintenance items, prognosis, documentation and need for follow-up.  Problem List Items Addressed This Visit       Cardiovascular and Mediastinum   Hypertension associated with diabetes ([place]) - Primary    Well-controlled hypertension. BP Readings from Last 3  Encounters:  08/03/21 138/82  05/23/21 (!) 166/94  12/23/20 (!) 145/77  Continue lisinopril 20 mg daily and metoprolol tartrate 2 25 mg twice a day. Also takes Lasix 20 mg daily. Well-controlled diabetes on insulin and Farxiga.  Hemoglobin A1c done today. Sees endocrinologist on a regular basis. On Tresiba 40 units daily and Farxiga 10 mg daily.       Relevant Orders   POCT HgB A1C (Completed)   Comprehensive metabolic panel (Completed)   CBC with Differential/Platelet (Completed)   Hemoglobin A1c (Completed)   Atherosclerotic heart disease of native coronary artery without angina pectoris    History of CABG in the past.  Stable.  No recent anginal episodes.  No recent use of nitroglycerin. Sees cardiologist on a regular basis. On metoprolol tartrate 25 mg twice a day Daily baby aspirin.       Peripheral vascular disease (HCC)    Stable.  Takes daily baby aspirin         Digestive   Diabetic gastroparesis (HCC)    Stable.  Takes Reglan 10 mg as needed.         Endocrine   Dyslipidemia associated with type 2 diabetes mellitus ([place])    Stable.  Diet and nutrition discussed.  Cardiovascular risks associated with diabetes, hypertension, dyslipidemia discussed with patient. Continue pravastatin 40 mg and fenofibrate 160 mg daily. Sees cardiologist on a regular basis.       Relevant Orders   Lipid panel (  Completed)     Genitourinary   Chronic kidney disease, stage 2 (mild)    Advised to stay well-hydrated and avoid NSAIDs.         Other   Dyslipidemia   Other Visit Diagnoses     Encounter to establish care            Patient Instructions  Health Maintenance After Age 35 After age 17, you are at a higher risk for certain long-term diseases and infections as well as injuries from falls. Falls are a major cause of broken bones and head injuries in people who are older than age 84. Getting regular preventive care can help to keep you healthy and well.  Preventive care includes getting regular testing and making lifestyle changes as recommended by your health care provider. Talk with your health care provider about: Which screenings and tests you should have. A screening is a test that checks for a disease when you have no symptoms. A diet and exercise plan that is right for you. What should I know about screenings and tests to prevent falls? Screening and testing are the best ways to find a health problem early. Early diagnosis and treatment give you the best chance of managing medical conditions that are common after age 66. Certain conditions and lifestyle choices may make you more likely to have a fall. Your health care provider may recommend: Regular vision checks. Poor vision and conditions such as cataracts can make you more likely to have a fall. If you wear glasses, make sure to get your prescription updated if your vision changes. Medicine review. Work with your health care provider to regularly review all of the medicines you are taking, including over-the-counter medicines. Ask your health care provider about any side effects that may make you more likely to have a fall. Tell your health care provider if any medicines that you take make you feel dizzy or sleepy. Strength and balance checks. Your health care provider may recommend certain tests to check your strength and balance while standing, walking, or changing positions. Foot health exam. Foot pain and numbness, as well as not wearing proper footwear, can make you more likely to have a fall. Screenings, including: Osteoporosis screening. Osteoporosis is a condition that causes the bones to get weaker and break more easily. Blood pressure screening. Blood pressure changes and medicines to control blood pressure can make you feel dizzy. Depression screening. You may be more likely to have a fall if you have a fear of falling, feel depressed, or feel unable to do activities that you used to  do. Alcohol use screening. Using too much alcohol can affect your balance and may make you more likely to have a fall. Follow these instructions at home: Lifestyle Do not drink alcohol if: Your health care provider tells you not to drink. If you drink alcohol: Limit how much you have to: 0-1 drink a day for women. 0-2 drinks a day for men. Know how much alcohol is in your drink. In the [place] one drink equals one 12 oz bottle of beer (355 mL), one 5 oz glass of wine (148 mL), or one 1 oz glass of hard liquor (44 mL). Do not use any products that contain nicotine or tobacco. These products include cigarettes, chewing tobacco, and vaping devices, such as e-cigarettes. If you need help quitting, ask your health care provider. Activity  Follow a regular exercise program to stay fit. This will help you maintain your balance. Ask your  health care provider what types of exercise are appropriate for you. If you need a cane or walker, use it as recommended by your health care provider. Wear supportive shoes that have nonskid soles. Safety  Remove any tripping hazards, such as rugs, cords, and clutter. Install safety equipment such as grab bars in bathrooms and safety rails on stairs. Keep rooms and walkways well-lit. General instructions Talk with your health care provider about your risks for falling. Tell your health care provider if: You fall. Be sure to tell your health care provider about all falls, even ones that seem minor. You feel dizzy, tiredness (fatigue), or off-balance. Take over-the-counter and prescription medicines only as told by your health care provider. These include supplements. Eat a healthy diet and maintain a healthy weight. A healthy diet includes low-fat dairy products, low-fat (lean) meats, and fiber from whole grains, beans, and lots of fruits and vegetables. Stay current with your vaccines. Schedule regular health, dental, and eye exams. Summary Having a healthy  lifestyle and getting preventive care can help to protect your health and wellness after age 73. Screening and testing are the best way to find a health problem early and help you avoid having a fall. Early diagnosis and treatment give you the best chance for managing medical conditions that are more common for people who are older than age 1. Falls are a major cause of broken bones and head injuries in people who are older than age 97. Take precautions to prevent a fall at home. Work with your health care provider to learn what changes you can make to improve your health and wellness and to prevent falls. This information is not intended to replace advice given to you by your health care provider. Make sure you discuss any questions you have with your health care provider. Document Revised: 08/02/2020 Document Reviewed: 08/02/2020 Elsevier Patient Education  [place], MD [facility]

## 2021-08-03 NOTE — Assessment & Plan Note (Addendum)
History of CABG in the past.  Stable.  No recent anginal episodes.  No recent use of nitroglycerin. ?Sees cardiologist on a regular basis. ?On metoprolol tartrate 25 mg twice a day ?Daily baby aspirin. ?

## 2021-08-03 NOTE — Assessment & Plan Note (Signed)
Advised to stay well-hydrated and avoid NSAIDs. ?

## 2021-08-03 NOTE — Assessment & Plan Note (Signed)
Stable.  Takes daily baby aspirin ?

## 2021-08-16 HISTORY — PX: OTHER SURGICAL HISTORY: SHX169

## 2021-08-21 ENCOUNTER — Other Ambulatory Visit: Payer: Self-pay | Admitting: Internal Medicine

## 2021-09-06 ENCOUNTER — Telehealth: Payer: Self-pay

## 2021-09-06 NOTE — Telephone Encounter (Signed)
Dr. Debara Pickett, may patient hold aspirin for upcoming extraction of 11 teeth? He underwent CABG x 5 2013 and had low risk myoview in 05/2021.  Please direct your response to p cv div preop.  Emmaline Life, NP-C    09/06/2021, 1:08 PM [place] [address] Office [phone] Fax [phone]

## 2021-09-06 NOTE — Telephone Encounter (Signed)
Ok to hold aspirin 7 days prior to dental work - resume when safe afterwards.  Dr Debara Pickett

## 2021-09-06 NOTE — Telephone Encounter (Signed)
   Pre-operative Risk Assessment    Patient Name: Joseph [name]  DOB: Apr 24, 1955 MRN: [id number]     Request for Surgical Clearance    Procedure:  Dental Extraction - Amount of Teeth to be Pulled:  11  Date of Surgery:  Clearance TBD                                 Surgeon:  Diona Browner, DMD, PA Surgeon's Group or Practice Name:  Gae Bon, DMD, PA [facility], [place] Phone number:  [phone] Fax number:  [phone]   Type of Clearance Requested:   - Medical    Type of Anesthesia:  General    Additional requests/questions:  Please advise surgeon/provider what medications should be held.  Signed, Elsie Lincoln Linde Wilensky   09/06/2021, 8:46 AM

## 2021-09-07 ENCOUNTER — Telehealth: Payer: Self-pay | Admitting: *Deleted

## 2021-09-07 NOTE — Telephone Encounter (Signed)
Primary Cardiologist:[name] [name], MD  Chart reviewed as part of pre-operative protocol coverage. Because of Joseph [name]'s past medical history and time since last visit, he/she will require a virtual visit/telephone call in order to better assess preoperative cardiovascular risk.  Pre-op covering staff: - Please contact patient, obtain consent, and schedule appointment   Per Dr. Debara Pickett, patient may hold aspirin for 7 days prior to dental work.    Emmaline Life, NP-C    09/07/2021, 7:49 AM [name] [address], [address] Office [phone] Fax [phone]

## 2021-09-07 NOTE — Telephone Encounter (Signed)
S/w the pt's daughter (DPR) who has scheduled a tele pre op appt for her dad (the pt) 09/08/21 @ 1 pm. Med rec and consent are done.    Patient Consent for Virtual Visit        Joseph [name] has provided verbal consent on 09/07/2021 for a virtual visit (video or telephone).   CONSENT FOR VIRTUAL VISIT FOR:  Joseph [name]  By participating in this virtual visit I agree to the following:  I hereby voluntarily request, consent and authorize [facility] and its employed or contracted physicians, physician assistants, nurse practitioners or other licensed health care professionals (the Practitioner), to provide me with telemedicine health care services (the "Services") as deemed necessary by the treating Practitioner. I acknowledge and consent to receive the Services by the Practitioner via telemedicine. I understand that the telemedicine visit will involve communicating with the Practitioner through live audiovisual communication technology and the disclosure of certain medical information by electronic transmission. I acknowledge that I have been given the opportunity to request an in-person assessment or other available alternative prior to the telemedicine visit and am voluntarily participating in the telemedicine visit.  I understand that I have the right to withhold or withdraw my consent to the use of telemedicine in the course of my care at any time, without affecting my right to future care or treatment, and that the Practitioner or I may terminate the telemedicine visit at any time. I understand that I have the right to inspect all information obtained and/or recorded in the course of the telemedicine visit and may receive copies of available information for a reasonable fee.  I understand that some of the potential risks of receiving the Services via telemedicine include:  Delay or interruption in medical evaluation due to technological equipment failure or disruption; Information  transmitted may not be sufficient (e.g. poor resolution of images) to allow for appropriate medical decision making by the Practitioner; and/or  In rare instances, security protocols could fail, causing a breach of personal health information.  Furthermore, I acknowledge that it is my responsibility to provide information about my medical history, conditions and care that is complete and accurate to the best of my ability. I acknowledge that Practitioner's advice, recommendations, and/or decision may be based on factors not within their control, such as incomplete or inaccurate data provided by me or distortions of diagnostic images or specimens that may result from electronic transmissions. I understand that the practice of medicine is not an exact science and that Practitioner makes no warranties or guarantees regarding treatment outcomes. I acknowledge that a copy of this consent can be made available to me via my patient portal ([place]), or I can request a printed copy by calling the office of [place].    I understand that my insurance will be billed for this visit.   I have read or had this consent read to me. I understand the contents of this consent, which adequately explains the benefits and risks of the Services being provided via telemedicine.  I have been provided ample opportunity to ask questions regarding this consent and the Services and have had my questions answered to my satisfaction. I give my informed consent for the services to be provided through the use of telemedicine in my medical care

## 2021-09-07 NOTE — Telephone Encounter (Signed)
S/w the pt's daughter (DPR) who has scheduled a tele pre op appt for her dad (the pt) 09/08/21 @ 1 pm. Med rec and consent are done.

## 2021-09-08 ENCOUNTER — Encounter: Payer: Self-pay | Admitting: Nurse Practitioner

## 2021-09-08 ENCOUNTER — Ambulatory Visit (INDEPENDENT_AMBULATORY_CARE_PROVIDER_SITE_OTHER): Payer: Medicare HMO | Admitting: Nurse Practitioner

## 2021-09-08 DIAGNOSIS — Z01810 Encounter for preprocedural cardiovascular examination: Secondary | ICD-10-CM | POA: Diagnosis not present

## 2021-09-08 NOTE — Progress Notes (Signed)
Virtual Visit via Telephone Note   Because of Joseph [name]'s co-morbid illnesses, he is at least at moderate risk for complications without adequate follow up.  This format is felt to be most appropriate for this patient at this time.  The patient did not have access to video technology/had technical difficulties with video requiring transitioning to audio format only (telephone).  All issues noted in this document were discussed and addressed.  No physical exam could be performed with this format.  Please refer to the patient's chart for his consent to telehealth for [facility].  Evaluation Performed:  Preoperative cardiovascular risk assessment _____________   Date:  09/08/2021   Patient ID:  Joseph [name], DOB 07-27-1955, MRN [id number] Patient Location:  Home Provider location:   Office  Primary Care Provider:  Leeanne Rio, MD Primary Cardiologist:  Pixie Casino, MD  Chief Complaint / Patient Profile   66 y.o. y/o male with a h/o CAD s/p CABG x 5 2013,hypertension, dyslipidemia, CKD  who is pending dental extraction of 11 teeth and presents today for telephonic preoperative cardiovascular risk assessment.  Past Medical History    Past Medical History:  Diagnosis Date   Anemia    Anxiety    Arthritis    BPH (benign prostatic hypertrophy)    Cataract    Chronic renal insufficiency, stage III (moderate) ([place])    [place] [facility]   Coronary artery disease    Dr Debara Pickett    Depression    Diabetes mellitus without complication ([place]) 0017   insulin dependent   Diabetic autonomic neuropathy ([place]) 03/19/2019   Gastric polyps - hyperplastic 03/19/2019   Gastroparesis due to DM ([facility])    GERD (gastroesophageal reflux disease)    Hx of adenomatous colonic polyps 06/11/2014   Hyperlipidemia    Hypertension    Myocardial infarction ([place])    Neuropathy    PVD (peripheral vascular disease) ([place])    Sleep apnea 2010   refuses to use cpap   Past Surgical History:   Procedure Laterality Date   ANKLE FRACTURE SURGERY     CARDIAC CATHETERIZATION  01/30/2012   This demonstrated proximal to mid LAD disease and disease of 2 large OM vessels concerning for surgical disease.   COLONOSCOPY     CORONARY ARTERY BYPASS GRAFT  02/06/2012   Procedure: CORONARY ARTERY BYPASS GRAFTING (CABG); 5- Vessel bypass a LIMA to the LAD, a saphenous vein to the to the diagonal , a saphenous vein to the distal OM, and sequential reverse saphenous vien graft to the posterior descending and 2nd OM. Surgeon: Grace Isaac, MD;  Location: [place];  Service: Open Heart Surgery;  Laterality: [name];   ESOPHAGOGASTRODUODENOSCOPY  2005   LEFT HEART CATHETERIZATION WITH CORONARY ANGIOGRAM [name] 01/30/2012   Procedure: LEFT HEART CATHETERIZATION WITH CORONARY ANGIOGRAM;  Surgeon: Pixie Casino, MD;  Location: [facility] CATH LAB;  Service: Cardiovascular;  Laterality: [name];   UPPER GASTROINTESTINAL ENDOSCOPY      Allergies  Allergies  Allergen Reactions   Metformin Diarrhea    diarrhea diarrhea    Robaxin [Methocarbamol] Itching and Rash    History of Present Illness    Joseph [name] is a 66 y.o. male who presents via audio/video conferencing for a telehealth visit today.  Pt was last seen in cardiology clinic on 05/23/2021 by Dr. Debara Pickett.  At that time Joseph [name] was having some DOE. Dr. Debara Pickett recommended that he undergo nuclear stress test which was read as  low risk.  The patient is now pending procedure as outlined above. Since his last visit, he  denies chest pain, shortness of breath, lower extremity edema, fatigue, palpitations, melena, hematuria, hemoptysis, diaphoresis, weakness, presyncope, syncope, orthopnea, and PND. His daughter reports that his SOB has improved recently. He is able to achieve > 4 METS activity without DOE or chest pain.   Home Medications    Prior to Admission medications   Medication Sig Start Date End Date Taking? Authorizing Provider   amoxicillin-clavulanate (AUGMENTIN) 875-125 MG tablet SMARTSIG:1 Tablet(s) By Mouth Every 12 Hours Patient not taking: Reported on 08/03/2021 06/08/21   [provider]  aspirin 81 MG chewable tablet Chew by mouth.    [provider]  B-D UF III MINI PEN NEEDLES 31G X 5 MM MISC Inject into the skin 3 (three) times daily. 12/06/20   [provider]  cholecalciferol (VITAMIN D) 1000 UNITS tablet Take 1,000 Units by mouth daily.    [provider]  cyclobenzaprine (FLEXERIL) 10 MG tablet TAKE 1 TABLET BY MOUTH THREE TIMES A DAY AS NEEDED FOR MUSCLE SPASMS 08/23/21   Gatha Mayer, MD  dicyclomine (BENTYL) 10 MG capsule TAKE 1 CAPSULE BY MOUTH 2 TIMES DAILY. Patient not taking: Reported on 08/03/2021 05/16/21   Gatha Mayer, MD  DULoxetine (CYMBALTA) 30 MG capsule Take by mouth. 06/16/20   [provider]  ergocalciferol (VITAMIN D2) 1.25 MG (50000 UT) capsule TAKE ONE CAPSULY BY MOUTH ONCE A WEEK 04/09/20   [provider]  famotidine (PEPCID) 40 MG tablet TAKE 1 TABLET BY MOUTH EVERYDAY AT BEDTIME 10/28/20   Gatha Mayer, MD  FARXIGA 10 MG TABS tablet Take 10 mg by mouth daily. 12/14/20   [provider]  fenofibrate 160 MG tablet Take 1 tablet by mouth daily. 10/28/20   [provider]  ferrous sulfate 325 (65 FE) MG tablet Take 325 mg by mouth 2 (two) times daily.    [provider]  furosemide (LASIX) 20 MG tablet TAKE 1 TABLET (20 MG TOTAL) BY MOUTH DAILY. PLEASE CONTACT OFFICE FOR APPOINTMENT NEED OV. 05/01/17   [name], Nadean Corwin, MD  glucose blood (ONETOUCH VERIO) test strip CHECK SUGARS 4 TIMES A DAY DX E11.9 11/26/20   [provider]  Insulin Degludec (TRESIBA FLEXTOUCH [place]) Inject 40 Units into the skin daily.    [provider]  Insulin Pen Needle (B-D UF III MINI PEN NEEDLES) 31G X 5 MM MISC CHECK THREE TIMES DAILY DX E11.9 05/10/20   [provider]  Insulin Pen Needle (B-D UF III MINI PEN  NEEDLES) 31G X 5 MM MISC CHECK THREE TIMES DAILY DX E11.9 05/10/20   [provider]  Insulin Syringe-Needle U-100 31G X 5/16" 0.3 ML MISC CHECK THREE TIMES DAILY DX E11.9 05/10/20   [provider]  Lancets (ONETOUCH DELICA PLUS [id number]) [place] Apply topically. 12/10/20   [provider]  levocetirizine (XYZAL) 5 MG tablet SMARTSIG:1 Tablet(s) By Mouth Every Evening 10/05/20   [provider]  lisinopril (PRINIVIL,ZESTRIL) 20 MG tablet TAKE 1 TABLET (20 MG TOTAL) BY MOUTH DAILY. 04/20/14   [name], Nadean Corwin, MD  metoCLOPramide (REGLAN) 10 MG tablet TAKE 1 TABLET BY MOUTH EVERY 6 HOURS AS NEEDED FOR NAUSEA 07/13/21   Gatha Mayer, MD  metoprolol tartrate (LOPRESSOR) 25 MG tablet TAKE 1 TABLET (25 MG TOTAL) BY MOUTH 2 (TWO) TIMES DAILY. 04/06/14   Lorretta Harp, MD  montelukast (SINGULAIR) 10 MG tablet Take  1 tablet by mouth daily. 06/13/18   [provider]  nitroGLYCERIN (NITROSTAT) 0.4 MG SL tablet Place 1 tablet (0.4 mg total) under the tongue every 5 (five) minutes as needed for chest pain. 08/10/20   [name], Nadean Corwin, MD  NOVOLOG FLEXPEN 100 UNIT/ML FlexPen Inject 17-20 Units into the skin 3 (three) times daily. 12/27/18   [provider]  omeprazole (PRILOSEC) 40 MG capsule Take 40 mg by mouth daily. 10/26/20   [provider]  [facility] VERIO test strip 4 (four) times daily. 11/26/20   [provider]  pravastatin (PRAVACHOL) 40 MG tablet TAKE 1 TABLET BY MOUTH DAILY. PLEASE KEEP UPCOMING APPT IN JANUARY 2023 05/16/21   Pixie Casino, MD    Physical Exam    Vital Signs:  [name] does not have vital signs available for review today.  Given telephonic nature of communication, physical exam is limited. AAOx3. NAD. Normal affect.  Speech and respirations are unlabored.  Accessory Clinical Findings    None  Assessment & Plan    1.  Preoperative Cardiovascular Risk Assessment: He is doing well from a cardiac  perspective and may proceed to procedure without further testing. According to the Revised Cardiac Risk Index (RCRI), his Perioperative Risk of Major Cardiac Event is (%): 0.9. His Functional Capacity in METs is: 7.59 according to the Duke Activity Status Index (DASI).  Per Dr. Debara Pickett, patient may hold aspirin for 7 days prior to dental work.    A copy of this note will be routed to requesting surgeon.  Time:   Today, I have spent 10 minutes with the patient with telehealth technology discussing medical history, symptoms, and management plan.    Emmaline Life, NP-C    09/08/2021, 1:01 PM [place] [address] Office [phone] Fax [phone]

## 2021-09-12 NOTE — H&P (Signed)
  Patient: Joseph [name]  PID: [id number]  DOB: 07/19/1955  SEX: Male   Patient referred by DDS for extraction teeth # 6, 7, 8, 9, 10, 11, 22, 23, 24, 25, 26.  CC: Painful front teeth.  Past Medical History:  High Blood Pressure, s/p 5 V CABG 2013, Sinus Issues, Snoring, Diabetes, Low Blood Sugar, Kidney Trouble, Arthritis, Obese    Medications: Aspirin, BD Insulin, Cyclobenzaprine, Dicyclomine, Duloxetine, Ergocalciferol, Famotidine, Farxiga, Fenofibrate, Ferrous sulfate, Fluconazole, Furosemide, Levocetirizine, Lisinopril, Metoprolol, Metoclopramide, Nitroglycerin, Novolog, Nystatin, Omeprazole, Pravastatin, Sertraline, Symbicort, Tyler Aas    Allergies:     Metformin, Methocarbanol    Surgeries:   Heart Surgery     Social History       Smoking: n           Alcohol:n Drug use: n                            Exam: BMI 32. Caries 6-11, 22-26.    No purulence, edema, fluctuance, trismus. Oral cancer screening negative. Pharynx clear. No lymphadenopathy.  Panorex:Caries 6-11, 22-26.    Assessment: ASA 3 . Non-restorable   teeth # 6-11, 22-26.            Plan: 1. Cardiac clearance obtained.  2. Extraction Teeth #  6-11, 22-26.  Hospital Day surgery.                 Rx: n               Risks and complications explained. Questions answered.   Gae Bon, DMD

## 2021-09-15 ENCOUNTER — Other Ambulatory Visit: Payer: Self-pay

## 2021-09-15 ENCOUNTER — Encounter (HOSPITAL_COMMUNITY): Payer: Self-pay | Admitting: Oral Surgery

## 2021-09-15 NOTE — Progress Notes (Signed)
Anesthesia Chart Review: Same-day work-up  Follows with cardiology for history of CAD s/p CABG x 5 in 2013, HTN, HLD.  Nuclear stress March 2023 was low risk.  Last seen by Stephan Minister, NP for preop evaluation 09/09/2018.  Per note, "Preoperative Cardiovascular Risk Assessment: He is doing well from a cardiac perspective and may proceed to procedure without further testing. According to the Revised Cardiac Risk Index (RCRI), his Perioperative Risk of Major Cardiac Event is (%): 0.9. His Functional Capacity in METs is: 7.59 according to the Duke Activity Status Index (DASI). Per Dr. Debara Pickett, patient may hold aspirin for 7 days prior to dental work."  OSA, not on CPAP  IDDM 2, last A1c 5.8 on 08/03/21.  CKD 3, review of recent labs shows baseline creatinine to be ~1.8.  Patient will need day of surgery labs and evaluation.  EKG 05/23/2021: NSR.  Rate 60.  LAD.  LVH.  Nuclear stress 06/03/2021:   Findings are equivocal. The study is low risk.   No ST deviation was noted. The ECG was negative for ischemia.   LV perfusion is equivocal.  Small, mild intensity, partially reversible inferior defect from apex to base that is most likely secondary to variable diaphragmatic attenuation.   Left ventricular function is normal. Nuclear stress EF: 66 %.  TTE 02/18/2018: - Left ventricle: The cavity size was normal. Wall thickness was    increased in a pattern of mild LVH. Systolic function was normal.    The estimated ejection fraction was in the range of 60% to 65%.    Features are consistent with a pseudonormal left ventricular    filling pattern, with concomitant abnormal relaxation and    increased filling pressure (grade 2 diastolic dysfunction).    Joseph [name] [facility] Short Stay Center/Anesthesiology Phone [phone] 09/15/2021 10:11 AM

## 2021-09-15 NOTE — Progress Notes (Signed)
PCP - Leeanne Rio, MD Cardiologist -   Pixie Casino, MD   EKG - 05/23/21 Chest x-ray -  ECHO - 02/18/18 Cardiac Cath - 2013 CPAP - no (OSA Fasting Blood Sugar:  200s Checks Blood Sugar:  4x/day Blood Thinner Instructions:  Aspirin Instructions: per daughter, LD ASA 09/13/21  ERAS Protcol - clears 0945 COVID TEST-   Anesthesia review: yes  -------------  SDW INSTRUCTIONS:  Your procedure is scheduled on Friday 6/23 . Please report to [facility] Main Entrance "A" at 10:15 A.M., and check in at the Admitting office. Call this number if you have problems the morning of surgery: [phone]   Remember: Do not eat after midnight the night before your surgery  You may drink clear liquids until 09:45 AM the morning of your surgery.   Clear liquids allowed are: Water, Non-Citrus Juices (without pulp), Carbonated Beverages, Clear Tea, Black Coffee Only, and Gatorade   Medications to take morning of surgery with a sip of water include: cyclobenzaprine (FLEXERIL) if needed DULoxetine (CYMBALTA)  metoCLOPramide (REGLAN) if needed metoprolol tartrate (LOPRESSOR)  nitroGLYCERIN (NITROSTAT) if needed omeprazole (PRILOSEC)  pravastatin (PRAVACHOL)   Follow your surgeon's instructions on when to stop Aspirin.  If no instructions were given by your surgeon then you will need to call the office to get those instructions.    As of today, STOP taking any Aleve, Naproxen, Ibuprofen, Motrin, Advil, Goody's, BC's, all herbal medications, fish oil, and all vitamins.  ** PLEASE check your blood sugar the morning of your surgery when you wake up and every 2 hours until you get to the Short Stay unit.  If your blood sugar is less than 70 mg/dL, you will need to treat for low blood sugar: Do not take insulin. Treat a low blood sugar (less than 70 mg/dL) with  cup of clear juice (cranberry or apple), 4 glucose tablets, OR glucose gel. Recheck blood sugar in 15 minutes after treatment (to  make sure it is greater than 70 mg/dL). If your blood sugar is not greater than 70 mg/dL on recheck, call [phone] for further instructions.  FARXIGA 6/22 none 6/23 none  insulin degludec (TRESIBA) 6/22 18 units 6/23 none  NOVOLOG FLEXPEN  6/22 AM usual; PM no bedtime dose 6/23 none; CBG>220 1/2 normal dose    The Morning of Surgery Do not wear jewelry Do not wear lotions, powders, colognes, or deodorant Do not bring valuables to the hospital. [facility] is not responsible for any belongings or valuables.  If you are a smoker, DO NOT Smoke 24 hours prior to surgery  If you wear a CPAP at night please bring your mask the morning of surgery   Remember that you must have someone to transport you home after your surgery, and remain with you for 24 hours if you are discharged the same day.  Please bring cases for contacts, glasses, hearing aids, dentures or bridgework because it cannot be worn into surgery.   Patients discharged the day of surgery will not be allowed to drive home.   Please shower the NIGHT BEFORE/MORNING OF SURGERY (use antibacterial soap like DIAL soap if possible). Wear comfortable clothes the morning of surgery. Oral Hygiene is also important to reduce your risk of infection.  Remember - BRUSH YOUR TEETH THE MORNING OF SURGERY WITH YOUR REGULAR TOOTHPASTE  Patient denies shortness of breath, fever, cough and chest pain.

## 2021-09-15 NOTE — Anesthesia Preprocedure Evaluation (Addendum)
Anesthesia Evaluation  Patient identified by MRN, date of birth, ID band Patient awake    Reviewed: Allergy & Precautions, NPO status , Patient's Chart, lab work & pertinent test results  History of Anesthesia Complications Negative for: history of anesthetic complications  Airway Mallampati: III  TM Distance: >3 FB Neck ROM: Full    Dental  (+) Poor Dentition, Dental Advisory Given   Pulmonary sleep apnea ,    breath sounds clear to auscultation       Cardiovascular hypertension, Pt. on medications and Pt. on home beta blockers + CAD, + Past MI, + CABG and + Peripheral Vascular Disease   Rhythm:Regular   - Left ventricle: The cavity size was normal. Wall thickness was  increased in a pattern of mild LVH. Systolic function was normal.  The estimated ejection fraction was in the range of 60% to 65%.  Features are consistent with a pseudonormal left ventricular  filling pattern, with concomitant abnormal relaxation and  increased filling pressure (grade 2 diastolic dysfunction).    Neuro/Psych PSYCHIATRIC DISORDERS Anxiety Depression  Neuromuscular disease    GI/Hepatic Neg liver ROS, GERD  ,  Endo/Other  diabetesLab Results      Component                Value               Date                      HGBA1C                   6.8 (H)             08/03/2021             Renal/GU CRFRenal diseaseLab Results      Component                Value               Date                      CREATININE               1.91 (H)            09/16/2021           Lab Results      Component                Value               Date                      K                        4.5                 09/16/2021                Musculoskeletal   Abdominal   Peds  Hematology Lab Results      Component                Value               Date                      WBC  8.1                 09/16/2021                HGB                       13.9                09/16/2021                HCT                      42.5                09/16/2021                MCV                      88.4                09/16/2021                PLT                      300                 09/16/2021              Anesthesia Other Findings   Reproductive/Obstetrics                            Anesthesia Physical Anesthesia Plan  ASA: 3  Anesthesia Plan: General   Post-op Pain Management: Minimal or no pain anticipated   Induction: Intravenous  PONV Risk Score and Plan: 2 and Ondansetron and Dexamethasone  Airway Management Planned: Nasal ETT and Video Laryngoscope Planned  Additional Equipment: None  Intra-op Plan:   Post-operative Plan: Extubation in OR  Informed Consent: I have reviewed the patients History and Physical, chart, labs and discussed the procedure including the risks, benefits and alternatives for the proposed anesthesia with the patient or authorized representative who has indicated his/her understanding and acceptance.     Dental advisory given  Plan Discussed with: CRNA  Anesthesia Plan Comments: (PAT note by Antionette Poles, PA-C: Follows with cardiology for history of CAD s/p CABG x 5 in 2013, HTN, HLD.  Nuclear stress March 2023 was low risk.  Last seen by Clair Gulling, NP for preop evaluation 09/09/2018.  Per note, "Preoperative Cardiovascular Risk Assessment: He is doing well from a cardiac perspective and may proceed to procedure without further testing.According to the Revised Cardiac Risk Index (RCRI),hisPerioperative Risk of Major Cardiac Event is (%): 0.9.HisFunctional Capacity in METs is: 7.59according to the Duke Activity Status Index (DASI). Per Dr. Rennis Golden, patient may hold aspirin for 7 days prior to dental work."  OSA, not on CPAP  IDDM 2, last A1c 5.8 on 08/03/21.  CKD 3, review of recent labs shows baseline creatinine to be ~1.8.  Patient will need  day of surgery labs and evaluation.  EKG 05/23/2021: NSR.  Rate 60.  LAD.  LVH.  Nuclear stress 06/03/2021: . Findings are equivocal. The study is low risk. . No ST deviation was noted. The ECG was negative for ischemia. . LV perfusion is equivocal. Small, mild intensity, partially reversible inferior defect from apex to base that is most likely secondary to variable diaphragmatic attenuation. Marland Kitchen  Left ventricular function is normal. Nuclear stress EF: 66 %.  TTE 02/18/2018: - Left ventricle: The cavity size was normal. Wall thickness was  increased in a pattern of mild LVH. Systolic function was normal.  The estimated ejection fraction was in the range of 60% to 65%.  Features are consistent with a pseudonormal left ventricular  filling pattern, with concomitant abnormal relaxation and  increased filling pressure (grade 2 diastolic dysfunction).   )      Anesthesia Quick Evaluation

## 2021-09-16 ENCOUNTER — Other Ambulatory Visit: Payer: Self-pay

## 2021-09-16 ENCOUNTER — Ambulatory Visit (HOSPITAL_COMMUNITY)
Admission: RE | Admit: 2021-09-16 | Discharge: 2021-09-16 | Disposition: A | Discharge status: Home / Self Care | Payer: Medicare HMO | Attending: Oral Surgery | Admitting: Oral Surgery

## 2021-09-16 ENCOUNTER — Ambulatory Visit (HOSPITAL_COMMUNITY): Payer: Medicare HMO | Admitting: Physician Assistant

## 2021-09-16 ENCOUNTER — Encounter (HOSPITAL_COMMUNITY): Payer: Self-pay | Admitting: Oral Surgery

## 2021-09-16 ENCOUNTER — Ambulatory Visit (HOSPITAL_BASED_OUTPATIENT_CLINIC_OR_DEPARTMENT_OTHER): Payer: Medicare HMO | Admitting: Physician Assistant

## 2021-09-16 ENCOUNTER — Encounter (HOSPITAL_COMMUNITY)
Admission: RE | Disposition: A | Discharge status: Home / Self Care | Payer: Self-pay | Source: Home / Self Care | Attending: Oral Surgery

## 2021-09-16 DIAGNOSIS — E785 Hyperlipidemia, unspecified: Secondary | ICD-10-CM | POA: Insufficient documentation

## 2021-09-16 DIAGNOSIS — Z794 Long term (current) use of insulin: Secondary | ICD-10-CM | POA: Insufficient documentation

## 2021-09-16 DIAGNOSIS — Z79899 Other long term (current) drug therapy: Secondary | ICD-10-CM | POA: Insufficient documentation

## 2021-09-16 DIAGNOSIS — I2510 Atherosclerotic heart disease of native coronary artery without angina pectoris: Secondary | ICD-10-CM

## 2021-09-16 DIAGNOSIS — F32A Depression, unspecified: Secondary | ICD-10-CM | POA: Insufficient documentation

## 2021-09-16 DIAGNOSIS — K219 Gastro-esophageal reflux disease without esophagitis: Secondary | ICD-10-CM | POA: Insufficient documentation

## 2021-09-16 DIAGNOSIS — K029 Dental caries, unspecified: Secondary | ICD-10-CM | POA: Diagnosis not present

## 2021-09-16 DIAGNOSIS — E1151 Type 2 diabetes mellitus with diabetic peripheral angiopathy without gangrene: Secondary | ICD-10-CM | POA: Diagnosis not present

## 2021-09-16 DIAGNOSIS — K0889 Other specified disorders of teeth and supporting structures: Secondary | ICD-10-CM | POA: Diagnosis not present

## 2021-09-16 DIAGNOSIS — I129 Hypertensive chronic kidney disease with stage 1 through stage 4 chronic kidney disease, or unspecified chronic kidney disease: Secondary | ICD-10-CM | POA: Diagnosis not present

## 2021-09-16 DIAGNOSIS — E1122 Type 2 diabetes mellitus with diabetic chronic kidney disease: Secondary | ICD-10-CM | POA: Diagnosis not present

## 2021-09-16 DIAGNOSIS — G709 Myoneural disorder, unspecified: Secondary | ICD-10-CM | POA: Diagnosis not present

## 2021-09-16 DIAGNOSIS — G4733 Obstructive sleep apnea (adult) (pediatric): Secondary | ICD-10-CM | POA: Diagnosis not present

## 2021-09-16 DIAGNOSIS — I10 Essential (primary) hypertension: Secondary | ICD-10-CM | POA: Diagnosis not present

## 2021-09-16 DIAGNOSIS — F419 Anxiety disorder, unspecified: Secondary | ICD-10-CM | POA: Diagnosis not present

## 2021-09-16 DIAGNOSIS — Z951 Presence of aortocoronary bypass graft: Secondary | ICD-10-CM | POA: Diagnosis not present

## 2021-09-16 DIAGNOSIS — I252 Old myocardial infarction: Secondary | ICD-10-CM

## 2021-09-16 DIAGNOSIS — N183 Chronic kidney disease, stage 3 unspecified: Secondary | ICD-10-CM | POA: Insufficient documentation

## 2021-09-16 HISTORY — PX: TOOTH EXTRACTION: SHX859

## 2021-09-16 LAB — BASIC METABOLIC PANEL
Anion gap: 12 (ref 5–15)
BUN: 24 mg/dL — ABNORMAL HIGH (ref 8–23)
CO2: 24 mmol/L (ref 22–32)
Calcium: 9.2 mg/dL (ref 8.9–10.3)
Chloride: 104 mmol/L (ref 98–111)
Creatinine, Ser: 1.91 mg/dL — ABNORMAL HIGH (ref 0.61–1.24)
GFR, Estimated: 38 mL/min — ABNORMAL LOW (ref >60)
Glucose, Bld: 157 mg/dL — ABNORMAL HIGH (ref 70–99)
Potassium: 4.5 mmol/L (ref 3.5–5.1)
Sodium: 140 mmol/L (ref 135–145)

## 2021-09-16 LAB — CBC
HCT: 42.5 % (ref 39.0–52.0)
Hemoglobin: 13.9 g/dL (ref 13.0–17.0)
MCH: 28.9 pg (ref 26.0–34.0)
MCHC: 32.7 g/dL (ref 30.0–36.0)
MCV: 88.4 fL (ref 80.0–100.0)
Platelets: 300 10*3/uL (ref 150–400)
RBC: 4.81 MIL/uL (ref 4.22–5.81)
RDW: 13.1 % (ref 11.5–15.5)
WBC: 8.1 10*3/uL (ref 4.0–10.5)
nRBC: 0 % (ref 0.0–0.2)

## 2021-09-16 LAB — GLUCOSE, CAPILLARY
Glucose-Capillary: 170 mg/dL — ABNORMAL HIGH (ref 70–99)
Glucose-Capillary: 146 mg/dL — ABNORMAL HIGH (ref 70–99)

## 2021-09-16 SURGERY — DENTAL RESTORATION/EXTRACTIONS
Anesthesia: General | Site: Mouth

## 2021-09-16 MED ORDER — MIDAZOLAM HCL 2 MG/2ML IJ SOLN
INTRAMUSCULAR | Status: DC | PRN
  Administered 2021-09-16: 2 mg via INTRAVENOUS

## 2021-09-16 MED ORDER — MIDAZOLAM HCL 2 MG/2ML IJ SOLN
INTRAMUSCULAR | Status: AC
Start: 2021-09-16 — End: ?
  Filled 2021-09-16: qty 2

## 2021-09-16 MED ORDER — CEFAZOLIN SODIUM-DEXTROSE 2-4 GM/100ML-% IV SOLN
INTRAVENOUS | Status: AC
  Filled 2021-09-16: qty 100

## 2021-09-16 MED ORDER — LIDOCAINE-EPINEPHRINE 2 %-1:100000 IJ SOLN
INTRAMUSCULAR | Status: AC
  Filled 2021-09-16: qty 1

## 2021-09-16 MED ORDER — INSULIN ASPART 100 UNIT/ML IJ SOLN: 0.0000 [IU] | INTRAMUSCULAR | Status: DC | PRN

## 2021-09-16 MED ORDER — CEFAZOLIN SODIUM-DEXTROSE 2-4 GM/100ML-% IV SOLN
2.0000 g | INTRAVENOUS | Status: AC
  Administered 2021-09-16: 2 g via INTRAVENOUS

## 2021-09-16 MED ORDER — OXYMETAZOLINE HCL 0.05 % NA SOLN
NASAL | Status: AC
Start: 2021-09-16 — End: ?
  Filled 2021-09-16: qty 30

## 2021-09-16 MED ORDER — PHENYLEPHRINE 80 MCG/ML (10ML) SYRINGE FOR IV PUSH (FOR BLOOD PRESSURE SUPPORT)
PREFILLED_SYRINGE | INTRAVENOUS | Status: DC | PRN
  Administered 2021-09-16: 160 ug via INTRAVENOUS

## 2021-09-16 MED ORDER — ONDANSETRON HCL 4 MG/2ML IJ SOLN
INTRAMUSCULAR | Status: AC
  Filled 2021-09-16: qty 2

## 2021-09-16 MED ORDER — ACETAMINOPHEN 500 MG PO TABS: 1000.0000 mg | ORAL_TABLET | Freq: Once | ORAL | Status: DC | PRN

## 2021-09-16 MED ORDER — PHENYLEPHRINE 80 MCG/ML (10ML) SYRINGE FOR IV PUSH (FOR BLOOD PRESSURE SUPPORT)
PREFILLED_SYRINGE | INTRAVENOUS | Status: AC
  Filled 2021-09-16: qty 10

## 2021-09-16 MED ORDER — HYDROCODONE-ACETAMINOPHEN 5-325 MG PO TABS: 1.0000 | ORAL_TABLET | Freq: Four times a day (QID) | ORAL | 0 refills | Status: DC | PRN

## 2021-09-16 MED ORDER — LIDOCAINE-EPINEPHRINE 2 %-1:100000 IJ SOLN
INTRAMUSCULAR | Status: AC
Start: 2021-09-16 — End: ?
  Filled 2021-09-16: qty 1

## 2021-09-16 MED ORDER — CHLORHEXIDINE GLUCONATE 0.12 % MT SOLN
15.0000 mL | Freq: Once | OROMUCOSAL | Status: AC
  Administered 2021-09-16: 15 mL via OROMUCOSAL
  Filled 2021-09-16: qty 15

## 2021-09-16 MED ORDER — DEXAMETHASONE SODIUM PHOSPHATE 10 MG/ML IJ SOLN
INTRAMUSCULAR | Status: AC
Start: 2021-09-16 — End: ?
  Filled 2021-09-16: qty 1

## 2021-09-16 MED ORDER — FENTANYL CITRATE (PF) 250 MCG/5ML IJ SOLN
INTRAMUSCULAR | Status: AC
  Filled 2021-09-16: qty 5

## 2021-09-16 MED ORDER — EPHEDRINE 5 MG/ML INJ
INTRAVENOUS | Status: AC
Start: 2021-09-16 — End: ?
  Filled 2021-09-16: qty 5

## 2021-09-16 MED ORDER — LIDOCAINE 2% (20 MG/ML) 5 ML SYRINGE
INTRAMUSCULAR | Status: AC
  Filled 2021-09-16: qty 5

## 2021-09-16 MED ORDER — ACETAMINOPHEN 10 MG/ML IV SOLN: 1000.0000 mg | Freq: Once | INTRAVENOUS | Status: DC | PRN

## 2021-09-16 MED ORDER — LIDOCAINE-EPINEPHRINE 2 %-1:100000 IJ SOLN
INTRAMUSCULAR | Status: DC | PRN
  Administered 2021-09-16: 10 mL via INTRADERMAL

## 2021-09-16 MED ORDER — PROPOFOL 10 MG/ML IV BOLUS
INTRAVENOUS | Status: AC
Start: 2021-09-16 — End: ?
  Filled 2021-09-16: qty 20

## 2021-09-16 MED ORDER — PROPOFOL 10 MG/ML IV BOLUS
INTRAVENOUS | Status: DC | PRN
  Administered 2021-09-16: 140 mg via INTRAVENOUS

## 2021-09-16 MED ORDER — ACETAMINOPHEN 160 MG/5ML PO SOLN: 1000.0000 mg | Freq: Once | ORAL | Status: DC | PRN

## 2021-09-16 MED ORDER — LIDOCAINE 2% (20 MG/ML) 5 ML SYRINGE
INTRAMUSCULAR | Status: DC | PRN
  Administered 2021-09-16: 60 mg via INTRAVENOUS

## 2021-09-16 MED ORDER — OXYCODONE HCL 5 MG/5ML PO SOLN: 5.0000 mg | Freq: Once | ORAL | Status: DC | PRN

## 2021-09-16 MED ORDER — ROCURONIUM BROMIDE 10 MG/ML (PF) SYRINGE
PREFILLED_SYRINGE | INTRAVENOUS | Status: AC
  Filled 2021-09-16: qty 10

## 2021-09-16 MED ORDER — LACTATED RINGERS IV SOLN: INTRAVENOUS | Status: DC

## 2021-09-16 MED ORDER — ORAL CARE MOUTH RINSE: 15.0000 mL | Freq: Once | OROMUCOSAL | Status: AC

## 2021-09-16 MED ORDER — ONDANSETRON HCL 4 MG/2ML IJ SOLN
INTRAMUSCULAR | Status: DC | PRN
  Administered 2021-09-16: 4 mg via INTRAVENOUS

## 2021-09-16 MED ORDER — 0.9 % SODIUM CHLORIDE (POUR BTL) OPTIME
TOPICAL | Status: DC | PRN
  Administered 2021-09-16: 1000 mL

## 2021-09-16 MED ORDER — SODIUM CHLORIDE 0.9 % IR SOLN
Status: DC | PRN
  Administered 2021-09-16: 1000 mL

## 2021-09-16 MED ORDER — DEXAMETHASONE SODIUM PHOSPHATE 10 MG/ML IJ SOLN
INTRAMUSCULAR | Status: DC | PRN
  Administered 2021-09-16: 10 mg via INTRAVENOUS

## 2021-09-16 MED ORDER — FENTANYL CITRATE (PF) 250 MCG/5ML IJ SOLN
INTRAMUSCULAR | Status: DC | PRN
  Administered 2021-09-16 (×2): 50 ug via INTRAVENOUS
  Administered 2021-09-16: 100 ug via INTRAVENOUS

## 2021-09-16 MED ORDER — AMOXICILLIN 500 MG PO CAPS: 500.0000 mg | ORAL_CAPSULE | Freq: Three times a day (TID) | ORAL | 0 refills | Status: DC

## 2021-09-16 MED ORDER — FENTANYL CITRATE (PF) 100 MCG/2ML IJ SOLN: 25.0000 ug | INTRAMUSCULAR | Status: DC | PRN

## 2021-09-16 MED ORDER — OXYCODONE HCL 5 MG PO TABS: 5.0000 mg | ORAL_TABLET | Freq: Once | ORAL | Status: DC | PRN

## 2021-09-16 MED ORDER — ROCURONIUM BROMIDE 10 MG/ML (PF) SYRINGE
PREFILLED_SYRINGE | INTRAVENOUS | Status: DC | PRN
  Administered 2021-09-16: 70 mg via INTRAVENOUS

## 2021-09-16 MED ORDER — INSULIN ASPART 100 UNIT/ML IJ SOLN
INTRAMUSCULAR | Status: AC
  Administered 2021-09-16: 2 [IU] via SUBCUTANEOUS
  Filled 2021-09-16: qty 1

## 2021-09-16 SURGICAL SUPPLY — 39 items
BAG COUNTER SPONGE SURGICOUNT (BAG) IMPLANT
BAG SPNG CNTER NS LX DISP (BAG)
BLADE SURG 15 STRL LF DISP TIS (BLADE) ×2 IMPLANT
BLADE SURG 15 STRL SS (BLADE) ×2
BUR CROSS CUT FISSURE 1.6 (BURR) ×3 IMPLANT
BUR EGG ELITE 4.0 (BURR) ×3 IMPLANT
CANISTER SUCT 3000ML PPV (MISCELLANEOUS) ×3 IMPLANT
COVER SURGICAL LIGHT HANDLE (MISCELLANEOUS) ×3 IMPLANT
DRAPE U-SHAPE 76X120 STRL (DRAPES) ×3 IMPLANT
GAUZE PACKING FOLDED 2  STR (GAUZE/BANDAGES/DRESSINGS) ×2
GAUZE PACKING FOLDED 2 STR (GAUZE/BANDAGES/DRESSINGS) ×2 IMPLANT
GLOVE BIO SURGEON STRL SZ 6.5 (GLOVE) IMPLANT
GLOVE BIO SURGEON STRL SZ7 (GLOVE) IMPLANT
GLOVE BIO SURGEON STRL SZ8 (GLOVE) ×3 IMPLANT
GLOVE BIOGEL PI IND STRL 6.5 (GLOVE) IMPLANT
GLOVE BIOGEL PI IND STRL 7.0 (GLOVE) IMPLANT
GLOVE BIOGEL PI INDICATOR 6.5 (GLOVE)
GLOVE BIOGEL PI INDICATOR 7.0 (GLOVE)
GOWN STRL REUS W/ TWL LRG LVL3 (GOWN DISPOSABLE) ×2 IMPLANT
GOWN STRL REUS W/ TWL XL LVL3 (GOWN DISPOSABLE) ×2 IMPLANT
GOWN STRL REUS W/TWL LRG LVL3 (GOWN DISPOSABLE) ×2
GOWN STRL REUS W/TWL XL LVL3 (GOWN DISPOSABLE) ×2
IV NS 1000ML (IV SOLUTION) ×2
IV NS 1000ML BAXH (IV SOLUTION) ×2 IMPLANT
KIT BASIN OR (CUSTOM PROCEDURE TRAY) ×3 IMPLANT
KIT TURNOVER KIT B (KITS) ×3 IMPLANT
NDL HYPO 25GX1X1/2 BEV (NEEDLE) ×4 IMPLANT
NEEDLE HYPO 25GX1X1/2 BEV (NEEDLE) ×4 IMPLANT
NS IRRIG 1000ML POUR BTL (IV SOLUTION) ×3 IMPLANT
PAD ARMBOARD 7.5X6 YLW CONV (MISCELLANEOUS) ×3 IMPLANT
SLEEVE IRRIGATION ELITE 7 (MISCELLANEOUS) ×3 IMPLANT
SPIKE FLUID TRANSFER (MISCELLANEOUS) ×3 IMPLANT
SPONGE SURGIFOAM ABS GEL 12-7 (HEMOSTASIS) IMPLANT
SUT CHROMIC 3 0 PS 2 (SUTURE) ×3 IMPLANT
SYR BULB IRRIG 60ML STRL (SYRINGE) ×3 IMPLANT
SYR CONTROL 10ML LL (SYRINGE) ×3 IMPLANT
TRAY ENT MC OR (CUSTOM PROCEDURE TRAY) ×3 IMPLANT
TUBING IRRIGATION (MISCELLANEOUS) ×3 IMPLANT
YANKAUER SUCT BULB TIP NO VENT (SUCTIONS) ×3 IMPLANT

## 2021-09-17 ENCOUNTER — Encounter (HOSPITAL_COMMUNITY): Payer: Self-pay | Admitting: Oral Surgery

## 2021-09-22 ENCOUNTER — Other Ambulatory Visit: Payer: Self-pay | Admitting: Internal Medicine

## 2021-09-22 MED ORDER — METOCLOPRAMIDE HCL 10 MG PO TABS: 10.0000 mg | ORAL_TABLET | Freq: Four times a day (QID) | ORAL | 0 refills | Status: DC | PRN

## 2021-09-28 ENCOUNTER — Ambulatory Visit: Payer: Medicare HMO | Admitting: Internal Medicine

## 2021-11-11 ENCOUNTER — Encounter (INDEPENDENT_AMBULATORY_CARE_PROVIDER_SITE_OTHER): Payer: Medicare HMO | Admitting: Ophthalmology

## 2021-11-14 ENCOUNTER — Encounter (INDEPENDENT_AMBULATORY_CARE_PROVIDER_SITE_OTHER): Payer: Medicare HMO | Admitting: Ophthalmology

## 2021-11-14 DIAGNOSIS — H35372 Puckering of macula, left eye: Secondary | ICD-10-CM

## 2021-11-14 DIAGNOSIS — H35033 Hypertensive retinopathy, bilateral: Secondary | ICD-10-CM

## 2021-11-14 DIAGNOSIS — E113593 Type 2 diabetes mellitus with proliferative diabetic retinopathy without macular edema, bilateral: Secondary | ICD-10-CM | POA: Diagnosis not present

## 2021-11-14 DIAGNOSIS — I10 Essential (primary) hypertension: Secondary | ICD-10-CM

## 2021-11-22 ENCOUNTER — Telehealth: Payer: Self-pay | Admitting: Internal Medicine

## 2021-11-22 ENCOUNTER — Other Ambulatory Visit: Payer: Self-pay | Admitting: Emergency Medicine

## 2021-11-22 ENCOUNTER — Other Ambulatory Visit: Payer: Self-pay | Admitting: Internal Medicine

## 2021-11-22 NOTE — Telephone Encounter (Signed)
Pt daughter called to have rx reglan refilled. Pt has an appt with APP on 10/2 for f/u. Thank you.

## 2021-11-22 NOTE — Telephone Encounter (Signed)
Refilled as requested  

## 2021-12-05 ENCOUNTER — Ambulatory Visit (INDEPENDENT_AMBULATORY_CARE_PROVIDER_SITE_OTHER): Payer: Medicare HMO | Admitting: *Deleted

## 2021-12-05 DIAGNOSIS — Z23 Encounter for immunization: Secondary | ICD-10-CM

## 2021-12-05 NOTE — Progress Notes (Signed)
Patient here for his flu shot. Given in right deltoid. Patient tolerated well

## 2021-12-16 ENCOUNTER — Other Ambulatory Visit: Payer: Self-pay | Admitting: Emergency Medicine

## 2021-12-18 ENCOUNTER — Other Ambulatory Visit: Payer: Self-pay | Admitting: Internal Medicine

## 2021-12-22 ENCOUNTER — Other Ambulatory Visit: Payer: Self-pay | Admitting: Emergency Medicine

## 2021-12-22 ENCOUNTER — Ambulatory Visit (INDEPENDENT_AMBULATORY_CARE_PROVIDER_SITE_OTHER): Payer: Medicare HMO | Admitting: Podiatry

## 2021-12-22 DIAGNOSIS — M2142 Flat foot [pes planus] (acquired), left foot: Secondary | ICD-10-CM

## 2021-12-22 DIAGNOSIS — L97511 Non-pressure chronic ulcer of other part of right foot limited to breakdown of skin: Secondary | ICD-10-CM | POA: Diagnosis not present

## 2021-12-22 DIAGNOSIS — M21079 Valgus deformity, not elsewhere classified, unspecified ankle: Secondary | ICD-10-CM | POA: Diagnosis not present

## 2021-12-22 DIAGNOSIS — E108 Type 1 diabetes mellitus with unspecified complications: Secondary | ICD-10-CM | POA: Diagnosis not present

## 2021-12-22 DIAGNOSIS — M2141 Flat foot [pes planus] (acquired), right foot: Secondary | ICD-10-CM

## 2021-12-22 DIAGNOSIS — L84 Corns and callosities: Secondary | ICD-10-CM

## 2021-12-22 MED ORDER — MUPIROCIN 2 % EX OINT: 1.0000 | TOPICAL_OINTMENT | Freq: Two times a day (BID) | CUTANEOUS | 2 refills | Status: DC

## 2021-12-22 NOTE — Patient Instructions (Signed)
Call [phone] to schedule your vascular testing:  [facility] [address], [place], [place] [address]  

## 2021-12-24 NOTE — Progress Notes (Signed)
  Subjective:  Patient ID: Joseph [name], male    DOB: 1955-10-31,  MRN: [id number]  Chief Complaint  Patient presents with   Nail Problem    Thick painful toenails, 3 month follow up    Diabetes    Adjust diabetic shoes/inserts    66 y.o. male presents with the above complaint. History confirmed with patient.  Presents for follow-up they noticed some skin breakdown and blistering on the tip of the right great toe.  There is also thickening of the bilateral second toes  Objective:  Physical Exam: warm, good capillary refill, no trophic changes or ulcerative lesions, nonpalpable DP and PT pulses, he has significant varicosities, normal monofilament exam, and normal sensory exam.  Valgus deformity of ankle is present on the right, there is a preulcerative callus versus partial skin breakdown of the right hallux, there are no signs of infection or deep ulceration    Assessment:   1. Pre-ulcerative calluses   2. Type 1 diabetes mellitus with complication (HCC)   3. Acquired valgus deformity of ankle   4. Pes planus of both feet   5. Skin ulcer of right great toe, limited to breakdown of skin (HCC)   6. Pain due to onychomycosis of toenails of both feet       Plan:  Patient was evaluated and treated and all questions answered.  Still appears to have quite a bit of pressure both from the brace and shoes.  He was remeasured and the brace was adjusted today.  I will see him back in 4 weeks to reevaluate this.  I did recommend daily care with mupirocin ointment I also recommended noninvasive vascular testing it was difficult to palpate his pulses today this may be secondary to edema from venous insufficiency.  Return in about 4 weeks (around 01/19/2022) for check great toe right .

## 2021-12-26 ENCOUNTER — Encounter: Payer: Self-pay | Admitting: Nurse Practitioner

## 2021-12-26 ENCOUNTER — Ambulatory Visit (INDEPENDENT_AMBULATORY_CARE_PROVIDER_SITE_OTHER): Payer: Medicare HMO | Admitting: Nurse Practitioner

## 2021-12-26 VITALS — BP 122/80 | HR 78 | Ht 66.0 in | Wt 196.2 lb

## 2021-12-26 DIAGNOSIS — R109 Unspecified abdominal pain: Secondary | ICD-10-CM | POA: Diagnosis not present

## 2021-12-26 DIAGNOSIS — G8929 Other chronic pain: Secondary | ICD-10-CM

## 2021-12-26 DIAGNOSIS — K219 Gastro-esophageal reflux disease without esophagitis: Secondary | ICD-10-CM

## 2021-12-26 DIAGNOSIS — K3184 Gastroparesis: Secondary | ICD-10-CM | POA: Diagnosis not present

## 2021-12-26 MED ORDER — CYCLOBENZAPRINE HCL 10 MG PO TABS: ORAL_TABLET | ORAL | 3 refills | Status: DC

## 2021-12-26 NOTE — Progress Notes (Signed)
Chief Complaint:  wants refill on flexeril    Assessment &  Plan   # 66 yo male with GERD / diabetic gastroparesis. Here for refill on Flexeril. Doing well.  Continue GERD regimen of am PPI + HS famotidine Continue BID Reglan. Reviewed potential neurologic side effects and advised to stop med and notify us know any any occurred.  Dr. Carlean Purl has him on Flexeril. He is taking it TID. I will refill this since it is reason for today's visit. However I will discuss with Dr. Carlean Purl since this isn't typically a medication we use for abdominal pain ( at least not on a regular basis).   #History colon polyps.  No polyps on last colonoscopy April 2021.   Next colonoscopy due July 24, 1929  HPI   Joseph [name] is a 66 y.o. male known to Dr.  Carlean Purl with a past medical history significant for hyperplastic gastric polyps, GERD, diabetic gastroparesis, DM .  See PMH /PSH for additional history   Joseph [name] was last seen by Dr. Carlean Purl in August 2022 for follow-up on GERD and gastroparesis.  Per that note he intermittently uses Flexeril for abdominal pains which were thought to be related to gastroparesis.  He was kept on Flexeril as needed and scheduled for an EGD for hyperplastic gastric polyp surveillance.  The EGD was remarkable for several gastric polyps which were biopsied.  They all looked benign.  Biopsies were compatible with gastric hyperplastic polyps.  Negative for intestinal metaplasia or dysplasia  Interval History:  Takes Reglan BID, Flexeril TID and Omerpazole in am and pepcid at nightyy. ce daily.     Previous GI Evaluation   Sept 2022 EGD  -Multiple gastric polyps. Biopsied. all ook benign. I decided to take biopsies again. i think might be a good candidate for banding of polyps +/- snaring depedning upon pathology (were hyperplastic and fundic gland in past) - Gastritis. - The examination was otherwise normal.  Surgical [P], distal gastric body polyp - GASTRIC  HYPERPLASTIC POLYP WITH ULCERATION - NEGATIVE FOR INTESTINAL METAPLASIA OR DYSPLASIA 2. Surgical [P], proximal gastric body polyps - GASTRIC HYPERPLASTIC POLYP - NEGATIVE FOR INTESTINAL METAPLASIA OR DYSPLASI  Aug 2021 polyp surveillance colonoscopy - Two diminutive polyps in the sigmoid colon and in the transverse colon, removed with a cold biopsy forceps. Resected and retrieved. - Diverticulosis in the sigmoid colon. - The examination was otherwise normal on direct and retroflexion views. - Personal history of colonic polyps. 2 adenomas 2016 Based on polyp pathology (nonprecancerous) 10-year recall colonoscopy recommended   Imaging     Labs:     Latest Ref Rng & Units 09/16/2021   10:41 AM 08/03/2021   10:16 AM 01/22/2019   12:09 PM  CBC  WBC 4.0 - 10.5 K/uL 8.1  7.3  6.1   Hemoglobin 13.0 - 17.0 g/dL 13.9  13.0  12.0   Hematocrit 39.0 - 52.0 % 42.5  39.4  35.4   Platelets 150 - 400 K/uL 300  265.0  245.0        Latest Ref Rng & Units 08/03/2021   10:16 AM 03/05/2015   11:05 AM 02/05/2012    3:13 PM  Hepatic Function  Total Protein 6.0 - 8.3 g/dL 7.4  7.0  7.3   Albumin 3.5 - 5.2 g/dL 4.3  4.4  3.9   AST 0 - 37 U/L $Remo'23  18  19   'Yjugq$ ALT 0 - 53 U/L 18  14  16  Alk Phosphatase 39 - 117 U/L 29  38  47   Total Bilirubin 0.2 - 1.2 mg/dL 0.6  0.5  0.3      Past Medical History:  Diagnosis Date   Anemia    Anxiety    Arthritis    BPH (benign prostatic hypertrophy)    Cataract    Chronic renal insufficiency, stage III (moderate) (HCC)    [place] kidney   Coronary artery disease    Dr Debara Pickett    Depression    Diabetes mellitus without complication ([place]) 2446   insulin dependent   Diabetic autonomic neuropathy ([place]) 03/19/2019   Gastric polyps - hyperplastic 03/19/2019   Gastroparesis due to DM (HCC)    GERD (gastroesophageal reflux disease)    Hx of adenomatous colonic polyps 06/11/2014   Hyperlipidemia    Hypertension    Myocardial infarction ([place])    Neuropathy     PVD (peripheral vascular disease) ([place])    Sleep apnea 2010   refuses to use cpap    Past Surgical History:  Procedure Laterality Date   ANKLE FRACTURE SURGERY Right    CARDIAC CATHETERIZATION  01/30/2012   This demonstrated proximal to mid LAD disease and disease of 2 large OM vessels concerning for surgical disease.   COLONOSCOPY     CORONARY ARTERY BYPASS GRAFT  02/06/2012   Procedure: CORONARY ARTERY BYPASS GRAFTING (CABG); 5- Vessel bypass a LIMA to the LAD, a saphenous vein to the to the diagonal , a saphenous vein to the distal OM, and sequential reverse saphenous vien graft to the posterior descending and 2nd OM. Surgeon: Grace Isaac, MD;  Location: [facility];  Service: Open Heart Surgery;  Laterality: [name];   ESOPHAGOGASTRODUODENOSCOPY  2005   LEFT HEART CATHETERIZATION WITH CORONARY ANGIOGRAM [name] 01/30/2012   Procedure: LEFT HEART CATHETERIZATION WITH CORONARY ANGIOGRAM;  Surgeon: Pixie Casino, MD;  [facility] C[facility] LAB;  Service: Cardiovascular;  Laterality: [name];   teeth removal  08/16/2021   TOOTH EXTRACTION [name] 09/16/2021   Procedure: DENTAL RESTORATION/EXTRACTIONS;  Surgeon: Diona Browner, DMD;  Location: [facility];  Service: Oral Surgery;  Laterality: [name];   UPPER GASTROINTESTINAL ENDOSCOPY      Current Medications, Allergies, Family History and Social History were reviewed in [employer] record.     Current Outpatient Medications  Medication Sig Dispense Refill   aspirin EC 81 MG tablet Take 81 mg by mouth daily.     B-D UF III MINI PEN NEEDLES 31G X 5 MM MISC Inject into the skin 3 (three) times daily.     B-D UF III MINI PEN NEEDLES 31G X 5 MM MISC CHECK THREE TIMES DAILY DX E11.9 300 each 1   budesonide-formoterol (SYMBICORT) 160-4.5 MCG/ACT inhaler Inhale 2 puffs into the lungs 2 (two) times daily as needed (asthma).     cholecalciferol (VITAMIN D) 1000 UNITS tablet Take 1,000 Units by mouth daily.     cyclobenzaprine (FLEXERIL) 10 MG  tablet TAKE 1 TABLET BY MOUTH THREE TIMES A DAY AS NEEDED FOR MUSCLE SPASMS (Patient taking differently: Take 10 mg by mouth 3 (three) times daily.) 60 tablet 5   DULoxetine (CYMBALTA) 30 MG capsule TAKE 1 CAPSULE BY MOUTH EVERY DAY 90 capsule 1   ergocalciferol (VITAMIN D2) 1.25 MG (50000 UT) capsule Take 50,000 Units by mouth every Wednesday.     famotidine (PEPCID) 40 MG tablet TAKE 1 TABLET BY MOUTH EVERYDAY AT BEDTIME 90 tablet 3   FARXIGA 10 MG TABS tablet  Take 10 mg by mouth daily.     fenofibrate 160 MG tablet Take 160 mg by mouth daily.     ferrous sulfate 325 (65 FE) MG tablet Take 325 mg by mouth daily with breakfast.     furosemide (LASIX) 20 MG tablet TAKE 1 TABLET (20 MG TOTAL) BY MOUTH DAILY. PLEASE CONTACT OFFICE FOR APPOINTMENT NEED OV. 30 tablet 0   glucose blood (ONETOUCH VERIO) test strip CHECK SUGARS 4 TIMES A DAY DX E11.9     Insulin Pen Needle (B-D UF III MINI PEN NEEDLES) 31G X 5 MM MISC CHECK THREE TIMES DAILY DX E11.9     Insulin Pen Needle (B-D UF III MINI PEN NEEDLES) 31G X 5 MM MISC CHECK THREE TIMES DAILY DX E11.9     Insulin Syringe-Needle U-100 31G X 5/16" 0.3 ML MISC CHECK THREE TIMES DAILY DX E11.9     Lancets (ONETOUCH DELICA PLUS [id number]) MISC Apply topically.     levocetirizine (XYZAL) 5 MG tablet Take 5 mg by mouth every evening.     lisinopril (PRINIVIL,ZESTRIL) 20 MG tablet TAKE 1 TABLET (20 MG TOTAL) BY MOUTH DAILY. (Patient taking differently: Take 20 mg by mouth in the morning and at bedtime.) 90 tablet 3   metoCLOPramide (REGLAN) 10 MG tablet TAKE 1 TABLET (10 MG TOTAL) BY MOUTH EVERY 6 (SIX) HOURS AS NEEDED. FOR NAUSEA 60 tablet 0   metoprolol tartrate (LOPRESSOR) 25 MG tablet TAKE 1 TABLET BY MOUTH TWICE A DAY 180 tablet 1   mupirocin ointment (BACTROBAN) 2 % Apply 1 Application topically 2 (two) times daily. 30 g 2   nitroGLYCERIN (NITROSTAT) 0.4 MG SL tablet Place 1 tablet (0.4 mg total) under the tongue every 5 (five) minutes as needed for chest  pain. 20 tablet 3   NOVOLOG FLEXPEN 100 UNIT/ML FlexPen Inject 10-16 Units into the skin 3 (three) times daily.     omeprazole (PRILOSEC) 40 MG capsule TAKE 1 CAPSULE BY MOUTH EVERY DAY 90 capsule 1   ONETOUCH VERIO test strip 4 (four) times daily.     pravastatin (PRAVACHOL) 40 MG tablet TAKE 1 TABLET BY MOUTH DAILY. PLEASE KEEP UPCOMING APPT IN JANUARY 2023 90 tablet 1   TRESIBA FLEXTOUCH 100 UNIT/ML FlexTouch Pen INJECT 0.5 ML (50 UNITS TOTAL) UNDER THE SKIN DAILY. 3 mL 1   HYDROcodone-acetaminophen (NORCO) 5-325 MG tablet Take 1 tablet by mouth every 6 (six) hours as needed for moderate pain. (Patient not taking: Reported on 12/26/2021) 16 tablet 0   No current facility-administered medications for this visit.    Review of Systems: No chest pain. No shortness of breath. No urinary complaints.    Physical Exam  Wt Readings from Last 3 Encounters:  12/26/21 196 lb 3.2 oz (89 kg)  09/16/21 202 lb (91.6 kg)  08/03/21 202 lb 4 oz (91.7 kg)    BP 122/80   Pulse 78   Ht $R'5\' 6"'[place]$  (1.676 m)   Wt 196 lb 3.2 oz (89 kg)   SpO2 98%   BMI 31.67 kg/m  Constitutional:  Generally well appearing male in no acute distress. Psychiatric: Pleasant. Normal mood and affect. Behavior is normal. EENT: Pupils normal.  Conjunctivae are normal. No scleral icterus. Neck supple.  Cardiovascular: Normal rate, regular rhythm. No edema Pulmonary/chest: Effort normal and breath sounds normal. No wheezing, rales or rhonchi. Abdominal: Soft, nondistended, nontender. Bowel sounds active throughout. There are no masses palpable. No hepatomegaly. Neurological: Alert and oriented to person place and time. Skin: Skin is  warm and dry. No rashes noted.  Tye Savoy, NP  12/26/2021, 11:07 AM

## 2021-12-26 NOTE — Patient Instructions (Signed)
If you are age 66 or older, your body mass index should be between 23-30. Your Body mass index is 31.67 kg/m. If this is out of the aforementioned range listed, please consider follow up with your Primary Care Provider.  If you are age 7 or younger, your body mass index should be between 19-25. Your Body mass index is 31.67 kg/m. If this is out of the aformentioned range listed, please consider follow up with your Primary Care Provider.   ________________________________________________________  The [facility] providers would like to encourage you to use [employer] to communicate with providers for non-urgent requests or questions.  Due to long hold times on the telephone, sending your provider a message by [facility] may be a faster and more efficient way to get a response.  Please allow 48 business hours for a response.  Please remember that this is for non-urgent requests.  _______________________________________________________   We have sent the following medications to your pharmacy for you to pick up at your convenience: Flexeril 3 times daily as needed.  It was a pleasure to see you today!  Thank you for trusting me with your gastrointestinal care!

## 2021-12-30 ENCOUNTER — Ambulatory Visit: Payer: Medicare HMO

## 2021-12-30 ENCOUNTER — Telehealth: Payer: Self-pay

## 2021-12-30 NOTE — Telephone Encounter (Signed)
Called patient to complete AWV. Phone rings and someone answers and does not say hello. Patient may reschedule for the next available appointment NHA or CMA.  -S. Collene Massimino,LPN

## 2022-01-09 ENCOUNTER — Encounter (HOSPITAL_COMMUNITY): Payer: Medicare HMO

## 2022-01-11 ENCOUNTER — Ambulatory Visit (HOSPITAL_COMMUNITY)
Admission: RE | Admit: 2022-01-11 | Discharge: 2022-01-11 | Disposition: A | Discharge status: Home / Self Care | Payer: Medicare HMO | Source: Ambulatory Visit | Attending: Podiatry | Admitting: Podiatry

## 2022-01-11 DIAGNOSIS — E108 Type 1 diabetes mellitus with unspecified complications: Secondary | ICD-10-CM | POA: Insufficient documentation

## 2022-01-11 DIAGNOSIS — L84 Corns and callosities: Secondary | ICD-10-CM

## 2022-01-17 ENCOUNTER — Other Ambulatory Visit: Payer: Self-pay | Admitting: Emergency Medicine

## 2022-01-19 ENCOUNTER — Ambulatory Visit (INDEPENDENT_AMBULATORY_CARE_PROVIDER_SITE_OTHER): Payer: Medicare HMO | Admitting: Podiatry

## 2022-01-19 DIAGNOSIS — L84 Corns and callosities: Secondary | ICD-10-CM | POA: Diagnosis not present

## 2022-01-19 NOTE — Patient Instructions (Signed)
More silicone pads can be purchased from:  [email or web address]  

## 2022-01-20 ENCOUNTER — Encounter: Payer: Self-pay | Admitting: Internal Medicine

## 2022-01-20 NOTE — Progress Notes (Signed)
  Subjective:  Patient ID: CORGAN MORMILE, male    DOB: [birth year],  MRN: [id number]  Chief Complaint  Patient presents with   Nail Problem    4 weeks (around 01/19/2022) for check great toe right .    66 y.o. male presents with the above complaint. History confirmed with patient.  Has improved nearly fully resolved he completed the vascular testing  Objective:  Physical Exam: warm, good capillary refill, no trophic changes or ulcerative lesions, nonpalpable DP and PT pulses, he has significant varicosities, normal monofilament exam, and normal sensory exam.  Valgus deformity of ankle is present on the right, preulcerative callus has improved  Noninvasive vascular testing shows essentially unchanged ABIs, there is dampening of the waveforms and TBI, likely small vessel disease  Assessment:   1. Pre-ulcerative calluses       Plan:  Patient was evaluated and treated and all questions answered.  We reviewed the results of his vascular testing which likely a small vessel disease and without any impending ulceration or skin breakdown I do not think he likely needs intervention from vascular surgery.  We discussed offloading the lesion further.  A silicone toe cap was dispensed for this.  I reevaluated his brace which has improved after the last adjustment, he was still wearing the shoe insert that goes with his new balance shoes in addition to the brace which I think is causing too much bulk and pressure within his shoes and I advised him to remove this and discussed this as well with his daughter who was present.  He will return as needed if this worsens.  No follow-ups on file.

## 2022-02-01 ENCOUNTER — Ambulatory Visit: Payer: Medicare HMO | Admitting: Emergency Medicine

## 2022-02-01 NOTE — Progress Notes (Cosign Needed Addendum)
Subjective:   Joseph [name] is a 66 y.o. male who presents for an Initial Medicare Annual Wellness Visit. I connected with  [name] on 02/03/22 by a audio enabled telemedicine application and verified that I am speaking with the correct person using two identifiers.  Patient Location: Home  Provider Location: Home Office  I discussed the limitations of evaluation and management by telemedicine. The patient expressed understanding and agreed to proceed.  Review of Systems    Deferred to PCP Cardiac Risk Factors include: advanced age (>70mn, >>74women);diabetes mellitus;dyslipidemia;male gender;hypertension     Objective:    Today's Vitals   02/03/22 1125  PainSc: 1    There is no height or weight on file to calculate BMI.     02/03/2022   11:35 AM 09/16/2021   10:32 AM 06/05/2014    3:11 PM 02/08/2012    8:00 AM 02/05/2012    2:44 PM 01/29/2012    2:52 PM  Advanced Directives  Does Patient Have a Medical Advance Directive? No No No Patient does not have advance directive;Patient would like information Patient does not have advance directive;Patient would like information Patient does not have advance directive  Would patient like information on creating a medical advance directive? No - Patient declined   Advance directive packet given Advance directive packet given   Pre-existing out of facility DNR order (yellow form or pink MOST form)    No No No    Current Medications (verified) Outpatient Encounter Medications as of 02/03/2022  Medication Sig   aspirin EC 81 MG tablet Take 81 mg by mouth daily.   B-D UF III MINI PEN NEEDLES 31G X 5 MM MISC Inject into the skin 3 (three) times daily.   B-D UF III MINI PEN NEEDLES 31G X 5 MM MISC CHECK THREE TIMES DAILY DX E11.9   budesonide-formoterol (SYMBICORT) 160-4.5 MCG/ACT inhaler Inhale 2 puffs into the lungs 2 (two) times daily as needed (asthma).   cholecalciferol (VITAMIN D) 1000 UNITS tablet Take 1,000 Units by  mouth daily.   cyclobenzaprine (FLEXERIL) 10 MG tablet TAKE 1 TABLET BY MOUTH THREE TIMES A DAY AS NEEDED FOR MUSCLE SPASMS   DULoxetine (CYMBALTA) 30 MG capsule TAKE 1 CAPSULE BY MOUTH EVERY DAY   ergocalciferol (VITAMIN D2) 1.25 MG (50000 UT) capsule Take 50,000 Units by mouth every Wednesday.   famotidine (PEPCID) 40 MG tablet TAKE 1 TABLET BY MOUTH EVERYDAY AT BEDTIME   FARXIGA 10 MG TABS tablet Take 10 mg by mouth daily.   fenofibrate 160 MG tablet Take 160 mg by mouth daily.   ferrous sulfate 325 (65 FE) MG tablet Take 325 mg by mouth daily with breakfast.   furosemide (LASIX) 20 MG tablet TAKE 1 TABLET (20 MG TOTAL) BY MOUTH DAILY. PLEASE CONTACT OFFICE FOR APPOINTMENT NEED OV.   glucose blood (ONETOUCH VERIO) test strip CHECK SUGARS 4 TIMES A DAY DX E11.9   Insulin Pen Needle (B-D UF III MINI PEN NEEDLES) 31G X 5 MM MISC CHECK THREE TIMES DAILY DX E11.9   Insulin Pen Needle (B-D UF III MINI PEN NEEDLES) 31G X 5 MM MISC CHECK THREE TIMES DAILY DX E11.9   Insulin Syringe-Needle U-100 31G X 5/16" 0.3 ML MISC CHECK THREE TIMES DAILY DX E11.9   Lancets (ONETOUCH DELICA PLUS [id number] MISC Apply topically.   levocetirizine (XYZAL) 5 MG tablet Take 5 mg by mouth every evening.   lisinopril (PRINIVIL,ZESTRIL) 20 MG tablet TAKE 1 TABLET (20 MG TOTAL) BY  MOUTH DAILY. (Patient taking differently: Take 20 mg by mouth in the morning and at bedtime.)   metoCLOPramide (REGLAN) 10 MG tablet TAKE 1 TABLET (10 MG TOTAL) BY MOUTH EVERY 6 (SIX) HOURS AS NEEDED. FOR NAUSEA   metoprolol tartrate (LOPRESSOR) 25 MG tablet TAKE 1 TABLET BY MOUTH TWICE A DAY   mupirocin ointment (BACTROBAN) 2 % Apply 1 Application topically 2 (two) times daily.   nitroGLYCERIN (NITROSTAT) 0.4 MG SL tablet Place 1 tablet (0.4 mg total) under the tongue every 5 (five) minutes as needed for chest pain.   NOVOLOG FLEXPEN 100 UNIT/ML FlexPen Inject 10-16 Units into the skin 3 (three) times daily.   omeprazole (PRILOSEC) 40 MG capsule  TAKE 1 CAPSULE BY MOUTH EVERY DAY   ONETOUCH VERIO test strip 4 (four) times daily.   pravastatin (PRAVACHOL) 40 MG tablet TAKE 1 TABLET BY MOUTH DAILY. PLEASE KEEP UPCOMING APPT IN JANUARY 2023   TRESIBA FLEXTOUCH 100 UNIT/ML FlexTouch Pen INJECT 0.5 ML (50 UNITS TOTAL) UNDER THE SKIN DAILY.   HYDROcodone-acetaminophen (NORCO) 5-325 MG tablet Take 1 tablet by mouth every 6 (six) hours as needed for moderate pain. (Patient not taking: Reported on 12/26/2021)   No facility-administered encounter medications on file as of 02/03/2022.    Allergies (verified) Metformin and Robaxin [methocarbamol]   History: Past Medical History:  Diagnosis Date   Anemia    Anxiety    Arthritis    BPH (benign prostatic hypertrophy)    Cataract    Chronic renal insufficiency, stage III (moderate) ([place])    [place] [facility]   Coronary artery disease    Dr Debara Pickett    Depression    Diabetes mellitus without complication ([place]) 9767   insulin dependent   Diabetic autonomic neuropathy ([place]) 03/19/2019   Gastric polyps - hyperplastic 03/19/2019   Gastroparesis due to DM ([facility])    GERD (gastroesophageal reflux disease)    Hx of adenomatous colonic polyps 06/11/2014   Hyperlipidemia    Hypertension    Myocardial infarction ([place])    Neuropathy    PVD (peripheral vascular disease) ([place])    Sleep apnea 2010   refuses to use cpap   Past Surgical History:  Procedure Laterality Date   ANKLE FRACTURE SURGERY Right    CARDIAC CATHETERIZATION  01/30/2012   This demonstrated proximal to mid LAD disease and disease of 2 large OM vessels concerning for surgical disease.   COLONOSCOPY     CORONARY ARTERY BYPASS GRAFT  02/06/2012   Procedure: CORONARY ARTERY BYPASS GRAFTING (CABG); 5- Vessel bypass a LIMA to the LAD, a saphenous vein to the to the diagonal , a saphenous vein to the distal OM, and sequential reverse saphenous vien graft to the posterior descending and 2nd OM. Surgeon: Grace Isaac, MD;  Location: [facility];  Service: Open Heart Surgery;  Laterality: [name];   ESOPHAGOGASTRODUODENOSCOPY  2005   LEFT HEART CATHETERIZATION WITH CORONARY ANGIOGRAM [name] 01/30/2012   Procedure: LEFT HEART CATHETERIZATION WITH CORONARY ANGIOGRAM;  Surgeon: Pixie Casino, MD;  Location: [facility] CATH LAB;  Service: Cardiovascular;  Laterality: [name];   teeth removal  08/16/2021   TOOTH EXTRACTION [name] 09/16/2021   Procedure: DENTAL RESTORATION/EXTRACTIONS;  Surgeon: Diona Browner, DMD;  Location: [facility];  Service: Oral Surgery;  Laterality: [name];   UPPER GASTROINTESTINAL ENDOSCOPY     Family History  Problem Relation Age of Onset   Diabetes Father    Hypertension Father    Cancer Sister  unknown   Colon cancer Neg Hx    Esophageal cancer Neg Hx    Rectal cancer Neg Hx    Stomach cancer Neg Hx    Pancreatic cancer Neg Hx    Social History   Socioeconomic History   Marital status: Widowed    Spouse name: Not on file   Number of children: 4   Years of education: Not on file   Highest education level: Not on file  Occupational History   Occupation: disabled  Tobacco Use   Smoking status: Never   Smokeless tobacco: Never  Vaping Use   Vaping Use: Never used  Substance and Sexual Activity   Alcohol use: No   Drug use: No   Sexual activity: Not Currently  Other Topics Concern   Not on file  Social History Narrative   Widowed, 1 son and 4 daughters   Disabled and dual eligible Medicare and Medicaid   One caffeinated beverage daily   Social Determinants of Health   Financial Resource Strain: Low Risk  (02/03/2022)   Overall Financial Resource Strain (CARDIA)    Difficulty of Paying Living Expenses: Not hard at all  Food Insecurity: No Food Insecurity (02/03/2022)   Hunger Vital Sign    Worried About Running Out of Food in the Last Year: Never true    [place] in the Last Year: Never true  Transportation Needs: No Transportation Needs (02/03/2022)   PRAPARE - Radiographer, therapeutic (Medical): No    Lack of Transportation (Non-Medical): No  Physical Activity: Insufficiently Active (02/03/2022)   Exercise Vital Sign    Days of Exercise per Week: 3 days    Minutes of Exercise per Session: 30 min  Stress: No Stress Concern Present (02/03/2022)   [place]    Feeling of Stress : Not at all  Social Connections: Socially Isolated (02/03/2022)   Social Connection and Isolation Panel [NHANES]    Frequency of Communication with Friends and Family: More than three times a week    Frequency of Social Gatherings with Friends and Family: More than three times a week    Attends Religious Services: Never    Marine scientist or Organizations: No    Attends Archivist Meetings: Never    Marital Status: Widowed    Tobacco Counseling Counseling given: Not Answered   Clinical Intake:  Pre-visit preparation completed: Yes  Pain : 0-10 Pain Score: 1  Pain Type: Chronic pain Pain Location: Ankle Pain Orientation: Right Pain Descriptors / Indicators: Aching, Dull Pain Relieving Factors: rest  Pain Relieving Factors: rest  Nutritional Status: BMI > 30  Obese Nutritional Risks: None Diabetes: Yes CBG done?: No (phone visit) Did pt. bring in CBG monitor from home?: No (phone visit)  How often do you need to have someone help you when you read instructions, pamphlets, or other written materials from your doctor or pharmacy?: 2 - Rarely What is the last grade level you completed in school?: 12  Diabetic? Yes Nutrition Risk Assessment:  Has the patient had any N/V/D within the last 2 months?  No  Does the patient have any non-healing wounds?  No  Has the patient had any unintentional weight loss or weight gain?  No   Diabetes:  Is the patient diabetic?  Yes  If diabetic, was a CBG obtained today?  No , phone visit Did the patient bring in their glucometer from  home?  No ,  phone visit How often do you monitor your CBG's? 3 times a day.   Financial Strains and Diabetes Management:  Are you having any financial strains with the device, your supplies or your medication? No .  Does the patient want to be seen by Chronic Care Management for management of their diabetes?  No  Would the patient like to be referred to a Nutritionist or for Diabetic Management?  No   Diabetic Exams:  Diabetic Eye Exam: Overdue for diabetic eye exam. Pt has been advised about the importance in completing this exam. Patient advised to call and schedule an eye exam. Diabetic Foot Exam: Completed [facility] DPM 01/19/22   Interpreter Needed?: No  Information entered by :: Emelia Loron RN   Activities of Daily Living    02/03/2022   11:35 AM 09/16/2021   10:39 AM  In your present state of health, do you have any difficulty performing the following activities:  Hearing? 0   Vision? 0   Difficulty concentrating or making decisions? 0   Walking or climbing stairs? 0   Dressing or bathing? 0   Doing errands, shopping? 0 0  Preparing Food and eating ? N   Using the Toilet? N   In the past six months, have you accidently leaked urine? N   Do you have problems with loss of bowel control? N   Managing your Medications? N   Managing your Finances? N   Housekeeping or managing your Housekeeping? N     Patient Care Team: Horald Pollen, MD as PCP - General (Internal Medicine) Debara Pickett Nadean Corwin, MD as PCP - Cardiology (Cardiology) Debara Pickett Nadean Corwin, MD as Consulting Physician (Cardiology) Hayden Pedro, MD as Consulting Physician (Ophthalmology) Monna Fam, MD as Consulting Physician (Ophthalmology)  Indicate any recent Medical Services you may have received from other than [facility] providers in the past year (date may be approximate).     Assessment:   This is a routine wellness examination for Joseph [name].  Hearing/Vision screen No results found.  Dietary issues and  exercise activities discussed: Current Exercise Habits: Home exercise routine, Type of exercise: walking, Time (Minutes): 30, Frequency (Times/Week): 3, Weekly Exercise (Minutes/Week): 90, Intensity: Mild, Exercise limited by: orthopedic condition(s)   Goals Addressed             This Visit's Progress    Patient Stated       Increase my physical activity by walking routinely       Depression Screen    02/03/2022   11:33 AM 08/03/2021    8:54 AM  PHQ 2/9 Scores  PHQ - 2 Score 1 2  PHQ- 9 Score  4    Fall Risk    08/03/2021    8:54 AM  Fall Risk   Falls in the past year? 0    FALL RISK PREVENTION PERTAINING TO THE HOME:  Any stairs in or around the home? Yes  If so, are there any without handrails? Yes  Home free of loose throw rugs in walkways, pet beds, electrical cords, etc? Yes  Adequate lighting in your home to reduce risk of falls? Yes   ASSISTIVE DEVICES UTILIZED TO PREVENT FALLS:  Life alert? No  Use of a cane, walker or w/c? Yes  Grab bars in the bathroom? Yes  Shower chair or bench in shower? Yes  Elevated toilet seat or a handicapped toilet? No   Cognitive Function:        02/03/2022  11:36 AM  6CIT Screen  What Year? 0 points  What month? 0 points  What time? 0 points  Count back from 20 0 points  Months in reverse 2 points  Repeat phrase 2 points  Total Score 4 points    Immunizations Immunization History  Administered Date(s) Administered   Fluad Quad(high Dose 65+) 12/05/2021   Influenza Inj Mdck Quad Pf 12/24/2019   Influenza Split 03/27/2010, 12/25/2012   Influenza, Quadrivalent, Recombinant, Inj, Pf 12/11/2017   Influenza,inj,Quad PF,6+ Mos 12/03/2018   Influenza-Unspecified 12/12/2012, 12/29/2013, 04/12/2021   Moderna Sars-Covid-2 Vaccination 05/27/2019, 06/24/2019, 02/09/2020   PNEUMOCOCCAL CONJUGATE-20 11/02/2020   Pneumococcal Polysaccharide-23 01/01/2007   Tdap 06/10/2021   Zoster, Live 05/19/2015    TDAP status: Up  to date  Flu Vaccine status: Up to date  Pneumococcal vaccine status: Up to date  Covid-19 vaccine status: Information provided on how to obtain vaccines.   Qualifies for Shingles Vaccine? Yes   Zostavax completed No   Shingrix Completed?: No.    Education has been provided regarding the importance of this vaccine. Patient has been advised to call insurance company to determine out of pocket expense if they have not yet received this vaccine. Advised may also receive vaccine at local pharmacy or Health Dept. Verbalized acceptance and understanding.  Screening Tests Health Maintenance  Topic Date Due   FOOT EXAM  Never done   Hepatitis C Screening  Never done   Zoster Vaccines- Shingrix (1 of 2) Never done   COVID-19 Vaccine (4 - Moderna series) 04/05/2020   OPHTHALMOLOGY EXAM  05/07/2021   Diabetic kidney evaluation - Urine ACR  11/02/2021   HEMOGLOBIN A1C  02/03/2022   Diabetic kidney evaluation - GFR measurement  09/17/2022   Medicare Annual Wellness (AWV)  02/04/2023   COLONOSCOPY (Pts 45-37yr Insurance coverage will need to be confirmed)  11/06/2029   TETANUS/TDAP  06/11/2031   Pneumonia Vaccine 66 Years old  Completed   INFLUENZA VACCINE  Completed   HPV VACCINES  Aged Out    Health Maintenance  Health Maintenance Due  Topic Date Due   FOOT EXAM  Never done   Hepatitis C Screening  Never done   Zoster Vaccines- Shingrix (1 of 2) Never done   COVID-19 Vaccine (4 - Moderna series) 04/05/2020   OPHTHALMOLOGY EXAM  05/07/2021   Diabetic kidney evaluation - Urine ACR  11/02/2021   HEMOGLOBIN A1C  02/03/2022    Colorectal cancer screening: Type of screening: Colonoscopy. Completed 11/07/19. Repeat every 10 years  Lung Cancer Screening: (Low Dose CT Chest recommended if Age 66-80years, 30 pack-year currently smoking OR have quit w/in 15years.) does not qualify.   Additional Screening:  Hepatitis C Screening: does qualify; Completed education provided  Vision  Screening: Recommended annual ophthalmology exams for early detection of glaucoma and other disorders of the eye. Is the patient up to date with their annual eye exam?  No  Who is the provider or what is the name of the office in which the patient attends annual [facility] exams[facility] is not established with a provider, would they like to be referred to a provider to establish care? No .   Dental Screening: Recommended annual dental exams for proper oral hygiene  Community Resource Referral / Chronic Care Management: CRR required this visit?  No   CCM required this visit?  No      Plan:     I have personally reviewed and noted the following in  the patient's chart:   Medical and social history Use of alcohol, tobacco or illicit drugs  Current medications and supplements including opioid prescriptions. Patient is not currently taking opioid prescriptions. Functional ability and status Nutritional status Physical activity Advanced directives List of other physicians Hospitalizations, surgeries, and ER visits in previous 12 months Vitals Screenings to include cognitive, depression, and falls Referrals and appointments  In addition, I have reviewed and discussed with patient certain preventive protocols, quality metrics, and best practice recommendations. A written personalized care plan for preventive services as well as general preventive health recommendations were provided to patient.     Michiel Cowboy, RN   02/03/2022   Nurse Notes:  [name] , Thank you for taking time to come for your Medicare Wellness Visit. I appreciate your ongoing commitment to your health goals. Please review the following plan we discussed and let me know if I can assist you in the future.   These are the goals we discussed:  Goals      Patient Stated     Increase my physical activity by walking routinely        This is a list of the screening recommended for you and due  dates:  Health Maintenance  Topic Date Due   Complete foot exam   Never done   Hepatitis C Screening: USPSTF Recommendation to screen - Ages 1-79 yo.  Never done   Zoster (Shingles) Vaccine (1 of 2) Never done   COVID-19 Vaccine (4 - Moderna series) 04/05/2020   Eye exam for diabetics  05/07/2021   Yearly kidney health urinalysis for diabetes  11/02/2021   Hemoglobin A1C  02/03/2022   Yearly kidney function blood test for diabetes  09/17/2022   Medicare Annual Wellness Visit  02/04/2023   Colon Cancer Screening  11/06/2029   Tetanus Vaccine  06/11/2031   Pneumonia Vaccine  Completed   Flu Shot  Completed   HPV Vaccine  Aged Out

## 2022-02-01 NOTE — Patient Instructions (Signed)
Health Maintenance, Male Adopting a healthy lifestyle and getting preventive care are important in promoting health and wellness. Ask your health care provider about: The right schedule for you to have regular tests and exams. Things you can do on your own to prevent diseases and keep yourself healthy. What should I know about diet, weight, and exercise? Eat a healthy diet  Eat a diet that includes plenty of vegetables, fruits, low-fat dairy products, and lean protein. Do not eat a lot of foods that are high in solid fats, added sugars, or sodium. Maintain a healthy weight Body mass index (BMI) is a measurement that can be used to identify possible weight problems. It estimates body fat based on height and weight. Your health care provider can help determine your BMI and help you achieve or maintain a healthy weight. Get regular exercise Get regular exercise. This is one of the most important things you can do for your health. Most adults should: Exercise for at least 150 minutes each week. The exercise should increase your heart rate and make you sweat (moderate-intensity exercise). Do strengthening exercises at least twice a week. This is in addition to the moderate-intensity exercise. Spend less time sitting. Even light physical activity can be beneficial. Watch cholesterol and blood lipids Have your blood tested for lipids and cholesterol at 66 years of age, then have this test every 5 years. You may need to have your cholesterol levels checked more often if: Your lipid or cholesterol levels are high. You are older than 66 years of age. You are at high risk for heart disease. What should I know about cancer screening? Many types of cancers can be detected early and may often be prevented. Depending on your health history and family history, you may need to have cancer screening at various ages. This may include screening for: Colorectal cancer. Prostate cancer. Skin cancer. Lung  cancer. What should I know about heart disease, diabetes, and high blood pressure? Blood pressure and heart disease High blood pressure causes heart disease and increases the risk of stroke. This is more likely to develop in people who have high blood pressure readings or are overweight. Talk with your health care provider about your target blood pressure readings. Have your blood pressure checked: Every 3-5 years if you are 18-39 years of age. Every year if you are 40 years old or older. If you are between the ages of 65 and 75 and are a current or former smoker, ask your health care provider if you should have a one-time screening for abdominal aortic aneurysm (AAA). Diabetes Have regular diabetes screenings. This checks your fasting blood sugar level. Have the screening done: Once every three years after age 45 if you are at a normal weight and have a low risk for diabetes. More often and at a younger age if you are overweight or have a high risk for diabetes. What should I know about preventing infection? Hepatitis B If you have a higher risk for hepatitis B, you should be screened for this virus. Talk with your health care provider to find out if you are at risk for hepatitis B infection. Hepatitis C Blood testing is recommended for: Everyone born from 1945 through 1965. Anyone with known risk factors for hepatitis C. Sexually transmitted infections (STIs) You should be screened each year for STIs, including gonorrhea and chlamydia, if: You are sexually active and are younger than 66 years of age. You are older than 66 years of age and your   health care provider tells you that you are at risk for this type of infection. Your sexual activity has changed since you were last screened, and you are at increased risk for chlamydia or gonorrhea. Ask your health care provider if you are at risk. Ask your health care provider about whether you are at high risk for HIV. Your health care provider  may recommend a prescription medicine to help prevent HIV infection. If you choose to take medicine to prevent HIV, you should first get tested for HIV. You should then be tested every 3 months for as long as you are taking the medicine. Follow these instructions at home: Alcohol use Do not drink alcohol if your health care provider tells you not to drink. If you drink alcohol: Limit how much you have to 0-2 drinks a day. Know how much alcohol is in your drink. In the [place] one drink equals one 12 oz bottle of beer (355 mL), one 5 oz glass of Joanette Silveria (148 mL), or one 1 oz glass of hard liquor (44 mL). Lifestyle Do not use any products that contain nicotine or tobacco. These products include cigarettes, chewing tobacco, and vaping devices, such as e-cigarettes. If you need help quitting, ask your health care provider. Do not use street drugs. Do not share needles. Ask your health care provider for help if you need support or information about quitting drugs. General instructions Schedule regular health, dental, and eye exams. Stay current with your vaccines. Tell your health care provider if: You often feel depressed. You have ever been abused or do not feel safe at home. Summary Adopting a healthy lifestyle and getting preventive care are important in promoting health and wellness. Follow your health care provider's instructions about healthy diet, exercising, and getting tested or screened for diseases. Follow your health care provider's instructions on monitoring your cholesterol and blood pressure. This information is not intended to replace advice given to you by your health care provider. Make sure you discuss any questions you have with your health care provider. Document Revised: 08/02/2020 Document Reviewed: 08/02/2020 Elsevier Patient Education  2023 [employer]  

## 2022-02-03 ENCOUNTER — Ambulatory Visit (INDEPENDENT_AMBULATORY_CARE_PROVIDER_SITE_OTHER): Payer: Medicare HMO | Admitting: *Deleted

## 2022-02-03 ENCOUNTER — Encounter: Payer: Self-pay | Admitting: *Deleted

## 2022-02-03 DIAGNOSIS — Z0000 Encounter for general adult medical examination without abnormal findings: Secondary | ICD-10-CM | POA: Diagnosis not present

## 2022-02-03 NOTE — Addendum Note (Signed)
Addended by: Emelia Loron A on: 02/03/2022 11:38 AM   Modules accepted: Level of Service

## 2022-02-07 ENCOUNTER — Encounter: Payer: Self-pay | Admitting: Emergency Medicine

## 2022-02-07 ENCOUNTER — Ambulatory Visit (INDEPENDENT_AMBULATORY_CARE_PROVIDER_SITE_OTHER): Payer: Medicare HMO | Admitting: Emergency Medicine

## 2022-02-07 VITALS — BP 128/82 | HR 60 | Temp 98.1°F | Ht 66.0 in | Wt 194.4 lb

## 2022-02-07 DIAGNOSIS — I152 Hypertension secondary to endocrine disorders: Secondary | ICD-10-CM | POA: Diagnosis not present

## 2022-02-07 DIAGNOSIS — I2510 Atherosclerotic heart disease of native coronary artery without angina pectoris: Secondary | ICD-10-CM | POA: Diagnosis not present

## 2022-02-07 DIAGNOSIS — N182 Chronic kidney disease, stage 2 (mild): Secondary | ICD-10-CM

## 2022-02-07 DIAGNOSIS — E1159 Type 2 diabetes mellitus with other circulatory complications: Secondary | ICD-10-CM | POA: Diagnosis not present

## 2022-02-07 DIAGNOSIS — K3184 Gastroparesis: Secondary | ICD-10-CM

## 2022-02-07 DIAGNOSIS — E1169 Type 2 diabetes mellitus with other specified complication: Secondary | ICD-10-CM | POA: Diagnosis not present

## 2022-02-07 DIAGNOSIS — K2100 Gastro-esophageal reflux disease with esophagitis, without bleeding: Secondary | ICD-10-CM

## 2022-02-07 DIAGNOSIS — E785 Hyperlipidemia, unspecified: Secondary | ICD-10-CM

## 2022-02-07 DIAGNOSIS — E1143 Type 2 diabetes mellitus with diabetic autonomic (poly)neuropathy: Secondary | ICD-10-CM

## 2022-02-07 LAB — POCT GLYCOSYLATED HEMOGLOBIN (HGB A1C): Hemoglobin A1C: 6.3 % — AB (ref 4.0–5.6)

## 2022-02-07 NOTE — Patient Instructions (Signed)
Health Maintenance After Age 65 After age 65, you are at a higher risk for certain long-term diseases and infections as well as injuries from falls. Falls are a major cause of broken bones and head injuries in people who are older than age 65. Getting regular preventive care can help to keep you healthy and well. Preventive care includes getting regular testing and making lifestyle changes as recommended by your health care provider. Talk with your health care provider about: Which screenings and tests you should have. A screening is a test that checks for a disease when you have no symptoms. A diet and exercise plan that is right for you. What should I know about screenings and tests to prevent falls? Screening and testing are the best ways to find a health problem early. Early diagnosis and treatment give you the best chance of managing medical conditions that are common after age 65. Certain conditions and lifestyle choices may make you more likely to have a fall. Your health care provider may recommend: Regular vision checks. Poor vision and conditions such as cataracts can make you more likely to have a fall. If you wear glasses, make sure to get your prescription updated if your vision changes. Medicine review. Work with your health care provider to regularly review all of the medicines you are taking, including over-the-counter medicines. Ask your health care provider about any side effects that may make you more likely to have a fall. Tell your health care provider if any medicines that you take make you feel dizzy or sleepy. Strength and balance checks. Your health care provider may recommend certain tests to check your strength and balance while standing, walking, or changing positions. Foot health exam. Foot pain and numbness, as well as not wearing proper footwear, can make you more likely to have a fall. Screenings, including: Osteoporosis screening. Osteoporosis is a condition that causes  the bones to get weaker and break more easily. Blood pressure screening. Blood pressure changes and medicines to control blood pressure can make you feel dizzy. Depression screening. You may be more likely to have a fall if you have a fear of falling, feel depressed, or feel unable to do activities that you used to do. Alcohol use screening. Using too much alcohol can affect your balance and may make you more likely to have a fall. Follow these instructions at home: Lifestyle Do not drink alcohol if: Your health care provider tells you not to drink. If you drink alcohol: Limit how much you have to: 0-1 drink a day for women. 0-2 drinks a day for men. Know how much alcohol is in your drink. In the [place] one drink equals one 12 oz bottle of beer (355 mL), one 5 oz glass of wine (148 mL), or one 1 oz glass of hard liquor (44 mL). Do not use any products that contain nicotine or tobacco. These products include cigarettes, chewing tobacco, and vaping devices, such as e-cigarettes. If you need help quitting, ask your health care provider. Activity  Follow a regular exercise program to stay fit. This will help you maintain your balance. Ask your health care provider what types of exercise are appropriate for you. If you need a cane or walker, use it as recommended by your health care provider. Wear supportive shoes that have nonskid soles. Safety  Remove any tripping hazards, such as rugs, cords, and clutter. Install safety equipment such as grab bars in bathrooms and safety rails on stairs. Keep rooms and walkways   well-lit. General instructions Talk with your health care provider about your risks for falling. Tell your health care provider if: You fall. Be sure to tell your health care provider about all falls, even ones that seem minor. You feel dizzy, tiredness (fatigue), or off-balance. Take over-the-counter and prescription medicines only as told by your health care provider. These include  supplements. Eat a healthy diet and maintain a healthy weight. A healthy diet includes low-fat dairy products, low-fat (lean) meats, and fiber from whole grains, beans, and lots of fruits and vegetables. Stay current with your vaccines. Schedule regular health, dental, and eye exams. Summary Having a healthy lifestyle and getting preventive care can help to protect your health and wellness after age 65. Screening and testing are the best way to find a health problem early and help you avoid having a fall. Early diagnosis and treatment give you the best chance for managing medical conditions that are more common for people who are older than age 65. Falls are a major cause of broken bones and head injuries in people who are older than age 65. Take precautions to prevent a fall at home. Work with your health care provider to learn what changes you can make to improve your health and wellness and to prevent falls. This information is not intended to replace advice given to you by your health care provider. Make sure you discuss any questions you have with your health care provider. Document Revised: 08/02/2020 Document Reviewed: 08/02/2020 [employer] Patient Education  2023 [employer]  

## 2022-02-07 NOTE — Assessment & Plan Note (Signed)
Well-controlled hypertension. Continue metoprolol tartrate 25 mg twice a day, lisinopril 40 mg daily Well-controlled diabetes with hemoglobin A1c at 6.3. Continue Farxiga 10 mg daily, Premeal NovoLog insulin and daily Tresiba 50 units Cardiovascular risk associated with hypertension and diabetes discussed. Diet and nutrition discussed. Follow-up in 6 months.

## 2022-02-07 NOTE — Assessment & Plan Note (Addendum)
Stable.  No recent anginal episodes.  Continue beta-blocker with metoprolol tartrate 25 mg twice a day and daily baby aspirin. Continue fenofibrate 160 mg daily and pravastatin 40 mg daily. Follows up with cardiologist on a regular basis.

## 2022-02-07 NOTE — Assessment & Plan Note (Signed)
Stable.  Takes Reglan 10 mg every 6 hours as needed

## 2022-02-07 NOTE — Assessment & Plan Note (Signed)
Stable and asymptomatic at present time.  Takes omeprazole 40 mg daily.

## 2022-02-07 NOTE — Progress Notes (Signed)
Joseph [name] 66 y.o.   Chief Complaint  Patient presents with   Follow-up    45mth f/u appt , no concerns    HISTORY OF PRESENT ILLNESS: This is a 66y.o. male here for 665-monthollow-up of chronic medical problems. Has no complaints or medical concerns today.  HPI   Prior to Admission medications   Medication Sig Start Date End Date Taking? Authorizing Provider  aspirin EC 81 MG tablet Take 81 mg by mouth daily.    [provider]  B-D UF III MINI PEN NEEDLES 31G X 5 MM MISC Inject into the skin 3 (three) times daily. 12/06/20   [provider]  B-D UF III MINI PEN NEEDLES 31G X 5 MM MISC CHECK THREE TIMES DAILY DX E11.9 12/22/21   SaHorald PollenMD  budesonide-formoterol (SP[facility] Surgery160-4.5 MCG/ACT inhaler Inhale 2 puffs into the lungs 2 (two) times daily as needed (asthma).    [provider]  cholecalciferol (VITAMIN D) 1000 UNITS tablet Take 1,000 Units by mouth daily.    [provider]  cyclobenzaprine (FLEXERIL) 10 MG tablet TAKE 1 TABLET BY MOUTH THREE TIMES A DAY AS NEEDED FOR MUSCLE SPASMS 12/26/21   GuWillia CrazeNP  DULoxetine (CYMBALTA) 30 MG capsule TAKE 1 CAPSULE BY MOUTH EVERY DAY 11/23/21   SaHorald PollenMD  ergocalciferol (VITAMIN D2) 1.25 MG (50000 UT) capsule Take 50,000 Units by mouth every Wednesday. 04/09/20   [provider]  famotidine (PEPCID) 40 MG tablet TAKE 1 TABLET BY MOUTH EVERYDAY AT BEDTIME 09/22/21   GeGatha MayerMD  FARXIGA 10 MG TABS tablet Take 10 mg by mouth daily. 12/14/20   [provider]  fenofibrate 160 MG tablet Take 160 mg by mouth daily. 10/28/20   [provider]  ferrous sulfate 325 (65 FE) MG tablet Take 325 mg by mouth daily with breakfast.    [provider]  furosemide (LASIX) 20 MG tablet TAKE 1 TABLET (20 MG TOTAL) BY MOUTH DAILY. PLEASE CONTACT OFFICE FOR APPOINTMENT NEED OV. 05/01/17   [name], KeNadean CorwinMD  glucose blood (ONETOUCH VERIO)  test strip CHECK SUGARS 4 TIMES A DAY DX E11.9 11/26/20   [provider]  Insulin Pen Needle (B-D UF III MINI PEN NEEDLES) 31G X 5 MM MISC CHECK THREE TIMES DAILY DX E11.9 05/10/20   [provider]  Insulin Pen Needle (B-D UF III MINI PEN NEEDLES) 31G X 5 MM MISC CHECK THREE TIMES DAILY DX E11.9 05/10/20   [provider]  Insulin Syringe-Needle U-100 31G X 5/16" 0.3 ML MISC CHECK THREE TIMES DAILY DX E11.9 05/10/20   [provider]  Lancets (ONETOUCH DELICA PLUS [id number] topically. 12/10/20   [provider]  levocetirizine (XYZAL) 5 MG tablet Take 5 mg by mouth every evening. 10/05/20   [provider]  lisinopril (PRINIVIL,ZESTRIL) 20 MG tablet TAKE 1 TABLET (20 MG TOTAL) BY MOUTH DAILY. Patient taking differently: Take 20 mg by mouth in the morning and at bedtime. 04/20/14   [name], KeNadean CorwinMD  metoCLOPramide (REGLAN) 10 MG tablet TAKE 1 TABLET (10 MG TOTAL) BY MOUTH EVERY 6 (SIX) HOURS AS NEEDED. FOR NAUSEA 12/18/21   SaHorald PollenMD  metoprolol tartrate (LOPRESSOR) 25 MG tablet TAKE 1 TABLET BY MOUTH TWICE A DAY 11/23/21   SaHorald PollenMD  mupirocin ointment (BACTROBAN) 2 % Apply 1 Application topically 2 (two) times daily. 12/22/21   McCriselda Peaches  DPM  nitroGLYCERIN (NITROSTAT) 0.4 MG SL tablet Place 1 tablet (0.4 mg total) under the tongue every 5 (five) minutes as needed for chest pain. 08/10/20   [name], Nadean Corwin, MD  NOVOLOG FLEXPEN 100 UNIT/ML FlexPen Inject 10-16 Units into the skin 3 (three) times daily. 12/27/18   [provider]  omeprazole (PRILOSEC) 40 MG capsule TAKE 1 CAPSULE BY MOUTH EVERY DAY 11/23/21   Horald Pollen, MD  [facility] VERIO test strip 4 (four) times daily. 11/26/20   [provider]  pravastatin (PRAVACHOL) 40 MG tablet TAKE 1 TABLET BY MOUTH DAILY. PLEASE KEEP UPCOMING APPT IN JANUARY 2023 12/19/21   [name], Nadean Corwin, MD  TRESIBA FLEXTOUCH 100 UNIT/ML FlexTouch  Pen INJECT 0.5 ML (50 UNITS TOTAL) UNDER THE SKIN DAILY. 01/17/22   Horald Pollen, MD    Allergies  Allergen Reactions   Metformin Diarrhea        Robaxin [Methocarbamol] Itching and Rash    Patient Active Problem List   Diagnosis Date Noted   Dyslipidemia associated with type 2 diabetes mellitus ([place]) 08/03/2021   Depression 02/01/2021   Gastro-esophageal reflux disease with esophagitis 02/01/2021   Gastric polyps - hyperplastic 03/19/2019   Diabetic autonomic neuropathy ([place]) 03/19/2019   Benign prostatic hyperplasia 01/16/2019   Chronic low back pain 01/16/2019   Iron deficiency anemia 01/16/2019   Dyslipidemia 03/10/2016   Atherosclerotic heart disease of native coronary artery without angina pectoris 05/03/2015   Diabetic peripheral neuropathy associated with type 2 diabetes mellitus ([place]) 05/03/2015   Right leg injury, sequela 05/03/2015   Enlarged prostate without lower urinary tract symptoms (luts) 04/30/2015   Major depressive disorder, recurrent, mild ([place]) 04/30/2015   Peripheral vascular disease ([place]) 04/30/2015   Hx of adenomatous colonic polyps 06/11/2014   Hyperlipidemia 02/25/2014   Diabetic gastroparesis ([place]) 02/17/2014   Neuropathy 12/03/2013   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) 07/27/2007   GERD 07/27/2007   ARTHRITIS 07/27/2007   NEPHROLITHIASIS, HX OF 07/27/2007    Past Medical History:  Diagnosis Date   Anemia    Anxiety    Arthritis    BPH (benign prostatic hypertrophy)    Cataract    Chronic renal insufficiency, stage III (moderate) ([place])    [place] kidney   Coronary artery disease    Dr Debara Pickett    Depression    Diabetes mellitus without complication ([place]) 2440   insulin dependent    Diabetic autonomic neuropathy ([place]) 03/19/2019   Gastric polyps - hyperplastic 03/19/2019   Gastroparesis due to DM (H[facility])    GERD (gastroesophageal reflux disease)    Hx of adenomatous colonic polyps 06/11/2014   Hyperlipidemia    Hypertension    Myocardial infarction ([place])    Neuropathy    PVD (peripheral vascular disease) ([place])    Sleep apnea 2010   refuses to use cpap    Past Surgical History:  Procedure Laterality Date   ANKLE FRACTURE SURGERY Right    CARDIAC CATHETERIZATION  01/30/2012   This demonstrated proximal to mid LAD disease and disease of 2 large OM vessels concerning for surgical disease.   COLONOSCOPY     CORONARY ARTERY BYPASS GRAFT  02/06/2012   Procedure: CORONARY ARTERY BYPASS GRAFTING (CABG); 5- Vessel bypass a LIMA to the LAD,  a saphenous vein to the to the diagonal , a saphenous vein to the distal OM, and sequential reverse saphenous vien graft to the posterior descending and 2nd OM. Surgeon: Grace Isaac, MD;  Location: [facility];  Service: Open Heart Surgery;  Laterality: [name];   ESOPHAGOGASTRODUODENOSCOPY  2005   LEFT HEART CATHETERIZATION WITH CORONARY ANGIOGRAM [name] 01/30/2012   Procedure: LEFT HEART CATHETERIZATION WITH CORONARY ANGIOGRAM;  Surgeon: Pixie Casino, MD;  Location[facility] CATH LAB;  Service: Cardiovascular;  Laterality: [name];   teeth removal  08/16/2021   TOOTH EXTRACTION [name] 09/16/2021   Procedure: DENTAL RESTORATION/EXTRACTIONS;  Surgeon: Diona Browner, DMD;  Location: [place];  Service: Oral Surgery;  Laterality: [name];   UPPER GASTROINTESTINAL ENDOSCOPY      Social History   Socioeconomic History   Marital status: Widowed    Spouse name: Not on file   Number of children: 4   Years of education: Not on file   Highest education level: Not on file  Occupational History   Occupation: disabled  Tobacco Use   Smoking status: Never   Smokeless tobacco: Never  Vaping Use   Vaping Use: Never used  Substance and Sexual Activity   Alcohol  use: No   Drug use: No   Sexual activity: Not Currently  Other Topics Concern   Not on file  Social History Narrative   Widowed, 1 son and 4 daughters   Disabled and dual eligible Medicare and Medicaid   One caffeinated beverage daily   Social Determinants of Health   Financial Resource Strain: Low Risk  (02/03/2022)   Overall Financial Resource Strain (CARDIA)    Difficulty of Paying Living Expenses: Not hard at all  Food Insecurity: No Food Insecurity (02/03/2022)   Hunger Vital Sign    Worried About Running Out of Food in the Last Year: Never true    [place] in the Last Year: Never true  Transportation Needs: No Transportation Needs (02/03/2022)   PRAPARE - Hydrologist (Medical): No    Lack of Transportation (Non-Medical): No  Physical Activity: Insufficiently Active (02/03/2022)   Exercise Vital Sign    Days of Exercise per Week: 3 days    Minutes of Exercise per Session: 30 min  Stress: No Stress Concern Present (02/03/2022)   [place]    Feeling of Stress : Not at all  Social Connections: Socially Isolated (02/03/2022)   Social Connection and Isolation Panel [NHANES]    Frequency of Communication with Friends and Family: More than three times a week    Frequency of Social Gatherings with Friends and Family: More than three times a week    Attends Religious Services: Never    Marine scientist or Organizations: No    Attends Archivist Meetings: Never    Marital Status: Widowed  Intimate Partner Violence: Not At Risk (02/03/2022)   Humiliation, Afraid, Rape, and Kick questionnaire    Fear of Current or Ex-Partner: No    Emotionally Abused: No    Physically Abused: No    Sexually Abused: No    Family History  Problem Relation Age of Onset   Diabetes Father    Hypertension Father    Cancer Sister        unknown   Colon cancer Neg Hx     Esophageal cancer Neg Hx    Rectal cancer Neg Hx    Stomach cancer  Neg Hx    Pancreatic cancer Neg Hx      Review of Systems  Constitutional: Negative.  Negative for chills and fever.  HENT: Negative.  Negative for congestion and sore throat.   Respiratory: Negative.  Negative for cough and shortness of breath.   Cardiovascular: Negative.  Negative for chest pain and palpitations.  Gastrointestinal:  Negative for abdominal pain, diarrhea, nausea and vomiting.  Genitourinary: Negative.  Negative for dysuria and hematuria.  Skin: Negative.  Negative for rash.  Neurological: Negative.  Negative for dizziness and headaches.  All other systems reviewed and are negative.  Today's Vitals   02/07/22 1311  BP: 128/82  Pulse: 60  Temp: 98.1 F (36.7 C)  TempSrc: Oral  SpO2: 95%  Weight: 194 lb 6 oz (88.2 kg)  Height: '5\' 6"'$  (1.676 m)   Body mass index is 31.37 kg/m. Wt Readings from Last 3 Encounters:  02/07/22 194 lb 6 oz (88.2 kg)  12/26/21 196 lb 3.2 oz (89 kg)  09/16/21 202 lb (91.6 kg)     Physical Exam Vitals reviewed.  Constitutional:      Appearance: Normal appearance.  HENT:     Head: Normocephalic.  Eyes:     Extraocular Movements: Extraocular movements intact.     Pupils: Pupils are equal, round, and reactive to light.  Cardiovascular:     Rate and Rhythm: Normal rate and regular rhythm.     Pulses: Normal pulses.     Heart sounds: Normal heart sounds.  Pulmonary:     Effort: Pulmonary effort is normal.     Breath sounds: Normal breath sounds.  Abdominal:     Palpations: Abdomen is soft.     Tenderness: There is no abdominal tenderness.  Musculoskeletal:        General: Normal range of motion.     Cervical back: No tenderness.  Lymphadenopathy:     Cervical: No cervical adenopathy.  Skin:    General: Skin is warm and dry.  Neurological:     General: No focal deficit present.     Mental Status: He is alert and oriented to person, place, and time.   Psychiatric:        Mood and Affect: Mood normal.        Behavior: Behavior normal.     Results for orders placed or performed in visit on 02/07/22 (from the past 24 hour(s))  POCT HgB A1C     Status: Abnormal   Collection Time: 02/07/22  1:26 PM  Result Value Ref Range   Hemoglobin A1C 6.3 (A) 4.0 - 5.6 %   HbA1c POC (<> result, manual entry)     HbA1c, POC (prediabetic range)     HbA1c, POC (controlled diabetic range)      ASSESSMENT & PLAN: A total of 46 minutes was spent with the patient and counseling/coordination of care regarding preparing for this visit, review of most recent office visit notes, review of multiple chronic medical conditions and their management, review of all medications, review of most recent blood work results,.  Cardiovascular risks associated with hypertension and diabetes, education on nutrition, prognosis, documentation, and need for follow  Problem List Items Addressed This Visit       Cardiovascular and Mediastinum   Hypertension associated with diabetes ([place]) - Primary    Well-controlled hypertension. Continue metoprolol tartrate 25 mg twice a day, lisinopril 40 mg daily Well-controlled diabetes with hemoglobin A1c at 6.3. Continue Farxiga 10 mg daily, Premeal NovoLog insulin and daily  Tresiba 50 units Cardiovascular risk associated with hypertension and diabetes discussed. Diet and nutrition discussed. Follow-up in 6 months.      Relevant Orders   POCT HgB A1C (Completed)   Atherosclerotic heart disease of native coronary artery without angina pectoris    Stable.  No recent anginal episodes.  Continue beta-blocker with metoprolol tartrate 25 mg twice a day and daily baby aspirin. Continue fenofibrate 160 mg daily and pravastatin 40 mg daily. Follows up with cardiologist on a regular basis.        Digestive   Diabetic gastroparesis ([place])    Stable.  Takes Reglan 10 mg every 6 hours as needed      Gastro-esophageal reflux disease with  esophagitis    Stable and asymptomatic at present time.  Takes omeprazole 40 mg daily.        Endocrine   Dyslipidemia associated with type 2 diabetes mellitus ([place])    Stable.  Diet and nutrition discussed.  Continue pravastatin 40 mg and fenofibrate 160 mg daily.        Genitourinary   Chronic kidney disease, stage 2 (mild)    Advised to stay well-hydrated and avoid NSAIDs. Continue Farxiga 10 mg daily      Patient Instructions  Health Maintenance After Age 54 After age 12, you are at a higher risk for certain long-term diseases and infections as well as injuries from falls. Falls are a major cause of broken bones and head injuries in people who are older than age 27. Getting regular preventive care can help to keep you healthy and well. Preventive care includes getting regular testing and making lifestyle changes as recommended by your health care provider. Talk with your health care provider about: Which screenings and tests you should have. A screening is a test that checks for a disease when you have no symptoms. A diet and exercise plan that is right for you. What should I know about screenings and tests to prevent falls? Screening and testing are the best ways to find a health problem early. Early diagnosis and treatment give you the best chance of managing medical conditions that are common after age 22. Certain conditions and lifestyle choices may make you more likely to have a fall. Your health care provider may recommend: Regular vision checks. Poor vision and conditions such as cataracts can make you more likely to have a fall. If you wear glasses, make sure to get your prescription updated if your vision changes. Medicine review. Work with your health care provider to regularly review all of the medicines you are taking, including over-the-counter medicines. Ask your health care provider about any side effects that may make you more likely to have a fall. Tell your health care  provider if any medicines that you take make you feel dizzy or sleepy. Strength and balance checks. Your health care provider may recommend certain tests to check your strength and balance while standing, walking, or changing positions. Foot health exam. Foot pain and numbness, as well as not wearing proper footwear, can make you more likely to have a fall. Screenings, including: Osteoporosis screening. Osteoporosis is a condition that causes the bones to get weaker and break more easily. Blood pressure screening. Blood pressure changes and medicines to control blood pressure can make you feel dizzy. Depression screening. You may be more likely to have a fall if you have a fear of falling, feel depressed, or feel unable to do activities that you used to do. Alcohol use screening.  Using too much alcohol can affect your balance and may make you more likely to have a fall. Follow these instructions at home: Lifestyle Do not drink alcohol if: Your health care provider tells you not to drink. If you drink alcohol: Limit how much you have to: 0-1 drink a day for women. 0-2 drinks a day for men. Know how much alcohol is in your drink. In the [place] one drink equals one 12 oz bottle of beer (355 mL), one 5 oz glass of wine (148 mL), or one 1 oz glass of hard liquor (44 mL). Do not use any products that contain nicotine or tobacco. These products include cigarettes, chewing tobacco, and vaping devices, such as e-cigarettes. If you need help quitting, ask your health care provider. Activity  Follow a regular exercise program to stay fit. This will help you maintain your balance. Ask your health care provider what types of exercise are appropriate for you. If you need a cane or walker, use it as recommended by your health care provider. Wear supportive shoes that have nonskid soles. Safety  Remove any tripping hazards, such as rugs, cords, and clutter. Install safety equipment such as grab bars in  bathrooms and safety rails on stairs. Keep rooms and walkways well-lit. General instructions Talk with your health care provider about your risks for falling. Tell your health care provider if: You fall. Be sure to tell your health care provider about all falls, even ones that seem minor. You feel dizzy, tiredness (fatigue), or off-balance. Take over-the-counter and prescription medicines only as told by your health care provider. These include supplements. Eat a healthy diet and maintain a healthy weight. A healthy diet includes low-fat dairy products, low-fat (lean) meats, and fiber from whole grains, beans, and lots of fruits and vegetables. Stay current with your vaccines. Schedule regular health, dental, and eye exams. Summary Having a healthy lifestyle and getting preventive care can help to protect your health and wellness after age 40. Screening and testing are the best way to find a health problem early and help you avoid having a fall. Early diagnosis and treatment give you the best chance for managing medical conditions that are more common for people who are older than age 42. Falls are a major cause of broken bones and head injuries in people who are older than age 38. Take precautions to prevent a fall at home. Work with your health care provider to learn what changes you can make to improve your health and wellness and to prevent falls. This information is not intended to replace advice given to you by your health care provider. Make sure you discuss any questions you have with your health care provider. Document Revised: 08/02/2020 Document Reviewed: 08/02/2020 Elsevier Patient Education  [place], MD [facility]

## 2022-02-07 NOTE — Assessment & Plan Note (Signed)
Advised to stay well-hydrated and avoid NSAIDs. Continue Farxiga 10 mg daily. 

## 2022-02-07 NOTE — Assessment & Plan Note (Signed)
Stable.  Diet and nutrition discussed.  Continue pravastatin 40 mg and fenofibrate 160 mg daily.

## 2022-02-19 ENCOUNTER — Other Ambulatory Visit: Payer: Self-pay | Admitting: Emergency Medicine

## 2022-02-20 MED ORDER — METOCLOPRAMIDE HCL 10 MG PO TABS: 10.0000 mg | ORAL_TABLET | Freq: Four times a day (QID) | ORAL | 0 refills | Status: DC | PRN

## 2022-03-04 ENCOUNTER — Other Ambulatory Visit: Payer: Self-pay | Admitting: Emergency Medicine

## 2022-03-14 ENCOUNTER — Ambulatory Visit (INDEPENDENT_AMBULATORY_CARE_PROVIDER_SITE_OTHER): Payer: Medicare HMO | Admitting: Emergency Medicine

## 2022-03-14 ENCOUNTER — Encounter: Payer: Self-pay | Admitting: Emergency Medicine

## 2022-03-14 VITALS — BP 126/82 | HR 65 | Temp 98.4°F | Ht 66.0 in | Wt 194.2 lb

## 2022-03-14 DIAGNOSIS — F4321 Adjustment disorder with depressed mood: Secondary | ICD-10-CM | POA: Insufficient documentation

## 2022-03-14 DIAGNOSIS — F4323 Adjustment disorder with mixed anxiety and depressed mood: Secondary | ICD-10-CM | POA: Diagnosis not present

## 2022-03-14 MED ORDER — ALPRAZOLAM 0.5 MG PO TABS: 0.5000 mg | ORAL_TABLET | Freq: Two times a day (BID) | ORAL | 1 refills | Status: DC | PRN

## 2022-03-14 NOTE — Patient Instructions (Signed)
Control de la [employer] adultos Managing Anxiety, Adult Despus de haber recibido un diagnstico de ansiedad, es posible que se sienta aliviado al saber por qu se haba sentido o haba actuado de cierto modo. Es posible que tambin se sienta abrumado por el tratamiento que tiene por delante y por lo que este significar para su vida. Con atencin y 62, usted puede controlar esta afeccin. Cmo manejar los cambios en el estilo de vida Control del estrs y la ansiedad  El estrs es la reaccin del cuerpo ante los cambios y los acontecimientos de la vida, tanto bu[place]. [employer] de los episodios de estrs duran slo algunas horas, pero el estrs puede ser continuo y Forensic psychologist a ms que solo estrs. Aunque el estrs puede desempear un papel importante en la ansiedad, no es lo mismo que la ansiedad. El estrs generalmente es causado por algo externo, como una fecha lmite, una prueba o una competencia. El estrs normalmente pasa despus de que el evento desencadenante ha terminado.  La ansiedad es causada por algo interno, por ejemplo, imaginar un resultado terrible o preocuparse porque algo ir mal y lo devastar. A menudo, la ansiedad no desaparece incluso despus de que el evento desencadenante ha finalizado y puede tornarse en una preocupacin a largo plazo (crnica). Es importante comprender las diferencias entre el estrs y la ansiedad, y Chief Technology Officer el estrs de manera efectiva para que no genere una respuesta de ansiedad. Hable con el mdico o un consejero para obtener ms informacin sobre cmo reducir la ansiedad y Dealer. Es posible que el profesional sugiera tcnicas para reducir la tensin, tales como: Musicoterapia. Pase tiempo creando o escuchando msica que disfrute y lo inspire. Meditacin consciente. Practique el estar consciente de la respiracin normal sin intentar controlarla. Puede realizarse mientras est sentado o camina. Oracin centrante. Esto implica centrarse  en una palabra, frase o imagen sagrada que le signifique algo y le genere paz. Respiracin profunda. Para hacer esto, expanda el estmago e inhale lentamente por la nariz. [place] unos 3 a 5 segundos. Luego, exhale lentamente mientras deja que los msculos del estmago se relajen. Dilogo interno. Aprenda a notar e identificar patrones de pensamiento que conducen a reacciones de ansiedad y cambie esos patrones a pensamientos que se transmitan tranquilidad. Relajacin muscular. Dedique tiempo a tensar los msculos y [employer]. Elija una tcnica para reducir la tensin que se adapte a su estilo de vida y su personalidad. Estas tcnicas llevan tiempo y prctica. Resrvese de 5 a 15 minutos por da para Ambulance person. Algunos terapeutas pueden ofrecer orientacin y capacitacin en estas tcnicas. Es posible que algunos planes de seguros mdicos cubran la capacitacin. Otras cosas que puede hacer para controlar el estrs y la ansiedad incluyen: Catering manager un registro del estrs. Esto puede ayudarlo a identificar qu le desencadena su reaccin y, Air traffic controller, aprender las maneras de controlar su respuesta al [place]. Pensar en cmo reacciona ante ciertas situaciones. Es posible que no sea capaz de Chief Technology Officer todo, pero puede Environmental education officer. Hacerse tiempo para las actividades que lo ayudan a Nurse, children's y no sentir culpa por pasar su tiempo de [place]. Crear imgenes visuales. Esto implica imaginar o crear imgenes mentales para ayudarlo a relajarse. Practicar yoga. A travs del yoga, puede disminuir la tensin y Emergency planning/management officer.  Medicamentos Los medicamentos pueden ayudar a [employer]. Algunos medicamentos para la ansiedad: Antidepresivos. Por lo general, se recetan para el control diario a Barrister's clerk. Medicamentos  para la ansiedad. Estos se pueden agregar en casos graves, especialmente cuando ocurren crisis de [place]. Un mdico recetar los medicamentos. Cuando se  usan juntos, los medicamentos, la psicoterapia y las tcnicas de reduccin de la tensin pueden ser el tratamiento ms efectivo. [place] interpersonales pueden ser muy importantes para ayudar a su recuperacin. Intente pasar ms tiempo interactuando con amigos y familiares de [place]. Considere la posibilidad de ir a terapia de pareja, si tiene una pareja, tomar clases de educacin familiar o ir a Careers information officer. La terapia puede ayudarlos a usted y a los dems a comprender mejor su afeccin. Cmo Museum/gallery conservator en su ansiedad Cada persona responde de [place] diferente al tratamiento de la ansiedad. Se dice que est recuperado de la ansiedad cuando los sntomas disminuyen y dejan de Cabin crew en las actividades diarias en el hogar o [place]. Esto podra significar que comenzar a Field seismologist lo siguiente: Associate Professor y atencin. Tener menos interferencia de la preocupacin en el pensamiento diario. Dormir mejor. Estar menos irritable. Tener ms energa. Tener [employer]. Tambin es Glass blower/designer cuando su afeccin empeora. Comunquese con el mdico si sus sntomas interfieren en su hogar o su trabajo, y usted siente que su afeccin no est mejorando. Siga estas instrucciones en su casa: Actividad Actividad fsica. Los adultos deben hacer lo siguiente: Optometrist, al menos, 150 minutos de actividad fsica por semana. El ejercicio debe aumentar la frecuencia cardaca y Nature conservation officer transpirar (ejercicio de intensidad moderada). Realizar ejercicios de fortalecimiento por lo [employer] por semana. Dormir bien y por el tiempo adecuado. [employer] de los adultos necesitan entre 7 y 9 horas de sueo todas las noches. Estilo de vida  Siga una dieta saludable que incluya abundantes frutas, verduras, cereales integrales, productos lcteos descremados y protenas magras. No consuma muchos alimentos con alto contenido de grasas, azcares agregados o sal  (sodio). Opte por cosas que le simplifiquen la vida. No consuma ningn producto que contenga nicotina o tabaco. Estos productos incluyen cigarrillos, tabaco para Higher education careers adviser y aparatos de vapeo, como los Psychologist, sport and exercise. Si necesita ayuda para dejar de fumar, consulte al mdico. Evite el consumo de cafena, alcohol y ciertos medicamentos contra el resfro de venta sin receta. Estos podran Engineer, building services. Pregntele al farmacutico qu medicamentos no debera tomar. Instrucciones generales Use los medicamentos de venta libre y los recetados solamente como se lo haya indicado el mdico. Concurra a todas las visitas de seguimiento. Esto es importante. Dnde buscar apoyo Puede conseguir ayuda y [employer] siguientes lugares: Grupos de [place]. Organizaciones comunitarias y en lnea. Un lder espiritual de confianza. Terapia de pareja. Clases de educacin familiar. Terapia familiar. Dnde obtener ms informacin Formar parte de un grupo de apoyo podra resultarle til para enfrentar la ansiedad. Las siguientes fuentes pueden ayudarlo a Orthoptist consejeros o grupos de apoyo cerca de su hogar: [place] (Salud Mental de los [place]): www.mentalhealthamerica.net Anxiety and Depression Association of [place] [ADAA] (Asociacin de Ansiedad y Depresin de los [place]): https://www.clark.net/ National Alliance on Mental Illness [NAMI] ([place]): www.nami.org Comunquese con un mdico si: Le resulta difcil permanecer concentrado o finalizar las tareas diarias. Pasa muchas horas por da sintindose preocupado por la vida cotidiana. La preocupacin le provoca un cansancio extremo. Comienza a tener dolores de [place] o se siente tenso con frecuencia. Tiene nuseas crnicas y/o diarrea. Solicite ayuda de inmediato si: Se le acelera la frecuencia cardaca y Industrial/product designer. Piensa acerca de  lastimarse a usted misma o a Midwife. Si alguna vez siente que puede lastimarse o Market researcher a Producer, television/film/video, o tiene pensamientos de poner fin a su vida, busque ayuda de inmediato. Dirjase al servicio de urgencias ms cercano o: Comunquese con el servicio de emergencias de su localidad (911 en los [place]). Llame a una lnea de asistencia al suicida y atencin en crisis como National Suicide Prevention Lifeline ([place]) al [phone]. Est disponible las 24 horas del da en los [place] Enve un mensaje de texto a la lnea para casos de crisis al [phone] (en los [place] Resumen Tomar medidas para aprender y usar tcnicas de reduccin de la tensin pueden ayudarlo a calmarse y a Conservation officer, historic buildings reaccin de ansiedad. Cuando se usan juntos, los medicamentos, la psicoterapia y las tcnicas de reduccin de la tensin pueden ser el tratamiento ms efectivo. [employer], los amigos y las parejas pueden desempear un papel importante para brindarle apoyo. Esta informacin no tiene Marine scientist el consejo del mdico. Asegrese de hacerle al mdico cualquier pregunta que tenga. Document Revised: 07/22/2020 Document Reviewed: 07/22/2020 Elsevier Patient Education  [place]ie.

## 2022-03-14 NOTE — Assessment & Plan Note (Signed)
Exacerbated by recent loss of brother Continue duloxetine 30 mg daily Alprazolam 0.5 mg as needed.

## 2022-03-14 NOTE — Assessment & Plan Note (Signed)
Exacerbating anxiety. Continue Cymbalta 30 mg daily Start alprazolam 0.5 mg as needed

## 2022-03-14 NOTE — Progress Notes (Signed)
Joseph [name] 66 y.o.   Chief Complaint  Patient presents with   Follow-up    Hx anxiety, and depression, patient experiencing death in family this week. , patient having some memory issues     HISTORY OF PRESENT ILLNESS: This is a 66 y.o. male grieving recent loss of her brother 2 weeks ago. Requesting something for anxiety.  Crying at nighttime. Presently taking duloxetine. No other complaints or medical concerns today.  HPI   Prior to Admission medications   Medication Sig Start Date End Date Taking? Authorizing Provider  ALPRAZolam Duanne Moron) 0.5 MG tablet Take 1 tablet (0.5 mg total) by mouth 2 (two) times daily as needed for anxiety. 03/14/22  Yes Horald Pollen, MD  aspirin EC 81 MG tablet Take 81 mg by mouth daily.   Yes [provider]  B-D UF III MINI PEN NEEDLES 31G X 5 MM MISC Inject into the skin 3 (three) times daily. 12/06/20  Yes [provider]  B-D UF III MINI PEN NEEDLES 31G X 5 MM MISC CHECK THREE TIMES DAILY DX E11.9 12/22/21  Yes Ross Bender, Ines Bloomer, MD  budesonide-formoterol [facility]) 160-4.5 MCG/ACT inhaler Inhale 2 puffs into the lungs 2 (two) times daily as needed (asthma).   Yes [provider]  cholecalciferol (VITAMIN D) 1000 UNITS tablet Take 1,000 Units by mouth daily.   Yes [provider]  cyclobenzaprine (FLEXERIL) 10 MG tablet TAKE 1 TABLET BY MOUTH THREE TIMES A DAY AS NEEDED FOR MUSCLE SPASMS 12/26/21  Yes Willia Craze, NP  DULoxetine (CYMBALTA) 30 MG capsule TAKE 1 CAPSULE BY MOUTH EVERY DAY 11/23/21  Yes Janna Oak, Ines Bloomer, MD  ergocalciferol (VITAMIN D2) 1.25 MG (50000 UT) capsule Take 50,000 Units by mouth every Wednesday. 04/09/20  Yes [provider]  famotidine (PEPCID) 40 MG tablet TAKE 1 TABLET BY MOUTH EVERYDAY AT BEDTIME 09/22/21  Yes Gatha Mayer, MD  FARXIGA 10 MG TABS tablet Take 10 mg by mouth daily. 12/14/20  Yes [provider]  fenofibrate 160 MG tablet Take 160  mg by mouth daily. 10/28/20  Yes [provider]  ferrous sulfate 325 (65 FE) MG tablet Take 325 mg by mouth daily with breakfast.   Yes [provider]  furosemide (LASIX) 20 MG tablet TAKE 1 TABLET (20 MG TOTAL) BY MOUTH DAILY. PLEASE CONTACT OFFICE FOR APPOINTMENT NEED OV. 05/01/17  Yes [name], Nadean Corwin, MD  glucose blood (ONETOUCH VERIO) test strip CHECK SUGARS 4 TIMES A DAY DX E11.9 11/26/20  Yes [provider]  Insulin Pen Needle (B-D UF III MINI PEN NEEDLES) 31G X 5 MM MISC CHECK THREE TIMES DAILY DX E11.9 05/10/20  Yes [provider]  Insulin Pen Needle (B-D UF III MINI PEN NEEDLES) 31G X 5 MM MISC CHECK THREE TIMES DAILY DX E11.9 05/10/20  Yes [provider]  Insulin Syringe-Needle U-100 31G X 5/16" 0.3 ML MISC CHECK THREE TIMES DAILY DX E11.9 05/10/20  Yes [provider]  Lancets (ONETOUCH DELICA PLUS [id number]) [place] Apply topically. 12/10/20  Yes [provider]  levocetirizine (XYZAL) 5 MG tablet Take 5 mg by mouth every evening. 10/05/20  Yes [provider]  lisinopril (PRINIVIL,ZESTRIL) 20 MG tablet TAKE 1 TABLET (20 MG TOTAL) BY MOUTH DAILY. Patient taking differently: Take 20 mg by mouth in the morning and at bedtime. 04/20/14  Yes [name], Nadean Corwin, MD  metoCLOPramide (REGLAN) 10 MG tablet Take 1 tablet (10 mg total) by mouth every 6 (six) hours  as needed. for nausea 02/20/22  Yes Chadley Dziedzic, Ines Bloomer, MD  metoprolol tartrate (LOPRESSOR) 25 MG tablet TAKE 1 TABLET BY MOUTH TWICE A DAY 11/23/21  Yes Aqueelah Cotrell, Ines Bloomer, MD  mupirocin ointment (BACTROBAN) 2 % Apply 1 Application topically 2 (two) times daily. 12/22/21  Yes McDonald, Stephan Minister, DPM  nitroGLYCERIN (NITROSTAT) 0.4 MG SL tablet Place 1 tablet (0.4 mg total) under the tongue every 5 (five) minutes as needed for chest pain. 08/10/20  Yes [name], Nadean Corwin, MD  NOVOLOG FLEXPEN 100 UNIT/ML FlexPen Inject 10-16 Units into the skin 3 (three) times daily. 12/27/18  Yes  [provider]  omeprazole (PRILOSEC) 40 MG capsule TAKE 1 CAPSULE BY MOUTH EVERY DAY 11/23/21  Yes Laquana Villari, [place], MD  [facility] VERIO test strip 4 (four) times daily. 11/26/20  Yes [provider]  pravastatin (PRAVACHOL) 40 MG tablet TAKE 1 TABLET BY MOUTH DAILY. PLEASE KEEP UPCOMING APPT IN JANUARY 2023 12/19/21  Yes [name], Nadean Corwin, MD  TRESIBA FLEXTOUCH 100 UNIT/ML FlexTouch Pen INJECT 0.5 ML (50 UNITS TOTAL) UNDER THE SKIN DAILY. 01/17/22  Yes Horald Pollen, MD    Allergies  Allergen Reactions   Metformin Diarrhea        Robaxin [Methocarbamol] Itching and Rash    Patient Active Problem List   Diagnosis Date Noted   Dyslipidemia associated with type 2 diabetes mellitus ([place]) 08/03/2021   Depression 02/01/2021   Gastro-esophageal reflux disease with esophagitis 02/01/2021   Gastric polyps - hyperplastic 03/19/2019   Diabetic autonomic neuropathy ([place]) 03/19/2019   Benign prostatic hyperplasia 01/16/2019   Chronic low back pain 01/16/2019   Iron deficiency anemia 01/16/2019   Dyslipidemia 03/10/2016   Atherosclerotic heart disease of native coronary artery without angina pectoris 05/03/2015   Diabetic peripheral neuropathy associated with type 2 diabetes mellitus ([place]) 05/03/2015   Right leg injury, sequela 05/03/2015   Enlarged prostate without lower urinary tract symptoms (luts) 04/30/2015   Major depressive disorder, recurrent, mild ([place]) 04/30/2015   Peripheral vascular disease ([place]) 04/30/2015   Hx of adenomatous colonic polyps 06/11/2014   Hyperlipidemia 02/25/2014   Diabetic gastroparesis ([place]) 02/17/2014   Neuropathy 12/03/2013   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   Presence of aortocoronary bypass graft 02/11/2012   Coronary arteriography abnormal 02/06/2012   Chronic kidney disease, stage 2 (mild) 01/29/2012   Type 1 diabetes mellitus with complication ([place]) 82/99/3716   Hypertension associated with diabetes ([place]) 07/27/2007   GERD  07/27/2007   ARTHRITIS 07/27/2007   NEPHROLITHIASIS, HX OF 07/27/2007    Past Medical History:  Diagnosis Date   Anemia    Anxiety    Arthritis    BPH (benign prostatic hypertrophy)    Cataract    Chronic renal insufficiency, stage III (moderate) ([place])    [place] kidney   Coronary artery disease    Dr Debara Pickett    Depression    Diabetes mellitus without complication ([place]) 9678   insulin dependent   Diabetic autonomic neuropathy ([place]) 03/19/2019   Gastric polyps - hyperplastic 03/19/2019   Gastroparesis due to DM (HCC)    GERD (gastroesophageal reflux disease)    Hx of adenomatous colonic polyps 06/11/2014   Hyperlipidemia    Hypertension    Myocardial infarction ([place])    Neuropathy    PVD (peripheral vascular disease) ([place])    Sleep apnea 2010   refuses to use cpap    Past Surgical History:  Procedure Laterality Date   ANKLE FRACTURE SURGERY Right  CARDIAC CATHETERIZATION  01/30/2012   This demonstrated proximal to mid LAD disease and disease of 2 large OM vessels concerning for surgical disease.   COLONOSCOPY     CORONARY ARTERY BYPASS GRAFT  02/06/2012   Procedure: CORONARY ARTERY BYPASS GRAFTING (CABG); 5- Vessel bypass a LIMA to the LAD, a saphenous vein to the to the diagonal , a saphenous vein to the distal OM, and sequential reverse saphenous vien graft to the posterior descending and 2nd OM. Surgeon: Grace Isaac, MD;  Location: [facility];  Service: Open Heart Surgery;  Laterality: [name];   ESOPHAGOGASTRODUODENOSCOPY  2005   LEFT HEART CATHETERIZATION WITH CORONARY ANGIOGRAM [name] 01/30/2012   Procedure: LEFT HEART CATHETERIZATION WITH CORONARY ANGIOGRAM;  Surgeon: Pixie Casino, MD;  Location: [facility] CATH LAB;  Service: Cardiovascular;  Laterality: [name];   teeth removal  08/16/2021   TOOTH EXTRACTION [name] 09/16/2021   Procedure: DENTAL RESTORATION/EXTRACTIONS;  Surgeon: Diona Browner, DMD;  Location: [facility];  Service: Oral Surgery;  Laterality: [name];   UPPER  GASTROINTESTINAL ENDOSCOPY      Social History   Socioeconomic History   Marital status: Widowed    Spouse name: Not on file   Number of children: 4   Years of education: Not on file   Highest education level: Not on file  Occupational History   Occupation: disabled  Tobacco Use   Smoking status: Never   Smokeless tobacco: Never  Vaping Use   Vaping Use: Never used  Substance and Sexual Activity   Alcohol use: No   Drug use: No   Sexual activity: Not Currently  Other Topics Concern   Not on file  Social History Narrative   Widowed, 1 son and 4 daughters   Disabled and dual eligible Medicare and Medicaid   One caffeinated beverage daily   Social Determinants of Health   Financial Resource Strain: Low Risk  (02/03/2022)   Overall Financial Resource Strain (CARDIA)    Difficulty of Paying Living Expenses: Not hard at all  Food Insecurity: No Food Insecurity (02/03/2022)   Hunger Vital Sign    Worried About Running Out of Food in the Last Year: Never true    [place] in the Last Year: Never true  Transportation Needs: No Transportation Needs (02/03/2022)   PRAPARE - Hydrologist (Medical): No    Lack of Transportation (Non-Medical): No  Physical Activity: Insufficiently Active (02/03/2022)   Exercise Vital Sign    Days of Exercise per Week: 3 days    Minutes of Exercise per Session: 30 min  Stress: No Stress Concern Present (02/03/2022)   Gas    Feeling of Stress : Not at all  Social Connections: Socially Isolated (02/03/2022)   Social Connection and Isolation Panel [NHANES]    Frequency of Communication with Friends and Family: More than three times a week    Frequency of Social Gatherings with Friends and Family: More than three times a week    Attends Religious Services: Never    Marine scientist or Organizations: No    Attends Archivist  Meetings: Never    Marital Status: Widowed  Intimate Partner Violence: Not At Risk (02/03/2022)   Humiliation, Afraid, Rape, and Kick questionnaire    Fear of Current or Ex-Partner: No    Emotionally Abused: No    Physically Abused: No    Sexually Abused: No    Family History  Problem Relation Age of Onset   Diabetes Father    Hypertension Father    Cancer Sister        unknown   Colon cancer Neg Hx    Esophageal cancer Neg Hx    Rectal cancer Neg Hx    Stomach cancer Neg Hx    Pancreatic cancer Neg Hx      Review of Systems  Constitutional: Negative.  Negative for chills and fever.  HENT: Negative.    Respiratory: Negative.  Negative for sputum production.   Cardiovascular: Negative.  Negative for chest pain and palpitations.  Gastrointestinal:  Negative for abdominal pain, diarrhea, nausea and vomiting.  Neurological: Negative.  Negative for dizziness and headaches.  All other systems reviewed and are negative.  Today's Vitals   03/14/22 1328  BP: 126/82  Pulse: 65  Temp: 98.4 F (36.9 C)  TempSrc: Oral  SpO2: 98%  Weight: 194 lb 4 oz (88.1 kg)  Height: '5\' 6"'$  (1.676 m)   Body mass index is 31.35 kg/m.   Physical Exam Vitals reviewed.  Constitutional:      Appearance: Normal appearance.  HENT:     Head: Normocephalic.  Eyes:     Extraocular Movements: Extraocular movements intact.  Cardiovascular:     Rate and Rhythm: Normal rate.  Pulmonary:     Effort: Pulmonary effort is normal.  Skin:    General: Skin is warm and dry.  Neurological:     Mental Status: He is alert and oriented to person, place, and time.  Psychiatric:        Behavior: Behavior normal.     Comments: Sad      ASSESSMENT & PLAN: A total of 33 minutes was spent with the patient and counseling/coordination of care regarding preparing for this visit, review of most recent office visit notes, review of chronic medical problems under management, review of all medications, diagnosis  of acute anxiety and management, prognosis, documentation, and need for follow-up.  Problem List Items Addressed This Visit       Other   Grieving - Primary    Exacerbating anxiety. Continue Cymbalta 30 mg daily Start alprazolam 0.5 mg as needed      Situational mixed anxiety and depressive disorder    Exacerbated by recent loss of brother Continue duloxetine 30 mg daily Alprazolam 0.5 mg as needed.      Relevant Medications   ALPRAZolam (XANAX) 0.5 MG tablet   Patient Instructions  Control de la ansiedad en los adultos Managing Anxiety, Adult Despus de haber recibido un diagnstico de ansiedad, es posible que se sienta aliviado al saber por qu se haba sentido o haba actuado de cierto modo. Es posible que tambin se sienta abrumado por el tratamiento que tiene por delante y por lo que este significar para su vida. Con atencin y 63, usted puede controlar esta afeccin. Cmo manejar los cambios en el estilo de vida Control del estrs y la ansiedad  El estrs es la reaccin del cuerpo ante los cambios y los acontecimientos de la vida, tanto buenos [place]. La [employer] de los episodios de estrs duran slo algunas horas, pero el estrs puede ser continuo y Forensic psychologist a ms que solo estrs. Aunque el estrs puede desempear un papel importante en la ansiedad, no es lo mismo que la ansiedad. El estrs generalmente es causado por algo externo, como una fecha lmite, una prueba o una competencia. El estrs normalmente pasa despus de que el evento desencadenante ha terminado.  La ansiedad es causada por algo interno, por ejemplo, imaginar un resultado terrible o preocuparse porque algo ir mal y lo devastar. A menudo, la ansiedad no desaparece incluso despus de que el evento desencadenante ha finalizado y puede tornarse en una preocupacin a largo plazo (crnica). Es importante comprender las diferencias entre el estrs y la ansiedad, y Chief Technology Officer el estrs de manera efectiva para que  no genere una respuesta de ansiedad. Hable con el mdico o un consejero para obtener ms informacin sobre cmo reducir la ansiedad y Dealer. Es posible que el profesional sugiera tcnicas para reducir la tensin, tales como: Musicoterapia. Pase tiempo creando o escuchando msica que disfrute y lo inspire. Meditacin consciente. Practique el estar consciente de la respiracin normal sin intentar controlarla. Puede realizarse mientras est sentado o camina. Oracin centrante. Esto implica centrarse en una palabra, frase o imagen sagrada que le signifique algo y le genere paz. Respiracin profunda. Para hacer esto, expanda el estmago e inhale lentamente por la nariz. [place] unos 3 a 5 segundos. Luego, exhale lentamente mientras deja que los msculos del estmago se relajen. Dilogo interno. Aprenda a notar e identificar patrones de pensamiento que conducen a reacciones de ansiedad y cambie esos patrones a pensamientos que se transmitan tranquilidad. Relajacin muscular. Dedique tiempo a tensar los msculos y [employer]. Elija una tcnica para reducir la tensin que se adapte a su estilo de vida y su personalidad. Estas tcnicas llevan tiempo y prctica. Resrvese de 5 a 15 minutos por da para Ambulance person. Algunos terapeutas pueden ofrecer orientacin y capacitacin en estas tcnicas. Es posible que algunos planes de seguros mdicos cubran la capacitacin. Otras cosas que puede hacer para controlar el estrs y la ansiedad incluyen: Catering manager un registro del estrs. Esto puede ayudarlo a identificar qu le desencadena su reaccin y, Air traffic controller, aprender las maneras de controlar su respuesta al [place]. Pensar en cmo reacciona ante ciertas situaciones. Es posible que no sea capaz de Chief Technology Officer todo, pero puede Environmental education officer. Hacerse tiempo para las actividades que lo ayudan a Nurse, children's y no sentir culpa por pasar su tiempo de [place]. Crear imgenes visuales. Esto implica  imaginar o crear imgenes mentales para ayudarlo a relajarse. Practicar yoga. A travs del yoga, puede disminuir la tensin y Emergency planning/management officer.  Medicamentos Los medicamentos pueden ayudar a [employer]. Algunos medicamentos para la ansiedad: Antidepresivos. Por lo general, se recetan para el control diario a Barrister's clerk. Medicamentos para la ansiedad. Estos se pueden agregar en casos graves, especialmente cuando ocurren crisis de [place]. Un mdico recetar los medicamentos. Cuando se usan juntos, los medicamentos, la psicoterapia y las tcnicas de reduccin de la tensin pueden ser el tratamiento ms efectivo. [place] interpersonales pueden ser muy importantes para ayudar a su recuperacin. Intente pasar ms tiempo interactuando con amigos y familiares de [place]. Considere la posibilidad de ir a terapia de pareja, si tiene una pareja, tomar clases de educacin familiar o ir a Careers information officer. La terapia puede ayudarlos a usted y a los dems a comprender mejor su afeccin. Cmo Museum/gallery conservator en su ansiedad Cada persona responde de [place] diferente al tratamiento de la ansiedad. Se dice que est recuperado de la ansiedad cuando los sntomas disminuyen y dejan de Cabin crew en las actividades diarias en el hogar o [place]. Esto podra significar que comenzar a Field seismologist lo siguiente: Associate Professor y atencin. Tener menos interferencia de la preocupacin en el pensamiento diario. Dormir mejor. Estar  menos irritable. Tener ms energa. Tener [employer]. Tambin es Glass blower/designer cuando su afeccin empeora. Comunquese con el mdico si sus sntomas interfieren en su hogar o su trabajo, y usted siente que su afeccin no est mejorando. Siga estas instrucciones en su casa: Actividad Actividad fsica. Los adultos deben hacer lo siguiente: Optometrist, al menos, 150 minutos de actividad fsica por semana. El ejercicio debe aumentar la  frecuencia cardaca y Nature conservation officer transpirar (ejercicio de intensidad moderada). Realizar ejercicios de fortalecimiento por lo [employer] por semana. Dormir bien y por el tiempo adecuado. La [employer] de los adultos necesitan entre 7 y 9 horas de sueo todas las noches. Estilo de vida  Siga una dieta saludable que incluya abundantes frutas, verduras, cereales integrales, productos lcteos descremados y protenas magras. No consuma muchos alimentos con alto contenido de grasas, azcares agregados o sal (sodio). Opte por cosas que le simplifiquen la vida. No consuma ningn producto que contenga nicotina o tabaco. Estos productos incluyen cigarrillos, tabaco para Higher education careers adviser y aparatos de vapeo, como los Psychologist, sport and exercise. Si necesita ayuda para dejar de fumar, consulte al mdico. Evite el consumo de cafena, alcohol y ciertos medicamentos contra el resfro de venta sin receta. Estos podran Engineer, building services. Pregntele al farmacutico qu medicamentos no debera tomar. Instrucciones generales Use los medicamentos de venta libre y los recetados solamente como se lo haya indicado el mdico. Concurra a todas las visitas de seguimiento. Esto es importante. Dnde buscar apoyo Puede conseguir ayuda y [employer] siguientes lugares: Grupos de [place]. Organizaciones comunitarias y en lnea. Un lder espiritual de confianza. Terapia de pareja. Clases de educacin familiar. Terapia familiar. Dnde obtener ms informacin Formar parte de un grupo de apoyo podra resultarle til para enfrentar la ansiedad. Las siguientes fuentes pueden ayudarlo a Orthoptist consejeros o grupos de apoyo cerca de su hogar: [place] (Salud Mental de los [place]): www.mentalhealthamerica.net Anxiety and Depression Association of [place] [ADAA] (Asociacin de Ansiedad y Depresin de los Estados Unidos): https://www.clark.net/ National Alliance on Mental Illness [NAMI] ([place]): www.nami.org Comunquese con un mdico si: Le resulta difcil permanecer concentrado o finalizar las tareas diarias. Pasa muchas horas por da sintindose preocupado por la vida cotidiana. La preocupacin le provoca un cansancio extremo. Comienza a tener dolores de [place] o se siente tenso con frecuencia. Tiene nuseas crnicas y/o diarrea. Solicite ayuda de inmediato si: Se le acelera la frecuencia cardaca y Industrial/product designer. Piensa acerca de lastimarse a usted misma o a Producer, television/film/video. Si alguna vez siente que puede lastimarse o Market researcher a Producer, television/film/video, o tiene pensamientos de poner fin a su vida, busque ayuda de inmediato. Dirjase al servicio de urgencias ms cercano o: Comunquese con el servicio de emergencias de su localidad (911 en los Estados [place]idos). Llame a una lnea de asistencia al suicida y atencin en crisis como National Suicide Prevention Lifeline ([place]) al [phone]. Est disponible las 24 horas del da en los [place]. Enve un mensaje de texto a la lnea para casos de crisis al [phone] (en los EE. UU.). Resumen Tomar medidas para aprender y usar tcnicas de reduccin de la tensin pueden ayudarlo a calmarse y a Conservation officer, historic buildings reaccin de ansiedad. Cuando se usan juntos, los medicamentos, la psicoterapia y las tcnicas de reduccin de la tensin pueden ser el tratamiento ms efectivo. [employer], los amigos y las parejas pueden desempear un papel importante para brindarle apoyo. Esta informacin no tiene Energy [employer] Partners  fin reemplazar el consejo del mdico. Asegrese de hacerle al mdico cualquier pregunta que tenga. Document Revised: 07/22/2020 Document Reviewed: 07/22/2020 Elsevier Patient Education  [place], MD [facility]

## 2022-03-22 ENCOUNTER — Other Ambulatory Visit: Payer: Self-pay | Admitting: Emergency Medicine

## 2022-03-29 ENCOUNTER — Encounter: Payer: Self-pay | Admitting: Emergency Medicine

## 2022-03-29 MED ORDER — VITAMIN D3 25 MCG (1000 UT) PO CAPS: 1000.0000 [IU] | ORAL_CAPSULE | Freq: Every day | ORAL | 0 refills | Status: DC

## 2022-03-29 NOTE — Telephone Encounter (Signed)
Okay to refill? 

## 2022-04-03 ENCOUNTER — Encounter: Payer: Self-pay | Admitting: Emergency Medicine

## 2022-04-03 MED ORDER — LEVOCETIRIZINE DIHYDROCHLORIDE 5 MG PO TABS: 5.0000 mg | ORAL_TABLET | Freq: Every evening | ORAL | 3 refills | Status: DC

## 2022-04-03 NOTE — Telephone Encounter (Signed)
Okay to prescribe?

## 2022-04-08 ENCOUNTER — Other Ambulatory Visit: Payer: Self-pay | Admitting: Nurse Practitioner

## 2022-04-10 ENCOUNTER — Telehealth: Payer: Self-pay | Admitting: Emergency Medicine

## 2022-04-10 NOTE — Telephone Encounter (Signed)
Patient requested to switch care from Dr. Mitchel Honour to Dr. Jenny Reichmann. Please advise me on your decision if ok to switch providers.

## 2022-04-10 NOTE — Telephone Encounter (Signed)
Unfortunately I would not be able to accept this pt at this time, thanks

## 2022-04-11 ENCOUNTER — Encounter: Payer: Self-pay | Admitting: Emergency Medicine

## 2022-04-11 ENCOUNTER — Other Ambulatory Visit: Payer: Self-pay | Admitting: Emergency Medicine

## 2022-04-12 ENCOUNTER — Other Ambulatory Visit: Payer: Self-pay | Admitting: Emergency Medicine

## 2022-04-12 MED ORDER — METOCLOPRAMIDE HCL 10 MG PO TABS: 10.0000 mg | ORAL_TABLET | Freq: Four times a day (QID) | ORAL | 0 refills | Status: AC | PRN

## 2022-04-12 NOTE — Telephone Encounter (Signed)
New prescription for Reglan sent to pharmacy of record today.

## 2022-04-12 NOTE — Telephone Encounter (Signed)
He also requested transfer of care. What gives?

## 2022-04-12 NOTE — Telephone Encounter (Signed)
Patients daughter asked for a return call at [phone]

## 2022-04-20 ENCOUNTER — Telehealth (INDEPENDENT_AMBULATORY_CARE_PROVIDER_SITE_OTHER): Payer: Medicare HMO | Admitting: Nurse Practitioner

## 2022-04-20 ENCOUNTER — Ambulatory Visit: Payer: Medicare HMO | Admitting: Emergency Medicine

## 2022-04-20 DIAGNOSIS — F330 Major depressive disorder, recurrent, mild: Secondary | ICD-10-CM

## 2022-04-20 DIAGNOSIS — G4730 Sleep apnea, unspecified: Secondary | ICD-10-CM | POA: Diagnosis not present

## 2022-04-20 DIAGNOSIS — R5383 Other fatigue: Secondary | ICD-10-CM

## 2022-04-20 NOTE — Assessment & Plan Note (Signed)
Seems to have worsened over the last few weeks, will collect labs for further evaluation.  Further recommendations may be made based upon the results.

## 2022-04-20 NOTE — Assessment & Plan Note (Addendum)
Appears to have worsened since the loss of his brother.  PHQ-9 score of 18 and GAD-7 score of 14.  6Cit screening completed today with low score of 2 points.  Labs ordered for further evaluation, may consider increasing dose of duloxetine.  Will also refer to psychiatry for assistance with managing depression and anxiety.

## 2022-04-20 NOTE — Progress Notes (Signed)
Established Patient Office Visit  An audio/visual tele-health visit was completed today for this patient. I connected with  Joseph [name] on 04/20/22 utilizing audio/visual technology and verified that I am speaking with the correct person using two identifiers. The patient was located at their car, and I was located at the office of [place] at [facility] during the encounter. I discussed the limitations of evaluation and management by telemedicine. The patient expressed understanding and agreed to proceed.    Subjective   Patient ID: Joseph [name], male    DOB: 05-01-1955  Age: 67 y.o. MRN: [id number]  Chief Complaint  Patient presents with   Depression    Pt been depressed since the passed of late brother, has been forgetful ( which is not like him since he has good memory)  Daughter stated pt is aware of his this issue. Wants therapy to talk things through and medication to help with and anxiety  Was prescribed medication for anxiety before which help according to daughter    Patient arrives for virtual visit for the above.  Reports a few weeks ago brother was killed.  Has been worsening anxiety and depression since then.  Has a history of anxiety pression currently takes duloxetine 30 mg by mouth daily as well as Xanax 0.5 mg twice a day as needed.  Xanax is a fairly new prescription and patient has run out of tablets as of right now.  Denies suicidal ideation but does have insomnia currently.  Family member also accompanies patient today and reports that she is concerned about his forgetfulness.  Patient also reports he is experiencing fatigue, orthopnea, and intermittent ankle swelling.  Is supposed to use CPAP at night for diagnosis of obstructive sleep apnea, but reports he does not use this due to feelings of claustrophobia in the past.     Review of Systems  Respiratory:  Negative for cough and shortness of breath.   Cardiovascular:  Positive for orthopnea and  leg swelling (intermittent). Negative for chest pain.  Psychiatric/Behavioral:  Positive for depression. Negative for suicidal ideas. The patient is nervous/anxious and has insomnia.       Objective:     There were no vitals taken for this visit.   Physical Exam Comprehensive physical exam not completed today as office visit was conducted remotely.  Patient appears well on video. Patient was alert and oriented, and appeared to have appropriate judgment.      04/20/2022    4:44 PM 03/14/2022    1:29 PM 02/07/2022    1:12 PM  PHQ9 SCORE ONLY  PHQ-9 Total Score 18 19 0       04/20/2022    5:19 PM  GAD 7 : Generalized Anxiety Score  Nervous, Anxious, on Edge 3  Control/stop worrying 2  Worry too much - different things 3  Trouble relaxing 0  Restless 3  Easily annoyed or irritable 0  Afraid - awful might happen 3  Total GAD 7 Score 14      04/20/2022    5:22 PM 02/03/2022   11:36 AM  6CIT Screen  What Year? 0 points 0 points  What month? 0 points 0 points  What time? 0 points 0 points  Count back from 20 2 points 0 points  Months in reverse 0 points 2 points  Repeat phrase 0 points 2 points  Total Score 2 points 4 points       No results found for any visits on  04/20/22.    The ASCVD Risk score (Arnett DK, et al., 2019) failed to calculate for the following reasons:   The patient has a prior MI or stroke diagnosis    Assessment & Plan:   Problem List Items Addressed This Visit       Respiratory   Sleep apnea    Chronic, patient not using CPAP as recommended previously.  Reports has not been on CPAP for a long time.  Will refer to [facility] for assistance with evaluating and managing his sleep apnea.      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  PHQ-9 score of 18 and GAD-7 score of 14.   6Cit screening completed today with low score of 2 points.  Labs ordered for further evaluation, may consider increasing dose of duloxetine.  Will also refer to psychiatry for assistance with managing depression and anxiety.      Relevant Orders   Vitamin B12   CBC   Comprehensive metabolic panel   TSH   Ambulatory referral to Psychiatry   Fatigue    Seems to have worsened over the last few weeks, will collect labs for further evaluation.  Further recommendations may be made based upon the results.      Relevant Orders   Brain natriuretic peptide    Return in about 6 weeks (around 06/01/2022) for With Guy Toney.    Ailene Ards, NP

## 2022-04-20 NOTE — Assessment & Plan Note (Signed)
Chronic, patient not using CPAP as recommended previously.  Reports has not been on CPAP for a long time.  Will refer to [facility] for assistance with evaluating and managing his sleep apnea.

## 2022-04-21 ENCOUNTER — Other Ambulatory Visit: Payer: Self-pay | Admitting: Internal Medicine

## 2022-04-22 LAB — TSH: TSH: 3.67 u[IU]/mL (ref 0.450–4.500)

## 2022-04-22 LAB — COMPREHENSIVE METABOLIC PANEL
ALT: 15 IU/L (ref 0–44)
AST: 20 IU/L (ref 0–40)
Albumin/Globulin Ratio: 1.7 (ref 1.2–2.2)
Albumin: 4.2 g/dL (ref 3.9–4.9)
Alkaline Phosphatase: 42 IU/L — ABNORMAL LOW (ref 44–121)
BUN/Creatinine Ratio: 14 (ref 10–24)
BUN: 22 mg/dL (ref 8–27)
Bilirubin Total: 0.4 mg/dL (ref 0.0–1.2)
CO2: 22 mmol/L (ref 20–29)
Calcium: 9.3 mg/dL (ref 8.6–10.2)
Chloride: 103 mmol/L (ref 96–106)
Creatinine, Ser: 1.62 mg/dL — ABNORMAL HIGH (ref 0.76–1.27)
Globulin, Total: 2.5 g/dL (ref 1.5–4.5)
Glucose: 205 mg/dL — ABNORMAL HIGH (ref 70–99)
Potassium: 4.5 mmol/L (ref 3.5–5.2)
Sodium: 141 mmol/L (ref 134–144)
Total Protein: 6.7 g/dL (ref 6.0–8.5)
eGFR: 46 mL/min/{1.73_m2} — ABNORMAL LOW (ref >59)

## 2022-04-22 LAB — CBC
Hematocrit: 40.2 % (ref 37.5–51.0)
Hemoglobin: 13.1 g/dL (ref 13.0–17.7)
MCH: 28.1 pg (ref 26.6–33.0)
MCHC: 32.6 g/dL (ref 31.5–35.7)
MCV: 86 fL (ref 79–97)
Platelets: 275 10*3/uL (ref 150–450)
RBC: 4.66 x10E6/uL (ref 4.14–5.80)
RDW: 13 % (ref 11.6–15.4)
WBC: 6.2 10*3/uL (ref 3.4–10.8)

## 2022-04-22 LAB — BRAIN NATRIURETIC PEPTIDE: BNP: 36.2 pg/mL (ref 0.0–100.0)

## 2022-04-22 LAB — VITAMIN B12: Vitamin B-12: 666 pg/mL (ref 232–1245)

## 2022-04-27 ENCOUNTER — Other Ambulatory Visit: Payer: Self-pay | Admitting: Nurse Practitioner

## 2022-04-27 DIAGNOSIS — F330 Major depressive disorder, recurrent, mild: Secondary | ICD-10-CM

## 2022-04-27 MED ORDER — DULOXETINE HCL 60 MG PO CPEP: 60.0000 mg | ORAL_CAPSULE | Freq: Every day | ORAL | 1 refills | Status: DC

## 2022-04-27 NOTE — Progress Notes (Signed)
Spoke to pt daughter in regards to provider's notes about increase in pt medication and referral to psychiatrists.

## 2022-04-27 NOTE — Addendum Note (Signed)
Addended by: Jeralyn Ruths E on: 04/27/2022 11:52 AM   Modules accepted: Orders

## 2022-04-27 NOTE — Progress Notes (Signed)
Left voice mail for pt to call back for lab results

## 2022-04-27 NOTE — Progress Notes (Signed)
Please call patient and let him know that after reviewing his labs I recommend he increase his dose of duloxetine to '60mg'$  by mouth daily. I have sent a new prescription to the [facility] in his chart. If he experiences any suicidal ideation after increasing this dose he should go back to the '30mg'$  and notify this office. He should also follow-up as scheduled to determine his response to the medication dose increase. He should also follow-up with psychiatry as referred. Have them let us know if they haven't heard from a psychiatry office to schedule.   His labs showed normal B12 levels and normal thyroid screen.

## 2022-04-27 NOTE — Progress Notes (Signed)
I have ordered new referral to outpatient behavioral health in [place], I am not sure if they are accepting new patients but we should hear if they are not accepting new ones in a few days. Referral placed.

## 2022-04-28 NOTE — Progress Notes (Signed)
Spoke to pt daughter and made her aware of the referral send to the one she prefers and to keep [place] updated if there is no call within the next week or 2 weeks

## 2022-05-08 ENCOUNTER — Other Ambulatory Visit: Payer: Self-pay | Admitting: Emergency Medicine

## 2022-05-11 ENCOUNTER — Telehealth: Payer: Medicare HMO | Admitting: Family Medicine

## 2022-05-11 DIAGNOSIS — J069 Acute upper respiratory infection, unspecified: Secondary | ICD-10-CM

## 2022-05-11 MED ORDER — BENZONATATE 100 MG PO CAPS: 100.0000 mg | ORAL_CAPSULE | Freq: Three times a day (TID) | ORAL | 0 refills | Status: DC | PRN

## 2022-05-11 NOTE — Patient Instructions (Signed)
Joseph [name], thank you for joining [name], NP for today's virtual visit.  While this provider is not your primary care provider (PCP), if your PCP is located in our provider database this encounter information will be shared with them immediately following your visit.   [facility] account gives you access to today's visit and all your visits, tests, and labs performed at [facility] " click here if you don't have a [facility] account or go to [email or web address]  Consent: (Patient) Joseph [name] provided verbal consent for this virtual visit at the beginning of the encounter.  Current Medications:  Current Outpatient Medications:    benzonatate (TESSALON) 100 MG capsule, Take 1 capsule (100 mg total) by mouth 3 (three) times daily as needed for cough., Disp: 20 capsule, Rfl: 0   ALPRAZolam (XANAX) 0.5 MG tablet, Take 1 tablet (0.5 mg total) by mouth 2 (two) times daily as needed for anxiety., Disp: 20 tablet, Rfl: 1   aspirin EC 81 MG tablet, Take 81 mg by mouth daily., Disp: , Rfl:    B-D UF III MINI PEN NEEDLES 31G X 5 MM MISC, Inject into the skin 3 (three) times daily., Disp: , Rfl:    B-D UF III MINI PEN NEEDLES 31G X 5 MM MISC, CHECK THREE TIMES DAILY DX E11.9, Disp: 300 each, Rfl: 1   budesonide-formoterol (SYMBICORT) 160-4.5 MCG/ACT inhaler, Inhale 2 puffs into the lungs 2 (two) times daily as needed (asthma)., Disp: , Rfl:    Cholecalciferol (VITAMIN D3) 25 MCG (1000 UT) CAPS, Take 1 capsule (1,000 Units total) by mouth daily., Disp: 90 capsule, Rfl: 0   cyclobenzaprine (FLEXERIL) 10 MG tablet, TAKE 1 TABLET BY MOUTH THREE TIMES A DAY AS NEEDED FOR MUSCLE SPASM, Disp: 90 tablet, Rfl: 3   DULoxetine (CYMBALTA) 60 MG capsule, Take 1 capsule (60 mg total) by mouth daily., Disp: 30 capsule, Rfl: 1   ergocalciferol (VITAMIN D2) 1.25 MG (50000 UT) capsule, Take 50,000 Units by mouth every Wednesday., Disp: , Rfl:    famotidine (PEPCID) 40  MG tablet, TAKE 1 TABLET BY MOUTH EVERYDAY AT BEDTIME, Disp: 90 tablet, Rfl: 3   FARXIGA 10 MG TABS tablet, Take 10 mg by mouth daily., Disp: , Rfl:    fenofibrate 160 MG tablet, Take 160 mg by mouth daily., Disp: , Rfl:    ferrous sulfate 325 (65 FE) MG tablet, Take 325 mg by mouth daily with breakfast., Disp: , Rfl:    furosemide (LASIX) 20 MG tablet, TAKE 1 TABLET (20 MG TOTAL) BY MOUTH DAILY. PLEASE CONTACT OFFICE FOR APPOINTMENT NEED OV., Disp: 30 tablet, Rfl: 0   glucose blood (ONETOUCH VERIO) test strip, CHECK SUGARS 4 TIMES A DAY DX E11.9, Disp: , Rfl:    insulin aspart (NOVOLOG FLEXPEN) 100 UNIT/ML FlexPen, INJECT 3 TIMES A DAY PER SLIDING SCALE UP TO 30 UNITS A DAY DX E11.9, Disp: 3 mL, Rfl: 5   Insulin Pen Needle (B-D UF III MINI PEN NEEDLES) 31G X 5 MM MISC, CHECK THREE TIMES DAILY DX E11.9, Disp: , Rfl:    Insulin Pen Needle (B-D UF III MINI PEN NEEDLES) 31G X 5 MM MISC, CHECK THREE TIMES DAILY DX E11.9, Disp: , Rfl:    Insulin Syringe-Needle U-100 31G X 5/16" 0.3 ML MISC, CHECK THREE TIMES DAILY DX E11.9, Disp: , Rfl:    Lancets (ONETOUCH DELICA PLUS [id number]) MISC, Apply topically., Disp: , Rfl:    levocetirizine (XYZAL) 5 MG tablet, Take  1 tablet (5 mg total) by mouth every evening., Disp: 90 tablet, Rfl: 3   lisinopril (PRINIVIL,ZESTRIL) 20 MG tablet, TAKE 1 TABLET (20 MG TOTAL) BY MOUTH DAILY. (Patient taking differently: Take 20 mg by mouth in the morning and at bedtime.), Disp: 90 tablet, Rfl: 3   metoCLOPramide (REGLAN) 10 MG tablet, Take 1 tablet (10 mg total) by mouth every 6 (six) hours as needed. for nausea, Disp: 60 tablet, Rfl: 0   metoprolol tartrate (LOPRESSOR) 25 MG tablet, TAKE 1 TABLET BY MOUTH TWICE A DAY, Disp: 180 tablet, Rfl: 1   mupirocin ointment (BACTROBAN) 2 %, Apply 1 Application topically 2 (two) times daily., Disp: 30 g, Rfl: 2   nitroGLYCERIN (NITROSTAT) 0.4 MG SL tablet, Place 1 tablet (0.4 mg total) under the tongue every 5 (five) minutes as needed for  chest pain., Disp: 20 tablet, Rfl: 3   omeprazole (PRILOSEC) 40 MG capsule, TAKE 1 CAPSULE BY MOUTH EVERY DAY, Disp: 90 capsule, Rfl: 1   ONETOUCH VERIO test strip, 4 (four) times daily., Disp: , Rfl:    pravastatin (PRAVACHOL) 40 MG tablet, TAKE 1 TABLET BY MOUTH DAILY. PLEASE KEEP UPCOMING APPT IN JANUARY 2023, Disp: 90 tablet, Rfl: 1   TRESIBA FLEXTOUCH 100 UNIT/ML FlexTouch Pen, INJECT 0.5 ML (50 UNITS TOTAL) UNDER THE SKIN DAILY., Disp: 3 mL, Rfl: 1   Medications ordered in this encounter:  Meds ordered this encounter  Medications   benzonatate (TESSALON) 100 MG capsule    Sig: Take 1 capsule (100 mg total) by mouth 3 (three) times daily as needed for cough.    Dispense:  20 capsule    Refill:  0    Order Specific Question:   Supervising Provider    Answer:   Chase Picket [id number]     *If you need refills on other medications prior to your next appointment, please contact your pharmacy*  Follow-Up: Call back or seek an in-person evaluation if the symptoms worsen or if the condition fails to improve as anticipated.  [place] [phone]  Other Instructions  -Take meds as prescribed -Rest -Mucinex -Use a cool mist humidifier especially during the winter months when heat dries out the air. - Use saline nose sprays frequently to help soothe nasal passages and promote drainage. -Saline irrigations of the nose can be very helpful if done frequently.             * 4X daily for 1 week*             * Use of a nettie pot can be helpful with this.  *Follow directions with this* *Boiled or distilled water only -stay hydrated by drinking plenty of fluids - Keep thermostat turn down low to prevent drying out sinuses - For any cough or congestion- robitussin DM or Delsym as needed - For fever or aches or pains- take tylenol or ibuprofen as directed on bottle             * for fevers greater than 101 orally you may alternate ibuprofen and tylenol every 3  hours.  If you do not improve you will need a follow up visit in person.                  If you have been instructed to have an in-person evaluation today at a local Urgent Care facility, please use the link below. It will take you to a list of all of our available [facility], including address, phone number and hours of operation. Please do not delay care.  [facility]  If you or a family member do not have a primary care provider, use the link below to schedule a visit and establish care. When you choose a [place] [facility] primary care physician or advanced practice provider, you gain a long-term partner in health. Find a Primary Care Provider  Learn more about [facility] in-office and virtual care options: [facility]

## 2022-05-11 NOTE — Progress Notes (Signed)
Virtual Visit Consent   Joseph [name], you are scheduled for a virtual visit with a [facility] provider today. Just as with appointments in the office, your consent must be obtained to participate. Your consent will be active for this visit and any virtual visit you may have with one of our providers in the next 365 days. If you have a MyChart account, a copy of this consent can be sent to you electronically.  As this is a virtual visit, video technology does not allow for your provider to perform a traditional examination. This may limit your provider's ability to fully assess your condition. If your provider identifies any concerns that need to be evaluated in person or the need to arrange testing (such as labs, EKG, etc.), we will make arrangements to do so. Although advances in technology are sophisticated, we cannot ensure that it will always work on either your end or our end. If the connection with a video visit is poor, the visit may have to be switched to a telephone visit. With either a video or telephone visit, we are not always able to ensure that we have a secure connection.  By engaging in this virtual visit, you consent to the provision of healthcare and authorize for your insurance to be billed (if applicable) for the services provided during this visit. Depending on your insurance coverage, you may receive a charge related to this service.  I need to obtain your verbal consent now. Are you willing to proceed with your visit today? Joseph [name] has provided verbal consent on 05/11/2022 for a virtual visit (video or telephone). [name], NP  Date: 05/11/2022 1:10 PM  Virtual Visit via Video Note   I, [name], connected with  Joseph [name]  ([id number], Jul 12, 1955) on 05/11/22 at  1:15 PM EST by a video-enabled telemedicine application and verified that I am speaking with the correct person using two identifiers.  Location: Patient: Virtual Visit Location Patient:  Home Provider: Virtual Visit Location Provider: Home Office   I discussed the limitations of evaluation and management by telemedicine and the availability of in person appointments. The patient expressed understanding and agreed to proceed.    History of Present Illness: Joseph [name] is a 67 y.o. who identifies as a male who was assigned male at birth, and is being seen today for sore throat. Onset was 3-4 days with sore throat, runny nose, coughing-mucus production. Denies fevers, chills, chest pain, shortness of breath, body aches. Colds in the home- no covid, flu, rsv- family members tested and were negative for those. Has tried some tylenol cold.   Problems:  Patient Active Problem List   Diagnosis Date Noted   Fatigue 04/20/2022   Sleep apnea 04/20/2022   Grieving 03/14/2022   Situational mixed anxiety and depressive disorder 03/14/2022   Dyslipidemia associated with type 2 diabetes mellitus ([place]) 08/03/2021   Depression 02/01/2021   Gastro-esophageal reflux disease with esophagitis 02/01/2021   Gastric polyps - hyperplastic 03/19/2019   Diabetic autonomic neuropathy ([place]) 03/19/2019   Benign prostatic hyperplasia 01/16/2019   Chronic low back pain 01/16/2019   Iron deficiency anemia 01/16/2019   Dyslipidemia 03/10/2016   Atherosclerotic heart disease of native coronary artery without angina pectoris 05/03/2015   Diabetic peripheral neuropathy associated with type 2 diabetes mellitus ([place]) 05/03/2015   Right leg injury, sequela 05/03/2015   Enlarged prostate without lower urinary tract symptoms (luts) 04/30/2015   Major depressive disorder, recurrent, mild ([employer]) 04/30/2015  Peripheral vascular disease ([place]) 04/30/2015   Hx of adenomatous colonic polyps 06/11/2014   Hyperlipidemia 02/25/2014   Diabetic gastroparesis ([place]) 02/17/2014   Neuropathy 12/03/2013   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   Presence of aortocoronary bypass graft 02/11/2012   Coronary arteriography abnormal  02/06/2012   Chronic kidney disease, stage 2 (mild) 01/29/2012   Type 1 diabetes mellitus with complication ([place]) [id number]   Hypertension associated with diabetes ([place]) 07/27/2007   GERD 07/27/2007   ARTHRITIS 07/27/2007   NEPHROLITHIASIS, HX OF 07/27/2007    Allergies:  Allergies  Allergen Reactions   Metformin Diarrhea        Robaxin [Methocarbamol] Itching and Rash   Medications:  Current Outpatient Medications:    ALPRAZolam (XANAX) 0.5 MG tablet, Take 1 tablet (0.5 mg total) by mouth 2 (two) times daily as needed for anxiety., Disp: 20 tablet, Rfl: 1   aspirin EC 81 MG tablet, Take 81 mg by mouth daily., Disp: , Rfl:    B-D UF III MINI PEN NEEDLES 31G X 5 MM MISC, Inject into the skin 3 (three) times daily., Disp: , Rfl:    B-D UF III MINI PEN NEEDLES 31G X 5 MM MISC, CHECK THREE TIMES DAILY DX E11.9, Disp: 300 each, Rfl: 1   budesonide-formoterol (SYMBICORT) 160-4.5 MCG/ACT inhaler, Inhale 2 puffs into the lungs 2 (two) times daily as needed (asthma)., Disp: , Rfl:    Cholecalciferol (VITAMIN D3) 25 MCG (1000 UT) CAPS, Take 1 capsule (1,000 Units total) by mouth daily., Disp: 90 capsule, Rfl: 0   cyclobenzaprine (FLEXERIL) 10 MG tablet, TAKE 1 TABLET BY MOUTH THREE TIMES A DAY AS NEEDED FOR MUSCLE SPASM, Disp: 90 tablet, Rfl: 3   DULoxetine (CYMBALTA) 60 MG capsule, Take 1 capsule (60 mg total) by mouth daily., Disp: 30 capsule, Rfl: 1   ergocalciferol (VITAMIN D2) 1.25 MG (50000 UT) capsule, Take 50,000 Units by mouth every Wednesday., Disp: , Rfl:    famotidine (PEPCID) 40 MG tablet, TAKE 1 TABLET BY MOUTH EVERYDAY AT BEDTIME, Disp: 90 tablet, Rfl: 3   FARXIGA 10 MG TABS tablet, Take 10 mg by mouth daily., Disp: , Rfl:    fenofibrate 160 MG tablet, Take 160 mg by mouth daily., Disp: , Rfl:    ferrous sulfate 325 (65 FE) MG tablet, Take 325 mg by mouth daily with breakfast., Disp: , Rfl:    furosemide (LASIX) 20 MG tablet, TAKE 1 TABLET (20 MG TOTAL) BY MOUTH DAILY. PLEASE  CONTACT OFFICE FOR APPOINTMENT NEED OV., Disp: 30 tablet, Rfl: 0   glucose blood (ONETOUCH VERIO) test strip, CHECK SUGARS 4 TIMES A DAY DX E11.9, Disp: , Rfl:    insulin aspart (NOVOLOG FLEXPEN) 100 UNIT/ML FlexPen, INJECT 3 TIMES A DAY PER SLIDING SCALE UP TO 30 UNITS A DAY DX E11.9, Disp: 3 mL, Rfl: 5   Insulin Pen Needle (B-D UF III MINI PEN NEEDLES) 31G X 5 MM MISC, CHECK THREE TIMES DAILY DX E11.9, Disp: , Rfl:    Insulin Pen Needle (B-D UF III MINI PEN NEEDLES) 31G X 5 MM MISC, CHECK THREE TIMES DAILY DX E11.9, Disp: , Rfl:    Insulin Syringe-Needle U-100 31G X 5/16" 0.3 ML MISC, CHECK THREE TIMES DAILY DX E11.9, Disp: , Rfl:    Lancets (ONETOUCH DELICA PLUS [id number]) MISC, Apply topically., Disp: , Rfl:    levocetirizine (XYZAL) 5 MG tablet, Take 1 tablet (5 mg total) by mouth every evening., Disp: 90 tablet, Rfl: 3   lisinopril (PRINIVIL,ZESTRIL) 20  MG tablet, TAKE 1 TABLET (20 MG TOTAL) BY MOUTH DAILY. (Patient taking differently: Take 20 mg by mouth in the morning and at bedtime.), Disp: 90 tablet, Rfl: 3   metoCLOPramide (REGLAN) 10 MG tablet, Take 1 tablet (10 mg total) by mouth every 6 (six) hours as needed. for nausea, Disp: 60 tablet, Rfl: 0   metoprolol tartrate (LOPRESSOR) 25 MG tablet, TAKE 1 TABLET BY MOUTH TWICE A DAY, Disp: 180 tablet, Rfl: 1   mupirocin ointment (BACTROBAN) 2 %, Apply 1 Application topically 2 (two) times daily., Disp: 30 g, Rfl: 2   nitroGLYCERIN (NITROSTAT) 0.4 MG SL tablet, Place 1 tablet (0.4 mg total) under the tongue every 5 (five) minutes as needed for chest pain., Disp: 20 tablet, Rfl: 3   omeprazole (PRILOSEC) 40 MG capsule, TAKE 1 CAPSULE BY MOUTH EVERY DAY, Disp: 90 capsule, Rfl: 1   ONETOUCH VERIO test strip, 4 (four) times daily., Disp: , Rfl:    pravastatin (PRAVACHOL) 40 MG tablet, TAKE 1 TABLET BY MOUTH DAILY. PLEASE KEEP UPCOMING APPT IN JANUARY 2023, Disp: 90 tablet, Rfl: 1   TRESIBA FLEXTOUCH 100 UNIT/ML FlexTouch Pen, INJECT 0.5 ML (50  UNITS TOTAL) UNDER THE SKIN DAILY., Disp: 3 mL, Rfl: 1  Observations/Objective: Patient is well-developed, well-nourished in no acute distress.  Resting comfortably  at home.  Head is normocephalic, atraumatic.  No labored breathing.  Speech is clear and coherent with logical content.  Patient is alert and oriented at baseline.    Assessment and Plan:  1. Viral URI with cough  - benzonatate (TESSALON) 100 MG capsule; Take 1 capsule (100 mg total) by mouth 3 (three) times daily as needed for cough.  Dispense: 20 capsule; Refill: 0  -Take meds as prescribed -Rest -Mucinex  -Use a cool mist humidifier especially during the winter months when heat dries out the air. - Use saline nose sprays frequently to help soothe nasal passages and promote drainage. -Saline irrigations of the nose can be very helpful if done frequently.             * 4X daily for 1 week*             * Use of a nettie pot can be helpful with this.  *Follow directions with this* *Boiled or distilled water only -stay hydrated by drinking plenty of fluids - Keep thermostat turn down low to prevent drying out sinuses - For any cough or congestion- robitussin DM or Delsym as needed - For fever or aches or pains- take tylenol or ibuprofen as directed on bottle             * for fevers greater than 101 orally you may alternate ibuprofen and tylenol every 3 hours.  If you do not improve you will need a follow up visit in person.                Reviewed side effects, risks and benefits of medication.    Patient acknowledged agreement and understanding of the plan.   Past Medical, Surgical, Social History, Allergies, and Medications have been Reviewed.    Follow Up Instructions: I discussed the assessment and treatment plan with the patient. The patient was provided an opportunity to ask questions and all were answered. The patient agreed with the plan and demonstrated an understanding of the instructions.  A copy of  instructions were sent to the patient via MyChart unless otherwise noted below.     The patient was advised to call  back or seek an in-person evaluation if the symptoms worsen or if the condition fails to improve as anticipated.  Time:  I spent 10 minutes with the patient via telehealth technology discussing the above problems/concerns.    [name], NP

## 2022-05-17 ENCOUNTER — Encounter (INDEPENDENT_AMBULATORY_CARE_PROVIDER_SITE_OTHER): Payer: Medicare HMO | Admitting: Ophthalmology

## 2022-05-18 ENCOUNTER — Encounter (INDEPENDENT_AMBULATORY_CARE_PROVIDER_SITE_OTHER): Payer: Medicare HMO | Admitting: Ophthalmology

## 2022-05-18 DIAGNOSIS — I10 Essential (primary) hypertension: Secondary | ICD-10-CM

## 2022-05-18 DIAGNOSIS — E113513 Type 2 diabetes mellitus with proliferative diabetic retinopathy with macular edema, bilateral: Secondary | ICD-10-CM | POA: Diagnosis not present

## 2022-05-18 DIAGNOSIS — H35033 Hypertensive retinopathy, bilateral: Secondary | ICD-10-CM | POA: Diagnosis not present

## 2022-05-20 ENCOUNTER — Other Ambulatory Visit: Payer: Self-pay | Admitting: Nurse Practitioner

## 2022-05-20 DIAGNOSIS — F330 Major depressive disorder, recurrent, mild: Secondary | ICD-10-CM

## 2022-05-22 ENCOUNTER — Encounter: Payer: Self-pay | Admitting: Neurology

## 2022-05-22 ENCOUNTER — Ambulatory Visit (INDEPENDENT_AMBULATORY_CARE_PROVIDER_SITE_OTHER): Payer: Medicare HMO | Admitting: Neurology

## 2022-05-22 VITALS — BP 131/64 | HR 66 | Ht 66.0 in | Wt 190.0 lb

## 2022-05-22 DIAGNOSIS — G4719 Other hypersomnia: Secondary | ICD-10-CM

## 2022-05-22 DIAGNOSIS — G4733 Obstructive sleep apnea (adult) (pediatric): Secondary | ICD-10-CM

## 2022-05-22 DIAGNOSIS — Z951 Presence of aortocoronary bypass graft: Secondary | ICD-10-CM | POA: Diagnosis not present

## 2022-05-22 DIAGNOSIS — R519 Headache, unspecified: Secondary | ICD-10-CM

## 2022-05-22 DIAGNOSIS — E669 Obesity, unspecified: Secondary | ICD-10-CM

## 2022-05-22 NOTE — Patient Instructions (Signed)

## 2022-05-22 NOTE — Progress Notes (Signed)
Subjective:    Patient ID: Joseph [name] is a 67 y.o. male.  HPI    Star Age, MD, PhD [facility] [address] [place], [place] [address]  Dear Judson Roch,  I saw your patient, Joseph [name], upon your kind request in my sleep clinic today for initial consultation of his sleep disorder, in particular, concern for underlying obstructive sleep apnea.  The patient is accompanied by his daughter, Regenia Skeeter, today.  As you know, Mr. Fatula is a 67 year old male with an underlying complex medical history of anemia, arthritis, BPH, chronic renal insufficiency, coronary artery disease with history of MI, status post 5 vessel CABG in 2013, depression, anxiety, diabetes, neuropathy, history of gastroparesis, reflux disease, hypertension, hyperlipidemia, neuropathy, PVD, and mild obesity, who was previously diagnosed with obstructive sleep apnea and recommended for CPAP therapy but he has not been on PAP therapy for years.  History is provided by his daughter almost exclusively.  His Epworth sleepiness score is 15 out of 24, fatigue severity score is 57 out of 63. Prior sleep study results are not available for my review today.  Testing was some 20 years ago according to patient's daughter and he has not been on PAP therapy in about 15 years as per daughter's recollection.  I reviewed your telemedicine note from 04/20/2022.  He has had weight loss in the past few years, in the realm of 15 to 20 pounds.  He does not watch TV in his bedroom, bedtime is generally between 10 and midnight and rise time somewhere between 7 and 10 AM.  His daughter reports that he can take multiple naps per day and even prolonged naps for hours.  He lives with his daughter and her boyfriend.  He does not have night to night nocturia, he has occasionally woken up with a headache.  He may take Tylenol for this.  He drinks caffeine in the form of flavored water with green tea flavor, 1 pouch has  about 60 mg of caffeine in it and he may use several pouches per day and 1 large jug.  No pets in the household.  He does not drink any alcohol, he is a non-smoker.  He would be willing to try CPAP therapy again.  His daughter reports that he has had intermittent dizziness and she feels that his attention span is not as good as it used to.  Per daughter, he tried an over-the-counter oral appliance some years ago but could not tolerate it.  His Past Medical History Is Significant For: Past Medical History:  Diagnosis Date   Anemia    Anxiety    Arthritis    BPH (benign prostatic hypertrophy)    Cataract    Chronic renal insufficiency, stage III (moderate) ([place])    [place] kidney   Coronary artery disease    Dr Debara Pickett    Depression    Diabetes mellitus without complication ([place]) [id number]   insulin dependent   Diabetic autonomic neuropathy ([place]) 03/19/2019   Gastric polyps - hyperplastic 03/19/2019   Gastroparesis due to DM ([facility])    GERD (gastroesophageal reflux disease)    Hx of adenomatous colonic polyps 06/11/2014   Hyperlipidemia    Hypertension    Myocardial infarction ([place])    Neuropathy    PVD (peripheral vascular disease) ([place])    Sleep apnea 2010   refuses to use cpap    His Past Surgical History Is Significant For: Past Surgical History:  Procedure Laterality Date  ANKLE FRACTURE SURGERY Right    CARDIAC CATHETERIZATION  01/30/2012   This demonstrated proximal to mid LAD disease and disease of 2 large OM vessels concerning for surgical disease.   COLONOSCOPY     CORONARY ARTERY BYPASS GRAFT  02/06/2012   Procedure: CORONARY ARTERY BYPASS GRAFTING (CABG); 5- Vessel bypass a LIMA to the LAD, a saphenous vein to the to the diagonal , a saphenous vein to the distal OM, and sequential reverse saphenous vien graft to the posterior descending and 2nd OM. Surgeon: Grace Isaac, MD;  Location: [facility];  Service: Open Heart Surgery;  Laterality: [name];   ESOPHAGOGASTRODUODENOSCOPY   2005   LEFT HEART CATHETERIZATION WITH CORONARY ANGIOGRAM [name] 01/30/2012   Procedure: LEFT HEART CATHETERIZATION WITH CORONARY ANGIOGRAM;  Surgeon: Pixie Casino, MD;  Location: [facility] CATH LAB;  Service: Cardiovascular;  Laterality: [name];   teeth removal  08/16/2021   TOOTH EXTRACTION [name] 09/16/2021   Procedure: DENTAL RESTORATION/EXTRACTIONS;  Surgeon: Diona Browner, DMD;  Location: [place];  Service: Oral Surgery;  Laterality: [name];   UPPER GASTROINTESTINAL ENDOSCOPY      His Family History Is Significant For: Family History  Problem Relation Age of Onset   Diabetes Father    Hypertension Father    Cancer Sister        unknown   Colon cancer Neg Hx    Esophageal cancer Neg Hx    Rectal cancer Neg Hx    Stomach cancer Neg Hx    Pancreatic cancer Neg Hx    Sleep apnea Neg Hx     His Social History Is Significant For: Social History   Socioeconomic History   Marital status: Widowed    Spouse name: Not on file   Number of children: 4   Years of education: Not on file   Highest education level: Not on file  Occupational History   Occupation: disabled  Tobacco Use   Smoking status: Never   Smokeless tobacco: Never  Vaping Use   Vaping Use: Never used  Substance and Sexual Activity   Alcohol use: No   Drug use: No   Sexual activity: Not Currently  Other Topics Concern   Not on file  Social History Narrative   Widowed, 1 son and 4 daughters   Disabled and dual eligible Medicare and Medicaid   One caffeinated beverage daily   Social Determinants of Health   Financial Resource Strain: Low Risk  (02/03/2022)   Overall Financial Resource Strain (CARDIA)    Difficulty of Paying Living Expenses: Not hard at all  Food Insecurity: No Food Insecurity (02/03/2022)   Hunger Vital Sign    Worried About Running Out of Food in the Last Year: Never true    [place] in the Last Year: Never true  Transportation Needs: No Transportation Needs (02/03/2022)   PRAPARE -  Hydrologist (Medical): No    Lack of Transportation (Non-Medical): No  Physical Activity: Insufficiently Active (02/03/2022)   Exercise Vital Sign    Days of Exercise per Week: 3 days    Minutes of Exercise per Session: 30 min  Stress: No Stress Concern Present (02/03/2022)   [place]    Feeling of Stress : Not at all  Social Connections: Socially Isolated (02/03/2022)   Social Connection and Isolation Panel [NHANES]    Frequency of Communication with Friends and Family: More than three times a week  Frequency of Social Gatherings with Friends and Family: More than three times a week    Attends Religious Services: Never    Marine scientist or Organizations: No    Attends Archivist Meetings: Never    Marital Status: Widowed    His Allergies Are:  Allergies  Allergen Reactions   Metformin Diarrhea        Robaxin [Methocarbamol] Itching and Rash  :   His Current Medications Are:  Outpatient Encounter Medications as of 05/22/2022  Medication Sig   Ascorbic Acid (VITAMIN C PO) Take by mouth.   aspirin EC 81 MG tablet Take 81 mg by mouth daily.   B-D UF III MINI PEN NEEDLES 31G X 5 MM MISC Inject into the skin 3 (three) times daily.   B-D UF III MINI PEN NEEDLES 31G X 5 MM MISC CHECK THREE TIMES DAILY DX E11.9   budesonide-formoterol (SYMBICORT) 160-4.5 MCG/ACT inhaler Inhale 2 puffs into the lungs 2 (two) times daily as needed (asthma).   Cholecalciferol (VITAMIN D3) 25 MCG (1000 UT) CAPS Take 1 capsule (1,000 Units total) by mouth daily.   cyclobenzaprine (FLEXERIL) 10 MG tablet TAKE 1 TABLET BY MOUTH THREE TIMES A DAY AS NEEDED FOR MUSCLE SPASM   DULoxetine (CYMBALTA) 60 MG capsule Take 1 capsule (60 mg total) by mouth daily.   ergocalciferol (VITAMIN D2) 1.25 MG (50000 UT) capsule Take 50,000 Units by mouth every Wednesday.   famotidine (PEPCID) 40 MG tablet TAKE 1  TABLET BY MOUTH EVERYDAY AT BEDTIME   FARXIGA 10 MG TABS tablet Take 10 mg by mouth daily.   fenofibrate 160 MG tablet Take 160 mg by mouth daily.   ferrous sulfate 325 (65 FE) MG tablet Take 325 mg by mouth daily with breakfast.   furosemide (LASIX) 20 MG tablet TAKE 1 TABLET (20 MG TOTAL) BY MOUTH DAILY. PLEASE CONTACT OFFICE FOR APPOINTMENT NEED OV.   glucose blood (ONETOUCH VERIO) test strip CHECK SUGARS 4 TIMES A DAY DX E11.9   insulin aspart (NOVOLOG FLEXPEN) 100 UNIT/ML FlexPen INJECT 3 TIMES A DAY PER SLIDING SCALE UP TO 30 UNITS A DAY DX E11.9   Insulin Pen Needle (B-D UF III MINI PEN NEEDLES) 31G X 5 MM MISC CHECK THREE TIMES DAILY DX E11.9   Insulin Pen Needle (B-D UF III MINI PEN NEEDLES) 31G X 5 MM MISC CHECK THREE TIMES DAILY DX E11.9   Insulin Syringe-Needle U-100 31G X 5/16" 0.3 ML MISC CHECK THREE TIMES DAILY DX E11.9   Lancets (ONETOUCH DELICA PLUS [id number]) MISC Apply topically.   levocetirizine (XYZAL) 5 MG tablet Take 1 tablet (5 mg total) by mouth every evening.   lisinopril (PRINIVIL,ZESTRIL) 20 MG tablet TAKE 1 TABLET (20 MG TOTAL) BY MOUTH DAILY. (Patient taking differently: Take 20 mg by mouth in the morning and at bedtime.)   metoCLOPramide (REGLAN) 10 MG tablet Take 1 tablet (10 mg total) by mouth every 6 (six) hours as needed. for nausea   metoprolol tartrate (LOPRESSOR) 25 MG tablet TAKE 1 TABLET BY MOUTH TWICE A DAY   mupirocin ointment (BACTROBAN) 2 % Apply 1 Application topically 2 (two) times daily.   nitroGLYCERIN (NITROSTAT) 0.4 MG SL tablet Place 1 tablet (0.4 mg total) under the tongue every 5 (five) minutes as needed for chest pain.   omeprazole (PRILOSEC) 40 MG capsule TAKE 1 CAPSULE BY MOUTH EVERY DAY   ONETOUCH VERIO test strip 4 (four) times daily.   pravastatin (PRAVACHOL) 40 MG tablet TAKE 1  TABLET BY MOUTH DAILY. PLEASE KEEP UPCOMING APPT IN JANUARY 2023   TRESIBA FLEXTOUCH 100 UNIT/ML FlexTouch Pen INJECT 0.5 ML (50 UNITS TOTAL) UNDER THE SKIN  DAILY.   ALPRAZolam (XANAX) 0.5 MG tablet Take 1 tablet (0.5 mg total) by mouth 2 (two) times daily as needed for anxiety.   benzonatate (TESSALON) 100 MG capsule Take 1 capsule (100 mg total) by mouth 3 (three) times daily as needed for cough.   No facility-administered encounter medications on file as of 05/22/2022.  :   Review of Systems:  Out of a complete 14 point review of systems, all are reviewed and negative with the exception of these symptoms as listed below:  Review of Systems  Neurological:        Pt here for sleep consult  Pt snores,fatigue,hypertension ,few headaches Pt states sleep study 20 years  Pt states haven't worn CPAP machine  in 15 years     ESS:15 FSS:57    Objective:  Neurological Exam  Physical Exam Physical Examination:   Vitals:   05/22/22 1227  BP: 131/64  Pulse: 66    General Examination: The patient is a very pleasant 67 y.o. male in no acute distress. He appears well-developed and well-nourished and well groomed. He is minimally verbal, looks to his daughter for answers, able to answer questions but uses only few word sentences.  HEENT: Normocephalic, atraumatic, pupils are equal, round and reactive to light, extraocular tracking is good without limitation to gaze excursion or nystagmus noted. Hearing is grossly intact. Face is symmetric with normal facial animation. Speech is clear with no dysarthria noted. There is no hypophonia. There is no lip, neck/head, jaw or voice tremor.  Mild intermittent lip pursing noted, could be mild facial dyskinesias or facial motor tics.  There are no carotid bruits on auscultation. Oropharynx exam reveals: mild mouth dryness, adequate dental hygiene and moderate airway crowding, due to Mallampati class IV, wider uvula and redundant soft palate noted.  Tip of uvula and tonsils not fully visualized.  Neck circumference of 16-1/4 inches.  Minimal overbite noted.  Chest: Clear to auscultation without wheezing, rhonchi  or crackles noted.  Heart: S1+S2+0, regular and normal without murmurs, rubs or gallops noted.   Abdomen: Soft, non-tender and non-distended.  Extremities: There is mild swelling around right ankle, he is wearing a hard right ankle brace, scarring noted distal right lower extremity, normal findings left ankle and distal lower extremity.    Skin: Warm and dry without trophic changes noted.   Musculoskeletal: exam reveals no obvious joint deformities.   Neurologically:  Mental status: The patient is awake, alert and oriented in all 4 spheres. His immediate and remote memory, attention, language skills and fund of knowledge are difficult to assess.  Patient is minimally verbal.  Mood appears to be normal, affect appears to be flat.   Cranial nerves II - XII are as described above under HEENT exam.  Motor exam: Normal bulk, strength and tone is noted. There is no obvious action or resting tremor.  Fine motor skills and coordination: grossly intact.  Cerebellar testing: No dysmetria or intention tremor. There is no truncal or gait ataxia.  Sensory exam: intact to light touch in the upper and lower extremities.  Gait, station and balance: He stands easily. No veering to one side is noted. No leaning to one side is noted. Posture is age-appropriate and stance is narrow based. Gait shows normal stride length and normal pace. No problems turning are noted.  Assessment and Plan:   In summary, Aaliyah I Igou is a very pleasant 67 y.o.-year old male with an underlying complex medical history of anemia, arthritis, BPH, chronic renal insufficiency, coronary artery disease with history of MI, status post 5 vessel CABG in 2013, depression, anxiety, diabetes, neuropathy, history of gastroparesis, reflux disease, hypertension, hyperlipidemia, neuropathy, PVD, and mild obesity, who presents for evaluation of his prior diagnosis of obstructive sleep apnea (OSA).   I had a long chat with the patient and his  daughter about my findings and the diagnosis of sleep apnea , particularly OSA, its prognosis and treatment options. We talked about medical/conservative treatments, surgical interventions and non-pharmacological approaches for symptom control. I explained, in particular, the risks and ramifications of untreated moderate to severe OSA, especially with respect to developing cardiovascular disease down the road, including congestive heart failure (CHF), difficult to treat hypertension, cardiac arrhythmias (particularly A-fib), neurovascular complications including TIA, stroke and dementia. Even type 2 diabetes has, in part, been linked to untreated OSA. Symptoms of untreated OSA may include (but may not be limited to) daytime sleepiness, nocturia (i.e. frequent nighttime urination), memory problems, mood irritability and suboptimally controlled or worsening mood disorder such as depression and/or anxiety, lack of energy, lack of motivation, physical discomfort, as well as recurrent headaches, especially morning or nocturnal headaches. We talked about the importance of maintaining a healthy lifestyle and striving for healthy weight. I recommended a sleep study at this time. I outlined the differences between a laboratory attended sleep study which is considered more comprehensive and accurate over the option of a home sleep test (HST); the latter may lead to underestimation of sleep disordered breathing in some instances and does not help with diagnosing upper airway resistance syndrome and is not accurate enough to diagnose primary central sleep apnea typically. I outlined possible surgical and non-surgical treatment options of OSA, including the use of a positive airway pressure (PAP) device (i.e. CPAP, AutoPAP/APAP or BiPAP in certain circumstances), a custom-made dental device (aka oral appliance, which would require a referral to a specialist dentist or orthodontist typically, and is generally speaking not  considered for patients with full dentures or edentulous state), upper airway surgical options, such as traditional UPPP (which is not considered a first-line treatment) or the Inspire device (hypoglossal nerve stimulator, which would involve a referral for consultation with an ENT surgeon, after careful selection, following inclusion criteria - also not first-line treatment). I explained the PAP treatment option to the patient in detail, as this is generally considered first-line treatment.  The patient indicated that he would be willing to try PAP therapy, if the need arises. I explained the importance of being compliant with PAP treatment, not only for insurance purposes but primarily to improve patient's symptoms symptoms, and for the patient's long term health benefit, including to reduce His cardiovascular risks longer-term.    We will pick up our discussion about the next steps and treatment options after testing.  We will keep them posted as to the test results by phone call and/or MyChart messaging where possible.  We will plan to follow-up in sleep clinic accordingly as well.  I answered all their questions today and the patient and his daughter were in agreement.   I encouraged them to call with any interim questions, concerns, problems or updates or email [place] through [place].  Generally speaking, sleep test authorizations may take up to 2 weeks, sometimes less, sometimes longer, the patient is encouraged to get in touch with [place]  if they do not hear back from the sleep lab staff directly within the next 2 weeks.  Thank you very much for allowing me to participate in the care of this nice patient. If I can be of any further assistance to you please do not hesitate to call me at [phone].  Sincerely,   Star Age, MD, PhD

## 2022-05-24 ENCOUNTER — Encounter: Payer: Self-pay | Admitting: Nurse Practitioner

## 2022-05-25 ENCOUNTER — Other Ambulatory Visit: Payer: Self-pay | Admitting: Emergency Medicine

## 2022-05-25 MED ORDER — LISINOPRIL 40 MG PO TABS: 40.0000 mg | ORAL_TABLET | Freq: Every day | ORAL | 0 refills | Status: DC

## 2022-05-25 NOTE — Telephone Encounter (Signed)
He is transitioning care to another physician. Lisinopril prescription for 14 days sent to pharmacy of record today.  Thanks.

## 2022-05-26 ENCOUNTER — Encounter (INDEPENDENT_AMBULATORY_CARE_PROVIDER_SITE_OTHER): Payer: Medicare HMO | Admitting: Ophthalmology

## 2022-05-26 DIAGNOSIS — E113513 Type 2 diabetes mellitus with proliferative diabetic retinopathy with macular edema, bilateral: Secondary | ICD-10-CM | POA: Diagnosis not present

## 2022-05-28 ENCOUNTER — Other Ambulatory Visit: Payer: Self-pay | Admitting: Emergency Medicine

## 2022-05-30 ENCOUNTER — Ambulatory Visit: Payer: Medicare HMO | Attending: Physician Assistant | Admitting: Physician Assistant

## 2022-05-30 ENCOUNTER — Encounter: Payer: Self-pay | Admitting: Physician Assistant

## 2022-05-30 VITALS — BP 110/80 | HR 58 | Ht 66.0 in | Wt 187.2 lb

## 2022-05-30 DIAGNOSIS — E782 Mixed hyperlipidemia: Secondary | ICD-10-CM

## 2022-05-30 DIAGNOSIS — I2510 Atherosclerotic heart disease of native coronary artery without angina pectoris: Secondary | ICD-10-CM | POA: Diagnosis not present

## 2022-05-30 DIAGNOSIS — E1159 Type 2 diabetes mellitus with other circulatory complications: Secondary | ICD-10-CM | POA: Diagnosis not present

## 2022-05-30 DIAGNOSIS — I152 Hypertension secondary to endocrine disorders: Secondary | ICD-10-CM

## 2022-05-30 DIAGNOSIS — I951 Orthostatic hypotension: Secondary | ICD-10-CM

## 2022-05-30 MED ORDER — FUROSEMIDE 20 MG PO TABS: 20.0000 mg | ORAL_TABLET | Freq: Every day | ORAL | 3 refills | Status: DC | PRN

## 2022-05-30 NOTE — Patient Instructions (Signed)
Medication Instructions:  1.Stop daily lasix and only take as needed for edema *If you need a refill on your cardiac medications before your next appointment, please call your pharmacy*   Lab Work: CMET and CBC today If you have labs (blood work) drawn today and your tests are completely normal, you will receive your results only by: [place] (if you have MyChart) OR A paper copy in the mail If you have any lab test that is abnormal or we need to change your treatment, we will call you to review the results.   Follow-Up: At [facility], you and your health needs are our priority.  As part of our continuing mission to provide you with exceptional heart care, we have created designated Provider Care Teams.  These Care Teams include your primary Cardiologist (physician) and Advanced Practice Providers (APPs -  Physician Assistants and Nurse Practitioners) who all work together to provide you with the care you need, when you need it.  Your next appointment:   3 month(s)  Provider:   Pixie Casino, MD    Other Instructions Wear knee high compression stockings every day

## 2022-05-30 NOTE — Assessment & Plan Note (Signed)
History of CABG in 2013.  Myoview in March 2023 was low risk.  Electrocardiogram does not demonstrate any acute changes.  He is not having anginal symptoms.  Continue aspirin 81 mg daily, pravastatin 40 mg daily.  Follow-up in 3 months.

## 2022-05-30 NOTE — Assessment & Plan Note (Addendum)
Blood pressure is overall well-controlled.  As noted, I am stopping his Lasix to help prevent orthostatic hypotension.  Continue lisinopril 40 mg daily, metoprolol tartrate 25 mg twice daily.

## 2022-05-30 NOTE — Progress Notes (Signed)
Cardiology Office Note:    Date:  05/30/2022  ID:  Joseph [name], DOB 1955-06-30, MRN [id number] [place] Providers Cardiologist:  Pixie Casino, MD      Patient Profile:   Coronary artery disease S/p CABG in 2013 TTE 02/18/2018: EF 60-65, GR 2 DD Myoview 06/03/2021: Diaphragmatic attenuation, no ischemia, EF 66, low risk Diabetes mellitus on insulin Hyperlipidemia  Hypertension  Chronic kidney disease  Obesity Peripheral arterial disease      History of Present Illness:   Joseph [name] is a 67 y.o. male who returns for evaluation of low blood pressure, dizziness.  He was last seen by Dr. Debara Pickett 05/23/2021.  He noted dyspnea with exertion.  Follow-up Myoview was low risk.  He is here today with his daughter.  She is a Chartered certified accountant in the emergency room.  She noticed that he has been stumbling after getting up from a seated position.  He complains of dizziness when he stands up.  She checked orthostatic vital signs at home.  His blood pressure increased from 149/73 lying to 98/56 with standing.  He has not had any dizziness or lightheadedness while at rest.  He has not had palpitations.  He has not had chest pain, shortness of breath, syncope, orthopnea, leg edema.  He has been on [place] for about a year.  His nephrologist put him on this.  He is not sure why he is on furosemide.  He denies a history of heart failure.  Review of Systems  Constitutional: Negative for fever.  Respiratory:  Negative for cough.   Hematologic/Lymphatic: Negative for bleeding problem.  Gastrointestinal:  Positive for constipation. Negative for diarrhea, hematochezia, melena and vomiting.  Genitourinary:  Negative for hematuria.       Studies Reviewed:    EKG: Sinus bradycardia, HR 58, left axis deviation, RSR prime in V1, nonspecific ST-T wave changes, QTc 473, no change since prior tracing  Risk Assessment/Calculations:             Physical Exam:   VS:  BP 110/80   Pulse (!) 58   Ht  '5\' 6"'$  (1.676 m)   Wt 187 lb 3.2 oz (84.9 kg)   SpO2 97%   BMI 30.21 kg/m    Wt Readings from Last 3 Encounters:  05/30/22 187 lb 3.2 oz (84.9 kg)  05/22/22 190 lb (86.2 kg)  03/14/22 194 lb 4 oz (88.1 kg)    Constitutional:      Appearance: Healthy appearance. Not in distress.  Neck:     Vascular: JVD normal.  Pulmonary:     Breath sounds: Normal breath sounds. No wheezing. No rales.  Cardiovascular:     Normal rate. Regular rhythm. Normal S1. Normal S2.      Murmurs: There is no murmur.  Edema:    Peripheral edema absent.  Abdominal:     Palpations: Abdomen is soft.       ASSESSMENT AND PLAN:   Orthostatic hypotension As noted, his daughter is a Chartered certified accountant in the emergency room.  She was able to outline orthostatic hypotension with orthostatic vital signs at home.  He has not had any symptoms at rest.  His brother died recently.  He has been somewhat depressed by this.  He is not eating well.  I suspect he is somewhat volume deplete in the setting of poor appetite, Farxiga and furosemide therapy.  Obtain CMET, CBC today.  Recommend knee-high compression hose.  Prescription given.  Discontinue Lasix.  He  can take this as needed for swelling.  We discussed getting up slowly.  He already sleeps on an incline which should help reduce drops in his blood pressure.  I have recommended he discuss depression and poor appetite with primary care.  Follow-up with cardiology in 3 months.  Hypertension associated with diabetes ([place]) Blood pressure is overall well-controlled.  As noted, I am stopping his Lasix to help prevent orthostatic hypotension.  Continue lisinopril 40 mg daily, metoprolol tartrate 25 mg twice daily.  Hyperlipidemia Labs reviewed from [facility].  His LDL in May 2023 was optimal at 46.  Continue fenofibrate 160 mg daily, pravastatin 40 mg daily.  Atherosclerotic heart disease of native coronary artery without angina pectoris History of CABG in 2013.  Myoview in March 2023 was  low risk.  Electrocardiogram does not demonstrate any acute changes.  He is not having anginal symptoms.  Continue aspirin 81 mg daily, pravastatin 40 mg daily.  Follow-up in 3 months.        Dispo:  Return in about 3 months (around 08/30/2022) for Routine Follow Up with Dr. Debara Pickett. Signed, Richardson Dopp, PA-C

## 2022-05-30 NOTE — Assessment & Plan Note (Addendum)
As noted, his daughter is a Chartered certified accountant in the emergency room.  She was able to outline orthostatic hypotension with orthostatic vital signs at home.  He has not had any symptoms at rest.  His brother died recently.  He has been somewhat depressed by this.  He is not eating well.  I suspect he is somewhat volume deplete in the setting of poor appetite, Farxiga and furosemide therapy.  Obtain CMET, CBC today.  Recommend knee-high compression hose.  Prescription given.  Discontinue Lasix.  He can take this as needed for swelling.  We discussed getting up slowly.  He already sleeps on an incline which should help reduce drops in his blood pressure.  I have recommended he discuss depression and poor appetite with primary care.  Follow-up with cardiology in 3 months.

## 2022-05-30 NOTE — Assessment & Plan Note (Signed)
Labs reviewed from [facility].  His LDL in May 2023 was optimal at 46.  Continue fenofibrate 160 mg daily, pravastatin 40 mg daily.

## 2022-05-31 ENCOUNTER — Telehealth: Payer: Self-pay | Admitting: *Deleted

## 2022-05-31 ENCOUNTER — Encounter (INDEPENDENT_AMBULATORY_CARE_PROVIDER_SITE_OTHER): Payer: Medicare HMO | Admitting: Ophthalmology

## 2022-05-31 DIAGNOSIS — E113513 Type 2 diabetes mellitus with proliferative diabetic retinopathy with macular edema, bilateral: Secondary | ICD-10-CM

## 2022-05-31 DIAGNOSIS — H35033 Hypertensive retinopathy, bilateral: Secondary | ICD-10-CM

## 2022-05-31 DIAGNOSIS — Z79899 Other long term (current) drug therapy: Secondary | ICD-10-CM

## 2022-05-31 DIAGNOSIS — I10 Essential (primary) hypertension: Secondary | ICD-10-CM

## 2022-05-31 LAB — CBC
Hematocrit: 41.4 % (ref 37.5–51.0)
Hemoglobin: 13.9 g/dL (ref 13.0–17.7)
MCH: 28.2 pg (ref 26.6–33.0)
MCHC: 33.6 g/dL (ref 31.5–35.7)
MCV: 84 fL (ref 79–97)
Platelets: 307 10*3/uL (ref 150–450)
RBC: 4.93 x10E6/uL (ref 4.14–5.80)
RDW: 12.7 % (ref 11.6–15.4)
WBC: 7 10*3/uL (ref 3.4–10.8)

## 2022-05-31 LAB — COMPREHENSIVE METABOLIC PANEL
ALT: 16 IU/L (ref 0–44)
AST: 23 IU/L (ref 0–40)
Albumin/Globulin Ratio: 1.9 (ref 1.2–2.2)
Albumin: 4.8 g/dL (ref 3.9–4.9)
Alkaline Phosphatase: 29 IU/L — ABNORMAL LOW (ref 44–121)
BUN/Creatinine Ratio: 15 (ref 10–24)
BUN: 31 mg/dL — ABNORMAL HIGH (ref 8–27)
Bilirubin Total: 0.3 mg/dL (ref 0.0–1.2)
CO2: 24 mmol/L (ref 20–29)
Calcium: 9.8 mg/dL (ref 8.6–10.2)
Chloride: 100 mmol/L (ref 96–106)
Creatinine, Ser: 2.04 mg/dL — ABNORMAL HIGH (ref 0.76–1.27)
Globulin, Total: 2.5 g/dL (ref 1.5–4.5)
Glucose: 142 mg/dL — ABNORMAL HIGH (ref 70–99)
Potassium: 4.6 mmol/L (ref 3.5–5.2)
Sodium: 143 mmol/L (ref 134–144)
Total Protein: 7.3 g/dL (ref 6.0–8.5)
eGFR: 35 mL/min/{1.73_m2} — ABNORMAL LOW (ref >59)

## 2022-05-31 NOTE — Telephone Encounter (Signed)
-----   Message from Liliane Shi, PA-C sent at 05/31/2022  9:20 AM EST ----- Creatinine increased.  K+, LFTs, Hgb normal Lasix was stopped yesterday at office visit.  This should help improve creatinine. PLAN:  -Continue current medications -Hydrate well with water -Repeat BMET 2 weeks Richardson Dopp, PA-C    05/31/2022 9:17 AM

## 2022-06-02 ENCOUNTER — Ambulatory Visit (INDEPENDENT_AMBULATORY_CARE_PROVIDER_SITE_OTHER): Payer: Medicare HMO | Admitting: Nurse Practitioner

## 2022-06-02 VITALS — BP 138/68 | HR 84 | Temp 97.6°F | Ht 66.0 in | Wt 188.2 lb

## 2022-06-02 DIAGNOSIS — F330 Major depressive disorder, recurrent, mild: Secondary | ICD-10-CM

## 2022-06-02 DIAGNOSIS — K5900 Constipation, unspecified: Secondary | ICD-10-CM | POA: Diagnosis not present

## 2022-06-02 DIAGNOSIS — I951 Orthostatic hypotension: Secondary | ICD-10-CM

## 2022-06-02 DIAGNOSIS — R6889 Other general symptoms and signs: Secondary | ICD-10-CM

## 2022-06-02 DIAGNOSIS — R634 Abnormal weight loss: Secondary | ICD-10-CM

## 2022-06-02 LAB — PSA: PSA: 0.22 ng/mL (ref 0.10–4.00)

## 2022-06-02 NOTE — Patient Instructions (Signed)
Nepro - nutritional supplement

## 2022-06-02 NOTE — Progress Notes (Signed)
Established Patient Office Visit  Subjective   Patient ID: Joseph [name], male    DOB: [birth year]  Age: 67 y.o. MRN: [id number]  Chief Complaint  Patient presents with   Medical Management of Chronic Issues    Follow up, Getting dizzy when he stand up    Memory Loss    For about 6 month, it progressing worse to the point forgetting his medications     Estimated Creatinine Clearance: 36 mL/min (A) (by C-G formula based on SCr of 2.04 mg/dL (H)).  Patient is accompanied by daughter today.  Orthostatic hypotension: Saw cardiology about 2 days ago.  Furosemide was discontinued and patient was told to only take as needed for leg swelling.  Metabolic panel showed worsening kidney function.  Patient has follow-up with nephrologist in upcoming May.  Is scheduled to follow-up with cardiology as well in a couple weeks to recheck metabolic panel.  Patient reports dizziness slightly better than it was 2 days ago. Daughter brings in orthostatic vital signs that she collected on patient as she works in healthcare: 149/73, 141/74, 98/59, 107/64  Constipation: Has had chronic issues with constipation.  Daughter and patient report sometimes she will go more than a week between bowel movements.  Has tried prunes, prune juice, milk of magnesia, and Linzess without consistent improvement in symptoms.  Up-to-date with last colonoscopy.  Depression/unintentional weight loss/forgetfulness: Daughter is very concerned that her father has been losing weight unintentionally as well as being more forgetful and more depressed.  Patient recently had a sibling who passed away which triggered all of these concerns.  Currently patient is on Cymbalta is tolerating well.  Denies suicidal ideation today.  Has lost 14 pounds unintentionally over the last 10 months (7% change).  Recent labs have shown slightly worsened kidney function, no anemia, A1c of 6.3, TSH has been normal.  Patient denies shortness of breath, cough, or  history of smoking.    ROS: See HPI    Objective:     BP 138/68   Pulse 84   Temp 97.6 F (36.4 C) (Temporal)   Ht '5\' 6"'$  (1.676 m)   Wt 188 lb 4 oz (85.4 kg)   SpO2 99%   BMI 30.38 kg/m  BP Readings from Last 3 Encounters:  06/02/22 138/68  05/30/22 110/80  05/22/22 131/64   Wt Readings from Last 3 Encounters:  06/02/22 188 lb 4 oz (85.4 kg)  05/30/22 187 lb 3.2 oz (84.9 kg)  05/22/22 190 lb (86.2 kg)      Physical Exam Vitals reviewed.  Constitutional:      Appearance: Normal appearance.  HENT:     Head: Normocephalic and atraumatic.  Cardiovascular:     Rate and Rhythm: Normal rate and regular rhythm.  Pulmonary:     Effort: Pulmonary effort is normal.     Breath sounds: Normal breath sounds.  Musculoskeletal:     Cervical back: Neck supple.  Skin:    General: Skin is warm and dry.  Neurological:     Mental Status: He is alert and oriented to person, place, and time.  Psychiatric:        Mood and Affect: Mood normal.        Behavior: Behavior normal.        Thought Content: Thought content normal.        Judgment: Judgment normal.      Results for orders placed or performed in visit on 06/02/22  PSA  Result Value Ref Range  PSA 0.22 0.10 - 4.00 ng/mL      The ASCVD Risk score (Arnett DK, et al., 2019) failed to calculate for the following reasons:   The patient has a prior MI or stroke diagnosis    Assessment & Plan:   Problem List Items Addressed This Visit       Cardiovascular and Mediastinum   Orthostatic hypotension    Being followed by cardiologist.  Dizziness slightly improved since stopping furosemide.  Patient encouraged to follow-up with cardiology as scheduled, and to call them if symptoms worsen.        Other   Major depressive disorder, recurrent, mild (HCC)    Chronic, referral to psychiatry made today for assistance with management.      Relevant Orders   Ambulatory referral to Psychiatry   Constipation - Primary     Chronic, has tried and failed multiple treatment options.  Referral to GI made today for further assistance with managing.      Relevant Orders   Ambulatory referral to Gastroenterology   Forgetfulness    Per daughter is getting progressively worse.  Referral to neurology today made for assistance with evaluation and management.      Relevant Orders   Ambulatory referral to Neurology   Unintentional weight loss    So far labs have not identified etiology.  Will check PSA and hep C for further evaluation.  Also will refer to GI for further evaluation.  Encourage patient to eat 3 meals a day and possibly add Nepro supplementation as well.  Follow-up with PCP in 1 month for close monitoring.      Relevant Orders   PSA (Completed)   Hepatitis C antibody    Return for follow-up as scheduled.  I spent 31 minutes on encounter today including face-to-face evaluation, review previous records, and development and discussion of treatment plan.   Ailene Ards, NP

## 2022-06-02 NOTE — Assessment & Plan Note (Signed)
Chronic, referral to psychiatry made today for assistance with management.

## 2022-06-02 NOTE — Assessment & Plan Note (Signed)
Being followed by cardiologist.  Dizziness slightly improved since stopping furosemide.  Patient encouraged to follow-up with cardiology as scheduled, and to call them if symptoms worsen.

## 2022-06-02 NOTE — Assessment & Plan Note (Signed)
Chronic, has tried and failed multiple treatment options.  Referral to GI made today for further assistance with managing.

## 2022-06-02 NOTE — Assessment & Plan Note (Signed)
Per daughter is getting progressively worse.  Referral to neurology today made for assistance with evaluation and management.

## 2022-06-02 NOTE — Assessment & Plan Note (Signed)
So far labs have not identified etiology.  Will check PSA and hep C for further evaluation.  Also will refer to GI for further evaluation.  Encourage patient to eat 3 meals a day and possibly add Nepro supplementation as well.  Follow-up with PCP in 1 month for close monitoring.

## 2022-06-04 LAB — HEPATITIS C ANTIBODY: Hepatitis C Ab: NONREACTIVE

## 2022-06-07 ENCOUNTER — Other Ambulatory Visit: Payer: Self-pay | Admitting: Emergency Medicine

## 2022-06-08 ENCOUNTER — Other Ambulatory Visit: Payer: Self-pay | Admitting: Emergency Medicine

## 2022-06-08 ENCOUNTER — Telehealth: Payer: Self-pay | Admitting: Neurology

## 2022-06-08 NOTE — Telephone Encounter (Signed)
[employer] pending uploaded notes on the portal  

## 2022-06-09 MED ORDER — LISINOPRIL 40 MG PO TABS: 40.0000 mg | ORAL_TABLET | Freq: Every day | ORAL | 0 refills | Status: DC

## 2022-06-12 ENCOUNTER — Ambulatory Visit: Payer: Medicare HMO | Attending: Physician Assistant

## 2022-06-12 DIAGNOSIS — Z79899 Other long term (current) drug therapy: Secondary | ICD-10-CM

## 2022-06-13 ENCOUNTER — Other Ambulatory Visit: Payer: Medicare HMO

## 2022-06-13 LAB — BASIC METABOLIC PANEL
BUN/Creatinine Ratio: 12 (ref 10–24)
BUN: 20 mg/dL (ref 8–27)
CO2: 20 mmol/L (ref 20–29)
Calcium: 9.7 mg/dL (ref 8.6–10.2)
Chloride: 102 mmol/L (ref 96–106)
Creatinine, Ser: 1.65 mg/dL — ABNORMAL HIGH (ref 0.76–1.27)
Glucose: 145 mg/dL — ABNORMAL HIGH (ref 70–99)
Potassium: 4.6 mmol/L (ref 3.5–5.2)
Sodium: 142 mmol/L (ref 134–144)
eGFR: 45 mL/min/{1.73_m2} — ABNORMAL LOW (ref >59)

## 2022-06-15 ENCOUNTER — Encounter: Payer: Self-pay | Admitting: Physician Assistant

## 2022-06-16 ENCOUNTER — Encounter: Payer: Self-pay | Admitting: Physician Assistant

## 2022-06-16 ENCOUNTER — Ambulatory Visit (INDEPENDENT_AMBULATORY_CARE_PROVIDER_SITE_OTHER): Payer: Medicare HMO | Admitting: Physician Assistant

## 2022-06-16 VITALS — BP 160/84 | HR 85 | Resp 18 | Ht 66.0 in | Wt 189.0 lb

## 2022-06-16 DIAGNOSIS — R413 Other amnesia: Secondary | ICD-10-CM

## 2022-06-16 NOTE — Progress Notes (Cosign Needed)
Assessment/Plan:   Joseph [name] is a very pleasant 67 y.o. year old RH male originally from [place] but fluent in [place] with a history of major depression, CKD, DM 2, vitamin D deficiency, hypertension, recent orthostatic hypotension, hyperlipidemia, seen today for evaluation of memory loss. MoCA today is 20/30 however, the patient was easily distracted and kept looking at his daughter for answers (his daughter admits that he always lets everyone else do that reading, the writing and other activities for him, which may have affected his score.  Based on this information, neuropsychological evaluation would not be indicated).  Workup is in progress.  Etiology of memory loss is unclear  Memory Impairment  MRI brain without contrast to assess for underlying structural abnormality and assess vascular load   Monitor driving Agree with sleep studies for OSA Increase physical activity Continue to control mood as per PCP, agree with Psychiatry evaluation for Depression in view of recent deaths in the family.  Recommend that psychiatry evaluates attention issues as well. Will hold plans for antidementia medication until further testing is complete.  Folllow up in 1  month   Subjective:   The patient is accompanied by his daughter who supplements the history.   How long did patient have memory difficulties? For the past 8 months, worse over the last 4-5 months. Patient has some difficulty remembering recent conversations and people names.  His daughter noticed that he is more distracted than before, I am he forgets to take his medications which is not usual behavior for him.  He had a recent death in the family a few months back, and he is memory may have been worse since.  repeats oneself?  Endorsed Disoriented when walking into a room?  Patient denies   Leaving objects in unusual places? Denies   Wandering behavior?  Denies Any personality changes since last visit? Daughter reports that "he  is more down, not as conversational as before" Any history of depression?:  Endorsed for several decades, years ago he had a therapist but there were some issues of  broken confidentiality, and the patient did not return.  Depression is worse since recent loss of his sibling, and he has been referred to psychiatry by his PCP.  Currently he is on Cymbalta and a referral to psych has been made. Hallucinations or paranoia?  Patient denies   Seizures?   Patient denies    Any sleep changes?  He does not sleep well vivid dreams, REM behavior or sleepwalking   Sleep apnea?  He has a history of OSA not on CPAP, resulting in being somnolent during the day.  Any hygiene concerns?  Patient denies   Independent of bathing and dressing?  Endorsed  Does the patient needs help with medications?  He was in charge until recently, but lately a few times he has forgotten to take them so his daughter is monitoring them Who is in charge of the finances?   Patient is in charge, auto-pay Any changes in appetite?  Decreased, has lost weight unintentionally, being evaluated by PCP and GI.  He began to take Nepro lately.  Admits to not drinking enough water  Patient have trouble swallowing? denies   Does the patient cook?  No Any kitchen accidents such as leaving the stove on? denies   Any headaches?  Denies Chronic back pain Endorsed, has DJD and did PT for a while.  Ambulates with difficulty?  He has a history of back pain and DJD, as  well as a history of left foot and ankle fusion, which causes discomfort and decreased mobility, he is to do physical therapy or join  the [facility]  Recent falls or head injuries? Had a couple of head injuries 10 y ago hitting the coffee table with LOC.  Vision changes? Denies. He has a history of cataract surgery/glaucoma on injections Unilateral weakness, numbness or tingling? denies   Any tremors?   denies   Any anosmia?  denies   Any incontinence of urine? Denies  Any bowel dysfunction?   Chronic constipation to be followed by GI  Patient lives with daughter  History of heavy alcohol intake? Quit in 2002  History of heavy tobacco use?  denies   Family history of dementia? denies  Does patient drive? Yes, daughter days he is not a good driver due to his reaction time, such as not seeing a car coming on time.   Pertinent labs 2024, A1c 6.3, TSH 3.67, B12 666 CBC normal Past Medical History:  Diagnosis Date   Anemia    Anxiety    Arthritis    BPH (benign prostatic hypertrophy)    Cataract    Chronic renal insufficiency, stage III (moderate) ([place])    [place] [facility]   Coronary artery disease    Dr Debara Pickett    Depression    Diabetes mellitus without complication ([place]) [id number]   insulin dependent   Diabetic autonomic neuropathy ([place]) 03/19/2019   Gastric polyps - hyperplastic 03/19/2019   Gastroparesis due to DM (HCC)    GERD (gastroesophageal reflux disease)    Hx of adenomatous colonic polyps 06/11/2014   Hyperlipidemia    Hypertension    Myocardial infarction ([place])    Neuropathy    PVD (peripheral vascular disease) ([place])    Sleep apnea 2010   refuses to use cpap     Past Surgical History:  Procedure Laterality Date   ANKLE FRACTURE SURGERY Right    CARDIAC CATHETERIZATION  01/30/2012   This demonstrated proximal to mid LAD disease and disease of 2 large OM vessels concerning for surgical disease.   COLONOSCOPY     CORONARY ARTERY BYPASS GRAFT  02/06/2012   Procedure: CORONARY ARTERY BYPASS GRAFTING (CABG); 5- Vessel bypass a LIMA to the LAD, a saphenous vein to the to the diagonal , a saphenous vein to the distal OM, and sequential reverse saphenous vien graft to the posterior descending and 2nd OM. Surgeon: Grace Isaac, MD;  Location: [facility];  Service: Open Heart Surgery;  Laterality: [name];   ESOPHAGOGASTRODUODENOSCOPY  2005   LEFT HEART CATHETERIZATION WITH CORONARY ANGIOGRAM [name] 01/30/2012   Procedure: LEFT HEART CATHETERIZATION WITH CORONARY ANGIOGRAM;   Surgeon: Pixie Casino, MD;  Location: [facility] CATH LAB;  Service: Cardiovascular;  Laterality: [name];   teeth removal  08/16/2021   TOOTH EXTRACTION [name] 09/16/2021   Procedure: DENTAL RESTORATION/EXTRACTIONS;  Surgeon: Diona Browner, DMD;  Location: [facility];  Service: Oral Surgery;  Laterality: [name];   UPPER GASTROINTESTINAL ENDOSCOPY       Allergies  Allergen Reactions   Metformin Diarrhea and Other (See Comments)    diarrhea   Robaxin [Methocarbamol] Itching and Rash    Current Outpatient Medications  Medication Instructions   Ascorbic Acid (VITAMIN C PO) Oral   aspirin EC 81 mg, Oral, Daily   B-D UF III MINI PEN NEEDLES 31G X 5 MM MISC Subcutaneous, 3 times daily   B-D UF III MINI PEN NEEDLES 31G X 5 MM MISC CHECK THREE TIMES DAILY  DX E11.9   budesonide-formoterol (SYMBICORT) 160-4.5 MCG/ACT inhaler 2 puffs, Inhalation, 2 times daily PRN   cyclobenzaprine (FLEXERIL) 10 MG tablet TAKE 1 TABLET BY MOUTH THREE TIMES A DAY AS NEEDED FOR MUSCLE SPASM   DULoxetine (CYMBALTA) 60 mg, Oral, Daily   ergocalciferol (VITAMIN D2) 50,000 Units, Oral, Every Wed   famotidine (PEPCID) 40 MG tablet TAKE 1 TABLET BY MOUTH EVERYDAY AT BEDTIME   Farxiga 10 mg, Oral, Daily   fenofibrate 160 mg, Oral, Daily   ferrous sulfate 325 mg, Oral, Daily with breakfast   furosemide (LASIX) 20 mg, Oral, Daily PRN   glucose blood (ONETOUCH VERIO) test strip CHECK SUGARS 4 TIMES A DAY DX E11.9   insulin aspart (NOVOLOG FLEXPEN) 100 UNIT/ML FlexPen INJECT 3 TIMES A DAY PER SLIDING SCALE UP TO 30 UNITS A DAY DX E11.9   Insulin Pen Needle (B-D UF III MINI PEN NEEDLES) 31G X 5 MM MISC CHECK THREE TIMES DAILY DX E11.9   Insulin Pen Needle (B-D UF III MINI PEN NEEDLES) 31G X 5 MM MISC CHECK THREE TIMES DAILY DX E11.9   Insulin Syringe-Needle U-100 31G X 5/16" 0.3 ML MISC CHECK THREE TIMES DAILY DX E11.9   Lancets (ONETOUCH DELICA PLUS [id number]) MISC Topical   levocetirizine (XYZAL) 5 mg, Oral, Every evening   lisinopril  (ZESTRIL) 40 mg, Oral, Daily   metoCLOPramide (REGLAN) 10 mg, Oral, Every 6 hours PRN, for nausea   metoprolol tartrate (LOPRESSOR) 25 mg, Oral, 2 times daily   mupirocin ointment (BACTROBAN) 2 % 1 Application, Topical, 2 times daily   nitroGLYCERIN (NITROSTAT) 0.4 mg, Sublingual, Every 5 min PRN   omeprazole (PRILOSEC) 40 MG capsule Oral, Daily   ONETOUCH VERIO test strip 4 times daily   pravastatin (PRAVACHOL) 40 MG tablet TAKE 1 TABLET BY MOUTH DAILY. PLEASE KEEP UPCOMING APPT IN JANUARY 2023   TRESIBA FLEXTOUCH 100 UNIT/ML FlexTouch Pen INJECT 0.5 ML (50 UNITS TOTAL) UNDER THE SKIN DAILY.   Vitamin D3 1,000 Units, Oral, Daily     VITALS:   Vitals:   06/16/22 0801  BP: (!) 160/84  Pulse: 85  Resp: 18  SpO2: 99%  Weight: 189 lb (85.7 kg)  Height: 5\' 6"  (1.676 m)      06/02/2022    1:52 PM 04/20/2022    4:44 PM 03/14/2022    1:29 PM 02/07/2022    1:12 PM 02/03/2022   11:33 AM  Depression screen PHQ 2/9  Decreased Interest 3 3 3  0 0  Down, Depressed, Hopeless 3 3 3  0 1  PHQ - 2 Score 6 6 6  0 1  Altered sleeping 0 3 3    Tired, decreased energy 0 2 3    Change in appetite 2 1 3     Feeling bad or failure about yourself  0 2 1    Trouble concentrating 0 2 3    Moving slowly or fidgety/restless 0 2 0    Suicidal thoughts 0 0 0    PHQ-9 Score 8 18 19     Difficult doing work/chores Somewhat difficult Somewhat difficult       PHYSICAL EXAM   HEENT:  Normocephalic, atraumatic. The mucous membranes are moist. The superficial temporal arteries are without ropiness or tenderness. Cardiovascular: Regular rate and rhythm. Lungs: Clear to auscultation bilaterally. Neck: There are no carotid bruits noted bilaterally.  NEUROLOGICAL:    06/16/2022    9:00 AM  Montreal Cognitive Assessment   Visuospatial/ Executive (0/5) 1  Naming (0/3) 3  Attention: Read list of digits (0/2) 1  Attention: Read list of letters (0/1) 1  Attention: Serial 7 subtraction starting at 100 (0/3) 3   Language: Repeat phrase (0/2) 1  Language : Fluency (0/1) 0  Abstraction (0/2) 1  Delayed Recall (0/5) 3  Orientation (0/6) 5  Total 19  Adjusted Score (based on education) 19        No data to display           Orientation:  Alert and oriented to person, place and time. No aphasia or dysarthria. Fund of knowledge is appropriate. Recent memory impaired and remote memory unremarkable.  Attention and concentration are decreased.  Able to name objects and repeat phrases. Delayed recall 3/5 cranial nerves: There is good facial symmetry. Extraocular muscles are intact and visual fields are full to confrontational testing. Speech is fluent and clear. No tongue deviation. Hearing is intact to conversational tone. Tone: Tone is good throughout. Sensation: Sensation is intact to light touch and pinprick throughout. Vibration is intact at the bilateral big toe. There is no sensory dermatomal level identified. Coordination: The patient has no difficulty with RAM's or FNF bilaterally. Normal finger to nose  Motor: Strength is 5/5 in the bilateral upper and lower extremities. There is no pronator drift. There are no fasciculations noted. DTR's: Deep tendon reflexes are 2/4 at the bilateral biceps, triceps, brachioradialis, patella and achilles.  Plantar responses are downgoing bilaterally. Gait and Station: The patient is able to ambulate without difficulty.The patient is able to heel toe walk without any difficulty.The patient is able to ambulate in a tandem fashion. The patient is able to stand in the Romberg position.     Thank you for allowing [place] the opportunity to participate in the care of this nice patient. Please do not hesitate to contact us for any questions or concerns.   Total time spent on today's visit was 60 minutes dedicated to this patient today, preparing to see patient, examining the patient, ordering tests and/or medications and counseling the patient, documenting clinical  information in the EHR or other health record, independently interpreting results and communicating results to the patient/family, discussing treatment and goals, answering patient's questions and coordinating care.  Cc:  Horald Pollen, MD  Sharene Butters 06/16/2022 9:17 AM

## 2022-06-16 NOTE — Patient Instructions (Signed)
  MRI brain without contrast to assess for underlying structural abnormality and assess vascular load   Monitor driving Agree with sleep studies for OSA Increase physical activity Continue to control mood as per PCP, agree with Psychiatry evaluation for Depression  Follow up in May

## 2022-06-17 ENCOUNTER — Other Ambulatory Visit: Payer: Self-pay | Admitting: Internal Medicine

## 2022-06-17 ENCOUNTER — Other Ambulatory Visit: Payer: Self-pay | Admitting: Emergency Medicine

## 2022-06-26 ENCOUNTER — Encounter (INDEPENDENT_AMBULATORY_CARE_PROVIDER_SITE_OTHER): Payer: Medicare HMO | Admitting: Ophthalmology

## 2022-06-26 DIAGNOSIS — H43813 Vitreous degeneration, bilateral: Secondary | ICD-10-CM

## 2022-06-26 DIAGNOSIS — H35033 Hypertensive retinopathy, bilateral: Secondary | ICD-10-CM

## 2022-06-26 DIAGNOSIS — I10 Essential (primary) hypertension: Secondary | ICD-10-CM

## 2022-06-26 DIAGNOSIS — E113313 Type 2 diabetes mellitus with moderate nonproliferative diabetic retinopathy with macular edema, bilateral: Secondary | ICD-10-CM

## 2022-06-27 ENCOUNTER — Other Ambulatory Visit: Payer: Self-pay | Admitting: Emergency Medicine

## 2022-06-30 ENCOUNTER — Other Ambulatory Visit: Payer: Self-pay | Admitting: Emergency Medicine

## 2022-06-30 ENCOUNTER — Encounter: Payer: Self-pay | Admitting: Nurse Practitioner

## 2022-07-02 ENCOUNTER — Other Ambulatory Visit: Payer: Self-pay | Admitting: Emergency Medicine

## 2022-07-03 ENCOUNTER — Other Ambulatory Visit: Payer: Self-pay | Admitting: *Deleted

## 2022-07-03 DIAGNOSIS — F330 Major depressive disorder, recurrent, mild: Secondary | ICD-10-CM

## 2022-07-03 MED ORDER — DULOXETINE HCL 60 MG PO CPEP: 60.0000 mg | ORAL_CAPSULE | Freq: Every day | ORAL | 1 refills | Status: DC

## 2022-07-05 NOTE — Telephone Encounter (Signed)
4/10:LVM-MA  06/12/22 Orpah Clinton auth: [id number] (exp. 06/08/22 to 12/08/22)

## 2022-07-07 ENCOUNTER — Encounter: Payer: Self-pay | Admitting: Physician Assistant

## 2022-07-08 ENCOUNTER — Ambulatory Visit
Admission: RE | Admit: 2022-07-08 | Discharge: 2022-07-08 | Disposition: A | Discharge status: Home / Self Care | Payer: Medicare HMO | Source: Ambulatory Visit | Attending: Physician Assistant | Admitting: Physician Assistant

## 2022-07-09 ENCOUNTER — Other Ambulatory Visit: Payer: Self-pay | Admitting: Emergency Medicine

## 2022-07-10 MED ORDER — LISINOPRIL 40 MG PO TABS: 40.0000 mg | ORAL_TABLET | Freq: Every day | ORAL | 0 refills | Status: DC

## 2022-07-12 NOTE — Progress Notes (Signed)
MRI of the brain is normal. Thanks

## 2022-07-14 ENCOUNTER — Other Ambulatory Visit: Payer: Self-pay | Admitting: Nurse Practitioner

## 2022-07-14 DIAGNOSIS — F330 Major depressive disorder, recurrent, mild: Secondary | ICD-10-CM

## 2022-07-19 ENCOUNTER — Ambulatory Visit: Payer: Medicare HMO | Admitting: Physician Assistant

## 2022-07-26 ENCOUNTER — Encounter (INDEPENDENT_AMBULATORY_CARE_PROVIDER_SITE_OTHER): Payer: Medicare HMO | Admitting: Ophthalmology

## 2022-07-26 ENCOUNTER — Encounter: Payer: Self-pay | Admitting: Nurse Practitioner

## 2022-07-26 ENCOUNTER — Other Ambulatory Visit: Payer: Self-pay | Admitting: Emergency Medicine

## 2022-07-26 DIAGNOSIS — E113513 Type 2 diabetes mellitus with proliferative diabetic retinopathy with macular edema, bilateral: Secondary | ICD-10-CM | POA: Diagnosis not present

## 2022-07-26 DIAGNOSIS — I10 Essential (primary) hypertension: Secondary | ICD-10-CM | POA: Diagnosis not present

## 2022-07-26 DIAGNOSIS — Z794 Long term (current) use of insulin: Secondary | ICD-10-CM

## 2022-07-26 DIAGNOSIS — H35031 Hypertensive retinopathy, right eye: Secondary | ICD-10-CM | POA: Diagnosis not present

## 2022-07-27 MED ORDER — VITAMIN D3 25 MCG (1000 UT) PO CAPS: 1000.0000 [IU] | ORAL_CAPSULE | Freq: Every day | ORAL | 0 refills | Status: AC

## 2022-07-27 MED ORDER — FENOFIBRATE 160 MG PO TABS: 160.0000 mg | ORAL_TABLET | Freq: Every day | ORAL | 0 refills | Status: DC

## 2022-08-01 ENCOUNTER — Ambulatory Visit (HOSPITAL_COMMUNITY): Payer: No Typology Code available for payment source | Admitting: Psychiatry

## 2022-08-08 ENCOUNTER — Ambulatory Visit: Payer: Medicare HMO | Admitting: Emergency Medicine

## 2022-08-14 ENCOUNTER — Ambulatory Visit (INDEPENDENT_AMBULATORY_CARE_PROVIDER_SITE_OTHER): Payer: Medicare HMO | Admitting: Physician Assistant

## 2022-08-14 ENCOUNTER — Encounter: Payer: Self-pay | Admitting: Physician Assistant

## 2022-08-14 ENCOUNTER — Ambulatory Visit: Payer: Medicare HMO | Admitting: Emergency Medicine

## 2022-08-14 VITALS — BP 124/60 | HR 63 | Ht 66.0 in | Wt 187.0 lb

## 2022-08-14 DIAGNOSIS — R413 Other amnesia: Secondary | ICD-10-CM | POA: Diagnosis not present

## 2022-08-14 NOTE — Progress Notes (Signed)
Assessment/Plan:   Memory difficulties of unclear etiology  Joseph [name] is a very pleasant 67 y.o. RH male with a history of major depression, CKD, DM 2, vitamin D deficiency, hypertension, recent orthostatic hypotension, hyperlipidemia  seen today in follow up to discuss the MRI of the brain results. These were personally reviewed, with normal findings, no acute abnormalities. Prior MoCa was 20/30 but during the testing was easily distracted, poor effort. Neuropsych evaluation would not be able to be performed.  Patient has not been using his CPAP, although this has been recommended by sleep medicine, and has not yet had evaluation by psych.  In view of normal MRI of the brain, the likelihood of the etiology of memory difficulties could be influenced by OSA and depression.   Follow up as needed. Continue CPAP use. Follow up  OSA  with Dr. Roxy Horseman Continue to control mood as per PCP. Recommend Psychiatry for Major Depression and recommend grievance counseling.  Recommend good control of cardiovascular risk factors  Initial visit 06/16/22  How long did patient have memory difficulties? For the past 8 months, worse over the last 4-5 months. Patient has some difficulty remembering recent conversations and people names.  His daughter noticed that he is more distracted than before, I am he forgets to take his medications which is not usual behavior for him.  He had a recent death in the family a few months back, and he is memory may have been worse since.  repeats oneself?  Endorsed Disoriented when walking into a room?  Patient denies   Leaving objects in unusual places? Denies   Wandering behavior?  Denies Any personality changes since last visit? Daughter reports that "he is more down, not as conversational as before" Any history of depression?:  Endorsed for several decades, years ago he had a therapist but there were some issues of  broken confidentiality, and the patient did not return.   Depression is worse since recent loss of his sibling, and he has been referred to psychiatry by his PCP.  Currently he is on Cymbalta and a referral to psych has been made. Hallucinations or paranoia?  Patient denies   Seizures?   Patient denies    Any sleep changes?  He does not sleep well vivid dreams, REM behavior or sleepwalking   Sleep apnea?  He has a history of OSA not on CPAP, resulting in being somnolent during the day.  Any hygiene concerns?  Patient denies   Independent of bathing and dressing?  Endorsed  Does the patient needs help with medications?  He was in charge until recently, but lately a few times he has forgotten to take them so his daughter is monitoring them Who is in charge of the finances?   Patient is in charge, auto-pay Any changes in appetite?  Decreased, has lost weight unintentionally, being evaluated by PCP and GI.  He began to take Nepro lately.  Admits to not drinking enough water  Patient have trouble swallowing? denies   Does the patient cook?  No Any kitchen accidents such as leaving the stove on? denies   Any headaches?  Denies Chronic back pain Endorsed, has DJD and did PT for a while.  Ambulates with difficulty?  He has a history of back pain and DJD, as well as a history of left foot and ankle fusion, which causes discomfort and decreased mobility, he is to do physical therapy or join  [facility]  Recent falls or head injuries? Had  a couple of head injuries 10 y ago hitting the coffee table with LOC.  Vision changes? Denies. He has a history of cataract surgery/glaucoma on injections Unilateral weakness, numbness or tingling? denies   Any tremors?   denies   Any anosmia?  denies   Any incontinence of urine? Denies  Any bowel dysfunction?  Chronic constipation to be followed by GI  Patient lives with daughter  History of heavy alcohol intake? Quit in 2002  History of heavy tobacco use?  denies   Family history of dementia? denies  Does patient drive? Yes,  daughter days he is not a good driver due to his reaction time, such as not seeing a car coming on time.   Pertinent labs 2024, A1c 6.3, TSH 3.67, B12 666 CBC normal   CURRENT MEDICATIONS:  Outpatient Encounter Medications as of 08/14/2022  Medication Sig   Ascorbic Acid (VITAMIN C PO) Take by mouth.   aspirin EC 81 MG tablet Take 81 mg by mouth daily.   B-D UF III MINI PEN NEEDLES 31G X 5 MM MISC Inject into the skin 3 (three) times daily.   B-D UF III MINI PEN NEEDLES 31G X 5 MM MISC CHECK THREE TIMES DAILY DX E11.9   budesonide-formoterol (SYMBICORT) 160-4.5 MCG/ACT inhaler Inhale 2 puffs into the lungs 2 (two) times daily as needed (asthma).   Cholecalciferol (VITAMIN D3) 25 MCG (1000 UT) CAPS Take 1 capsule (1,000 Units total) by mouth daily. Follow-up appt due w/ Dr. Alvy Bimler must see provider for future refills   cyclobenzaprine (FLEXERIL) 10 MG tablet TAKE 1 TABLET BY MOUTH THREE TIMES A DAY AS NEEDED FOR MUSCLE SPASM   DULoxetine (CYMBALTA) 60 MG capsule Take 1 capsule (60 mg total) by mouth daily.   ergocalciferol (VITAMIN D2) 1.25 MG (50000 UT) capsule Take 50,000 Units by mouth every Wednesday.   famotidine (PEPCID) 40 MG tablet TAKE 1 TABLET BY MOUTH EVERYDAY AT BEDTIME   FARXIGA 10 MG TABS tablet Take 10 mg by mouth daily.   fenofibrate 160 MG tablet Take 1 tablet (160 mg total) by mouth daily. Follow-up appt due w/ Dr. Alvy Bimler  must see provider for future refills   ferrous sulfate 325 (65 FE) MG tablet Take 325 mg by mouth daily with breakfast.   furosemide (LASIX) 20 MG tablet Take 1 tablet (20 mg total) by mouth daily as needed for edema.   glucose blood (ONETOUCH VERIO) test strip CHECK SUGARS 4 TIMES A DAY DX E11.9   insulin aspart (NOVOLOG FLEXPEN) 100 UNIT/ML FlexPen INJECT 3 TIMES A DAY PER SLIDING SCALE UP TO 30 UNITS A DAY DX E11.9   Insulin Pen Needle (B-D UF III MINI PEN NEEDLES) 31G X 5 MM MISC CHECK THREE TIMES DAILY DX E11.9   Insulin Pen Needle (B-D UF III MINI  PEN NEEDLES) 31G X 5 MM MISC CHECK THREE TIMES DAILY DX E11.9   Insulin Syringe-Needle U-100 31G X 5/16" 0.3 ML MISC CHECK THREE TIMES DAILY DX E11.9   Lancets (ONETOUCH DELICA PLUS LANCET33G) MISC Apply topically.   levocetirizine (XYZAL) 5 MG tablet Take 1 tablet (5 mg total) by mouth every evening.   lisinopril (ZESTRIL) 40 MG tablet Take 1 tablet (40 mg total) by mouth daily.   metoCLOPramide (REGLAN) 10 MG tablet Take 1 tablet (10 mg total) by mouth every 6 (six) hours as needed. for nausea   metoprolol tartrate (LOPRESSOR) 25 MG tablet TAKE 1 TABLET BY MOUTH TWICE A DAY   mupirocin ointment (BACTROBAN)  2 % Apply 1 Application topically 2 (two) times daily.   nitroGLYCERIN (NITROSTAT) 0.4 MG SL tablet Place 1 tablet (0.4 mg total) under the tongue every 5 (five) minutes as needed for chest pain.   omeprazole (PRILOSEC) 40 MG capsule TAKE 1 CAPSULE BY MOUTH EVERY DAY   ONETOUCH VERIO test strip 4 (four) times daily.   pravastatin (PRAVACHOL) 40 MG tablet TAKE 1 TABLET BY MOUTH DAILY. PLEASE KEEP UPCOMING APPT IN JANUARY 2023   TRESIBA FLEXTOUCH 100 UNIT/ML FlexTouch Pen INJECT 0.5 ML (50 UNITS TOTAL) UNDER THE SKIN DAILY.   No facility-administered encounter medications on file as of 08/14/2022.        No data to display            06/16/2022   11:00 AM 06/16/2022    9:00 AM  Montreal Cognitive Assessment   Visuospatial/ Executive (0/5) 1 1  Naming (0/3) 3 3  Attention: Read list of digits (0/2) 1 1  Attention: Read list of letters (0/1) 1 1  Attention: Serial 7 subtraction starting at 100 (0/3) 3 3  Language: Repeat phrase (0/2) 1 1  Language : Fluency (0/1) 0 0  Abstraction (0/2) 1 1  Delayed Recall (0/5) 3 3  Orientation (0/6) 5 5  Total 19 19  Adjusted Score (based on education) 20 19   Thank you for allowing [place] the opportunity to participate in the care of this nice patient. Please do not hesitate to contact us for any questions or concerns.   Total time spent on  today's visit was 30 minutes dedicated to this patient today, preparing to see patient, examining the patient, ordering tests and/or medications and counseling the patient, documenting clinical information in the EHR or other health record, independently interpreting results and communicating results to the patient/family, discussing treatment and goals, answering patient's questions and coordinating care.  Cc:  Georgina Quint, MD  Marlowe Kays 08/14/2022 6:25 AM

## 2022-08-14 NOTE — Patient Instructions (Signed)
Follow up as needed

## 2022-08-15 ENCOUNTER — Encounter: Payer: Self-pay | Admitting: Internal Medicine

## 2022-08-15 ENCOUNTER — Ambulatory Visit: Payer: Medicare HMO | Attending: Internal Medicine | Admitting: Internal Medicine

## 2022-08-15 VITALS — BP 144/82 | HR 58 | Ht 66.0 in | Wt 187.0 lb

## 2022-08-15 DIAGNOSIS — I25810 Atherosclerosis of coronary artery bypass graft(s) without angina pectoris: Secondary | ICD-10-CM | POA: Diagnosis not present

## 2022-08-15 DIAGNOSIS — I10 Essential (primary) hypertension: Secondary | ICD-10-CM | POA: Diagnosis not present

## 2022-08-15 DIAGNOSIS — E782 Mixed hyperlipidemia: Secondary | ICD-10-CM

## 2022-08-15 DIAGNOSIS — I951 Orthostatic hypotension: Secondary | ICD-10-CM | POA: Diagnosis not present

## 2022-08-15 NOTE — Patient Instructions (Signed)
Medication Instructions:  NO CHANGES  *If you need a refill on your cardiac medications before your next appointment, please call your pharmacy*   Follow-Up: At  [facility], you and your health needs are our priority.  As part of our continuing mission to provide you with exceptional heart care, we have created designated Provider Care Teams.  These Care Teams include your primary Cardiologist (physician) and Advanced Practice Providers (APPs -  Physician Assistants and Nurse Practitioners) who all work together to provide you with the care you need, when you need it.  We recommend signing up for the patient portal called "MyChart".  Sign up information is provided on this After Visit Summary.  MyChart is used to connect with patients for Virtual Visits (Telemedicine).  Patients are able to view lab/test results, encounter notes, upcoming appointments, etc.  Non-urgent messages can be sent to your provider as well.   To learn more about what you can do with MyChart, go to [email or web address].    Your next appointment:    12 months with Dr. [name]  

## 2022-08-15 NOTE — Progress Notes (Addendum)
OFFICE NOTE  Chief Complaint:  Follow-up orthostatic hypotension  Primary Care Physician: Georgina Quint, MD  HPI:  Joseph [name] is a 67 year old gentleman who recently underwent a MET test with a low VO2. I was concerned about multi-vessel coronary disease and then performed cardiac catheterization on him on January 30, 2012. This demonstrated proximal to mid LAD disease and disease of 2 large OM vessels concerning for surgical disease. I talked with Tyrone Sage and he recommended bypass surgery. He did undergo bypass surgery on February 06, 2012 with a 5-vessel bypass, a LIMA to the LAD, a saphenous vein to the diagonal, a saphenous vein to the distal OM, and sequential reverse saphenous vein graft to the posterior descending and 2nd OM. He did well with this, although it was complicated with some postoperative nausea. He has had some shortness of breath, weakness and slow recovery. He has also had some tenderness in the chest wall around the area of the sternum and incision which he was told by Dr. Tyrone Sage could take some time to improve. We have also made some adjustments in his medicines. He is undergoing home occupational therapy by Genevieve Norlander and they are recommending more upper extremity work which I agree with.  Today he continues to complain about tightness across the chest wall. The midline incision does demonstrate a keloid scar, and this may be responsible for his feeling of tightness. It potentially is possible that the sternum was too closely approximated, however Dr. Tyrone Sage did not indicate the event this was a problem. Overall [name] is doing well without complaints of shortness of breath, chest pain, palpitations, or syncope. Does report some mild lower stray swelling, but his weight has generally been the same.  He is followed by Dr. Kathrene Bongo for chronic kidney disease. Finally he reports has been having some problems with what sounds like tinea cruris.  He is  currently taking a topical steroid and prednisone.  [name] returns today and is without complaints. Denies shortness of breath or chest pain. He reports his kidneys have been stable. He has managed to lose a small amount of weight. He says that his diabetes is well controlled. He has not had a cholesterol test in over a year.  [name] returns for follow-up today. He reports he is feeling quite well. He denies any chest pain or shortness of breath. He is noted to sleep a lot by his significant other. He says that since he is disabled he is not as active as he once was. He reported to me that his primary care provider has stopped seeing patients enclosed the clinic. He currently is switching to a new primary care provider. His diabetes is followed by Dr. Talmage Nap in his A1c is in the range of 5-6.  03/09/2016  [name] was seen again today in follow-up. Overall he is again without complaints. Blood pressure is well-controlled. Weight is fairly stable, in fact actually down 8 pounds. He reports pretty good diabetes control. His cholesterol is at goal. He denies any recurrent chest pain. He continues to feel tightness in his chest wall which I think is related to scar tissue, particularly a keloid scar at the chest wall incision. EKG shows sinus bradycardia 54 with moderate voltage criteria for LVH.  05/31/2017  [name] returns today for follow-up.  He is done well over the past year and a half.  He denies any chest pain or worsening shortness of breath but is not very physically active.  Weight has been fairly stable but could be improved.  Blood pressure is at goal today.  He is followed by Dr. Nadyne Coombes for primary care.  We recently received some lab work which was completed in September 2017.  His LDL at that time was 51 hemoglobin A1c of 6.7.  He is coronary artery bypass grafting was in 2013 and he is not had an ischemia evaluation since that time.  02/13/2018  [name] is seen  today for chest pain.  This happened a few weeks ago and he described left central chest pain which radiated to the left arm.  The episode came on rather abruptly and was more intense for 30 minutes to an hour and then gently subsided over the next 24 hours.  Since then he has had no more symptoms.  He was not seen in the ER for this.  He did not have nitroglycerin to take although saw his primary care provider recently who gave him some.  Since then he has had no further chest pain.  Blood pressure is elevated today.  EKG shows sinus bradycardia 56 with moderate voltage criteria for LVH.  EKG performed by his primary apparently did not show any ischemia.  02/13/2019  [name] returns today for follow-up.  I saw him about 6 months ago via virtual visit.  Blood pressure was well controlled.  Recently saw his GI doctor again with good blood pressure control however today in the office it was 194/73.  He says he might have been anxious and did report taking his medicines today.  EKG shows a sinus bradycardia.  He is asymptomatic he denies any chest pain or worsening shortness of breath.  He had labs through his PCP which indicates good control over his diabetes with hemoglobin A1c of 5.7.  He is managed to lose some more weight.  His cholesterol has been at target.  05/23/2021  [name] is seen today in follow-up.  Recently has had some more shortness of breath.  His daughter noted that when taking out his trash or going up a hill he gets more short of breath.  He has had some weight gain now up about 5 to 7 pounds since August 2022.  He is attributed to that.  He could certainly be his bypass graft.  He is now 10 years out from CABG.  Blood pressure was a little elevated today.  He reports better control at home and just took his medicine about 30 minutes ago.  He has not had recent lipids.  He is overdue for follow-up with his PCP.  He reports good glycemic control.  08/15/2022  [name] returns  today for follow-up of orthostatic hypotension.  This is seemingly improved after stopping his diuretic.  He is also recently apparently struggled from some depression.  He denies any chest pain.  He is due for follow-up with his PCP in May.  His last lipids were well-controlled with an LDL of 46 in May 2023.    PMHx:  Past Medical History:  Diagnosis Date   Anemia    Anxiety    Arthritis    BPH (benign prostatic hypertrophy)    Cataract    Chronic renal insufficiency, stage III (moderate) (HCC)    [place] [facility]   Coronary artery disease    Dr Rennis Golden    Depression    Diabetes mellitus without complication (HCC) 1988   insulin dependent   Diabetic autonomic neuropathy (HCC) 03/19/2019   Gastric polyps -  hyperplastic 03/19/2019   Gastroparesis due to DM (HCC)    GERD (gastroesophageal reflux disease)    Hx of adenomatous colonic polyps 06/11/2014   Hyperlipidemia    Hypertension    Myocardial infarction [facility])    Neuropathy    PVD (peripheral vascular disease) (HCC)    Sleep apnea 2010   refuses to use cpap    Past Surgical History:  Procedure Laterality Date   ANKLE FRACTURE SURGERY Right    CARDIAC CATHETERIZATION  01/30/2012   This demonstrated proximal to mid LAD disease and disease of 2 large OM vessels concerning for surgical disease.   COLONOSCOPY     CORONARY ARTERY BYPASS GRAFT  02/06/2012   Procedure: CORONARY ARTERY BYPASS GRAFTING (CABG); 5- Vessel bypass a LIMA to the LAD, a saphenous vein to the to the diagonal , a saphenous vein to the distal OM, and sequential reverse saphenous vien graft to the posterior descending and 2nd OM. Surgeon: Delight Ovens, MD;  Location: [facility] OR;  Service: Open Heart Surgery;  Laterality: [name];   ESOPHAGOGASTRODUODENOSCOPY  2005   LEFT HEART CATHETERIZATION WITH CORONARY ANGIOGRAM [name] 01/30/2012   Procedure: LEFT HEART CATHETERIZATION WITH CORONARY ANGIOGRAM;  Surgeon: Chrystie Nose, MD;  Location: [facility] CATH LAB;  Service:  Cardiovascular;  Laterality: [name];   teeth removal  08/16/2021   TOOTH EXTRACTION [name] 09/16/2021   Procedure: DENTAL RESTORATION/EXTRACTIONS;  Surgeon: Ocie Doyne, DMD;  Location: [facility] OR;  Service: Oral Surgery;  Laterality: [name];   UPPER GASTROINTESTINAL ENDOSCOPY      FAMHx:  Family History  Problem Relation Age of Onset   Diabetes Father    Hypertension Father    Cancer Sister        unknown   Colon cancer Neg Hx    Esophageal cancer Neg Hx    Rectal cancer Neg Hx    Stomach cancer Neg Hx    Pancreatic cancer Neg Hx    Sleep apnea Neg Hx     SOCHx:   reports that he has never smoked. He has never used smokeless tobacco. He reports that he does not drink alcohol and does not use drugs.  ALLERGIES:  Allergies  Allergen Reactions   Metformin Diarrhea and Other (See Comments)    diarrhea   Robaxin [Methocarbamol] Itching and Rash    ROS: Pertinent items noted in HPI and remainder of comprehensive ROS otherwise negative.  HOME MEDS: Current Outpatient Medications  Medication Sig Dispense Refill   Ascorbic Acid (VITAMIN C PO) Take by mouth.     aspirin EC 81 MG tablet Take 81 mg by mouth daily.     B-D UF III MINI PEN NEEDLES 31G X 5 MM MISC Inject into the skin 3 (three) times daily.     B-D UF III MINI PEN NEEDLES 31G X 5 MM MISC CHECK THREE TIMES DAILY DX E11.9 300 each 1   budesonide-formoterol (SYMBICORT) 160-4.5 MCG/ACT inhaler Inhale 2 puffs into the lungs 2 (two) times daily as needed (asthma).     Cholecalciferol (VITAMIN D3) 25 MCG (1000 UT) CAPS Take 1 capsule (1,000 Units total) by mouth daily. Follow-up appt due w/ Dr. Alvy Bimler must see provider for future refills 90 capsule 0   cyclobenzaprine (FLEXERIL) 10 MG tablet TAKE 1 TABLET BY MOUTH THREE TIMES A DAY AS NEEDED FOR MUSCLE SPASM 90 tablet 3   DULoxetine (CYMBALTA) 60 MG capsule Take 1 capsule (60 mg total) by mouth daily. 30 capsule 1   ergocalciferol (VITAMIN D2) 1.25  MG (50000 UT) capsule Take 50,000  Units by mouth every Wednesday.     famotidine (PEPCID) 40 MG tablet TAKE 1 TABLET BY MOUTH EVERYDAY AT BEDTIME 90 tablet 3   FARXIGA 10 MG TABS tablet Take 10 mg by mouth daily.     fenofibrate 160 MG tablet Take 1 tablet (160 mg total) by mouth daily. Follow-up appt due w/ Dr. Alvy Bimler  must see provider for future refills 90 tablet 0   ferrous sulfate 325 (65 FE) MG tablet Take 325 mg by mouth daily with breakfast.     furosemide (LASIX) 20 MG tablet Take 1 tablet (20 mg total) by mouth daily as needed for edema. 45 tablet 3   glucose blood (ONETOUCH VERIO) test strip CHECK SUGARS 4 TIMES A DAY DX E11.9     insulin aspart (NOVOLOG FLEXPEN) 100 UNIT/ML FlexPen INJECT 3 TIMES A DAY PER SLIDING SCALE UP TO 30 UNITS A DAY DX E11.9 3 mL 5   Insulin Pen Needle (B-D UF III MINI PEN NEEDLES) 31G X 5 MM MISC CHECK THREE TIMES DAILY DX E11.9     Insulin Pen Needle (B-D UF III MINI PEN NEEDLES) 31G X 5 MM MISC CHECK THREE TIMES DAILY DX E11.9     Insulin Syringe-Needle U-100 31G X 5/16" 0.3 ML MISC CHECK THREE TIMES DAILY DX E11.9     Lancets (ONETOUCH DELICA PLUS LANCET33G) MISC Apply topically.     levocetirizine (XYZAL) 5 MG tablet Take 1 tablet (5 mg total) by mouth every evening. 90 tablet 3   lisinopril (ZESTRIL) 40 MG tablet Take 1 tablet (40 mg total) by mouth daily. 90 tablet 0   metoCLOPramide (REGLAN) 10 MG tablet Take 1 tablet (10 mg total) by mouth every 6 (six) hours as needed. for nausea 60 tablet 0   metoprolol tartrate (LOPRESSOR) 25 MG tablet TAKE 1 TABLET BY MOUTH TWICE A DAY 180 tablet 1   mupirocin ointment (BACTROBAN) 2 % Apply 1 Application topically 2 (two) times daily. 30 g 2   nitroGLYCERIN (NITROSTAT) 0.4 MG SL tablet Place 1 tablet (0.4 mg total) under the tongue every 5 (five) minutes as needed for chest pain. 20 tablet 3   omeprazole (PRILOSEC) 40 MG capsule TAKE 1 CAPSULE BY MOUTH EVERY DAY 90 capsule 1   ONETOUCH VERIO test strip 4 (four) times daily.     pravastatin  (PRAVACHOL) 40 MG tablet TAKE 1 TABLET BY MOUTH DAILY. PLEASE KEEP UPCOMING APPT IN JANUARY 2023 90 tablet 1   TRESIBA FLEXTOUCH 100 UNIT/ML FlexTouch Pen INJECT 0.5 ML (50 UNITS TOTAL) UNDER THE SKIN DAILY. 3 mL 1   No current facility-administered medications for this visit.    LABS/IMAGING: No results found for this or any previous visit (from the past 48 hour(s)). No results found.  VITALS: BP (!) 144/82   Pulse (!) 58   Ht 5\' 6"  (1.676 m)   Wt 187 lb (84.8 kg)   SpO2 99%   BMI 30.18 kg/m   EXAM: General appearance: alert and no distress Neck: no adenopathy, no carotid bruit, no JVD, supple, symmetrical, trachea midline and thyroid not enlarged, symmetric, no tenderness/mass/nodules Lungs: clear to auscultation bilaterally Heart: regular rate and rhythm, S1, S2 normal, no murmur, click, rub or gallop Abdomen: soft, non-tender; bowel sounds normal; no masses,  no organomegaly Extremities: extremities normal, atraumatic, no cyanosis or edema Pulses: 2+ and symmetric Skin: Hypertrophic scar of the midline sternotomy incision Neurologic: Grossly normal  EKG: Deferred  ASSESSMENT: Orthostatic  hypotension Coronary disease status post 5 vessel bypass in November 2013, with LIMA to LAD, SVG to diagonal, SVG to OM, SVG to posterior descending and SVG to the second OM Insulin-dependent diabetes Dyslipidemia Hypertension Obesity Chronic kidney disease stage II  PLAN: 1.  Mr. Tenerelli has had improvement in his orthostatic hypotension off of diuretics.  He denies any worsening shortness of breath or chest pain symptoms.  Blood pressure has been controlled.  Heart rate remains in the 50s.  I would not recommend any changes in his meds today.  He has had some issues with weakness and decreased gait which might be related to struggles he is having with some depression.  He saw neurology and is scheduled to see a therapist.  He might benefit from a stimulant as well.  Follow-up with  me annually or sooner as necessary.  Chrystie Nose, MD, [facility], FACP  [place]  [facility]  Medical Director of the [facility] Lip[facility]  Cardiovascular Risk Reduction Clinic Diplomate of the American Board of Clinical Lipidology Attending Cardiologist  Direct Dial: [phone]  Fax: [phone]  Website:  [email or web address] Nicely Sahir Tolson 08/15/2022, 10:07 AM

## 2022-08-16 ENCOUNTER — Ambulatory Visit (INDEPENDENT_AMBULATORY_CARE_PROVIDER_SITE_OTHER): Payer: Medicare HMO | Admitting: Emergency Medicine

## 2022-08-16 VITALS — BP 130/80 | HR 55 | Temp 98.0°F | Ht 66.0 in | Wt 185.1 lb

## 2022-08-16 DIAGNOSIS — F330 Major depressive disorder, recurrent, mild: Secondary | ICD-10-CM

## 2022-08-16 DIAGNOSIS — I152 Hypertension secondary to endocrine disorders: Secondary | ICD-10-CM | POA: Diagnosis not present

## 2022-08-16 DIAGNOSIS — E1159 Type 2 diabetes mellitus with other circulatory complications: Secondary | ICD-10-CM | POA: Diagnosis not present

## 2022-08-16 DIAGNOSIS — E1142 Type 2 diabetes mellitus with diabetic polyneuropathy: Secondary | ICD-10-CM

## 2022-08-16 DIAGNOSIS — K2100 Gastro-esophageal reflux disease with esophagitis, without bleeding: Secondary | ICD-10-CM | POA: Diagnosis not present

## 2022-08-16 DIAGNOSIS — I2510 Atherosclerotic heart disease of native coronary artery without angina pectoris: Secondary | ICD-10-CM

## 2022-08-16 DIAGNOSIS — E785 Hyperlipidemia, unspecified: Secondary | ICD-10-CM

## 2022-08-16 DIAGNOSIS — I739 Peripheral vascular disease, unspecified: Secondary | ICD-10-CM

## 2022-08-16 DIAGNOSIS — G4730 Sleep apnea, unspecified: Secondary | ICD-10-CM

## 2022-08-16 DIAGNOSIS — Z7984 Long term (current) use of oral hypoglycemic drugs: Secondary | ICD-10-CM

## 2022-08-16 DIAGNOSIS — N1831 Chronic kidney disease, stage 3a: Secondary | ICD-10-CM | POA: Insufficient documentation

## 2022-08-16 DIAGNOSIS — Z794 Long term (current) use of insulin: Secondary | ICD-10-CM

## 2022-08-16 DIAGNOSIS — E1143 Type 2 diabetes mellitus with diabetic autonomic (poly)neuropathy: Secondary | ICD-10-CM

## 2022-08-16 DIAGNOSIS — E1169 Type 2 diabetes mellitus with other specified complication: Secondary | ICD-10-CM

## 2022-08-16 DIAGNOSIS — F4323 Adjustment disorder with mixed anxiety and depressed mood: Secondary | ICD-10-CM

## 2022-08-16 LAB — LIPID PANEL
Cholesterol: 96 mg/dL (ref 0–200)
HDL: 37.9 mg/dL — ABNORMAL LOW (ref >39.00)
LDL Cholesterol: 43 mg/dL (ref 0–99)
NonHDL: 57.94
Total CHOL/HDL Ratio: 3
Triglycerides: 77 mg/dL (ref 0.0–149.0)
VLDL: 15.4 mg/dL (ref 0.0–40.0)

## 2022-08-16 LAB — MICROALBUMIN / CREATININE URINE RATIO
Creatinine,U: 79 mg/dL
Microalb Creat Ratio: 3 mg/g (ref 0.0–30.0)
Microalb, Ur: 2.4 mg/dL — ABNORMAL HIGH (ref 0.0–1.9)

## 2022-08-16 LAB — POCT GLYCOSYLATED HEMOGLOBIN (HGB A1C): Hemoglobin A1C: 6.3 % — AB (ref 4.0–5.6)

## 2022-08-16 LAB — COMPREHENSIVE METABOLIC PANEL
ALT: 18 U/L (ref 0–53)
AST: 23 U/L (ref 0–37)
Albumin: 4.1 g/dL (ref 3.5–5.2)
Alkaline Phosphatase: 35 U/L — ABNORMAL LOW (ref 39–117)
BUN: 17 mg/dL (ref 6–23)
CO2: 28 mEq/L (ref 19–32)
Calcium: 9.3 mg/dL (ref 8.4–10.5)
Chloride: 103 mEq/L (ref 96–112)
Creatinine, Ser: 1.32 mg/dL (ref 0.40–1.50)
GFR: 55.87 mL/min — ABNORMAL LOW (ref >60.00)
Glucose, Bld: 91 mg/dL (ref 70–99)
Potassium: 4.4 mEq/L (ref 3.5–5.1)
Sodium: 138 mEq/L (ref 135–145)
Total Bilirubin: 0.5 mg/dL (ref 0.2–1.2)
Total Protein: 6.7 g/dL (ref 6.0–8.3)

## 2022-08-16 MED ORDER — TRESIBA FLEXTOUCH 100 UNIT/ML ~~LOC~~ SOPN: 26.0000 [IU] | PEN_INJECTOR | Freq: Every day | SUBCUTANEOUS | 1 refills | Status: DC

## 2022-08-16 NOTE — Assessment & Plan Note (Signed)
Stable.  Continues omeprazole 40 mg daily

## 2022-08-16 NOTE — Assessment & Plan Note (Signed)
Stable.  No longer taking Reglan

## 2022-08-16 NOTE — Assessment & Plan Note (Signed)
Well-controlled hypertension Continue lisinopril 40 mg daily, metoprolol tartrate 25 mg twice a day Well-controlled diabetes with hemoglobin A1c of 6.3 Continue Farxiga 10 mg daily and daily Tresiba insulin 26 units Cardiovascular risks associated with hypertension and diabetes discussed Diet and nutrition discussed Follow-up in 6 months.

## 2022-08-16 NOTE — Assessment & Plan Note (Signed)
No recent anginal episodes Continues daily baby aspirin and beta-blocker with metoprolol tartrate 25 mg twice a day Continues pravastatin 40 mg daily

## 2022-08-16 NOTE — Assessment & Plan Note (Signed)
Stable Continue Cymbalta 60mg daily

## 2022-08-16 NOTE — Assessment & Plan Note (Signed)
Much improved.  Continue Cymbalta 60 mg daily.

## 2022-08-16 NOTE — Assessment & Plan Note (Signed)
Stable chronic condition.  No complications

## 2022-08-16 NOTE — Assessment & Plan Note (Addendum)
Stable chronic conditions Continues pravastatin 40 mg daily and fenofibrate 160 mg daily Lipid profile done today

## 2022-08-16 NOTE — Assessment & Plan Note (Signed)
Stable and well-controlled on CPAP treatment. 

## 2022-08-16 NOTE — Patient Instructions (Signed)

## 2022-08-16 NOTE — Progress Notes (Signed)
Joseph [name] 67 y.o.   Chief Complaint  Patient presents with   Medical Management of Chronic Issues    f/u appt, no concerns     HISTORY OF PRESENT ILLNESS: This is a 67 y.o. male here for 83-month follow-up of chronic medical conditions Accompanied by daughter. Has no complaints or medical concerns today. Lab Results  Component Value Date   HGBA1C 6.3 (A) 02/07/2022   BP Readings from Last 3 Encounters:  08/16/22 138/88  08/15/22 (!) 144/82  08/14/22 124/60   Wt Readings from Last 3 Encounters:  08/16/22 185 lb 2 oz (84 kg)  08/15/22 187 lb (84.8 kg)  08/14/22 187 lb (84.8 kg)     HPI   Prior to Admission medications   Medication Sig Start Date End Date Taking? Authorizing Provider  Ascorbic Acid (VITAMIN C PO) Take by mouth.   Yes [provider]  aspirin EC 81 MG tablet Take 81 mg by mouth daily.   Yes [provider]  B-D UF III MINI PEN NEEDLES 31G X 5 MM MISC Inject into the skin 3 (three) times daily. 12/06/20  Yes [provider]  B-D UF III MINI PEN NEEDLES 31G X 5 MM MISC CHECK THREE TIMES DAILY DX E11.9 12/22/21  Yes Chidubem Chaires, Eilleen Kempf, MD  Cholecalciferol (VITAMIN D3) 25 MCG (1000 UT) CAPS Take 1 capsule (1,000 Units total) by mouth daily. Follow-up appt due w/ Dr. Alvy Bimler must see provider for future refills 07/27/22  Yes Cheri Ayotte, Eilleen Kempf, MD  cyclobenzaprine (FLEXERIL) 10 MG tablet TAKE 1 TABLET BY MOUTH THREE TIMES A DAY AS NEEDED FOR MUSCLE SPASM 04/10/22  Yes Meredith Pel, NP  DULoxetine (CYMBALTA) 60 MG capsule Take 1 capsule (60 mg total) by mouth daily. 07/03/22  Yes Elenore Paddy, NP  ergocalciferol (VITAMIN D2) 1.25 MG (50000 UT) capsule Take 50,000 Units by mouth every Wednesday. 04/09/20  Yes [provider]  famotidine (PEPCID) 40 MG tablet TAKE 1 TABLET BY MOUTH EVERYDAY AT BEDTIME 09/22/21  Yes Iva Boop, MD  FARXIGA 10 MG TABS tablet Take 10 mg by mouth daily. 12/14/20  Yes [provider]  fenofibrate 160 MG tablet Take 1 tablet (160 mg total) by mouth daily. Follow-up appt due w/ Dr. Alvy Bimler  must see provider for future refills 07/27/22  Yes Hussein Macdougal, Eilleen Kempf, MD  ferrous sulfate 325 (65 FE) MG tablet Take 325 mg by mouth daily with breakfast.   Yes [provider]  furosemide (LASIX) 20 MG tablet Take 1 tablet (20 mg total) by mouth daily as needed for edema. 05/30/22  Yes Weaver, Scott T, PA-C  glucose blood (ONETOUCH VERIO) test strip CHECK SUGARS 4 TIMES A DAY DX E11.9 11/26/20  Yes [provider]  insulin aspart (NOVOLOG FLEXPEN) 100 UNIT/ML FlexPen INJECT 3 TIMES A DAY PER SLIDING SCALE UP TO 30 UNITS A DAY DX E11.9 05/09/22  Yes Dalayah Deahl, Eilleen Kempf, MD  Insulin Pen Needle (B-D UF III MINI PEN NEEDLES) 31G X 5 MM MISC CHECK THREE TIMES DAILY DX E11.9 05/10/20  Yes [provider]  Insulin Pen Needle (B-D UF III MINI PEN NEEDLES) 31G X 5 MM MISC CHECK THREE TIMES DAILY DX E11.9 05/10/20  Yes [provider]  Insulin Syringe-Needle U-100 31G X 5/16" 0.3 ML MISC CHECK THREE TIMES DAILY DX E11.9 05/10/20  Yes [provider]  Lancets (ONETOUCH DELICA PLUS LANCET33G) MISC Apply topically. 12/10/20  Yes [provider]  levocetirizine Elita Boone)  5 MG tablet Take 1 tablet (5 mg total) by mouth every evening. 04/03/22  Yes Gerilyn Stargell, Eilleen Kempf, MD  lisinopril (ZESTRIL) 40 MG tablet Take 1 tablet (40 mg total) by mouth daily. 07/10/22  Yes Salome Cozby, Eilleen Kempf, MD  metoprolol tartrate (LOPRESSOR) 25 MG tablet TAKE 1 TABLET BY MOUTH TWICE A DAY 06/17/22  Yes Makia Bossi, Eilleen Kempf, MD  mupirocin ointment (BACTROBAN) 2 % Apply 1 Application topically 2 (two) times daily. 12/22/21  Yes McDonald, Rachelle Hora, DPM  nitroGLYCERIN (NITROSTAT) 0.4 MG SL tablet Place 1 tablet (0.4 mg total) under the tongue every 5 (five) minutes as needed for chest pain. 08/10/20  Yes [name], Lisette Abu, MD  omeprazole (PRILOSEC) 40 MG capsule TAKE 1 CAPSULE BY  MOUTH EVERY DAY 03/22/22  Yes Sotirios Navarro, Eilleen Kempf, MD  [facility] VERIO test strip 4 (four) times daily. 11/26/20  Yes [provider]  pravastatin (PRAVACHOL) 40 MG tablet TAKE 1 TABLET BY MOUTH DAILY. PLEASE KEEP UPCOMING APPT IN JANUARY 2023 06/19/22  Yes [name], Lisette Abu, MD  TRESIBA FLEXTOUCH 100 UNIT/ML FlexTouch Pen INJECT 0.5 ML (50 UNITS TOTAL) UNDER THE SKIN DAILY. 01/17/22  Yes Leatrice Parilla, Eilleen Kempf, MD  budesonide-formoterol [facility]) 160-4.5 MCG/ACT inhaler Inhale 2 puffs into the lungs 2 (two) times daily as needed (asthma). Patient not taking: Reported on 08/16/2022    [provider]  metoCLOPramide (REGLAN) 10 MG tablet Take 1 tablet (10 mg total) by mouth every 6 (six) hours as needed. for nausea Patient not taking: Reported on 08/16/2022 04/12/22   Georgina Quint, MD    Allergies  Allergen Reactions   Metformin Diarrhea and Other (See Comments)    diarrhea   Robaxin [Methocarbamol] Itching and Rash    Patient Active Problem List   Diagnosis Date Noted   Constipation 06/02/2022   Unintentional weight loss 06/02/2022   Fatigue 04/20/2022   Sleep apnea 04/20/2022   Situational mixed anxiety and depressive disorder 03/14/2022   Dyslipidemia associated with type 2 diabetes mellitus (HCC) 08/03/2021   Depression 02/01/2021   Gastro-esophageal reflux disease with esophagitis 02/01/2021   Gastric polyps - hyperplastic 03/19/2019   Diabetic autonomic neuropathy (HCC) 03/19/2019   Benign prostatic hyperplasia 01/16/2019   Chronic low back pain 01/16/2019   Iron deficiency anemia 01/16/2019   Dyslipidemia 03/10/2016   Atherosclerotic heart disease of native coronary artery without angina pectoris 05/03/2015   Diabetic peripheral neuropathy associated with type 2 diabetes mellitus (HCC) 05/03/2015   Right leg injury, sequela 05/03/2015   Enlarged prostate without lower urinary tract symptoms (luts) 04/30/2015   Major depressive disorder, recurrent,  mild (HCC) 04/30/2015   Peripheral vascular disease (HCC) 04/30/2015   Hx of adenomatous colonic polyps 06/11/2014   Hyperlipidemia 02/25/2014   Diabetic gastroparesis (HCC) 02/17/2014   Neuropathy 12/03/2013   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   Presence of aortocoronary bypass graft 02/11/2012   Coronary arteriography abnormal 02/06/2012   Chronic kidney disease, stage 2 (mild) 01/29/2012   Type 1 diabetes mellitus with complication (HCC) 07/27/2007   Hypertension associated with diabetes (HCC) 07/27/2007   GERD 07/27/2007   ARTHRITIS 07/27/2007   NEPHROLITHIASIS, HX OF 07/27/2007    Past Medical History:  Diagnosis Date   Anemia    Anxiety    Arthritis    BPH (benign prostatic hypertrophy)    Cataract    Chronic renal insufficiency, stage III (moderate) (HCC)    [place] [facility]   Coronary artery disease    Dr Rennis Golden    Depression  Diabetes mellitus without complication (HCC) 1988   insulin dependent   Diabetic autonomic neuropathy (HCC) 03/19/2019   Gastric polyps - hyperplastic 03/19/2019   Gastroparesis due to DM (HCC)    GERD (gastroesophageal reflux disease)    Hx of adenomatous colonic polyps 06/11/2014   Hyperlipidemia    Hypertension    Myocardial infarction (HCC)    Neuropathy    PVD (peripheral vascular disease) (HCC)    Sleep apnea 2010   refuses to use cpap    Past Surgical History:  Procedure Laterality Date   ANKLE FRACTURE SURGERY Right    CARDIAC CATHETERIZATION  01/30/2012   This demonstrated proximal to mid LAD disease and disease of 2 large OM vessels concerning for surgical disease.   COLONOSCOPY     CORONARY ARTERY BYPASS GRAFT  02/06/2012   Procedure: CORONARY ARTERY BYPASS GRAFTING (CABG); 5- Vessel bypass a LIMA to the LAD, a saphenous vein to the to the diagonal , a saphenous vein to the distal OM, and sequential reverse saphenous vien graft to the posterior descending and 2nd OM. Surgeon: Delight Ovens, MD;  Location: [facility] OR;  Service: Open  Heart Surgery;  Laterality: [name];   ESOPHAGOGASTRODUODENOSCOPY  2005   LEFT HEART CATHETERIZATION WITH CORONARY ANGIOGRAM [name] 01/30/2012   Procedure: LEFT HEART CATHETERIZATION WITH CORONARY ANGIOGRAM;  Surgeon: Chrystie Nose, MD;  Location[facility];  Service: Cardiovascular;  Laterality: [name];   teeth removal  08/16/2021   TOOTH EXTRACTION [name] 09/16/2021   Procedure: DENTAL RESTORATION/EXTRACTIONS;  Surgeon: Ocie Doyne, DMD;  Location: [facility] OR;  Service: Oral Surgery;  Laterality: [name];   UPPER GASTROINTESTINAL ENDOSCOPY      Social History   Socioeconomic History   Marital status: Widowed    Spouse name: Not on file   Number of children: 4   Years of education: Not on file   Highest education level: 5th grade  Occupational History   Occupation: disabled  Tobacco Use   Smoking status: Never   Smokeless tobacco: Never  Vaping Use   Vaping Use: Never used  Substance and Sexual Activity   Alcohol use: No   Drug use: No   Sexual activity: Not Currently  Other Topics Concern   Not on file  Social History Narrative   Widowed, 1 son and 4 daughters   Disabled and dual eligible Medicare and Medicaid   One caffeinated beverage daily   Social Determinants of Health   Financial Resource Strain: Patient Declined (08/16/2022)   Overall Financial Resource Strain (CARDIA)    Difficulty of Paying Living Expenses: Patient declined  Food Insecurity: Food Insecurity Present (08/16/2022)   Hunger Vital Sign    Worried About Running Out of Food in the Last Year: Often true    Ran Out of Food in the Last Year: Patient declined  Transportation Needs: No Transportation Needs (08/16/2022)   PRAPARE - Administrator, Civil Service (Medical): No    Lack of Transportation (Non-Medical): No  Physical Activity: Unknown (08/16/2022)   Exercise Vital Sign    Days of Exercise per Week: Patient declined    Minutes of Exercise per Session: 30 min  Stress: Stress Concern Present  (08/16/2022)   [employer] of Occupational Health - Occupational Stress Questionnaire    Feeling of Stress : To some extent  Social Connections: Unknown (08/16/2022)   Social Connection and Isolation Panel [NHANES]    Frequency of Communication with Friends and Family: More than three times a week  Frequency of Social Gatherings with Friends and Family: More than three times a week    Attends Religious Services: Patient declined    Active Member of Clubs or Organizations: No    Attends Banker Meetings: Never    Marital Status: Widowed  Intimate Partner Violence: Not At Risk (02/03/2022)   Humiliation, Afraid, Rape, and Kick questionnaire    Fear of Current or Ex-Partner: No    Emotionally Abused: No    Physically Abused: No    Sexually Abused: No    Family History  Problem Relation Age of Onset   Diabetes Father    Hypertension Father    Cancer Sister        unknown   Colon cancer Neg Hx    Esophageal cancer Neg Hx    Rectal cancer Neg Hx    Stomach cancer Neg Hx    Pancreatic cancer Neg Hx    Sleep apnea Neg Hx      Review of Systems  Constitutional: Negative.  Negative for chills and fever.  HENT: Negative.  Negative for congestion and sore throat.   Respiratory: Negative.  Negative for cough and shortness of breath.   Cardiovascular: Negative.  Negative for chest pain and palpitations.  Gastrointestinal:  Negative for abdominal pain, diarrhea, nausea and vomiting.  Genitourinary: Negative.  Negative for dysuria and hematuria.  Skin: Negative.  Negative for rash.  Neurological: Negative.  Negative for dizziness and headaches.  All other systems reviewed and are negative.   Vitals:   08/16/22 1050  BP: 138/88  Pulse: (!) 55  Temp: 98 F (36.7 C)  SpO2: 98%    Physical Exam Vitals reviewed.  Constitutional:      Appearance: Normal appearance.  HENT:     Head: Normocephalic.     Mouth/Throat:     Mouth: Mucous membranes are moist.      Pharynx: Oropharynx is clear.  Eyes:     Extraocular Movements: Extraocular movements intact.     Conjunctiva/sclera: Conjunctivae normal.     Pupils: Pupils are equal, round, and reactive to light.  Cardiovascular:     Rate and Rhythm: Normal rate and regular rhythm.     Pulses: Normal pulses.     Heart sounds: Normal heart sounds.  Pulmonary:     Effort: Pulmonary effort is normal.     Breath sounds: Normal breath sounds.  Abdominal:     Palpations: Abdomen is soft.     Tenderness: There is no abdominal tenderness.  Musculoskeletal:     Cervical back: No tenderness.     Right lower leg: No edema.     Left lower leg: No edema.  Lymphadenopathy:     Cervical: No cervical adenopathy.  Skin:    General: Skin is warm and dry.     Capillary Refill: Capillary refill takes less than 2 seconds.  Neurological:     General: No focal deficit present.     Mental Status: He is alert and oriented to person, place, and time.  Psychiatric:        Mood and Affect: Mood normal.        Behavior: Behavior normal.    Results for orders placed or performed in visit on 08/16/22 (from the past 24 hour(s))  POCT HgB A1C     Status: Abnormal   Collection Time: 08/16/22  3:13 PM  Result Value Ref Range   Hemoglobin A1C 6.3 (A) 4.0 - 5.6 %   HbA1c POC (<> result,  manual entry)     HbA1c, POC (prediabetic range)     HbA1c, POC (controlled diabetic range)       ASSESSMENT & PLAN: A total of 46 minutes was spent with the patient and counseling/coordination of care regarding preparing for this visit, review of most recent office visit notes, review of multiple chronic medical conditions under management, review of all medications, review of most recent blood work results including interpretation of today's hemoglobin A1c, education on nutrition, cardiovascular risks associated with hypertension and diabetes, prognosis, documentation and need for follow-up.  Problem List Items Addressed This Visit        Cardiovascular and Mediastinum   Hypertension associated with diabetes (HCC) - Primary    Well-controlled hypertension Continue lisinopril 40 mg daily, metoprolol tartrate 25 mg twice a day Well-controlled diabetes with hemoglobin A1c of 6.3 Continue Farxiga 10 mg daily and daily Tresiba insulin 26 units Cardiovascular risks associated with hypertension and diabetes discussed Diet and nutrition discussed Follow-up in 6 months.      Relevant Medications   insulin degludec (TRESIBA FLEXTOUCH) 100 UNIT/ML FlexTouch Pen   Other Relevant Orders   POCT HgB A1C   Comprehensive metabolic panel   Lipid panel   Urine Microalbumin w/creat. ratio   Atherosclerotic heart disease of native coronary artery without angina pectoris    No recent anginal episodes Continues daily baby aspirin and beta-blocker with metoprolol tartrate 25 mg twice a day Continues pravastatin 40 mg daily      Peripheral vascular disease (HCC)    Stable chronic condition.  No complications        Respiratory   Sleep apnea    Stable and well-controlled on CPAP treatment        Digestive   Diabetic gastroparesis (HCC)    Stable.  No longer taking Reglan      Relevant Medications   insulin degludec (TRESIBA FLEXTOUCH) 100 UNIT/ML FlexTouch Pen   Gastro-esophageal reflux disease with esophagitis    Stable.  Continues omeprazole 40 mg daily        Endocrine   Diabetic autonomic neuropathy (HCC)   Relevant Medications   insulin degludec (TRESIBA FLEXTOUCH) 100 UNIT/ML FlexTouch Pen   Diabetic peripheral neuropathy associated with type 2 diabetes mellitus (HCC)    Stable.  Continue Cymbalta 60 mg daily      Relevant Medications   insulin degludec (TRESIBA FLEXTOUCH) 100 UNIT/ML FlexTouch Pen   Dyslipidemia associated with type 2 diabetes mellitus (HCC)    Stable chronic conditions Continues pravastatin 40 mg daily and fenofibrate 160 mg daily Lipid profile done today      Relevant Medications    insulin degludec (TRESIBA FLEXTOUCH) 100 UNIT/ML FlexTouch Pen   Other Relevant Orders   Comprehensive metabolic panel   Lipid panel     Genitourinary   Stage 3a chronic kidney disease (HCC)    Advised to stay well-hydrated and avoid NSAIDs Continue Farxiga 10 mg daily        Other   Major depressive disorder, recurrent, mild (HCC)   Situational mixed anxiety and depressive disorder    Much improved.  Continue Cymbalta 60 mg daily      Patient Instructions  Diabetes Mellitus and Nutrition, Adult When you have diabetes, or diabetes mellitus, it is very important to have healthy eating habits because your blood sugar (glucose) levels are greatly affected by what you eat and drink. Eating healthy foods in the right amounts, at about the same times every day, can help you:  Manage your blood glucose. Lower your risk of heart disease. Improve your blood pressure. Reach or maintain a healthy weight. What can affect my meal plan? Every person with diabetes is different, and each person has different needs for a meal plan. Your health care provider may recommend that you work with a dietitian to make a meal plan that is best for you. Your meal plan may vary depending on factors such as: The calories you need. The medicines you take. Your weight. Your blood glucose, blood pressure, and cholesterol levels. Your activity level. Other health conditions you have, such as heart or kidney disease. How do carbohydrates affect me? Carbohydrates, also called carbs, affect your blood glucose level more than any other type of food. Eating carbs raises the amount of glucose in your blood. It is important to know how many carbs you can safely have in each meal. This is different for every person. Your dietitian can help you calculate how many carbs you should have at each meal and for each snack. How does alcohol affect me? Alcohol can cause a decrease in blood glucose (hypoglycemia), especially if  you use insulin or take certain diabetes medicines by mouth. Hypoglycemia can be a life-threatening condition. Symptoms of hypoglycemia, such as sleepiness, dizziness, and confusion, are similar to symptoms of having too much alcohol. Do not drink alcohol if: Your health care provider tells you not to drink. You are pregnant, may be pregnant, or are planning to become pregnant. If you drink alcohol: Limit how much you have to: 0-1 drink a day for women. 0-2 drinks a day for men. Know how much alcohol is in your drink. In the [place] one drink equals one 12 oz bottle of beer (355 mL), one 5 oz glass of wine (148 mL), or one 1 oz glass of hard liquor (44 mL). Keep yourself hydrated with water, diet soda, or unsweetened iced tea. Keep in mind that regular soda, juice, and other mixers may contain a lot of sugar and must be counted as carbs. What are tips for following this plan?  Reading food labels Start by checking the serving size on the Nutrition Facts label of packaged foods and drinks. The number of calories and the amount of carbs, fats, and other nutrients listed on the label are based on one serving of the item. Many items contain more than one serving per package. Check the total grams (g) of carbs in one serving. Check the number of grams of saturated fats and trans fats in one serving. Choose foods that have a low amount or none of these fats. Check the number of milligrams (mg) of salt (sodium) in one serving. Most people should limit total sodium intake to less than 2,300 mg per day. Always check the nutrition information of foods labeled as "low-fat" or "nonfat." These foods may be higher in added sugar or refined carbs and should be avoided. Talk to your dietitian to identify your daily goals for nutrients listed on the label. Shopping Avoid buying canned, pre-made, or processed foods. These foods tend to be high in fat, sodium, and added sugar. Shop around the outside edge of the  grocery store. This is where you will most often find fresh fruits and vegetables, bulk grains, fresh meats, and fresh dairy products. Cooking Use low-heat cooking methods, such as baking, instead of high-heat cooking methods, such as deep frying. Cook using healthy oils, such as olive, canola, or sunflower oil. Avoid cooking with butter, cream, or high-fat meats.  Meal planning Eat meals and snacks regularly, preferably at the same times every day. Avoid going long periods of time without eating. Eat foods that are high in fiber, such as fresh fruits, vegetables, beans, and whole grains. Eat 4-6 oz (112-168 g) of lean protein each day, such as lean meat, chicken, fish, eggs, or tofu. One ounce (oz) (28 g) of lean protein is equal to: 1 oz (28 g) of meat, chicken, or fish. 1 egg.  cup (62 g) of tofu. Eat some foods each day that contain healthy fats, such as avocado, nuts, seeds, and fish. What foods should I eat? Fruits Berries. Apples. Oranges. Peaches. Apricots. Plums. Grapes. Mangoes. Papayas. Pomegranates. Kiwi. Cherries. Vegetables Leafy greens, including lettuce, spinach, kale, chard, collard greens, mustard greens, and cabbage. Beets. Cauliflower. Broccoli. Carrots. Green beans. Tomatoes. Peppers. Onions. Cucumbers. Brussels sprouts. Grains Whole grains, such as whole-wheat or whole-grain bread, crackers, tortillas, cereal, and pasta. Unsweetened oatmeal. Quinoa. Brown or wild rice. Meats and other proteins Seafood. Poultry without skin. Lean cuts of poultry and beef. Tofu. Nuts. Seeds. Dairy Low-fat or fat-free dairy products such as milk, yogurt, and cheese. The items listed above may not be a complete list of foods and beverages you can eat and drink. Contact a dietitian for more information. What foods should I avoid? Fruits Fruits canned with syrup. Vegetables Canned vegetables. Frozen vegetables with butter or cream sauce. Grains Refined white flour and flour products  such as bread, pasta, snack foods, and cereals. Avoid all processed foods. Meats and other proteins Fatty cuts of meat. Poultry with skin. Breaded or fried meats. Processed meat. Avoid saturated fats. Dairy Full-fat yogurt, cheese, or milk. Beverages Sweetened drinks, such as soda or iced tea. The items listed above may not be a complete list of foods and beverages you should avoid. Contact a dietitian for more information. Questions to ask a health care provider Do I need to meet with a certified diabetes care and education specialist? Do I need to meet with a dietitian? What number can I call if I have questions? When are the best times to check my blood glucose? Where to find more information: American Diabetes Association: diabetes.org Academy of Nutrition and Dietetics: eatright.[employer] of Diabetes and Digestive and Kidney Diseases: StageSync.si Association of Diabetes Care & Education Specialists: diabeteseducator.org Summary It is important to have healthy eating habits because your blood sugar (glucose) levels are greatly affected by what you eat and drink. It is important to use alcohol carefully. A healthy meal plan will help you manage your blood glucose and lower your risk of heart disease. Your health care provider may recommend that you work with a dietitian to make a meal plan that is best for you. This information is not intended to replace advice given to you by your health care provider. Make sure you discuss any questions you have with your health care provider. Document Revised: 10/15/2019 Document Reviewed: 10/15/2019 Elsevier Patient Education  2023 Elsevier Inc.    Edwina Barth, MD [facility]

## 2022-08-16 NOTE — Assessment & Plan Note (Signed)
Advised to stay well-hydrated and avoid NSAIDs. Continue Farxiga 10 mg daily 

## 2022-08-22 ENCOUNTER — Encounter (INDEPENDENT_AMBULATORY_CARE_PROVIDER_SITE_OTHER): Payer: Medicare HMO | Admitting: Ophthalmology

## 2022-08-23 LAB — LAB REPORT - SCANNED: Creatinine, POC: 77.2 mg/dL

## 2022-08-25 ENCOUNTER — Encounter (INDEPENDENT_AMBULATORY_CARE_PROVIDER_SITE_OTHER): Payer: Medicare HMO | Admitting: Ophthalmology

## 2022-08-25 DIAGNOSIS — H35033 Hypertensive retinopathy, bilateral: Secondary | ICD-10-CM | POA: Diagnosis not present

## 2022-08-25 DIAGNOSIS — I10 Essential (primary) hypertension: Secondary | ICD-10-CM | POA: Diagnosis not present

## 2022-08-25 DIAGNOSIS — H43813 Vitreous degeneration, bilateral: Secondary | ICD-10-CM

## 2022-08-25 DIAGNOSIS — E113512 Type 2 diabetes mellitus with proliferative diabetic retinopathy with macular edema, left eye: Secondary | ICD-10-CM

## 2022-08-25 DIAGNOSIS — E113591 Type 2 diabetes mellitus with proliferative diabetic retinopathy without macular edema, right eye: Secondary | ICD-10-CM

## 2022-08-30 ENCOUNTER — Other Ambulatory Visit: Payer: Self-pay | Admitting: Nurse Practitioner

## 2022-08-30 DIAGNOSIS — F330 Major depressive disorder, recurrent, mild: Secondary | ICD-10-CM

## 2022-09-01 ENCOUNTER — Encounter: Payer: Self-pay | Admitting: Emergency Medicine

## 2022-09-04 ENCOUNTER — Other Ambulatory Visit: Payer: Self-pay | Admitting: Internal Medicine

## 2022-09-04 MED ORDER — CYCLOBENZAPRINE HCL 10 MG PO TABS: ORAL_TABLET | ORAL | 3 refills | Status: DC

## 2022-09-04 NOTE — Telephone Encounter (Signed)
Patient is again requesting refills of cyclobenzaprine (called on 09/01/22 as well). Looks like he was last seen by Gunnar Fusi 12-2021. Last appointment with Dr Leone Payor 10/2020. Dr Howell Rucks advise.Marland KitchenMarland Kitchen

## 2022-09-20 ENCOUNTER — Encounter: Payer: Self-pay | Admitting: Emergency Medicine

## 2022-09-20 NOTE — Telephone Encounter (Signed)
Can't do that.  Has to be done through sleep apnea doctor.

## 2022-09-20 NOTE — Telephone Encounter (Signed)
Pt need to see pulmonologist../lmb

## 2022-09-26 ENCOUNTER — Other Ambulatory Visit: Payer: Self-pay | Admitting: Emergency Medicine

## 2022-09-26 DIAGNOSIS — F330 Major depressive disorder, recurrent, mild: Secondary | ICD-10-CM

## 2022-09-30 ENCOUNTER — Other Ambulatory Visit: Payer: Self-pay | Admitting: Emergency Medicine

## 2022-10-06 ENCOUNTER — Encounter (INDEPENDENT_AMBULATORY_CARE_PROVIDER_SITE_OTHER): Payer: Medicare HMO | Admitting: Ophthalmology

## 2022-10-06 DIAGNOSIS — I10 Essential (primary) hypertension: Secondary | ICD-10-CM

## 2022-10-06 DIAGNOSIS — E113591 Type 2 diabetes mellitus with proliferative diabetic retinopathy without macular edema, right eye: Secondary | ICD-10-CM | POA: Diagnosis not present

## 2022-10-06 DIAGNOSIS — H35033 Hypertensive retinopathy, bilateral: Secondary | ICD-10-CM | POA: Diagnosis not present

## 2022-10-06 DIAGNOSIS — E113512 Type 2 diabetes mellitus with proliferative diabetic retinopathy with macular edema, left eye: Secondary | ICD-10-CM | POA: Diagnosis not present

## 2022-10-06 DIAGNOSIS — H43813 Vitreous degeneration, bilateral: Secondary | ICD-10-CM

## 2022-10-06 DIAGNOSIS — Z794 Long term (current) use of insulin: Secondary | ICD-10-CM

## 2022-10-08 ENCOUNTER — Other Ambulatory Visit: Payer: Self-pay | Admitting: Emergency Medicine

## 2022-10-08 DIAGNOSIS — I152 Hypertension secondary to endocrine disorders: Secondary | ICD-10-CM

## 2022-10-20 ENCOUNTER — Other Ambulatory Visit: Payer: Self-pay | Admitting: Emergency Medicine

## 2022-11-04 ENCOUNTER — Other Ambulatory Visit: Payer: Self-pay | Admitting: Physician Assistant

## 2022-11-04 ENCOUNTER — Other Ambulatory Visit: Payer: Self-pay | Admitting: Emergency Medicine

## 2022-11-17 ENCOUNTER — Encounter (INDEPENDENT_AMBULATORY_CARE_PROVIDER_SITE_OTHER): Payer: Medicare HMO | Admitting: Ophthalmology

## 2022-11-17 DIAGNOSIS — H35033 Hypertensive retinopathy, bilateral: Secondary | ICD-10-CM | POA: Diagnosis not present

## 2022-11-17 DIAGNOSIS — E113513 Type 2 diabetes mellitus with proliferative diabetic retinopathy with macular edema, bilateral: Secondary | ICD-10-CM | POA: Diagnosis not present

## 2022-11-17 DIAGNOSIS — Z794 Long term (current) use of insulin: Secondary | ICD-10-CM

## 2022-11-17 DIAGNOSIS — I10 Essential (primary) hypertension: Secondary | ICD-10-CM

## 2022-11-29 ENCOUNTER — Other Ambulatory Visit: Payer: Self-pay | Admitting: Emergency Medicine

## 2022-12-01 ENCOUNTER — Other Ambulatory Visit: Payer: Self-pay | Admitting: Internal Medicine

## 2022-12-05 ENCOUNTER — Ambulatory Visit (INDEPENDENT_AMBULATORY_CARE_PROVIDER_SITE_OTHER): Payer: Medicare HMO | Admitting: Neurology

## 2022-12-05 DIAGNOSIS — Z951 Presence of aortocoronary bypass graft: Secondary | ICD-10-CM | POA: Diagnosis not present

## 2022-12-05 DIAGNOSIS — G4734 Idiopathic sleep related nonobstructive alveolar hypoventilation: Secondary | ICD-10-CM

## 2022-12-05 DIAGNOSIS — G4719 Other hypersomnia: Secondary | ICD-10-CM

## 2022-12-05 DIAGNOSIS — R519 Headache, unspecified: Secondary | ICD-10-CM

## 2022-12-05 DIAGNOSIS — G4720 Circadian rhythm sleep disorder, unspecified type: Secondary | ICD-10-CM

## 2022-12-05 DIAGNOSIS — G4733 Obstructive sleep apnea (adult) (pediatric): Secondary | ICD-10-CM

## 2022-12-05 DIAGNOSIS — E669 Obesity, unspecified: Secondary | ICD-10-CM

## 2022-12-06 NOTE — Addendum Note (Signed)
Addended by: Huston Foley on: 12/06/2022 09:47 AM   Modules accepted: Orders

## 2022-12-06 NOTE — Procedures (Signed)
Physician Interpretation:     [facility] SPLIT NIGHT INTERPRETATION REPORT   STUDY DATE: 12/05/2022     PATIENT NAME:  Joseph [name], Joseph [name]         DATE OF BIRTH:  09-13-1955  PATIENT ID:  [id number]    TYPE OF STUDY:  PSG  READING PHYSICIAN: Huston Foley, MD, PhD SCORING TECHNICIAN: Domingo Cocking     Referred by: Georgina Quint, MD  ? History and Indication for Testing: 67 year old male with an underlying complex medical history of anemia, arthritis, BPH, chronic renal insufficiency, coronary artery disease with history of MI, status post 5 vessel CABG in 2013, depression, anxiety, diabetes, memory loss (followed by [facility]), history of gastroparesis, reflux disease, hypertension, hyperlipidemia, neuropathy, PVD, and mild obesity, who was previously diagnosed with obstructive sleep apnea and recommended for CPAP therapy. His Epworth sleepiness score is 15 out of 24, fatigue severity score is 57 out of 63. ADDITIONAL INFORMATION:  Height: 66.0 in Weight: 190 lb (BMI 30) Neck Size: 16.3 in   Medications: Vitamin C, Aspirin, Symbicort, Vitamin D3, Flexeril, Cymbalta, Vitamin D2, Pepcid, Farxiga, fenofibrate, iron, Lasix, Xyzal, Zestril, Lopressor, Bactroban, Nitrostat, Prilosec, Pravachol, Hali Marry, Xanax, Tessaalon ?    DESCRIPTION: A sleep technologist was in attendance for the duration of the recording.  Data collection, scoring, video monitoring, and reporting were performed in compliance with the AASM Manual for the Scoring of Sleep and Associated Events; (Hypopnea is scored based on the criteria listed in Section VIII D. 1b in the AASM Manual V2.6 using a 4% oxygen desaturation rule or Hypopnea is scored based on the criteria listed in Section VIII D. 1a in the AASM Manual V2.6 using 3% oxygen desaturation and /or arousal rule).  A physician certified by the American Board of Sleep Medicine reviewed each epoch of the study.   FINDINGS:  Please refer to the  attached summary for additional quantitative information. STUDY DETAILS: Lights off was at 22:05: and lights on 05:00: (415 minutes hours in bed). This study was performed with an initial diagnostic portion followed by positive airway pressure titration.  DIAGNOSTIC ANALYSIS   SLEEP CONTINUITY AND SLEEP ARCHITECTURE:  The diagnostic portion of the study began at 22:05 and ended at 01:06, for a recording time of 3h 1.46m minutes.  Total sleep time was 128 minutes minutes (100.0% supine;  0.0% lateral;  0.0% prone, 1.9% REM sleep), with a decreased sleep efficiency at 70.8%. Sleep latency was mildly delayed at 24.5 minutes. REM sleep latency was below normal at 50.5 minutes.  Arousal index was 43.0 /hr. Of the total sleep time, the percentage of stage N1 sleep was 36.6%, which is increased, stage N2 sleep was 61.5%, which is increased, stage N3 sleep was absent, and REM sleep was 1.9%, which is reduced. Wake after sleep onset (WASO) time accounted for 28 minutes with moderate sleep fragmentation noted.   AROUSAL (Baseline): There were 84.0 arousals in total, for an arousal index of 39.2 arousals/hour.  Of these, 55.0 were identified as respiratory-related arousals (25.7 /hr), 0 were PLM-related arousals (0.0 /hr), and 37 were non-specific arousals (17.3 /hr)   RESPIRATORY MONITORING:  Based on CMS criteria (using a 4% oxygen desaturation rule for scoring hypopneas), there were 173 apneas (155 obstructive; 9 central; 9 mixed), and 30 hypopneas.  Apnea index was 50.0. Hypopnea index was 2.3. The apnea-hypopnea index was 52.3/hour overall (52.3 supine; 48.0 REM, 48.0 supine REM). There were 0 respiratory effort-related arousals (RERAs).  The RERA index was  0.0 events/hr. Total respiratory disturbance index (RDI) was 52.3 events/hr. RDI results showed: supine RDI  52.3 /hr; non-supine RDI 0.0 /hr; REM RDI 48.0 /hr, supine REM RDI 48.0 /hr.  Based on AASM criteria (using a 3% oxygen desaturation and /or arousal  rule for scoring hypopneas), there were 173 apneas (155 obstructive; 9 central; 9 mixed), and 30 hypopneas.  Apnea index was 50.0. Hypopnea index was 3.3. The apnea-hypopnea index was 53.2 overall (53.2 supine; 48.0 REM, 48.0 supine REM). There were 0 respiratory effort-related arousals (RERAs). Total respiratory disturbance index (RDI) was 53.2 events/hr. RDI results showed: supine RDI 53.2 /hr; non-supine RDI 0.0 /hr; REM RDI 48.0 /hr, supine REM RDI 48.0 /hr.   Respiratory events were associated with oxyhemoglobin desaturations (nadir 68%) from a normal baseline (mean 99%). Total time spent at, or below 88% was 24.5 minutes, or 19.1%  of total sleep time. Snoring was mild to moderate.      LIMB MOVEMENTS: There were 0 periodic limb movements of sleep (0.0/hr), of which 0 (0.0/hr) were associated with an arousal.     OXIMETRY: Total sleep time spent at, or below 88% was 22.5 minutes, or 17.5% of total sleep time.      BODY POSITION: Duration of total sleep and percent of total sleep in their respective position is as follows: supine 128 minutes minutes (100.0%), non-supine 0.0 minutes (0.0%); right 00 minutes minutes (0.0%), left 00 minutes minutes (0.0%), and prone 00 minutes minutes (0.0%). Total supine REM sleep time was 02 minutes minutes (100.0% of total REM sleep).   Analysis of electrocardiogram activity showed the highest heart rate for the baseline portion of the study was 62.0 beats per minute.  The average heart rate during sleep was 57 bpm, while the highest heart rate for the same period was 62 bpm.    TREATMENT ANALYSIS  SLEEP CONTINUITY AND SLEEP ARCHITECTURE:  The treatment portion of the study began at 01:06 and ended at 05:00, for a recording time of 3h 53.66m minutes.  Total sleep time was 186 minutes minutes (100.0% supine;  0.0% lateral; 0.0% prone, 7.5% REM sleep), with a decreased sleep efficiency at 79.7%. Sleep latency was normal at 9.5 minutes. REM sleep latency was  increased at 133.5 minutes.  Arousal index was 18.7 /hr. Of the total sleep time, the percentage of stage N1 sleep was 31.7%, stage N3 sleep was 0.0%, and REM sleep was 7.5%. There were 3 Stage R periods observed during this portion of the study, 20 awakenings (i.e. transitions to Stage W from any sleep stage), and 73.0 total stage transitions. Wake after sleep onset (WASO) time accounted for 38 minutes.   AROUSAL: There were 46.0 arousals in total, for an arousal index of 14.8 arousals/hour.  Of these, 22.0 were identified as respiratory-related arousals (7.1 /hr), 0 were PLM-related arousals (0.0 /hr), and 36 were non-specific arousals (11.6 /hr)  RESPIRATORY MONITORING:    While on PAP therapy, based on CMS criteria, the apnea-hypopnea index was 29.4 overall (29.4 supine; 1.0 REM).   While on PAP therapy, based on AASM criteria, the apnea-hypopnea index was 34.2 overall (34.2 supine; 1.0 REM).   Respiratory events were associated with oxyhemoglobin desaturation (nadir 80.0%) from a mean of 99.0%.  Total time spent at, or below 88% was 5.8 minutes, or 3.1%  of total sleep time.  Snoring was absent:  . There were 0.0 occurrences of Cheyne Stokes breathing.  LIMB MOVEMENTS: There were 0 periodic limb movements of sleep (0.0/hr), of which  0 (0.0/hr) were associated with an arousal.   OXIMETRY: Total sleep time spent at, or below 88% was 5.5 minutes, or 3.0% of total sleep time. Snoring was classified as .   BODY POSITION: Duration of total sleep and percent of total sleep in their respective position is as follows: supine 186 minutes minutes (100.0%), non-supine 0.0 minutes (0.0%); right 00 minutes minutes (0.0%), left 00 minutes minutes (0.0%), and prone 00 minutes minutes (0.0%). Total supine REM sleep time was 14 minutes minutes (100.0% of total REM sleep).   Analysis of electrocardiogram activity showed the highest heart rate for the treatment portion of the study was 59.0 beats per minute.  The  average heart rate during sleep was 54 bpm, while the highest heart rate for the same period was 58 bpm.   TITRATION DETAILS (SEE ALSO TABLE AT THE END OF THE REPORT):  The patient qualified for an emergency split sleep study per AASM standards. The baseline AHI was 52.3/h, and O2 nadir 68%. There was evidence of nocturnal hypoxemia. The patient was shown several different interfaces and was subsequently fitted with a medium Vitera full face mask from [employer].  The patient was started on a pressure of 5 cm of water pressure and gradually titrated to a final titration pressure of 15 cm. He did not achieve much sleep on the final pressure (only 4.5 min) and his AHI was improved, but not optimized during the titration portion of the study, in part, owing to sleep fragmentation. His AHI was 17.4/hour at a pressure of 12 cm, with brief supine REM sleep achieved, O2 nadir of 94%.      EEG: Review of the EEG showed no abnormal electrical discharges and symmetrical bihemispheric findings.    EKG: The EKG revealed normal sinus rhythm (NSR). The average heart rate during sleep was 57 bpm during the baseline portion of the study and 54 bpm during the treatment portion of the study.  AUDIO/VIDEO REVIEW: The audio and video review did not show any abnormal or unusual behaviors, movements, phonations or vocalizations. The patient took no restroom breaks. Snoring was noted, in the mild to moderate range.   POST-STUDY QUESTIONNAIRE: A post-study questionnaire was not fully completed.    IMPRESSION:    1. Severe Obstructive Sleep Apnea (OSA) 2. Nocturnal hypoxemia 3. Dysfunctions associated with sleep stages or arousal from sleep  RECOMMENDATIONS:    1. This patient has severe obstructive sleep apnea. The patient qualified for an emergency split sleep study per AASM standards. The baseline AHI was 52.3/h, and O2 nadir 68%. There was evidence of nocturnal hypoxemia. The patient responded well to PAP therapy, however,  an optimal CPAP pressure was not determined during the titration portion of the study, due, in part, to significant sleep fragmentation. I recommend home autoPAP therapy with a pressure range of 8 to 16 cm with EPR of 3, via medium FFM (or mask of choice, sized to fit, and EPR as per tolerance). The patient will be advised to be fully compliant with PAP therapy to improve sleep related symptoms and decrease long term cardiovascular risks. Please note, that untreated obstructive sleep apnea may carry additional perioperative morbidity. Patients with significant obstructive sleep apnea should receive perioperative PAP therapy and the surgeons and particularly the anesthesiologist should be informed of the diagnosis and the severity of the sleep disordered breathing. 2. This study shows significant sleep fragmentation and abnormal sleep stage percentages; these are nonspecific findings and per se do not signify an intrinsic sleep  disorder or a cause for the patient's sleep-related symptoms. Causes include (but are not limited to) the first night effect of the sleep study, circadian rhythm disturbances, medication effect or an underlying mood disorder or medical problem.  3. The patient should be cautioned not to drive, work at heights, or operate dangerous or heavy equipment when tired or sleepy. Review and reiteration of good sleep hygiene measures should be pursued with any patient. 4. The patient will be seen in follow-up in the sleep clinic at [facility] for discussion of the test results, symptom and treatment compliance review, further management strategies, etc. The referring provider will be notified of the test results.   I certify that I have reviewed the entire raw data recording prior to the issuance of this report in accordance with the Standards of Accreditation of the American Academy of Sleep Medicine (AASM).  Huston Foley, MD, PhD Medical Director, [facility]  [facility]) Diplomat, ABPN (Neurology and Sleep)               Technical Report:   [facility] Split Summary    General Information  Name: Joseph [name], Joseph [name] BMI: 30.67 Physician: Huston Foley, MD  ID: [id number] Height: 66.0 in Technician: Domingo Cocking, RPSGT  Sex: Unknown Weight: 190.0 lb Record: [id number]  Age: 82 [1955-08-16] Date: 12/05/2022     Medical & Medication History    Mr. Kumari is a 67 year old male with an underlying complex medical history of anemia, arthritis, BPH, chronic renal insufficiency, coronary artery disease with history of MI, status post 5 vessel CABG in 2013, depression, anxiety, diabetes, neuropathy, history of gastroparesis, reflux disease, hypertension, hyperlipidemia, neuropathy, PVD, and mild obesity, who was previously diagnosed with obstructive sleep apnea and recommended for CPAP therapy but he has not been on PAP therapy for years. History is provided by his daughter almost exclusively. His Epworth sleepiness score is 15 out of 24, fatigue severity score is 57 out of 63. Prior sleep study results are not available for my review today. Testing was some 20 years ago according to patient's daughter and he has not been on PAP therapy in about 15 years as per daughter's recollection.  Vitamin C, Aspirin, Symbicort, Vitamin D3, Flexeril, Cymbalta, Vitamin D2, Pepcid, Farxiga, fenofibrate, iron, Lasix, Xyzal, Zestril, Lopressor, Bactroban, Nitrostat, Prilosec, Pravachol, Hali Marry, Xanax, Tessaalon   Sleep Disorder      Comments   Patient arrived for a diagnostic polysomnogram. Procedure explained and all questions answered. Standard paste setup without complications. Patient slept supine, left, and right. Mild to moderate snoring heard. Respiratory events observed. After 2 hours total sleep time, AHI = 53. CPAP started at 5cm with a medium Vitera FFM. CPAP increased to 15cm in an effort to control obstructive respiratory  events and abolish snoring. No significant cardiac arrhythmias observed. No significant PLMS observed. No nocturia.    Baseline Sleep Stage Information Baseline start time: 10:05:15 PM Baseline end time: 01:06:48 AM   Time Total Supine Side Prone Upright  Recording 3h 1.72m 3h 1.65m 0h 0.80m 0h 0.58m 0h 0.12m  Sleep 2h 8.4m 2h 8.54m 0h 0.37m 0h 0.3m 0h 0.72m   Latency N1 N2 N3 REM Onset Per. Slp. Eff.  Actual 0h 0.19m 0h 14.48m 0h 0.11m 0h 50.10m 0h 24.22m 1h 25.4m 70.80%   Stg Dur Wake N1 N2 N3 REM  Total 53.0 47.0 79.0 0.0 2.5  Supine 53.0 47.0 79.0 0.0 2.5  Side 0.0 0.0 0.0 0.0 0.0  Prone 0.0  0.0 0.0 0.0 0.0  Upright 0.0 0.0 0.0 0.0 0.0   Stg % Wake N1 N2 N3 REM  Total 29.2 36.6 61.5 0.0 1.9  Supine 29.2 36.6 61.5 0.0 1.9  Side 0.0 0.0 0.0 0.0 0.0  Prone 0.0 0.0 0.0 0.0 0.0  Upright 0.0 0.0 0.0 0.0 0.0    CPAP Sleep Stage Information CPAP start time: 01:06:48 AM CPAP end time: 05:00:14 AM   Time Total Supine Side Prone Upright  Recording (TRT) 3h 53.73m 3h 53.51m 0h 0.31m 0h 0.43m 0h 0.54m  Sleep (TST) 3h 6.41m 3h 6.81m 0h 0.19m 0h 0.47m 0h 0.35m   Latency N1 N2 N3 REM Onset Per. Slp. Eff.  Actual 0h 0.89m 0h 11.78m 0h 0.20m 2h 13.73m 0h 9.55m 0h 17.40m 79.66%   Stg Dur Wake N1 N2 N3 REM  Total 47.5 59.0 113.0 0.0 14.0  Supine 47.5 59.0 113.0 0.0 14.0  Side 0.0 0.0 0.0 0.0 0.0  Prone 0.0 0.0 0.0 0.0 0.0  Upright 0.0 0.0 0.0 0.0 0.0   Stg % Wake N1 N2 N3 REM  Total 20.3 31.7 60.8 0.0 7.5  Supine 20.3 31.7 60.8 0.0 7.5  Side 0.0 0.0 0.0 0.0 0.0  Prone 0.0 0.0 0.0 0.0 0.0  Upright 0.0 0.0 0.0 0.0 0.0    Baseline Respiratory Information Apnea Summary Sub Supine Side Prone Upright  Total 107 Total 107 107 0 0 0    REM 2 2 0 0 0    NREM 105 105 0 0 0  Obs 107 REM 2 2 0 0 0    NREM 105 105 0 0 0  Mix 0 REM 0 0 0 0 0    NREM 0 0 0 0 0  Cen 0 REM 0 0 0 0 0    NREM 0 0 0 0 0   Rera Summary Sub Supine Side Prone Upright  Total 0 Total 0 0 0 0 0    REM 0 0 0 0 0    NREM 0 0 0 0 0   Hypopnea  Summary Sub Supine Side Prone Upright  Total 7 Total 7 7 0 0 0    REM 0 0 0 0 0    NREM 7 7 0 0 0   4% Hypopnea Summary Sub Supine Side Prone Upright  Total (4%) 5 Total 5 5 0 0 0    REM 0 0 0 0 0    NREM 5 5 0 0 0     AHI Total Obs Mix Cen  53.23 Apnea 49.96 49.96 0.00 0.00   Hypopnea 3.27 -- -- --  52.30 Hypopnea (4%) 2.33 -- -- --    Total Supine Side Prone Upright  Position AHI 53.23 53.23 0.00 0.00 0.00  REM AHI 48.00   NREM AHI 53.33   Position RDI 53.23 53.23 0.00 0.00 0.00  REM RDI 48.00   NREM RDI 53.33    4% Hypopnea Total Supine Side Prone Upright  Position AHI (4%) 52.30 52.30 0.00 0.00 0.00  REM AHI (4%) 48.00   NREM AHI (4%) 52.38   Position RDI (4%) 52.30 52.30 0.00 0.00 0.00  REM RDI (4%) 48.00   NREM RDI (4%) 52.38    CPAP Respiratory Information Apnea Summary Sub Supine Side Prone Upright  Total 66 Total 66 66 0 0 0    REM 1 1 0 0 0    NREM 65 65 0 0 0  Obs 48 REM 0 0 0 0 0  NREM 48 48 0 0 0  Mix 9 REM 0 0 0 0 0    NREM 9 9 0 0 0  Cen 9 REM 1 1 0 0 0    NREM 8 8 0 0 0   Rera Summary Sub Supine Side Prone Upright  Total 0 Total 0 0 0 0 0    REM 0 0 0 0 0    NREM 0 0 0 0 0   Hypopnea Summary Sub Supine Side Prone Upright  Total 40 Total 40 40 0 0 0    REM 2 2 0 0 0    NREM 38 38 0 0 0   4% Hypopnea Summary Sub Supine Side Prone Upright  Total (4%) 25 Total 25 25 0 0 0    REM 2 2 0 0 0    NREM 23 23 0 0 0     AHI Total Obs Mix Cen  34.19 Apnea 21.29 15.48 2.90 2.90   Hypopnea 12.90 -- -- --  29.35 Hypopnea (4%) 8.06 -- -- --    Total Supine Side Prone Upright  Position AHI 34.19 34.19 0.00 0.00 0.00  REM AHI 12.86   NREM AHI 35.93   Position RDI 34.19 34.19 0.00 0.00 0.00  REM RDI 12.86   NREM RDI 35.93    4% Hypopnea Total Supine Side Prone Upright  Position AHI (4%) 29.35 29.35 0.00 0.00 0.00  REM AHI (4%) 12.86   NREM AHI (4%) 30.70   Position RDI (4%) 29.35 29.35 0.00 0.00 0.00  REM RDI (4%) 12.86   NREM RDI (4%) 30.70     Desaturation Information (Baseline)  <100% <90% <80% <70% <60% <50% <40%  Supine 112 72 33 1 0 0 0  Side 0 0 0 0 0 0 0  Prone 0 0 0 0 0 0 0  Upright 0 0 0 0 0 0 0  Total 112 72 33 1 0 0 0  Desaturation threshold setting: 3% Minimum desaturation setting: 10 seconds SaO2 nadir: 68% The longest event was a 57 sec obstructive Apneawith a minimum SaO2 of 70%. The lowest SaO2 was 68% associated with a 40 sec obstructive Apnea. Awakening/Arousal Information (Baseline) # of Awakenings 20  Wake after sleep onset 28.33m  Wake after persistent sleep 11.24m   Arousal Assoc. Arousals Index  Apneas 52 24.3  Hypopneas 3 1.4  Leg Movements 0 0.0  Snore 0.0 0.0  PTT Arousals 0 0.0  Spontaneous 37 17.3  Total 92 43.0   Desaturation Information (CPAP)  <100% <90% <80% <70% <60% <50% <40%  Supine 96 35 0 0 0 0 0  Side 0 0 0 0 0 0 0  Prone 0 0 0 0 0 0 0  Upright 0 0 0 0 0 0 0  Total 96 35 0 0 0 0 0  Desaturation threshold setting: 3% Minimum desaturation setting: 10 seconds SaO2 nadir: 80% The longest event was a 53 sec mixed Apnea with a minimum SaO2 of 83%. The lowest SaO2 was 80% associated with a 43 sec obstructive Apnea. Awakening/Arousal Information (CPAP) # of Awakenings 20  Wake after sleep onset 38.43m  Wake after persistent sleep 34.23m   Arousal Assoc. Arousals Index  Apneas 15 4.8  Hypopneas 8 2.6  Leg Movements 0 0.0  Snore 0.0 0.0  PTT Arousals 0 0.0  Spontaneous 36 11.6  Total 59 19.0     EKG Rates (Baseline) EKG Avg Max Min  Awake 56 62 51  Asleep 57 62 55  EKG Events: [name] Myoclonus Information (Baseline) PLMS LMs Index  Total LMs during PLMS 0 0.0  LMs w/ Microarousals 0 0.0   LM LMs Index  w/ Microarousal 0 0.0  w/ Awakening 0 0.0  w/ Resp Event 0 0.0  Spontaneous 1 0.5  Total 1 0.5   EKG Rates (CPAP) EKG Avg Max Min  Awake 55 59 52  Asleep 54 58 51  EKG Events: [name] Myoclonus Information (CPAP) PLMS LMs Index  Total LMs during PLMS 0 0.0  LMs  w/ Microarousals 0 0.0   LM LMs Index  w/ Microarousal 0 0.0  w/ Awakening 0 0.0  w/ Resp Event 0 0.0  Spontaneous 7 2.3  Total 7 2.3      Titration Table:  [name] CPAP/Bilevel Report    General Information  Name: Joseph [name], Joseph [name] BMI: 30 Physician: Huston Foley, MD  ID: [id number] Height: 66 in Technician: Domingo Cocking  Sex: Unknown Weight: 190 lb Record: [id number]  Age: 8 [04/20/1955] Date: 12/05/2022 Scorer: Domingo Cocking   Recommended Settings IPAP: [name] cmH20 EPAP: [name] cmH2O AHI: [name] AHI (4%): [name]   Pressure IPAP/EPAP 00 05 07 09 10 12 14 15    O2 Vol 0.0 0.0 0.0 0.0 0.0 0.0 0.0 0.0  Time TRT 181.52m 30.91m 50.69m 24.40m 32.7m 37.106m 42.60m 17.3m   TST 128.23m 17.52m 42.70m 21.71m 31.43m 31.60m 38.79m 4.60m  Sleep Stage % Wake 29.2 43.3 16.0 10.4 3.1 17.3 8.3 74.3   % REM 1.9 0.0 0.0 0.0 0.0 14.5 24.7 0.0   % N1 36.6 64.7 39.3 18.6 4.8 59.7 19.5 0.0   % N2 61.5 35.3 60.7 81.4 95.2 25.8 55.8 100.0   % N3 0.0 0.0 0.0 0.0 0.0 0.0 0.0 0.0  Respiratory Total Events 114 19 28 14 21 9 13 2    Obs. Apn. 107 8 17 10 12 1  0 0   Mixed Apn. 0 0 2 1 6  0 0 0   Cen. Apn. 0 2 1 0 0 1 3 2    Hypopneas 7 9 8 3 3 7 10  0   AHI 53.23 67.06 40.00 39.07 40.00 17.42 20.26 26.67   Supine AHI 53.23 67.06 40.00 39.07 40.00 17.42 20.26 26.67   Prone AHI 0.00 0.00 0.00 0.00 0.00 0.00 0.00 0.00   Side AHI 0.00 0.00 0.00 0.00 0.00 0.00 0.00 0.00  Respiratory (4%) Hypopneas (4%) 5.00 8.00 3.00 2.00 3.00 2.00 7.00 0.00   AHI (4%) 52.30 63.53 32.86 36.28 40.00 7.74 15.58 26.67   Supine AHI (4%) 52.30 63.53 32.86 36.28 40.00 7.74 15.58 26.67   Prone AHI (4%) 0.00 0.00 0.00 0.00 0.00 0.00 0.00 0.00   Side AHI (4%) 0.00 0.00 0.00 0.00 0.00 0.00 0.00 0.00  Desat Profile <= 90% 29.21m 1.58m 4.30m 1.41m 1.37m 0.73m 0.74m 0.60m   <= 80% 7.40m 0.46m 0.63m 0.2m 0.39m 0.57m 0.66m 0.16m   <= 70% 0.74m 0.65m 0.74m 0.39m 0.72m 0.33m 0.27m 0.82m   <= 60% 0.43m 0.74m 0.69m 0.12m 0.7m 0.53m 0.2m 0.84m   Arousal Index Apnea 24.3 3.5 8.6 5.6 5.7 1.9 1.6 13.3   Hypopnea 1.4 0.0 4.3 0.0 1.9 5.8 1.6 0.0   LM 0.0 0.0 0.0 0.0 0.0 0.0 0.0 0.0   Spontaneous 17.3 3.5 8.6 16.7 9.5 7.7 21.8 0.0

## 2022-12-07 ENCOUNTER — Telehealth: Payer: Self-pay | Admitting: *Deleted

## 2022-12-07 NOTE — Telephone Encounter (Signed)
Spoke to daughter (checked DPR)  Gave Daughter sleep results Made daughter aware thar pt was urgent set  Daughter chose [employer] DME Pt aware of insurance compliance . Made pt f/u appointment 02/2023 with Jessica,NP. Sent PCP sleep results and faxed over orders to [employer] [facility] this evening . Daughter states will make pt aware . Daughter thanked me for calling

## 2022-12-07 NOTE — Telephone Encounter (Signed)
-----   Message from Huston Foley sent at 12/06/2022  9:47 AM EDT ----- Urgent set up requested on PAP therapy, due to severe OSA. Spanish speaking, daughter interpreted during our visit.  Patient referred by PCP, seen by me on 05/22/22, patient had a split night sleep study on 12/05/22. Please call and notify patient that the recent sleep study showed severe obstructive sleep apnea (OSA). He did well with CPAP, but an optimal pressure was not found. I recommend we start him on a home autoPAP machine. I placed the order in the chart.  Please advise patient that we will need a follow up appointment with either myself or one of our nurse practitioners in about 2-3 months post set-up to check for how they are doing on treatment and how well it's going with the machine in general. Most insurance company require a certain compliance percentage to continue to cover/pay for the machine. Please ask patient to schedule this FU appointment, according to the set-up date, which is the day they receive the machine. Please make sure, the patient understands the importance of keeping this window for the FU appointment, as it is often an Barista and not our rule. Failing to adhere to this may result in losing coverage for sleep apnea treatment, at which point some insurances require repeating the whole process. Plus, monitoring compliance data is usually good feedback for the patient as far as how they are doing, how many hours they are on it, how well the mask fits, etc.  Also remind patient, that any PAP machine or mask issues should be first addressed with the DME company, who provided the machine/supplies.  Please ask if patient has a preference regarding DME company, may depend on the insurance too.  Huston Foley, MD, PhD [facility])

## 2022-12-19 ENCOUNTER — Other Ambulatory Visit: Payer: Self-pay | Admitting: Internal Medicine

## 2022-12-20 NOTE — Telephone Encounter (Signed)
May I refill Sir, he has an upcoming appointment next week.

## 2022-12-28 ENCOUNTER — Encounter: Payer: Self-pay | Admitting: Internal Medicine

## 2022-12-28 ENCOUNTER — Ambulatory Visit: Payer: Medicare HMO | Admitting: Internal Medicine

## 2022-12-28 VITALS — BP 114/72 | HR 56 | Ht 66.0 in | Wt 187.0 lb

## 2022-12-28 DIAGNOSIS — K317 Polyp of stomach and duodenum: Secondary | ICD-10-CM

## 2022-12-28 DIAGNOSIS — K3184 Gastroparesis: Secondary | ICD-10-CM

## 2022-12-28 DIAGNOSIS — R109 Unspecified abdominal pain: Secondary | ICD-10-CM

## 2022-12-28 DIAGNOSIS — K5909 Other constipation: Secondary | ICD-10-CM

## 2022-12-28 DIAGNOSIS — G8929 Other chronic pain: Secondary | ICD-10-CM

## 2022-12-28 DIAGNOSIS — K219 Gastro-esophageal reflux disease without esophagitis: Secondary | ICD-10-CM | POA: Diagnosis not present

## 2022-12-28 NOTE — Patient Instructions (Signed)
Try to take the cyclobenzaprine as needed instead of taking it on a regular basis.  Dr Leone Payor recommends that you complete a bowel purge (to clean out your bowels). Please do the following: Purchase a bottle of Miralax over the counter as well as a box of 5 mg dulcolax tablets. Take 4 dulcolax tablets. Wait 1 hour. You will then drink 6-8 capfuls of Miralax mixed in an adequate amount of water/juice/gatorade (you may choose which of these liquids to drink) over the next 2-3 hours. You should expect results within 1 to 6 hours after completing the bowel purge.  After doing the bowel purge take one dose of Miralax daily. If you go 2 to 3 days with no bowel movement then take 1 to 2  an over the counter Dulcolax.     You have been scheduled for an endoscopy. Please follow written instructions given to you at your visit today.  If you use inhalers (even only as needed), please bring them with you on the day of your procedure.  If you take any of the following medications, they will need to be adjusted prior to your procedure:   DO NOT TAKE 7 DAYS PRIOR TO TEST- Trulicity (dulaglutide) Ozempic, Wegovy (semaglutide) Mounjaro (tirzepatide) Bydureon Bcise (exanatide extended release)  DO NOT TAKE 1 DAY PRIOR TO YOUR TEST Rybelsus (semaglutide) Adlyxin (lixisenatide) Victoza (liraglutide) Byetta (exanatide) ___________________________________________________________________________   I appreciate the opportunity to care for you. Stan Head, MD, [facility]

## 2022-12-28 NOTE — Progress Notes (Signed)
Joseph [name] 67 y.o. Oct 24, 1955 [id number]  Assessment & Plan:   Encounter Diagnoses  Name Primary?   Gastroparesis Yes   Gastric polyps    Chronic abdominal pain    Gastroesophageal reflux disease without esophagitis    Chronic constipation    Schedule EGD at the hospital to follow-up and remove gastric polyps  Educated on trying to use the Flexeril (cyclobenzaprine) as needed.    Continue metoclopramide as needed.  No signs of tardive dyskinesia.  MiraLax purge then every day MiraLax + prn dulcolax to treat constipation  Follow-up to be recommended after EGD  CC: Sagardia, Eilleen Kempf, MD  Subjective:  Gastroenterology summary   Diabetic gastroparesis for many years previously treated with Zelnorm and metoclopramide.  Has settled in on as needed metoclopramide without difficulty, uses cyclobenzaprine for abdominal pain associated with the gastroparesis.   Chronic constipation  Gastric polyps (hyperplastic) multiple on EGD September 2022 Max 7 mm  History of colon polyps-2 adenomas max 5 mm 2016 diminutive lymphoid polyps 2021 recall planned for 2031   --------------------------------------------------------------------------------------------- Chief Complaint: Follow-up of gastroparesis GERD and associated abdominal pain  HPI 67 year old Hispanic man with long history of insulin-dependent diabetes mellitus, chronic renal insufficiency, peripheral vascular disease and prior myocardial infarction and CABG here with his daughter, who has a history of gastroparesis managed by as needed Reglan and also associated abdominal pain that has been helped by cyclobenzaprine or Flexeril 3 times daily.  He reports that he does not really have any symptoms these days and continues to take the cyclobenzaprine 3 times daily regularly even though it is written as needed.  He is followed in cardiology and had some change in renal function that resolved after adjustment of furosemide  dose to as needed.  He also complains of constipation and strains to move his bowels for a long time now.  Citrate of magnesia has been somewhat helpful Milk of Magnesia Maalox Senokot minimally so but nothing has been taken on a regular basis.  He does not have any complaints of anything that sounds like tardive dyskinesia or neuropathy related to Reglan.  He does not have QTc prolongation.  He has a history of hyperplastic gastric polyps found at EGD in September 2022.  I had recommended we consider reassessment at some point and possible banding versus narrowing of the polyps.   Wt Readings from Last 3 Encounters:  12/28/22 187 lb (84.8 kg)  08/16/22 185 lb 2 oz (84 kg)  08/15/22 187 lb (84.8 kg)     Allergies  Allergen Reactions   Metformin Diarrhea and Other (See Comments)    diarrhea   Robaxin [Methocarbamol] Itching and Rash   Current Meds  Medication Sig   Ascorbic Acid (VITAMIN C PO) Take by mouth.   aspirin EC 81 MG tablet Take 81 mg by mouth daily.   B-D UF III MINI PEN NEEDLES 31G X 5 MM MISC Inject into the skin 3 (three) times daily.   B-D UF III MINI PEN NEEDLES 31G X 5 MM MISC CHECK THREE TIMES DAILY DX E11.9   budesonide-formoterol (SYMBICORT) 160-4.5 MCG/ACT inhaler Inhale 2 puffs into the lungs 2 (two) times daily as needed (asthma).   Cholecalciferol (VITAMIN D3) 25 MCG (1000 UT) CAPS Take 1 capsule (1,000 Units total) by mouth daily. Follow-up appt due w/ Dr. Alvy Bimler must see provider for future refills   cyclobenzaprine (FLEXERIL) 10 MG tablet TAKE 1 TABLET BY MOUTH THREE TIMES A DAY AS NEEDED FOR MUSCLE SPASM  DULoxetine (CYMBALTA) 60 MG capsule TAKE 1 CAPSULE BY MOUTH EVERY DAY   ergocalciferol (VITAMIN D2) 1.25 MG (50000 UT) capsule Take 50,000 Units by mouth every Wednesday.   famotidine (PEPCID) 40 MG tablet TAKE 1 TABLET BY MOUTH EVERYDAY AT BEDTIME   FARXIGA 10 MG TABS tablet Take 10 mg by mouth daily.   fenofibrate 160 MG tablet Take 1 tablet (160 mg  total) by mouth daily.   ferrous sulfate 325 (65 FE) MG tablet Take 325 mg by mouth daily with breakfast.   furosemide (LASIX) 20 MG tablet TAKE 1 TABLET (20 MG TOTAL) BY MOUTH DAILY AS NEEDED FOR EDEMA.   glucose blood (ONETOUCH VERIO) test strip CHECK SUGARS 4 TIMES A DAY DX E11.9   insulin aspart (NOVOLOG FLEXPEN) 100 UNIT/ML FlexPen INJECT 3 TIMES A DAY PER SLIDING SCALE UP TO 30 UNITS A DAY DX E11.9   insulin degludec (TRESIBA FLEXTOUCH) 100 UNIT/ML FlexTouch Pen Inject 26 Units into the skin daily.   Insulin Pen Needle (B-D UF III MINI PEN NEEDLES) 31G X 5 MM MISC CHECK THREE TIMES DAILY DX E11.9   Insulin Pen Needle (B-D UF III MINI PEN NEEDLES) 31G X 5 MM MISC CHECK THREE TIMES DAILY DX E11.9   Insulin Syringe-Needle U-100 31G X 5/16" 0.3 ML MISC CHECK THREE TIMES DAILY DX E11.9   Lancets (ONETOUCH DELICA PLUS LANCET33G) MISC Apply topically.   levocetirizine (XYZAL) 5 MG tablet Take 1 tablet (5 mg total) by mouth every evening.   lisinopril (ZESTRIL) 40 MG tablet TAKE 1 TABLET BY MOUTH EVERY DAY   metoprolol tartrate (LOPRESSOR) 25 MG tablet TAKE 1 TABLET BY MOUTH TWICE A DAY   nitroGLYCERIN (NITROSTAT) 0.4 MG SL tablet Place 1 tablet (0.4 mg total) under the tongue every 5 (five) minutes as needed for chest pain.   omeprazole (PRILOSEC) 40 MG capsule TAKE 1 CAPSULE BY MOUTH EVERY DAY   ONETOUCH VERIO test strip 4 (four) times daily.   pravastatin (PRAVACHOL) 40 MG tablet TAKE 1 TABLET BY MOUTH DAILY. PLEASE KEEP UPCOMING APPT IN JANUARY 2023   Past Medical History:  Diagnosis Date   Anemia    Anxiety    Arthritis    BPH (benign prostatic hypertrophy)    Cataract    Chronic renal insufficiency, stage III (moderate) (HCC)    [place] [facility]   Coronary artery disease    Dr Rennis Golden    Depression    Diabetes mellitus without complication (HCC) 1988   insulin dependent   Diabetic autonomic neuropathy (HCC) 03/19/2019   Gastric polyps - hyperplastic 03/19/2019   Gastroparesis due  to DM (HCC)    GERD (gastroesophageal reflux disease)    Hx of adenomatous colonic polyps 06/11/2014   Hyperlipidemia    Hypertension    Myocardial infarction (HCC)    Neuropathy    PVD (peripheral vascular disease) (HCC)    Sleep apnea 2010   refuses to use cpap   Past Surgical History:  Procedure Laterality Date   ANKLE FRACTURE SURGERY Right    CARDIAC CATHETERIZATION  01/30/2012   This demonstrated proximal to mid LAD disease and disease of 2 large OM vessels concerning for surgical disease.   COLONOSCOPY     CORONARY ARTERY BYPASS GRAFT  02/06/2012   Procedure: CORONARY ARTERY BYPASS GRAFTING (CABG); 5- Vessel bypass a LIMA to the LAD, a saphenous vein to the to the diagonal , a saphenous vein to the distal OM, and sequential reverse saphenous vien graft to the posterior descending  and 2nd OM. Surgeon: Delight Ovens, MD;  Location: [facility] OR;  Service: Open Heart Surgery;  Laterality: [name];   ESOPHAGOGASTRODUODENOSCOPY  2005   LEFT HEART CATHETERIZATION WITH CORONARY ANGIOGRAM [name] 01/30/2012   Procedure: LEFT HEART CATHETERIZATION WITH CORONARY ANGIOGRAM;  Surgeon: Chrystie Nose, MD;  Location: [facility] CATH LAB;  Service: Cardiovascular;  Laterality: [name];   teeth removal  08/16/2021   TOOTH EXTRACTION [name] 09/16/2021   Procedure: DENTAL RESTORATION/EXTRACTIONS;  Surgeon: Ocie Doyne, DMD;  Location: [facility] OR;  Service: Oral Surgery;  Laterality: [name];   UPPER GASTROINTESTINAL ENDOSCOPY     Social History   Social History Narrative   Widowed, 1 son and 4 daughters   Disabled and dual eligible Medicare and Medicaid   One caffeinated beverage daily   family history includes Cancer in his sister; Diabetes in his father; Hypertension in his father.   Review of Systems As per HPI  Objective:   Physical Exam @BP  114/72   Pulse (!) 56   Ht 5\' 6"  (1.676 m)   Wt 187 lb (84.8 kg)   SpO2 98%   BMI 30.18 kg/m @  General:  NAD Eyes:   anicteric Lungs:  clear Heart::  S1S2 no  rubs, murmurs or gallops Abdomen:  soft and nontender, BS+  No Tardive dyskinesia   Data Reviewed:  See HPI in summary

## 2022-12-29 ENCOUNTER — Encounter (INDEPENDENT_AMBULATORY_CARE_PROVIDER_SITE_OTHER): Payer: Medicare HMO | Admitting: Ophthalmology

## 2022-12-29 ENCOUNTER — Other Ambulatory Visit: Payer: Self-pay | Admitting: Emergency Medicine

## 2022-12-29 DIAGNOSIS — H35033 Hypertensive retinopathy, bilateral: Secondary | ICD-10-CM

## 2022-12-29 DIAGNOSIS — I10 Essential (primary) hypertension: Secondary | ICD-10-CM

## 2022-12-29 DIAGNOSIS — Z794 Long term (current) use of insulin: Secondary | ICD-10-CM | POA: Diagnosis not present

## 2022-12-29 DIAGNOSIS — E113513 Type 2 diabetes mellitus with proliferative diabetic retinopathy with macular edema, bilateral: Secondary | ICD-10-CM | POA: Diagnosis not present

## 2023-01-12 ENCOUNTER — Other Ambulatory Visit: Payer: Self-pay | Admitting: Emergency Medicine

## 2023-01-12 DIAGNOSIS — F330 Major depressive disorder, recurrent, mild: Secondary | ICD-10-CM

## 2023-01-16 ENCOUNTER — Other Ambulatory Visit: Payer: Self-pay | Admitting: Emergency Medicine

## 2023-01-31 ENCOUNTER — Encounter (HOSPITAL_COMMUNITY): Payer: Self-pay | Admitting: Internal Medicine

## 2023-02-07 NOTE — Anesthesia Preprocedure Evaluation (Signed)
Anesthesia Evaluation  Patient identified by MRN, date of birth, ID band Patient awake    Reviewed: Allergy & Precautions, NPO status , Patient's Chart, lab work & pertinent test results, reviewed documented beta blocker date and time   Airway Mallampati: II  TM Distance: >3 FB Neck ROM: Full    Dental  (+) Missing, Dental Advisory Given,    Pulmonary sleep apnea (noncompliant with CPAP)    Pulmonary exam normal breath sounds clear to auscultation       Cardiovascular hypertension, Pt. on home beta blockers and Pt. on medications + CAD, + Past MI, + CABG and + Peripheral Vascular Disease  Normal cardiovascular exam Rhythm:Regular Rate:Normal  TTE 2019 - Left ventricle: The cavity size was normal. Wall thickness was    increased in a pattern of mild LVH. Systolic function was normal.    The estimated ejection fraction was in the range of 60% to 65%.    Features are consistent with a pseudonormal left ventricular    filling pattern, with concomitant abnormal relaxation and    increased filling pressure (grade 2 diastolic dysfunction)   Stress Test 2019  Nuclear stress EF: 63%. The left ventricular ejection fraction is normal (55-65%).  The study is normal.  This is a low risk study    Neuro/Psych  PSYCHIATRIC DISORDERS Anxiety Depression    negative neurological ROS     GI/Hepatic Neg liver ROS,GERD  ,,  Endo/Other  diabetes, Insulin Dependent    Renal/GU Renal InsufficiencyRenal disease  negative genitourinary   Musculoskeletal negative musculoskeletal ROS (+)    Abdominal   Peds  Hematology negative hematology ROS (+)   Anesthesia Other Findings   Reproductive/Obstetrics                             Anesthesia Physical Anesthesia Plan  ASA: 3  Anesthesia Plan: MAC   Post-op Pain Management:    Induction: Intravenous  PONV Risk Score and Plan: Propofol infusion and Treatment  may vary due to age or medical condition  Airway Management Planned: Natural Airway  Additional Equipment:   Intra-op Plan:   Post-operative Plan:   Informed Consent: I have reviewed the patients History and Physical, chart, labs and discussed the procedure including the risks, benefits and alternatives for the proposed anesthesia with the patient or authorized representative who has indicated his/her understanding and acceptance.     Dental advisory given  Plan Discussed with: CRNA  Anesthesia Plan Comments:        Anesthesia Quick Evaluation

## 2023-02-08 ENCOUNTER — Other Ambulatory Visit: Payer: Self-pay

## 2023-02-08 ENCOUNTER — Ambulatory Visit (HOSPITAL_BASED_OUTPATIENT_CLINIC_OR_DEPARTMENT_OTHER): Payer: Self-pay | Admitting: Anesthesiology

## 2023-02-08 ENCOUNTER — Encounter (HOSPITAL_COMMUNITY): Payer: Self-pay | Admitting: Internal Medicine

## 2023-02-08 ENCOUNTER — Ambulatory Visit (HOSPITAL_COMMUNITY): Payer: Self-pay | Admitting: Anesthesiology

## 2023-02-08 ENCOUNTER — Encounter (HOSPITAL_COMMUNITY)
Admission: RE | Disposition: A | Discharge status: Home / Self Care | Payer: Self-pay | Source: Home / Self Care | Attending: Internal Medicine

## 2023-02-08 ENCOUNTER — Ambulatory Visit (HOSPITAL_COMMUNITY)
Admission: RE | Admit: 2023-02-08 | Discharge: 2023-02-08 | Disposition: A | Discharge status: Home / Self Care | Payer: Medicare HMO | Attending: Internal Medicine | Admitting: Internal Medicine

## 2023-02-08 DIAGNOSIS — E1122 Type 2 diabetes mellitus with diabetic chronic kidney disease: Secondary | ICD-10-CM | POA: Insufficient documentation

## 2023-02-08 DIAGNOSIS — I252 Old myocardial infarction: Secondary | ICD-10-CM | POA: Insufficient documentation

## 2023-02-08 DIAGNOSIS — Z860101 Personal history of adenomatous and serrated colon polyps: Secondary | ICD-10-CM | POA: Insufficient documentation

## 2023-02-08 DIAGNOSIS — Z951 Presence of aortocoronary bypass graft: Secondary | ICD-10-CM | POA: Diagnosis not present

## 2023-02-08 DIAGNOSIS — Z833 Family history of diabetes mellitus: Secondary | ICD-10-CM | POA: Insufficient documentation

## 2023-02-08 DIAGNOSIS — E1143 Type 2 diabetes mellitus with diabetic autonomic (poly)neuropathy: Secondary | ICD-10-CM | POA: Diagnosis not present

## 2023-02-08 DIAGNOSIS — G4730 Sleep apnea, unspecified: Secondary | ICD-10-CM | POA: Insufficient documentation

## 2023-02-08 DIAGNOSIS — Z7984 Long term (current) use of oral hypoglycemic drugs: Secondary | ICD-10-CM | POA: Insufficient documentation

## 2023-02-08 DIAGNOSIS — Z91198 Patient's noncompliance with other medical treatment and regimen for other reason: Secondary | ICD-10-CM | POA: Insufficient documentation

## 2023-02-08 DIAGNOSIS — I2510 Atherosclerotic heart disease of native coronary artery without angina pectoris: Secondary | ICD-10-CM | POA: Diagnosis not present

## 2023-02-08 DIAGNOSIS — Z7951 Long term (current) use of inhaled steroids: Secondary | ICD-10-CM | POA: Diagnosis not present

## 2023-02-08 DIAGNOSIS — K317 Polyp of stomach and duodenum: Secondary | ICD-10-CM | POA: Diagnosis present

## 2023-02-08 DIAGNOSIS — N183 Chronic kidney disease, stage 3 unspecified: Secondary | ICD-10-CM | POA: Insufficient documentation

## 2023-02-08 DIAGNOSIS — E1151 Type 2 diabetes mellitus with diabetic peripheral angiopathy without gangrene: Secondary | ICD-10-CM | POA: Diagnosis not present

## 2023-02-08 DIAGNOSIS — K3184 Gastroparesis: Secondary | ICD-10-CM | POA: Diagnosis not present

## 2023-02-08 DIAGNOSIS — I129 Hypertensive chronic kidney disease with stage 1 through stage 4 chronic kidney disease, or unspecified chronic kidney disease: Secondary | ICD-10-CM | POA: Insufficient documentation

## 2023-02-08 DIAGNOSIS — Z7982 Long term (current) use of aspirin: Secondary | ICD-10-CM | POA: Diagnosis not present

## 2023-02-08 DIAGNOSIS — Z794 Long term (current) use of insulin: Secondary | ICD-10-CM | POA: Insufficient documentation

## 2023-02-08 DIAGNOSIS — K219 Gastro-esophageal reflux disease without esophagitis: Secondary | ICD-10-CM | POA: Insufficient documentation

## 2023-02-08 DIAGNOSIS — K3189 Other diseases of stomach and duodenum: Secondary | ICD-10-CM | POA: Diagnosis not present

## 2023-02-08 HISTORY — PX: ESOPHAGOGASTRODUODENOSCOPY: SHX5428

## 2023-02-08 HISTORY — PX: GASTRIC VARICES BANDING: SHX5519

## 2023-02-08 HISTORY — PX: BIOPSY: SHX5522

## 2023-02-08 LAB — GLUCOSE, CAPILLARY: Glucose-Capillary: 84 mg/dL (ref 70–99)

## 2023-02-08 SURGERY — EGD (ESOPHAGOGASTRODUODENOSCOPY)
Anesthesia: Monitor Anesthesia Care

## 2023-02-08 MED ORDER — PROPOFOL 500 MG/50ML IV EMUL
INTRAVENOUS | Status: AC
Start: 2023-02-08 — End: ?
  Filled 2023-02-08: qty 50

## 2023-02-08 MED ORDER — SODIUM CHLORIDE 0.9 % IV SOLN: INTRAVENOUS | Status: DC

## 2023-02-08 MED ORDER — LABETALOL HCL 5 MG/ML IV SOLN
10.0000 mg | Freq: Once | INTRAVENOUS | Status: AC
  Administered 2023-02-08: 10 mg via INTRAVENOUS

## 2023-02-08 MED ORDER — LABETALOL HCL 5 MG/ML IV SOLN
INTRAVENOUS | Status: AC
Start: 2023-02-08 — End: ?
  Filled 2023-02-08: qty 4

## 2023-02-08 MED ORDER — PROPOFOL 500 MG/50ML IV EMUL
INTRAVENOUS | Status: DC | PRN
  Administered 2023-02-08: 150 ug/kg/min via INTRAVENOUS

## 2023-02-08 MED ORDER — LIDOCAINE 2% (20 MG/ML) 5 ML SYRINGE
INTRAMUSCULAR | Status: DC | PRN
  Administered 2023-02-08: 100 mg via INTRAVENOUS

## 2023-02-08 MED ORDER — PROPOFOL 10 MG/ML IV BOLUS
INTRAVENOUS | Status: DC | PRN
  Administered 2023-02-08 (×2): 20 mg via INTRAVENOUS

## 2023-02-08 NOTE — Transfer of Care (Signed)
Immediate Anesthesia Transfer of Care Note  Patient: Joseph [name]  Procedure(s) Performed: ESOPHAGOGASTRODUODENOSCOPY (EGD) POLYPECTOMY GASTRIC POLYPS BANDING  Patient Location: PACU and Endoscopy Unit  Anesthesia Type:MAC  Level of Consciousness: awake, drowsy, and patient cooperative  Airway & Oxygen Therapy: Patient Spontanous Breathing and Patient connected to face mask oxygen  Post-op Assessment: Report given to RN and Post -op Vital signs reviewed and stable  Post vital signs: Reviewed and stable  Last Vitals:  Vitals Value Taken Time  BP 140/62 02/08/23 1012  Temp    Pulse 58 02/08/23 1014  Resp 14 02/08/23 1014  SpO2 100 % 02/08/23 1014  Vitals shown include unfiled device data.  Last Pain:  Vitals:   02/08/23 0804  TempSrc: Temporal  PainSc: 0-No pain         Complications: No notable events documented.

## 2023-02-08 NOTE — Anesthesia Procedure Notes (Signed)
Procedure Name: MAC Date/Time: 02/08/2023 9:48 AM  Performed by: Sindy Guadeloupe, CRNAPre-anesthesia Checklist: Patient identified, Emergency Drugs available, Suction available, Patient being monitored and Timeout performed Oxygen Delivery Method: Simple face mask

## 2023-02-08 NOTE — Op Note (Signed)
[facility] Patient Name: Joseph [name] Procedure Date: 02/08/2023 MRN: [id number] Attending MD: Iva Boop , MD, [id number] Date of Birth: 10/04/1955 CSN: [id number] Age: 67 Admit Type: Outpatient Procedure:                Upper GI endoscopy Indications:              For therapy of gastric polyps Providers:                Iva Boop, MD, Lorenza Evangelist, RN, Kandice Robinsons, Technician, Geoffery Lyons, Technician Referring MD:              Medicines:                Monitored Anesthesia Care Complications:            No immediate complications. Estimated Blood Loss:     Estimated blood loss was minimal. Procedure:                Pre-Anesthesia Assessment:                           - Prior to the procedure, a History and Physical                            was performed, and patient medications and                            allergies were reviewed. The patient's tolerance of                            previous anesthesia was also reviewed. The risks                            and benefits of the procedure and the sedation                            options and risks were discussed with the patient.                            All questions were answered, and informed consent                            was obtained. Prior Anticoagulants: The patient has                            taken no anticoagulant or antiplatelet agents. ASA                            Grade Assessment: III - A patient with severe                            systemic disease. After reviewing the risks and  benefits, the patient was deemed in satisfactory                            condition to undergo the procedure.                           After obtaining informed consent, the endoscope was                            passed under direct vision. Throughout the                            procedure, the patient's blood pressure, pulse, and                             oxygen saturations were monitored continuously. The                            [id number] ([id number]) Olympus therapeutic endoscope                            was introduced through the mouth, and advanced to                            the second part of duodenum. The upper GI endoscopy                            was accomplished without difficulty. The patient                            tolerated the procedure well. Scope In: Scope Out: Findings:      Multiple 1 to 8 mm sessile polyps with stigmata of recent bleeding were       found in the gastric body. Biopsies were taken with a cold forceps for       histology. Verification of patient identification for the specimen was       done. Estimated blood loss was minimal. Five bands were successfully       placed. Verification of patient identification for the specimen was       done. Estimated blood loss: none.      Patchy moderately erythematous mucosa was found in the gastric antrum.      The exam was otherwise without abnormality.      The cardia and gastric fundus were normal on retroflexion. Impression:               - Multiple gastric polyps. Biopsied. Banded. The 5                            largest polyps were biopsied and banded to ablate.                            largest was about 8 mm and was semi-pedunculated,                            remainder  were sessile. estimate ten 1-3 mm sessile                            polyps remain.                           - Erythematous mucosa in the antrum.                           - The examination was otherwise normal. Moderate Sedation:      Not Applicable - Patient had care per Anesthesia. Recommendation:           - Patient has a contact number available for                            emergencies. The signs and symptoms of potential                            delayed complications were discussed with the                            patient. Return to normal  activities tomorrow.                            Written discharge instructions were provided to the                            patient.                           - Resume previous diet.                           - Continue present medications.                           - Await pathology results. Procedure Code(s):        --- Professional ---                           [phone], Esophagogastroduodenoscopy, flexible,                            transoral; with biopsy, single or multiple Diagnosis Code(s):        --- Professional ---                           K31.7, Polyp of stomach and duodenum                           K31.89, Other diseases of stomach and duodenum CPT copyright 2022 American Medical Association. All rights reserved. The codes documented in this report are preliminary and upon coder review may  be revised to meet current compliance requirements. Iva Boop, MD 02/08/2023 10:27:48 AM This report has been signed electronically. Number of Addenda: 0

## 2023-02-08 NOTE — Discharge Instructions (Signed)
I biopsied and then placed rubber bands on 5 polyps to destroy them. All look benign.  I will contact with results and plans.  I appreciate the opportunity to care for you. Iva Boop, MD, FACG  YOU HAD AN ENDOSCOPIC PROCEDURE TODAY: Refer to the procedure report and other information in the discharge instructions given to you for any specific questions about what was found during the examination. If this information does not answer your questions, please call Dr. Marvell Fuller office at [phone] to clarify.   YOU SHOULD EXPECT: Some feelings of bloating in the abdomen. Passage of more gas than usual. Walking can help get rid of the air that was put into your GI tract during the procedure and reduce the bloating. If you had a lower endoscopy (such as a colonoscopy or flexible sigmoidoscopy) you may notice spotting of blood in your stool or on the toilet paper. Some abdominal soreness may be present for a day or two, also.  DIET: Your first meal following the procedure should be a light meal and then it is ok to progress to your normal diet. A half-sandwich or bowl of soup is an example of a good first meal. Heavy or fried foods are harder to digest and may make you feel nauseous or bloated. Drink plenty of fluids but you should avoid alcoholic beverages for 24 hours.   ACTIVITY: Your care partner should take you home directly after the procedure. You should plan to take it easy, moving slowly for the rest of the day. You can resume normal activity the day after the procedure however YOU SHOULD NOT DRIVE, use power tools, machinery or perform tasks that involve climbing or major physical exertion for 24 hours (because of the sedation medicines used during the test).   SYMPTOMS TO REPORT IMMEDIATELY: A gastroenterologist can be reached at any hour. Please call [phone]  for any of the following symptoms:   Following upper endoscopy (EGD, EUS, ERCP, esophageal dilation) Vomiting of blood or  coffee ground material  New, significant abdominal pain  New, significant chest pain or pain under the shoulder blades  Painful or persistently difficult swallowing  New shortness of breath  Black, tarry-looking or red, bloody stools  FOLLOW UP:  If any biopsies were taken you will be contacted by phone or by letter within the next 1-3 weeks. Call [phone]  if you have not heard about the biopsies in 3 weeks.  Please also call with any specific questions about appointments or follow up tests.

## 2023-02-08 NOTE — Anesthesia Postprocedure Evaluation (Signed)
Anesthesia Post Note  Patient: Joseph [name]  Procedure(s) Performed: ESOPHAGOGASTRODUODENOSCOPY (EGD) POLYPECTOMY GASTRIC POLYPS BANDING     Patient location during evaluation: Endoscopy Anesthesia Type: MAC Level of consciousness: awake and alert Pain management: pain level controlled Vital Signs Assessment: post-procedure vital signs reviewed and stable Respiratory status: spontaneous breathing, nonlabored ventilation, respiratory function stable and patient connected to nasal cannula oxygen Cardiovascular status: blood pressure returned to baseline and stable Postop Assessment: no apparent nausea or vomiting Anesthetic complications: no  No notable events documented.  Last Vitals:  Vitals:   02/08/23 1020 02/08/23 1030  BP: (!) 176/70 (!) 198/69  Pulse: (!) 59 61  Resp: (!) 21 12  Temp:    SpO2: 100% 100%    Last Pain:  Vitals:   02/08/23 0804  TempSrc: Temporal  PainSc: 0-No pain                 Yanette Tripoli L Reva Pinkley

## 2023-02-08 NOTE — Progress Notes (Signed)
Dr Armond Hang notified of pt's high bp in recovery.  Dr Armond Hang ok to discharge pt and encouraged pt to take his bp meds as soon as he gets home.  Pt understood.

## 2023-02-08 NOTE — H&P (Signed)
[facility] History and Physical   Primary Care Physician:  Georgina Quint, MD   Reason for Procedure:   Gastric polyps  Plan:    EGD, possible polyp removal/banding     HPI: Joseph [name] is a 67 y.o. male w/ hx gastroparesis and hyperplastic gastric polyps - here for reassessment of the polyps and possible removal vs banding or both.  BP elevated and treated by anesthesia but not w/ sxs.   Past Medical History:  Diagnosis Date   Anemia    Anxiety    Arthritis    BPH (benign prostatic hypertrophy)    Cataract    Chronic renal insufficiency, stage III (moderate) (HCC)    [place] [facility]   Coronary artery disease    Dr Rennis Golden    Depression    Diabetes mellitus without complication (HCC) 1988   insulin dependent   Diabetic autonomic neuropathy (HCC) 03/19/2019   Gastric polyps - hyperplastic 03/19/2019   Gastroparesis due to DM (HCC)    GERD (gastroesophageal reflux disease)    Hx of adenomatous colonic polyps 06/11/2014   Hyperlipidemia    Hypertension    Myocardial infarction (HCC)    Neuropathy    PVD (peripheral vascular disease) (HCC)    Sleep apnea 2010   refuses to use cpap    Past Surgical History:  Procedure Laterality Date   ANKLE FRACTURE SURGERY Right    CARDIAC CATHETERIZATION  01/30/2012   This demonstrated proximal to mid LAD disease and disease of 2 large OM vessels concerning for surgical disease.   COLONOSCOPY     CORONARY ARTERY BYPASS GRAFT  02/06/2012   Procedure: CORONARY ARTERY BYPASS GRAFTING (CABG); 5- Vessel bypass a LIMA to the LAD, a saphenous vein to the to the diagonal , a saphenous vein to the distal OM, and sequential reverse saphenous vien graft to the posterior descending and 2nd OM. Surgeon: Delight Ovens, MD;  Location: [facility] OR;  Service: Open Heart Surgery;  Laterality: [name];   ESOPHAGOGASTRODUODENOSCOPY  2005   LEFT HEART CATHETERIZATION WITH CORONARY ANGIOGRAM [name] 01/30/2012   Procedure: LEFT HEART  CATHETERIZATION WITH CORONARY ANGIOGRAM;  Surgeon: Chrystie Nose, MD;  Location: [facility] CATH LAB;  Service: Cardiovascular;  Laterality: [name];   teeth removal  08/16/2021   TOOTH EXTRACTION [name] 09/16/2021   Procedure: DENTAL RESTORATION/EXTRACTIONS;  Surgeon: Ocie Doyne, DMD;  Location: [facility] OR;  Service: Oral Surgery;  Laterality: [name];   UPPER GASTROINTESTINAL ENDOSCOPY      Prior to Admission medications   Medication Sig Start Date End Date Taking? Authorizing Provider  Ascorbic Acid (VITAMIN C PO) Take by mouth.   Yes [provider]  aspirin EC 81 MG tablet Take 81 mg by mouth daily.   Yes [provider]  cyclobenzaprine (FLEXERIL) 10 MG tablet TAKE 1 TABLET BY MOUTH THREE TIMES A DAY AS NEEDED FOR MUSCLE SPASM 12/20/22  Yes Iva Boop, MD  DULoxetine (CYMBALTA) 60 MG capsule TAKE 1 CAPSULE BY MOUTH EVERY DAY 01/12/23  Yes Georgina Quint, MD  famotidine (PEPCID) 40 MG tablet TAKE 1 TABLET BY MOUTH EVERYDAY AT BEDTIME 11/29/22  Yes Sagardia, Eilleen Kempf, MD  FARXIGA 10 MG TABS tablet Take 10 mg by mouth daily. 12/14/20  Yes [provider]  fenofibrate 160 MG tablet TAKE 1 TABLET BY MOUTH EVERY DAY 01/16/23  Yes Sagardia, Eilleen Kempf, MD  ferrous sulfate 325 (65 FE) MG tablet Take 325 mg by mouth daily with breakfast.   Yes [provider]  furosemide (LASIX) 20 MG tablet TAKE 1 TABLET (20 MG TOTAL) BY MOUTH DAILY AS NEEDED FOR EDEMA. 11/06/22  Yes [name], Lisette Abu, MD  insulin aspart (NOVOLOG FLEXPEN) 100 UNIT/ML FlexPen INJECT 3 TIMES A DAY PER SLIDING SCALE UP TO 30 UNITS A DAY DX E11.9 05/09/22  Yes Sagardia, Eilleen Kempf, MD  insulin degludec (TRESIBA FLEXTOUCH) 100 UNIT/ML FlexTouch Pen Inject 26 Units into the skin daily. 10/09/22  Yes Sagardia, Eilleen Kempf, MD  levocetirizine (XYZAL) 5 MG tablet TAKE 1 TABLET BY MOUTH EVERY DAY IN THE EVENING 01/12/23  Yes Sagardia, Eilleen Kempf, MD  lisinopril (ZESTRIL) 40 MG tablet TAKE 1 TABLET BY MOUTH EVERY  DAY 12/29/22  Yes Sagardia, Eilleen Kempf, MD  metoprolol tartrate (LOPRESSOR) 25 MG tablet TAKE 1 TABLET BY MOUTH TWICE A DAY 11/05/22  Yes Sagardia, Eilleen Kempf, MD  omeprazole (PRILOSEC) 40 MG capsule TAKE 1 CAPSULE BY MOUTH EVERY DAY 11/05/22  Yes Sagardia, Eilleen Kempf, MD  pravastatin (PRAVACHOL) 40 MG tablet TAKE 1 TABLET BY MOUTH DAILY. PLEASE KEEP UPCOMING APPT IN JANUARY 2023 12/01/22  Yes [name], Lisette Abu, MD  B-D UF III MINI PEN NEEDLES 31G X 5 MM MISC Inject into the skin 3 (three) times daily. 12/06/20   [provider]  B-D UF III MINI PEN NEEDLES 31G X 5 MM MISC CHECK THREE TIMES DAILY DX E11.9 12/22/21   Georgina Quint, MD  budesonide-formoterol [facility]) 160-4.5 MCG/ACT inhaler Inhale 2 puffs into the lungs 2 (two) times daily as needed (asthma).    [provider]  Cholecalciferol (VITAMIN D3) 25 MCG (1000 UT) CAPS Take 1 capsule (1,000 Units total) by mouth daily. Follow-up appt due w/ Dr. Alvy Bimler must see provider for future refills 07/27/22   Georgina Quint, MD  ergocalciferol (VITAMIN D2) 1.25 MG (50000 UT) capsule Take 50,000 Units by mouth every Wednesday. 04/09/20   [provider]  glucose blood (ONETOUCH VERIO) test strip CHECK SUGARS 4 TIMES A DAY DX E11.9 11/26/20   [provider]  Insulin Pen Needle (B-D UF III MINI PEN NEEDLES) 31G X 5 MM MISC CHECK THREE TIMES DAILY DX E11.9 05/10/20   [provider]  Insulin Pen Needle (B-D UF III MINI PEN NEEDLES) 31G X 5 MM MISC CHECK THREE TIMES DAILY DX E11.9 05/10/20   [provider]  Insulin Syringe-Needle U-100 31G X 5/16" 0.3 ML MISC CHECK THREE TIMES DAILY DX E11.9 05/10/20   [provider]  Lancets (ONETOUCH DELICA PLUS LANCET33G) MISC Apply topically. 12/10/20   [provider]  metoCLOPramide (REGLAN) 10 MG tablet Take 1 tablet (10 mg total) by mouth every 6 (six) hours as needed. for nausea Patient not taking: Reported on 12/28/2022 04/12/22    Georgina Quint, MD  nitroGLYCERIN (NITROSTAT) 0.4 MG SL tablet Place 1 tablet (0.4 mg total) under the tongue every 5 (five) minutes as needed for chest pain. 08/10/20   [name], Lisette Abu, MD  [facility] VERIO test strip 4 (four) times daily. 11/26/20   [provider]    Current Facility-Administered Medications  Medication Dose Route Frequency Provider Last Rate Last Admin   0.9 %  sodium chloride infusion   Intravenous Continuous Iva Boop, MD        Allergies as of 12/28/2022 - Review Complete 12/28/2022  Allergen Reaction Noted   Metformin Diarrhea and Other (See Comments) 07/17/2019   Robaxin [methocarbamol] Itching and Rash 03/11/2019    Family History  Problem Relation  Age of Onset   Diabetes Father    Hypertension Father    Cancer Sister        unknown   Colon cancer Neg Hx    Esophageal cancer Neg Hx    Rectal cancer Neg Hx    Stomach cancer Neg Hx    Pancreatic cancer Neg Hx    Sleep apnea Neg Hx     Social History   Socioeconomic History   Marital status: Widowed    Spouse name: Not on file   Number of children: 4   Years of education: Not on file   Highest education level: 5th grade  Occupational History   Occupation: disabled  Tobacco Use   Smoking status: Never   Smokeless tobacco: Never  Vaping Use   Vaping status: Never Used  Substance and Sexual Activity   Alcohol use: No   Drug use: No   Sexual activity: Not Currently  Other Topics Concern   Not on file  Social History Narrative   Widowed, 1 son and 4 daughters   Disabled and dual eligible Medicare and Medicaid   One caffeinated beverage daily   Social Determinants of Health   Financial Resource Strain: Patient Declined (08/16/2022)   Overall Financial Resource Strain (CARDIA)    Difficulty of Paying Living Expenses: Patient declined  Food Insecurity: Food Insecurity Present (08/16/2022)   Hunger Vital Sign    Worried About Running Out of Food in the Last Year: Often true     Ran Out of Food in the Last Year: Patient declined  Transportation Needs: No Transportation Needs (08/16/2022)   PRAPARE - Administrator, Civil Service (Medical): No    Lack of Transportation (Non-Medical): No  Physical Activity: Unknown (08/16/2022)   Exercise Vital Sign    Days of Exercise per Week: Patient declined    Minutes of Exercise per Session: Not on file  Stress: Stress Concern Present (08/16/2022)   [employer] of Occupational Health - Occupational Stress Questionnaire    Feeling of Stress : To some extent  Social Connections: Unknown (08/16/2022)   Social Connection and Isolation Panel [NHANES]    Frequency of Communication with Friends and Family: More than three times a week    Frequency of Social Gatherings with Friends and Family: More than three times a week    Attends Religious Services: Patient declined    Database administrator or Organizations: No    Attends Banker Meetings: Not on file    Marital Status: Widowed  Intimate Partner Violence: Not At Risk (02/03/2022)   Humiliation, Afraid, Rape, and Kick questionnaire    Fear of Current or Ex-Partner: No    Emotionally Abused: No    Physically Abused: No    Sexually Abused: No    Review of Systems:  All other review of systems negative except as mentioned in the HPI.  Physical Exam: Vital signs BP (!) 195/73   Pulse 68   Temp 97.9 F (36.6 C) (Temporal)   Resp 18   Ht 5\' 6"  (1.676 m)   Wt 84.8 kg   SpO2 100%   BMI 30.18 kg/m   General:   Alert,  Well-developed, well-nourished, pleasant and cooperative in NAD Lungs:  Clear throughout to auscultation.   Heart:  Regular rate and rhythm; no murmurs, clicks, rubs,  or gallops. Abdomen:  Soft, nontender and nondistended. Normal bowel sounds.   Neuro/Psych:  Alert and cooperative. Normal mood and affect. A and O  x 3   @Beauford Lando  Sena Slate, MD, [name] [phone] (pager) 02/08/2023 8:52 AM@

## 2023-02-09 ENCOUNTER — Encounter (INDEPENDENT_AMBULATORY_CARE_PROVIDER_SITE_OTHER): Payer: Medicare HMO | Admitting: Ophthalmology

## 2023-02-09 DIAGNOSIS — H35033 Hypertensive retinopathy, bilateral: Secondary | ICD-10-CM

## 2023-02-09 DIAGNOSIS — Z794 Long term (current) use of insulin: Secondary | ICD-10-CM | POA: Diagnosis not present

## 2023-02-09 DIAGNOSIS — E113513 Type 2 diabetes mellitus with proliferative diabetic retinopathy with macular edema, bilateral: Secondary | ICD-10-CM | POA: Diagnosis not present

## 2023-02-09 DIAGNOSIS — I10 Essential (primary) hypertension: Secondary | ICD-10-CM

## 2023-02-09 LAB — SURGICAL PATHOLOGY

## 2023-02-11 ENCOUNTER — Encounter (HOSPITAL_COMMUNITY): Payer: Self-pay | Admitting: Internal Medicine

## 2023-02-12 ENCOUNTER — Encounter: Payer: Self-pay | Admitting: Internal Medicine

## 2023-02-15 ENCOUNTER — Ambulatory Visit: Payer: Medicare HMO | Admitting: Emergency Medicine

## 2023-02-28 LAB — LAB REPORT - SCANNED
Creatinine, POC: 62.4 mg/dL
EGFR: 58

## 2023-03-12 NOTE — Progress Notes (Signed)
[facility] [address] [place]. [place] [address] [phone]       OFFICE FOLLOW UP NOTE  [name] Date of Birth:  13-Dec-1955 Medical Record Number:  [id number]    Primary neurologist: Dr. Frances [name] Reason for visit: Initial CPAP follow-up    SUBJECTIVE:   CHIEF COMPLAINT:  Chief Complaint  Patient presents with   Follow-up    Patient in room #3 with his daughter. Patient daughter states the pt still sleeping during the day but he not snoring at night.    Follow-up visit:  Prior visit: 05/22/2022 Dr. Frances [name] (initial visit)  Brief HPI:   Joseph [name] is a 67 y.o. male who was seen by Dr. Frances [name] on 05/22/2022 for evaluation of sleep apnea.  Complex medical history of anemia, arthritis, BPH, chronic renal insufficiency, coronary artery disease with history of MI, status post 5 vessel CABG in 2013, depression, anxiety, diabetes, neuropathy, history of gastroparesis, reflux disease, hypertension, hyperlipidemia, neuropathy, PVD, and mild obesity, previously diagnosed with OSA over 20 years ago and recommended CPAP but he has not been on CPAP for years.  ESS 15/24. FSS 57/63.  Sleep study 12/05/2022 showed severe sleep apnea with total AHI 52.3/h and O2 nadir of 68% with evidence of nocturnal hypoxemia and qualified for split sleep study, responded well to PAP therapy however optimal pressure was not determined due to significant sleep fragmentation and was started on AutoPap.  AutoPap initiated 12/25/2022.     Interval history:  He is accompanied today by his daughter. Reports tolerating CPAP well, currently using nasal pillow, was unable to tolerate FFM.  He has not been using his CPAP over the past month for unclear reasons, he had no specific reason or concern for not using.  Family has noted that he is no longer snoring with CPAP use.  Still has daytime fatigue although ESS today 8/24 and prior to CPAP 15/24.  Blood pressure today elevated, reports  being monitored at home and typically stable but unable to say average readings.           ROS:   14 system review of systems performed and negative with exception of those listed in HPI  PMH:  Past Medical History:  Diagnosis Date   Anemia    Anxiety    Arthritis    BPH (benign prostatic hypertrophy)    Cataract    Chronic renal insufficiency, stage III (moderate) (HCC)    [place] [facility]   Coronary artery disease    Dr Joseph [name]    Depression    Diabetes mellitus without complication (HCC) 1988   insulin dependent   Diabetic autonomic neuropathy (HCC) 03/19/2019   Gastric polyps - hyperplastic 03/19/2019   Gastroparesis due to DM (HCC)    GERD (gastroesophageal reflux disease)    Hx of adenomatous colonic polyps 06/11/2014   Hyperlipidemia    Hypertension    Myocardial infarction (HCC)    Neuropathy    PVD (peripheral vascular disease) (HCC)    Sleep apnea 2010   refuses to use cpap    PSH:  Past Surgical History:  Procedure Laterality Date   ANKLE FRACTURE SURGERY Right    BIOPSY  02/08/2023   Procedure: BIOPSY;  Surgeon: Joseph Boop, MD;  Location: [facility] ENDOSCOPY;  Service: Gastroenterology;;   CARDIAC CATHETERIZATION  01/30/2012   This demonstrated proximal to mid LAD disease and disease of 2 large OM vessels concerning for surgical disease.   COLONOSCOPY     CORONARY  ARTERY BYPASS GRAFT  02/06/2012   Procedure: CORONARY ARTERY BYPASS GRAFTING (CABG); 5- Vessel bypass a LIMA to the LAD, a saphenous vein to the to the diagonal , a saphenous vein to the distal OM, and sequential reverse saphenous vien graft to the posterior descending and 2nd OM. Surgeon: Joseph Ovens, MD;  Location: [facility] OR;  Service: Open Heart Surgery;  Laterality: [name];   ESOPHAGOGASTRODUODENOSCOPY  2005   ESOPHAGOGASTRODUODENOSCOPY [name] 02/08/2023   Procedure: ESOPHAGOGASTRODUODENOSCOPY (EGD);  Surgeon: Joseph Boop, MD;  Location: [facility];  Service: Gastroenterology;  Laterality:  [name];   GASTRIC VARICES BANDING  02/08/2023   Procedure: GASTRIC POLYPS BANDING;  Surgeon: Joseph Boop, MD;  Location: [facility];  Service: Gastroenterology;;   LEFT HEART CATHETERIZATION WITH CORONARY ANGIOGRAM [name] 01/30/2012   Procedure: LEFT HEART CATHETERIZATION WITH CORONARY ANGIOGRAM;  Surgeon: Joseph Nose, MD;  Location: [facility] CATH LAB;  Service: Cardiovascular;  Laterality: [name];   teeth removal  08/16/2021   TOOTH EXTRACTION [name] 09/16/2021   Procedure: DENTAL RESTORATION/EXTRACTIONS;  Surgeon: Joseph [name], DMD;  Location: [facility] OR;  Service: Oral Surgery;  Laterality: [name];   UPPER GASTROINTESTINAL ENDOSCOPY      Social History:  Social History   Socioeconomic History   Marital status: Widowed    Spouse name: Not on file   Number of children: 4   Years of education: Not on file   Highest education level: 5th grade  Occupational History   Occupation: disabled  Tobacco Use   Smoking status: Never   Smokeless tobacco: Never  Vaping Use   Vaping status: Never Used  Substance and Sexual Activity   Alcohol use: No   Drug use: No   Sexual activity: Not Currently  Other Topics Concern   Not on file  Social History Narrative   Widowed, 1 son and 4 daughters   Disabled and dual eligible Medicare and Medicaid   One caffeinated beverage daily   Social Drivers of Health   Financial Resource Strain: Patient Declined (08/16/2022)   Overall Financial Resource Strain (CARDIA)    Difficulty of Paying Living Expenses: Patient declined  Food Insecurity: Food Insecurity Present (08/16/2022)   Hunger Vital Sign    Worried About Running Out of Food in the Last Year: Often true    Ran Out of Food in the Last Year: Patient declined  Transportation Needs: No Transportation Needs (08/16/2022)   PRAPARE - Administrator, Civil Service (Medical): No    Lack of Transportation (Non-Medical): No  Physical Activity: Unknown (08/16/2022)   Exercise Vital Sign    Days of  Exercise per Week: Patient declined    Minutes of Exercise per Session: Not on file  Stress: Stress Concern Present (08/16/2022)   [employer] of Occupational Health - Occupational Stress Questionnaire    Feeling of Stress : To some extent  Social Connections: Unknown (08/16/2022)   Social Connection and Isolation Panel [NHANES]    Frequency of Communication with Friends and Family: More than three times a week    Frequency of Social Gatherings with Friends and Family: More than three times a week    Attends Religious Services: Patient declined    Database administrator or Organizations: No    Attends Banker Meetings: Not on file    Marital Status: Widowed  Intimate Partner Violence: Not At Risk (02/03/2022)   Humiliation, Afraid, Rape, and Kick questionnaire    Fear of Current or Ex-Partner: No  Emotionally Abused: No    Physically Abused: No    Sexually Abused: No    Family History:  Family History  Problem Relation Age of Onset   Diabetes Father    Hypertension Father    Cancer Sister        unknown   Colon cancer Neg Hx    Esophageal cancer Neg Hx    Rectal cancer Neg Hx    Stomach cancer Neg Hx    Pancreatic cancer Neg Hx    Sleep apnea Neg Hx     Medications:   Current Outpatient Medications on File Prior to Visit  Medication Sig Dispense Refill   Ascorbic Acid (VITAMIN C PO) Take by mouth.     aspirin EC 81 MG tablet Take 81 mg by mouth daily.     B-D UF III MINI PEN NEEDLES 31G X 5 MM MISC Inject into the skin 3 (three) times daily.     B-D UF III MINI PEN NEEDLES 31G X 5 MM MISC CHECK THREE TIMES DAILY DX E11.9 300 each 1   budesonide-formoterol (SYMBICORT) 160-4.5 MCG/ACT inhaler Inhale 2 puffs into the lungs 2 (two) times daily as needed (asthma).     Cholecalciferol (VITAMIN D3) 25 MCG (1000 UT) CAPS Take 1 capsule (1,000 Units total) by mouth daily. Follow-up appt due w/ Dr. Alvy Bimler must see provider for future refills 90 capsule 0    cyclobenzaprine (FLEXERIL) 10 MG tablet TAKE 1 TABLET BY MOUTH THREE TIMES A DAY AS NEEDED FOR MUSCLE SPASM 90 tablet 3   DULoxetine (CYMBALTA) 60 MG capsule TAKE 1 CAPSULE BY MOUTH EVERY DAY 90 capsule 1   ergocalciferol (VITAMIN D2) 1.25 MG (50000 UT) capsule Take 50,000 Units by mouth every Wednesday.     famotidine (PEPCID) 40 MG tablet TAKE 1 TABLET BY MOUTH EVERYDAY AT BEDTIME 90 tablet 3   FARXIGA 10 MG TABS tablet Take 10 mg by mouth daily.     fenofibrate 160 MG tablet TAKE 1 TABLET BY MOUTH EVERY DAY 90 tablet 3   ferrous sulfate 325 (65 FE) MG tablet Take 325 mg by mouth daily with breakfast.     furosemide (LASIX) 20 MG tablet TAKE 1 TABLET (20 MG TOTAL) BY MOUTH DAILY AS NEEDED FOR EDEMA. 90 tablet 3   glucose blood (ONETOUCH VERIO) test strip CHECK SUGARS 4 TIMES A DAY DX E11.9     insulin aspart (NOVOLOG FLEXPEN) 100 UNIT/ML FlexPen INJECT 3 TIMES A DAY PER SLIDING SCALE UP TO 30 UNITS A DAY DX E11.9 3 mL 5   insulin degludec (TRESIBA FLEXTOUCH) 100 UNIT/ML FlexTouch Pen Inject 26 Units into the skin daily. 3 mL 3   Insulin Pen Needle (B-D UF III MINI PEN NEEDLES) 31G X 5 MM MISC CHECK THREE TIMES DAILY DX E11.9     Insulin Pen Needle (B-D UF III MINI PEN NEEDLES) 31G X 5 MM MISC CHECK THREE TIMES DAILY DX E11.9     Insulin Syringe-Needle U-100 31G X 5/16" 0.3 ML MISC CHECK THREE TIMES DAILY DX E11.9     Lancets (ONETOUCH DELICA PLUS LANCET33G) MISC Apply topically.     levocetirizine (XYZAL) 5 MG tablet TAKE 1 TABLET BY MOUTH EVERY DAY IN THE EVENING 90 tablet 3   lisinopril (ZESTRIL) 40 MG tablet TAKE 1 TABLET BY MOUTH EVERY DAY 90 tablet 0   metoCLOPramide (REGLAN) 10 MG tablet Take 1 tablet (10 mg total) by mouth every 6 (six) hours as needed. for nausea 60 tablet 0  metoprolol tartrate (LOPRESSOR) 25 MG tablet TAKE 1 TABLET BY MOUTH TWICE A DAY 180 tablet 1   nitroGLYCERIN (NITROSTAT) 0.4 MG SL tablet Place 1 tablet (0.4 mg total) under the tongue every 5 (five) minutes as  needed for chest pain. 20 tablet 3   omeprazole (PRILOSEC) 40 MG capsule TAKE 1 CAPSULE BY MOUTH EVERY DAY 90 capsule 1   ONETOUCH VERIO test strip 4 (four) times daily.     pravastatin (PRAVACHOL) 40 MG tablet TAKE 1 TABLET BY MOUTH DAILY. PLEASE KEEP UPCOMING APPT IN JANUARY 2023 90 tablet 2   No current facility-administered medications on file prior to visit.    Allergies:   Allergies  Allergen Reactions   Metformin Diarrhea and Other (See Comments)    diarrhea   Robaxin [Methocarbamol] Itching and Rash      OBJECTIVE:  Physical Exam  Vitals:   03/13/23 1519  BP: (!) 171/82  Pulse: 70  Weight: 187 lb 9.6 oz (85.1 kg)  Height: 5\' 6"  (1.676 m)   Body mass index is 30.28 kg/m. No results found.  General: well developed, well nourished, pleasant middle-aged male, seated, in no evident distress Head: head normocephalic and atraumatic.   Neck: supple with no carotid or supraclavicular bruits Cardiovascular: regular rate and rhythm, no murmurs Musculoskeletal: no deformity Skin:  no rash/petichiae Vascular:  Normal pulses all extremities   Neurologic Exam Mental Status: Awake and fully alert. Oriented to place and time. Recent mildly impaired and remote memory intact. Attention span, concentration and fund of knowledge mostly appropriate. Mood and affect appropriate.  Cranial Nerves: Pupils equal, briskly reactive to light. Extraocular movements full without nystagmus. Visual fields full to confrontation. Hearing intact. Facial sensation intact. Face, tongue, palate moves normally and symmetrically.  Motor: Normal bulk and tone. Normal strength in all tested extremity muscles Gait and Station: Arises from chair without difficulty. Stance is normal. Gait demonstrates normal stride length and balance without use of AD. Tandem walk and heel toe without difficulty.           ASSESSMENT/PLAN: Joseph [name] is a 67 y.o. year old male    OSA on CPAP : Has not used  CPAP over the past month but month prior showed adequate usage and optimal residual AHI.  Unclear reason for not using over the past month, he is agreeable to restart. Sleep study showed severe sleep apnea with nocturnal hypoxemia, discussed importance of CPAP compliance compliance especially in patient with extensive cardiac history.  Continue current pressure settings.  Continue to follow with DME company for any needed supplies or CPAP related concerns     Follow up in 6 months or call earlier if needed   CC:  PCP: Georgina Quint, MD    I spent 25 minutes of face-to-face and non-face-to-face time with patient and daughter.  This included previsit chart review, lab review, study review, order entry, electronic health record documentation, patient and daughters education and discussion regarding above diagnoses and treatment plan and answered all other questions to patient and daughters satisfaction  Ihor Austin, [facility]  [facility] [address] [place], [place] [address]  Phone [phone] Fax [phone] Note: This document was prepared with digital dictation and possible smart phrase technology. Any transcriptional errors that result from this process are unintentional.

## 2023-03-13 ENCOUNTER — Ambulatory Visit (INDEPENDENT_AMBULATORY_CARE_PROVIDER_SITE_OTHER): Payer: Medicare HMO | Admitting: Adult Health

## 2023-03-13 ENCOUNTER — Encounter: Payer: Self-pay | Admitting: Adult Health

## 2023-03-13 VITALS — BP 171/82 | HR 70 | Ht 66.0 in | Wt 187.6 lb

## 2023-03-13 DIAGNOSIS — G4733 Obstructive sleep apnea (adult) (pediatric): Secondary | ICD-10-CM

## 2023-03-13 NOTE — Patient Instructions (Signed)
Ensure you are using your CPAP nightly for adequate sleep apnea management   Follow up with your DME company [employer] for any needed supplies or CPAP related concerns     Follow up in 6 months or call earlier if needed

## 2023-03-14 ENCOUNTER — Telehealth: Payer: Self-pay

## 2023-03-16 ENCOUNTER — Encounter (INDEPENDENT_AMBULATORY_CARE_PROVIDER_SITE_OTHER): Payer: Medicare HMO | Admitting: Ophthalmology

## 2023-03-16 DIAGNOSIS — Z794 Long term (current) use of insulin: Secondary | ICD-10-CM | POA: Diagnosis not present

## 2023-03-16 DIAGNOSIS — E113513 Type 2 diabetes mellitus with proliferative diabetic retinopathy with macular edema, bilateral: Secondary | ICD-10-CM | POA: Diagnosis not present

## 2023-03-16 DIAGNOSIS — I10 Essential (primary) hypertension: Secondary | ICD-10-CM

## 2023-03-16 DIAGNOSIS — H35033 Hypertensive retinopathy, bilateral: Secondary | ICD-10-CM

## 2023-03-27 ENCOUNTER — Other Ambulatory Visit: Payer: Self-pay | Admitting: Emergency Medicine

## 2023-03-28 ENCOUNTER — Other Ambulatory Visit: Payer: Self-pay | Admitting: Emergency Medicine

## 2023-03-29 MED ORDER — LISINOPRIL 40 MG PO TABS: 40.0000 mg | ORAL_TABLET | Freq: Every day | ORAL | 0 refills | Status: DC

## 2023-04-03 DIAGNOSIS — E7800 Pure hypercholesterolemia, unspecified: Secondary | ICD-10-CM | POA: Diagnosis not present

## 2023-04-03 DIAGNOSIS — N183 Chronic kidney disease, stage 3 unspecified: Secondary | ICD-10-CM | POA: Diagnosis not present

## 2023-04-03 DIAGNOSIS — E1165 Type 2 diabetes mellitus with hyperglycemia: Secondary | ICD-10-CM | POA: Diagnosis not present

## 2023-04-03 DIAGNOSIS — N182 Chronic kidney disease, stage 2 (mild): Secondary | ICD-10-CM | POA: Diagnosis not present

## 2023-04-03 DIAGNOSIS — I10 Essential (primary) hypertension: Secondary | ICD-10-CM | POA: Diagnosis not present

## 2023-04-17 ENCOUNTER — Ambulatory Visit: Payer: Medicaid Other | Admitting: Emergency Medicine

## 2023-04-21 ENCOUNTER — Other Ambulatory Visit: Payer: Self-pay | Admitting: Internal Medicine

## 2023-04-23 ENCOUNTER — Encounter: Payer: Self-pay | Admitting: Emergency Medicine

## 2023-04-23 ENCOUNTER — Other Ambulatory Visit: Payer: Self-pay | Admitting: Emergency Medicine

## 2023-04-23 DIAGNOSIS — F418 Other specified anxiety disorders: Secondary | ICD-10-CM

## 2023-04-23 MED ORDER — BUSPIRONE HCL 5 MG PO TABS: 5.0000 mg | ORAL_TABLET | Freq: Two times a day (BID) | ORAL | 1 refills | Status: DC | PRN

## 2023-04-23 MED ORDER — BUSPIRONE HCL 5 MG PO TABS: 5.0000 mg | ORAL_TABLET | Freq: Two times a day (BID) | ORAL | 1 refills | Status: AC | PRN

## 2023-04-23 NOTE — Telephone Encounter (Signed)
EGD done in November 2024. Please advise how many refills Sir, thank you.

## 2023-04-23 NOTE — Addendum Note (Signed)
Addended by: Aundra Millet on: 04/23/2023 04:35 PM   Modules accepted: Orders

## 2023-04-23 NOTE — Telephone Encounter (Signed)
New prescription for BuSpar 5 mg to be taken twice a day sent to pharmacy of record.  This is for short-term not long-term treatment of anxiety.  Thanks.

## 2023-04-27 ENCOUNTER — Encounter (INDEPENDENT_AMBULATORY_CARE_PROVIDER_SITE_OTHER): Payer: Medicare HMO | Admitting: Ophthalmology

## 2023-04-30 ENCOUNTER — Encounter: Payer: Self-pay | Admitting: Emergency Medicine

## 2023-05-01 NOTE — Telephone Encounter (Signed)
Recommend to increase dose of BuSpar and please refer to psychiatrist

## 2023-05-04 ENCOUNTER — Encounter (INDEPENDENT_AMBULATORY_CARE_PROVIDER_SITE_OTHER): Payer: Medicare HMO | Admitting: Ophthalmology

## 2023-05-04 DIAGNOSIS — H35033 Hypertensive retinopathy, bilateral: Secondary | ICD-10-CM

## 2023-05-04 DIAGNOSIS — Z794 Long term (current) use of insulin: Secondary | ICD-10-CM | POA: Diagnosis not present

## 2023-05-04 DIAGNOSIS — E113513 Type 2 diabetes mellitus with proliferative diabetic retinopathy with macular edema, bilateral: Secondary | ICD-10-CM | POA: Diagnosis not present

## 2023-05-04 DIAGNOSIS — I10 Essential (primary) hypertension: Secondary | ICD-10-CM

## 2023-05-04 NOTE — Telephone Encounter (Signed)
 Thank you :)

## 2023-05-10 ENCOUNTER — Other Ambulatory Visit: Payer: Self-pay | Admitting: Radiology

## 2023-05-10 DIAGNOSIS — F418 Other specified anxiety disorders: Secondary | ICD-10-CM

## 2023-05-10 DIAGNOSIS — F4321 Adjustment disorder with depressed mood: Secondary | ICD-10-CM

## 2023-05-25 ENCOUNTER — Ambulatory Visit: Payer: Medicare HMO | Admitting: Psychology

## 2023-05-25 DIAGNOSIS — F4321 Adjustment disorder with depressed mood: Secondary | ICD-10-CM | POA: Diagnosis not present

## 2023-05-25 DIAGNOSIS — F418 Other specified anxiety disorders: Secondary | ICD-10-CM

## 2023-05-25 DIAGNOSIS — F4323 Adjustment disorder with mixed anxiety and depressed mood: Secondary | ICD-10-CM

## 2023-05-25 NOTE — Progress Notes (Addendum)
 Comprehensive Clinical Assessment (CCA) Note  05/25/2023 Joseph [name] [id number]  Time Spent: 5:05 pm - 5:50 pm 45 minutes  Chief Complaint: "I need my medication changed, I also want to talk to someone."  Visit Diagnosis: Situational Anxiety 41.8 and Grieving 43.21  Guardian/Payee:  Self    Paperwork requested: No   Reason for Visit /Presenting Problem: Medication management and "having someone to talk to about everything I've had going on".  Mental Status Exam: Appearance:   Well Groomed     Behavior:  Appropriate  Motor:  Normal  Speech/Language:   Negative  Affect:  Depressed  Mood:  depressed  Thought process:  normal  Thought content:    WNL  Sensory/Perceptual disturbances:    WNL  Orientation:  oriented to person, place, and time/date  Attention:  Good  Concentration:  Fair  Memory:  Fluctuates  Fund of knowledge:   Fair  Insight:    Good  Judgment:   Good  Impulse Control:  Good   Reported Symptoms:  Feeling nervous, not being able to control worrying, trouble relaxing, becoming easily annoyed and irritable, little interest in doing things, feeling down, trouble with sleeping, trouble concentrating on things.   Risk Assessment: Danger to Self:  No Self-injurious Behavior: No Danger to Others: No Duty to Warn:no Physical Aggression / Violence:No  Access to Firearms a concern: No  Gang Involvement:No  Patient / guardian was educated about steps to take if suicide or homicide risk level increases between visits: yes While future psychiatric events cannot be accurately predicted, the patient does not currently require acute inpatient psychiatric care and does not currently meet [facility] involuntary commitment criteria.  Substance Abuse History: Current substance abuse: No     Caffeine: cup of coffee Tobacco: [name] Alcohol: [name] Substance use: [name]  Past Psychiatric History:   Previous psychological history is significant for depression Outpatient  Providers:PCP History of Psych Hospitalization: No  Psychological Testing:  [name]    Abuse History:  Victim of: No.,  [name]    Report needed: No. Victim of Neglect:No. Perpetrator of  No   Witness / Exposure to Domestic Violence: No   Protective Services Involvement: No  Witness to [employer] Violence:  No   Family History:  Family History  Problem Relation Age of Onset   Diabetes Father    Hypertension Father    Cancer Sister        unknown   Colon cancer Neg Hx    Esophageal cancer Neg Hx    Rectal cancer Neg Hx    Stomach cancer Neg Hx    Pancreatic cancer Neg Hx    Sleep apnea Neg Hx     Living situation: the patient lives with their daughter  Sexual Orientation: Straight  Relationship Status: widowed  Name of spouse / other: Gwendolyn If a parent, number of children / ages: unsure of their ages,   Support Systems: His daughter   Financial Stress:  Yes   Income/Employment/Disability: No income  Financial planner: No   Educational History: Education:  grade school  Religion/Sprituality/World View: Catholic  Any cultural differences that may affect / interfere with treatment:  not applicable   Recreation/Hobbies: Just getting out of the house, going to the grocery store, walks inside.  Stressors: Loss of brother in December 2023, sister who has diabetes and is sickly.    His "psychiatry medication didn't work."  Strengths: Family  Barriers:  Spanish is his first language, he speaks some English if his  daughter assists.  Legal History: Pending legal issue / charges: The patient has no significant history of legal issues. History of legal issue / charges:  [name]  Medical History/Surgical History: reviewed Past Medical History:  Diagnosis Date   Anemia    Anxiety    Arthritis    BPH (benign prostatic hypertrophy)    Cataract    Chronic renal insufficiency, stage III (moderate) (HCC)    [place] kidney   Coronary artery disease    Dr Rennis Golden     Depression    Diabetes mellitus without complication (HCC) 1988   insulin dependent   Diabetic autonomic neuropathy (HCC) 03/19/2019   Gastric polyps - hyperplastic 03/19/2019   Gastroparesis due to DM (HCC)    GERD (gastroesophageal reflux disease)    Hx of adenomatous colonic polyps 06/11/2014   Hyperlipidemia    Hypertension    Myocardial infarction (HCC)    Neuropathy    PVD (peripheral vascular disease) (HCC)    Sleep apnea 2010   refuses to use cpap    Past Surgical History:  Procedure Laterality Date   ANKLE FRACTURE SURGERY Right    BIOPSY  02/08/2023   Procedure: BIOPSY;  Surgeon: Iva Boop, MD;  Location: [facility];  Service: Gastroenterology;;   CARDIAC CATHETERIZATION  01/30/2012   This demonstrated proximal to mid LAD disease and disease of 2 large OM vessels concerning for surgical disease.   COLONOSCOPY     CORONARY ARTERY BYPASS GRAFT  02/06/2012   Procedure: CORONARY ARTERY BYPASS GRAFTING (CABG); 5- Vessel bypass a LIMA to the LAD, a saphenous vein to the to the diagonal , a saphenous vein to the distal OM, and sequential reverse saphenous vien graft to the posterior descending and 2nd OM. Surgeon: Delight Ovens, MD;  Location: [facility] OR;  Service: Open Heart Surgery;  Laterality: [name];   ESOPHAGOGASTRODUODENOSCOPY  2005   ESOPHAGOGASTRODUODENOSCOPY [name] 02/08/2023   Procedure: ESOPHAGOGASTRODUODENOSCOPY (EGD);  Surgeon: Iva Boop, MD;  Location: [facility];  Service: Gastroenterology;  Laterality: [name];   GASTRIC VARICES BANDING  02/08/2023   Procedure: GASTRIC POLYPS BANDING;  Surgeon: Iva Boop, MD;  Location: [facility];  Service: Gastroenterology;;   LEFT HEART CATHETERIZATION WITH CORONARY ANGIOGRAM [name] 01/30/2012   Procedure: LEFT HEART CATHETERIZATION WITH CORONARY ANGIOGRAM;  Surgeon: Chrystie Nose, MD;  Location: [facility] CATH LAB;  Service: Cardiovascular;  Laterality: [name];   teeth removal  08/16/2021   TOOTH EXTRACTION [name] 09/16/2021    Procedure: DENTAL RESTORATION/EXTRACTIONS;  Surgeon: Ocie Doyne, DMD;  Location: [facility] OR;  Service: Oral Surgery;  Laterality: [name];   UPPER GASTROINTESTINAL ENDOSCOPY      Medications: Current Outpatient Medications  Medication Sig Dispense Refill   Ascorbic Acid (VITAMIN C PO) Take by mouth.     aspirin EC 81 MG tablet Take 81 mg by mouth daily.     B-D UF III MINI PEN NEEDLES 31G X 5 MM MISC Inject into the skin 3 (three) times daily.     B-D UF III MINI PEN NEEDLES 31G X 5 MM MISC CHECK THREE TIMES DAILY DX E11.9 300 each 1   budesonide-formoterol (SYMBICORT) 160-4.5 MCG/ACT inhaler Inhale 2 puffs into the lungs 2 (two) times daily as needed (asthma).     busPIRone (BUSPAR) 5 MG tablet Take 1 tablet (5 mg total) by mouth 2 (two) times daily as needed. 30 tablet 1   Cholecalciferol (VITAMIN D3) 25 MCG (1000 UT) CAPS Take 1 capsule (1,000 Units total) by mouth daily.  Follow-up appt due w/ Dr. Alvy Bimler must see provider for future refills 90 capsule 0   cyclobenzaprine (FLEXERIL) 10 MG tablet TAKE 1 TABLET BY MOUTH THREE TIMES A DAY AS NEEDED FOR MUSCLE SPASM 90 tablet 0   DULoxetine (CYMBALTA) 60 MG capsule TAKE 1 CAPSULE BY MOUTH EVERY DAY 90 capsule 1   ergocalciferol (VITAMIN D2) 1.25 MG (50000 UT) capsule Take 50,000 Units by mouth every Wednesday.     famotidine (PEPCID) 40 MG tablet TAKE 1 TABLET BY MOUTH EVERYDAY AT BEDTIME 90 tablet 3   FARXIGA 10 MG TABS tablet Take 10 mg by mouth daily.     fenofibrate 160 MG tablet TAKE 1 TABLET BY MOUTH EVERY DAY 90 tablet 3   ferrous sulfate 325 (65 FE) MG tablet Take 325 mg by mouth daily with breakfast.     furosemide (LASIX) 20 MG tablet TAKE 1 TABLET (20 MG TOTAL) BY MOUTH DAILY AS NEEDED FOR EDEMA. 90 tablet 3   glucose blood (ONETOUCH VERIO) test strip CHECK SUGARS 4 TIMES A DAY DX E11.9     insulin aspart (NOVOLOG FLEXPEN) 100 UNIT/ML FlexPen INJECT 3 TIMES A DAY PER SLIDING SCALE UP TO 30 UNITS A DAY DX E11.9 3 mL 5   insulin  degludec (TRESIBA FLEXTOUCH) 100 UNIT/ML FlexTouch Pen Inject 26 Units into the skin daily. 3 mL 3   Insulin Pen Needle (B-D UF III MINI PEN NEEDLES) 31G X 5 MM MISC CHECK THREE TIMES DAILY DX E11.9     Insulin Pen Needle (B-D UF III MINI PEN NEEDLES) 31G X 5 MM MISC CHECK THREE TIMES DAILY DX E11.9     Insulin Syringe-Needle U-100 31G X 5/16" 0.3 ML MISC CHECK THREE TIMES DAILY DX E11.9     Lancets (ONETOUCH DELICA PLUS LANCET33G) MISC Apply topically.     levocetirizine (XYZAL) 5 MG tablet TAKE 1 TABLET BY MOUTH EVERY DAY IN THE EVENING 90 tablet 3   lisinopril (ZESTRIL) 40 MG tablet Take 1 tablet (40 mg total) by mouth daily. 90 tablet 0   metoCLOPramide (REGLAN) 10 MG tablet Take 1 tablet (10 mg total) by mouth every 6 (six) hours as needed. for nausea 60 tablet 0   metoprolol tartrate (LOPRESSOR) 25 MG tablet TAKE 1 TABLET BY MOUTH TWICE A DAY 180 tablet 1   nitroGLYCERIN (NITROSTAT) 0.4 MG SL tablet Place 1 tablet (0.4 mg total) under the tongue every 5 (five) minutes as needed for chest pain. 20 tablet 3   omeprazole (PRILOSEC) 40 MG capsule TAKE 1 CAPSULE BY MOUTH EVERY DAY 90 capsule 1   ONETOUCH VERIO test strip 4 (four) times daily.     pravastatin (PRAVACHOL) 40 MG tablet TAKE 1 TABLET BY MOUTH DAILY. PLEASE KEEP UPCOMING APPT IN JANUARY 2023 90 tablet 2   No current facility-administered medications for this visit.    Allergies  Allergen Reactions   Metformin Diarrhea and Other (See Comments)    diarrhea   Robaxin [Methocarbamol] Itching and Rash    Diagnoses:  Situational Anxiety 41.8 and Grieving 43.21  Psychiatric Treatment: Yes , via PCP  Plan of Care: OPT  Narrative:   Robbie Louis participated from home, via video, is aware of tele-sessions limitations, and consented to treatment. Therapist participated from office. We reviewed the limits of confidentiality prior to the start of the evaluation. Robbie Louis expressed understanding and agreement to proceed.    Patient's primary language is Spanish, but he spoke enough English so we were able to  communicate. Patient's daughter set up the virtual session on the home computer, and other than leaving the area for a short while, she stayed within hearing distance to assist with translation, dates, and names. Patient and patient's daughter were under the impression that patient would be seeing a psychiatrist for medication management "because the medicine I'm taking isn't helping me". This Clinical research associate explained the difference between a psychiatrist and a psychologist, and this Clinical research associate will refer patient to a psychiatrist thought [facility] main office. Both patient and patient's daughter were interested in the patient continuing to talk with this Clinical research associate, after patient has an appt with a psychiatrist for medication management. Patient reported that ever since his brother died in 2023-12-25he has been feeling sad. Patient reported that he is always worrying about his deceased brother, and one of his sisters who has diabetes, and he also worries about his kids in general. When patient tries to relax he can't stop shaking his legs. Before his brother died, patient had been interested in going to the flea market, and going to the movies, but not any more. Patient goes to bed around 1:00 am, gets up around 7 am. He sleeps a lot during the day. Patient has trouble concentrating on things, like the television or reading something on his phone.    A follow-up appt with this writer was not scheduled during today's session, because patient's priority is medication management. Patient's daughter has this writer's contact information, and she will schedule a follow up appt with the [facility] main office, as soon as she hears from a psychiatrist. Therapist answered all questions during the evaluation and contact information was provided.    Helyn Numbers

## 2023-06-05 ENCOUNTER — Encounter: Payer: Self-pay | Admitting: Emergency Medicine

## 2023-06-05 ENCOUNTER — Other Ambulatory Visit: Payer: Self-pay | Admitting: Radiology

## 2023-06-05 MED ORDER — OMEPRAZOLE 40 MG PO CPDR: 40.0000 mg | DELAYED_RELEASE_CAPSULE | Freq: Every day | ORAL | 1 refills | Status: DC

## 2023-06-08 ENCOUNTER — Encounter (INDEPENDENT_AMBULATORY_CARE_PROVIDER_SITE_OTHER): Payer: Medicare HMO | Admitting: Ophthalmology

## 2023-06-08 DIAGNOSIS — E113513 Type 2 diabetes mellitus with proliferative diabetic retinopathy with macular edema, bilateral: Secondary | ICD-10-CM | POA: Diagnosis not present

## 2023-06-08 DIAGNOSIS — I10 Essential (primary) hypertension: Secondary | ICD-10-CM

## 2023-06-08 DIAGNOSIS — H35033 Hypertensive retinopathy, bilateral: Secondary | ICD-10-CM | POA: Diagnosis not present

## 2023-06-08 DIAGNOSIS — Z794 Long term (current) use of insulin: Secondary | ICD-10-CM | POA: Diagnosis not present

## 2023-06-19 ENCOUNTER — Ambulatory Visit (HOSPITAL_COMMUNITY): Admitting: Psychiatry

## 2023-06-23 DIAGNOSIS — E1165 Type 2 diabetes mellitus with hyperglycemia: Secondary | ICD-10-CM | POA: Diagnosis not present

## 2023-06-25 ENCOUNTER — Ambulatory Visit: Admitting: Emergency Medicine

## 2023-06-30 ENCOUNTER — Other Ambulatory Visit: Payer: Self-pay | Admitting: Emergency Medicine

## 2023-07-02 DIAGNOSIS — E1165 Type 2 diabetes mellitus with hyperglycemia: Secondary | ICD-10-CM | POA: Diagnosis not present

## 2023-07-04 ENCOUNTER — Other Ambulatory Visit: Payer: Self-pay | Admitting: Emergency Medicine

## 2023-07-04 ENCOUNTER — Encounter: Payer: Self-pay | Admitting: Emergency Medicine

## 2023-07-04 NOTE — Telephone Encounter (Unsigned)
 Copied from CRM [id number]. Topic: Clinical - Prescription Issue >> Jul 04, 2023  5:02 PM Denese Killings wrote: Reason for CRM: Patient daughter April states lisinopril (ZESTRIL) 40 MG tablet was sent o the wrong pharmacy. She states that ti should go to [facility] in [place]. She said that all of his prescriptions going forward should be sent to [facility] in [place]. *Patient is out of medication for 3 days.

## 2023-07-05 ENCOUNTER — Other Ambulatory Visit: Payer: Self-pay | Admitting: Radiology

## 2023-07-05 MED ORDER — LISINOPRIL 40 MG PO TABS: 40.0000 mg | ORAL_TABLET | Freq: Every day | ORAL | 0 refills | Status: DC

## 2023-07-09 DIAGNOSIS — N183 Chronic kidney disease, stage 3 unspecified: Secondary | ICD-10-CM | POA: Diagnosis not present

## 2023-07-09 DIAGNOSIS — E10319 Type 1 diabetes mellitus with unspecified diabetic retinopathy without macular edema: Secondary | ICD-10-CM | POA: Diagnosis not present

## 2023-07-09 DIAGNOSIS — E1165 Type 2 diabetes mellitus with hyperglycemia: Secondary | ICD-10-CM | POA: Diagnosis not present

## 2023-07-09 DIAGNOSIS — E7800 Pure hypercholesterolemia, unspecified: Secondary | ICD-10-CM | POA: Diagnosis not present

## 2023-07-09 DIAGNOSIS — I10 Essential (primary) hypertension: Secondary | ICD-10-CM | POA: Diagnosis not present

## 2023-07-20 ENCOUNTER — Encounter (INDEPENDENT_AMBULATORY_CARE_PROVIDER_SITE_OTHER): Admitting: Ophthalmology

## 2023-07-22 ENCOUNTER — Other Ambulatory Visit: Payer: Self-pay | Admitting: Emergency Medicine

## 2023-07-22 DIAGNOSIS — F330 Major depressive disorder, recurrent, mild: Secondary | ICD-10-CM

## 2023-07-24 DIAGNOSIS — N183 Chronic kidney disease, stage 3 unspecified: Secondary | ICD-10-CM | POA: Diagnosis not present

## 2023-07-24 DIAGNOSIS — I10 Essential (primary) hypertension: Secondary | ICD-10-CM | POA: Diagnosis not present

## 2023-07-24 DIAGNOSIS — E1165 Type 2 diabetes mellitus with hyperglycemia: Secondary | ICD-10-CM | POA: Diagnosis not present

## 2023-07-27 ENCOUNTER — Encounter (INDEPENDENT_AMBULATORY_CARE_PROVIDER_SITE_OTHER): Admitting: Ophthalmology

## 2023-07-27 DIAGNOSIS — Z794 Long term (current) use of insulin: Secondary | ICD-10-CM | POA: Diagnosis not present

## 2023-07-27 DIAGNOSIS — I10 Essential (primary) hypertension: Secondary | ICD-10-CM

## 2023-07-27 DIAGNOSIS — E113513 Type 2 diabetes mellitus with proliferative diabetic retinopathy with macular edema, bilateral: Secondary | ICD-10-CM | POA: Diagnosis not present

## 2023-07-27 DIAGNOSIS — H35033 Hypertensive retinopathy, bilateral: Secondary | ICD-10-CM | POA: Diagnosis not present

## 2023-07-30 ENCOUNTER — Encounter: Payer: Self-pay | Admitting: Emergency Medicine

## 2023-07-30 ENCOUNTER — Ambulatory Visit (INDEPENDENT_AMBULATORY_CARE_PROVIDER_SITE_OTHER): Admitting: Emergency Medicine

## 2023-07-30 ENCOUNTER — Encounter: Payer: Self-pay | Admitting: Internal Medicine

## 2023-07-30 VITALS — BP 190/91 | HR 58 | Temp 98.7°F | Ht 66.0 in | Wt 194.0 lb

## 2023-07-30 DIAGNOSIS — E1169 Type 2 diabetes mellitus with other specified complication: Secondary | ICD-10-CM | POA: Diagnosis not present

## 2023-07-30 DIAGNOSIS — E1143 Type 2 diabetes mellitus with diabetic autonomic (poly)neuropathy: Secondary | ICD-10-CM | POA: Diagnosis not present

## 2023-07-30 DIAGNOSIS — I152 Hypertension secondary to endocrine disorders: Secondary | ICD-10-CM | POA: Diagnosis not present

## 2023-07-30 DIAGNOSIS — K3184 Gastroparesis: Secondary | ICD-10-CM

## 2023-07-30 DIAGNOSIS — K2100 Gastro-esophageal reflux disease with esophagitis, without bleeding: Secondary | ICD-10-CM

## 2023-07-30 DIAGNOSIS — E785 Hyperlipidemia, unspecified: Secondary | ICD-10-CM

## 2023-07-30 DIAGNOSIS — N182 Chronic kidney disease, stage 2 (mild): Secondary | ICD-10-CM

## 2023-07-30 DIAGNOSIS — F4323 Adjustment disorder with mixed anxiety and depressed mood: Secondary | ICD-10-CM

## 2023-07-30 DIAGNOSIS — N1831 Chronic kidney disease, stage 3a: Secondary | ICD-10-CM | POA: Diagnosis not present

## 2023-07-30 DIAGNOSIS — E1159 Type 2 diabetes mellitus with other circulatory complications: Secondary | ICD-10-CM | POA: Diagnosis not present

## 2023-07-30 DIAGNOSIS — E1142 Type 2 diabetes mellitus with diabetic polyneuropathy: Secondary | ICD-10-CM | POA: Diagnosis not present

## 2023-07-30 DIAGNOSIS — I739 Peripheral vascular disease, unspecified: Secondary | ICD-10-CM

## 2023-07-30 LAB — COMPREHENSIVE METABOLIC PANEL WITH GFR
ALT: 17 U/L (ref 0–53)
AST: 21 U/L (ref 0–37)
Albumin: 4.4 g/dL (ref 3.5–5.2)
Alkaline Phosphatase: 44 U/L (ref 39–117)
BUN: 24 mg/dL — ABNORMAL HIGH (ref 6–23)
CO2: 29 meq/L (ref 19–32)
Calcium: 9.5 mg/dL (ref 8.4–10.5)
Chloride: 105 meq/L (ref 96–112)
Creatinine, Ser: 1.55 mg/dL — ABNORMAL HIGH (ref 0.40–1.50)
GFR: 45.76 mL/min — ABNORMAL LOW (ref >60.00)
Glucose, Bld: 94 mg/dL (ref 70–99)
Potassium: 4.5 meq/L (ref 3.5–5.1)
Sodium: 142 meq/L (ref 135–145)
Total Bilirubin: 0.4 mg/dL (ref 0.2–1.2)
Total Protein: 7.4 g/dL (ref 6.0–8.3)

## 2023-07-30 LAB — POCT GLYCOSYLATED HEMOGLOBIN (HGB A1C): Hemoglobin A1C: 6.7 % — AB (ref 4.0–5.6)

## 2023-07-30 LAB — CBC WITH DIFFERENTIAL/PLATELET
Basophils Absolute: 0 10*3/uL (ref 0.0–0.1)
Basophils Relative: 0.5 % (ref 0.0–3.0)
Eosinophils Absolute: 0.1 10*3/uL (ref 0.0–0.7)
Eosinophils Relative: 2.2 % (ref 0.0–5.0)
HCT: 42.6 % (ref 39.0–52.0)
Hemoglobin: 14 g/dL (ref 13.0–17.0)
Lymphocytes Relative: 32.7 % (ref 12.0–46.0)
Lymphs Abs: 1.8 10*3/uL (ref 0.7–4.0)
MCHC: 32.8 g/dL (ref 30.0–36.0)
MCV: 87.3 fl (ref 78.0–100.0)
Monocytes Absolute: 0.5 10*3/uL (ref 0.1–1.0)
Monocytes Relative: 10.1 % (ref 3.0–12.0)
Neutro Abs: 2.9 10*3/uL (ref 1.4–7.7)
Neutrophils Relative %: 54.5 % (ref 43.0–77.0)
Platelets: 263 10*3/uL (ref 150.0–400.0)
RBC: 4.88 Mil/uL (ref 4.22–5.81)
RDW: 14.3 % (ref 11.5–15.5)
WBC: 5.4 10*3/uL (ref 4.0–10.5)

## 2023-07-30 LAB — LIPID PANEL
Cholesterol: 113 mg/dL (ref 0–200)
HDL: 38.3 mg/dL — ABNORMAL LOW (ref >39.00)
LDL Cholesterol: 57 mg/dL (ref 0–99)
NonHDL: 74.78
Total CHOL/HDL Ratio: 3
Triglycerides: 88 mg/dL (ref 0.0–149.0)
VLDL: 17.6 mg/dL (ref 0.0–40.0)

## 2023-07-30 LAB — MICROALBUMIN / CREATININE URINE RATIO
Creatinine,U: 55.1 mg/dL
Microalb Creat Ratio: 972.2 mg/g — ABNORMAL HIGH (ref 0.0–30.0)
Microalb, Ur: 53.5 mg/dL — ABNORMAL HIGH (ref 0.0–1.9)

## 2023-07-30 MED ORDER — AMLODIPINE BESYLATE 5 MG PO TABS: 5.0000 mg | ORAL_TABLET | Freq: Every day | ORAL | 3 refills | Status: AC

## 2023-07-30 MED ORDER — VALSARTAN-HYDROCHLOROTHIAZIDE 160-12.5 MG PO TABS: 1.0000 | ORAL_TABLET | Freq: Every day | ORAL | 3 refills | Status: AC

## 2023-07-30 NOTE — Assessment & Plan Note (Signed)
Much improved.  Continue Cymbalta 60 mg daily.

## 2023-07-30 NOTE — Patient Instructions (Signed)
 Increase amlodipine  to 5 mg daily Stop lisinopril  and start valsartan HCT 160-12.5 mg daily Monitor blood pressure readings at home daily for the next several weeks and keep a log.  Contact the office if numbers persistently abnormal. Follow-up in 3 months  Hypertension, Adult High blood pressure (hypertension) is when the force of blood pumping through the arteries is too strong. The arteries are the blood vessels that carry blood from the heart throughout the body. Hypertension forces the heart to work harder to pump blood and may cause arteries to become narrow or stiff. Untreated or uncontrolled hypertension can lead to a heart attack, heart failure, a stroke, kidney disease, and other problems. A blood pressure reading consists of a higher number over a lower number. Ideally, your blood pressure should be below 120/80. The first ("top") number is called the systolic pressure. It is a measure of the pressure in your arteries as your heart beats. The second ("bottom") number is called the diastolic pressure. It is a measure of the pressure in your arteries as the heart relaxes. What are the causes? The exact cause of this condition is not known. There are some conditions that result in high blood pressure. What increases the risk? Certain factors may make you more likely to develop high blood pressure. Some of these risk factors are under your control, including: Smoking. Not getting enough exercise or physical activity. Being overweight. Having too much fat, sugar, calories, or salt (sodium) in your diet. Drinking too much alcohol. Other risk factors include: Having a personal history of heart disease, diabetes, high cholesterol, or kidney disease. Stress. Having a family history of high blood pressure and high cholesterol. Having obstructive sleep apnea. Age. The risk increases with age. What are the signs or symptoms? High blood pressure may not cause symptoms. Very high blood pressure  (hypertensive crisis) may cause: Headache. Fast or irregular heartbeats (palpitations). Shortness of breath. Nosebleed. Nausea and vomiting. Vision changes. Severe chest pain, dizziness, and seizures. How is this diagnosed? This condition is diagnosed by measuring your blood pressure while you are seated, with your arm resting on a flat surface, your legs uncrossed, and your feet flat on the floor. The cuff of the blood pressure monitor will be placed directly against the skin of your upper arm at the level of your heart. Blood pressure should be measured at least twice using the same arm. Certain conditions can cause a difference in blood pressure between your right and left arms. If you have a high blood pressure reading during one visit or you have normal blood pressure with other risk factors, you may be asked to: Return on a different day to have your blood pressure checked again. Monitor your blood pressure at home for 1 week or longer. If you are diagnosed with hypertension, you may have other blood or imaging tests to help your health care provider understand your overall risk for other conditions. How is this treated? This condition is treated by making healthy lifestyle changes, such as eating healthy foods, exercising more, and reducing your alcohol intake. You may be referred for counseling on a healthy diet and physical activity. Your health care provider may prescribe medicine if lifestyle changes are not enough to get your blood pressure under control and if: Your systolic blood pressure is above 130. Your diastolic blood pressure is above 80. Your personal target blood pressure may vary depending on your medical conditions, your age, and other factors. Follow these instructions at home: Eating and  drinking  Eat a diet that is high in fiber and potassium, and low in sodium, added sugar, and fat. An example of this eating plan is called the DASH diet. DASH stands for Dietary  Approaches to Stop Hypertension. To eat this way: Eat plenty of fresh fruits and vegetables. Try to fill one half of your plate at each meal with fruits and vegetables. Eat whole grains, such as whole-wheat pasta, brown rice, or whole-grain bread. Fill about one fourth of your plate with whole grains. Eat or drink low-fat dairy products, such as skim milk or low-fat yogurt. Avoid fatty cuts of meat, processed or cured meats, and poultry with skin. Fill about one fourth of your plate with lean proteins, such as fish, chicken without skin, beans, eggs, or tofu. Avoid pre-made and processed foods. These tend to be higher in sodium, added sugar, and fat. Reduce your daily sodium intake. Many people with hypertension should eat less than 1,500 mg of sodium a day. Do not drink alcohol if: Your health care provider tells you not to drink. You are pregnant, may be pregnant, or are planning to become pregnant. If you drink alcohol: Limit how much you have to: 0-1 drink a day for women. 0-2 drinks a day for men. Know how much alcohol is in your drink. In the [place] one drink equals one 12 oz bottle of beer (355 mL), one 5 oz glass of wine (148 mL), or one 1 oz glass of hard liquor (44 mL). Lifestyle  Work with your health care provider to maintain a healthy body weight or to lose weight. Ask what an ideal weight is for you. Get at least 30 minutes of exercise that causes your heart to beat faster (aerobic exercise) most days of the week. Activities may include walking, swimming, or biking. Include exercise to strengthen your muscles (resistance exercise), such as Pilates or lifting weights, as part of your weekly exercise routine. Try to do these types of exercises for 30 minutes at least 3 days a week. Do not use any products that contain nicotine or tobacco. These products include cigarettes, chewing tobacco, and vaping devices, such as e-cigarettes. If you need help quitting, ask your health care  provider. Monitor your blood pressure at home as told by your health care provider. Keep all follow-up visits. This is important. Medicines Take over-the-counter and prescription medicines only as told by your health care provider. Follow directions carefully. Blood pressure medicines must be taken as prescribed. Do not skip doses of blood pressure medicine. Doing this puts you at risk for problems and can make the medicine less effective. Ask your health care provider about side effects or reactions to medicines that you should watch for. Contact a health care provider if you: Think you are having a reaction to a medicine you are taking. Have headaches that keep coming back (recurring). Feel dizzy. Have swelling in your ankles. Have trouble with your vision. Get help right away if you: Develop a severe headache or confusion. Have unusual weakness or numbness. Feel faint. Have severe pain in your chest or abdomen. Vomit repeatedly. Have trouble breathing. These symptoms may be an emergency. Get help right away. Call 911. Do not wait to see if the symptoms will go away. Do not drive yourself to the hospital. Summary Hypertension is when the force of blood pumping through your arteries is too strong. If this condition is not controlled, it may put you at risk for serious complications. Your personal target blood  pressure may vary depending on your medical conditions, your age, and other factors. For most people, a normal blood pressure is less than 120/80. Hypertension is treated with lifestyle changes, medicines, or a combination of both. Lifestyle changes include losing weight, eating a healthy, low-sodium diet, exercising more, and limiting alcohol. This information is not intended to replace advice given to you by your health care provider. Make sure you discuss any questions you have with your health care provider. Document Revised: 01/18/2021 Document Reviewed: 01/18/2021 Elsevier  Patient Education  2024 [employer].

## 2023-07-30 NOTE — Assessment & Plan Note (Signed)
Stable Continue Cymbalta 60mg daily

## 2023-07-30 NOTE — Assessment & Plan Note (Signed)
Advised to stay well-hydrated and avoid NSAIDs. Continue Farxiga 10 mg daily 

## 2023-07-30 NOTE — Assessment & Plan Note (Signed)
 Stable chronic conditions Continues pravastatin  40 mg daily and fenofibrate  160 mg daily

## 2023-07-30 NOTE — Assessment & Plan Note (Signed)
 BP Readings from Last 3 Encounters:  07/30/23 (!) 190/91  03/13/23 (!) 171/82  02/08/23 (!) 198/69  Uncontrolled hypertension. Cardiovascular risks associated with uncontrolled hypertension discussed Need to change medication doses. To increase amlodipine  to 5 mg daily Recommend to change lisinopril  to valsartan HCT 160-12.5 mg daily Well-controlled diabetes with hemoglobin A1c of 6.7 Continue Farxiga 10 mg daily, Tresiba  insulin  26 units daily and Premeal insulin  aspart as per sliding scale Diet and nutrition discussed Lab work today Follow-up in 6 months

## 2023-07-30 NOTE — Assessment & Plan Note (Signed)
Stable chronic condition.  No complications

## 2023-07-30 NOTE — Assessment & Plan Note (Signed)
Stable.  No longer taking Reglan

## 2023-07-30 NOTE — Progress Notes (Signed)
 Joseph [name] 68 y.o.   Chief Complaint  Patient presents with   Follow-up    6 month f/u for HTN/ DM. Mentions he is getting eye injections every 3weeks. States he B/p last Friday at his dr appt was 200 something.   No other concerns    HISTORY OF PRESENT ILLNESS: This is a 68 y.o. male Here for 82-month follow-up of chronic medical conditions including potential diabetes Overall doing well.  Has no complaints or medical concerns today.  HPI   Prior to Admission medications   Medication Sig Start Date End Date Taking? Authorizing Provider  amLODipine  (NORVASC ) 2.5 MG tablet Take 2.5 mg by mouth daily. 07/09/23  Yes [provider]  busPIRone  (BUSPAR ) 5 MG tablet Take 1 tablet (5 mg total) by mouth 2 (two) times daily as needed. 04/23/23  Yes Erikson Danzy, Isidro Margo, MD  Cholecalciferol  (VITAMIN D3) 25 MCG (1000 UT) CAPS Take 1 capsule (1,000 Units total) by mouth daily. Follow-up appt due w/ Dr. Vedia Geralds must see provider for future refills 07/27/22  Yes Andriea Hasegawa, Isidro Margo, MD  cyclobenzaprine  (FLEXERIL ) 10 MG tablet TAKE 1 TABLET BY MOUTH THREE TIMES A DAY AS NEEDED FOR MUSCLE SPASM 04/24/23  Yes Kenney Peacemaker, MD  DULoxetine  (CYMBALTA ) 60 MG capsule Take 1 capsule by mouth once daily 07/22/23  Yes Ryn Peine [name], MD  ergocalciferol (VITAMIN D2) 1.25 MG (50000 UT) capsule Take 50,000 Units by mouth every Wednesday. 04/09/20  Yes [provider]  famotidine  (PEPCID ) 40 MG tablet TAKE 1 TABLET BY MOUTH EVERYDAY AT BEDTIME 11/29/22  Yes Gauri Galvao, Evening Shade, MD  FARXIGA 10 MG TABS tablet Take 10 mg by mouth daily. 12/14/20  Yes [provider]  fenofibrate  160 MG tablet TAKE 1 TABLET BY MOUTH EVERY DAY 01/16/23  Yes Arieana Somoza, Isidro Margo, MD  ferrous sulfate  325 (65 FE) MG tablet Take 325 mg by mouth daily with breakfast.   Yes [provider]  furosemide  (LASIX ) 20 MG tablet TAKE 1 TABLET (20 MG TOTAL) BY MOUTH DAILY AS NEEDED FOR EDEMA. 11/06/22   Yes [name], Aviva Lemmings, MD  glucose blood (ONETOUCH VERIO) test strip CHECK SUGARS 4 TIMES A DAY DX E11.9 11/26/20  Yes [provider]  insulin  aspart (NOVOLOG  FLEXPEN) 100 UNIT/ML FlexPen INJECT 3 TIMES A DAY PER SLIDING SCALE UP TO 30 UNITS A DAY DX E11.9 05/09/22  Yes Sandy Blouch, Isidro Margo, MD  insulin  degludec (TRESIBA  FLEXTOUCH) 100 UNIT/ML FlexTouch Pen Inject 26 Units into the skin daily. 10/09/22  Yes Dennie Vecchio, Isidro Margo, MD  Insulin  Pen Needle (B-D UF III MINI PEN NEEDLES) 31G X 5 MM MISC CHECK THREE TIMES DAILY DX E11.9 05/10/20  Yes [provider]  Insulin  Pen Needle (B-D UF III MINI PEN NEEDLES) 31G X 5 MM MISC CHECK THREE TIMES DAILY DX E11.9 05/10/20  Yes [provider]  Insulin  Syringe-Needle U-100 31G X 5/16" 0.3 ML MISC CHECK THREE TIMES DAILY DX E11.9 05/10/20  Yes [provider]  Lancets (ONETOUCH DELICA PLUS LANCET33G) MISC Apply topically. 12/10/20  Yes [provider]  levocetirizine (XYZAL ) 5 MG tablet TAKE 1 TABLET BY MOUTH EVERY DAY IN THE EVENING 01/12/23  Yes Vinayak Bobier, Isidro Margo, MD  lisinopril  (ZESTRIL ) 40 MG tablet Take 1 tablet (40 mg total) by mouth daily. 07/05/23  Yes Nikkia Devoss [name], MD  metoprolol  tartrate (LOPRESSOR ) 25 MG tablet Take 1 tablet by mouth twice daily 07/04/23  Yes  Christiana, Isidro Margo, MD  nitroGLYCERIN  (NITROSTAT ) 0.4 MG SL  tablet Place 1 tablet (0.4 mg total) under the tongue every 5 (five) minutes as needed for chest pain. 08/10/20  Yes [name], Aviva Lemmings, MD  omeprazole  (PRILOSEC) 40 MG capsule Take 1 capsule (40 mg total) by mouth daily. 06/05/23  Yes Rosaura Bolon, Isidro Margo, MD  [facility] VERIO test strip 4 (four) times daily. 11/26/20  Yes [provider]  pravastatin  (PRAVACHOL ) 40 MG tablet TAKE 1 TABLET BY MOUTH DAILY. PLEASE KEEP UPCOMING APPT IN JANUARY 2023 12/01/22  Yes [name], Aviva Lemmings, MD  Ascorbic Acid (VITAMIN C PO) Take by mouth.    [provider]  aspirin  EC 81 MG tablet Take  81 mg by mouth daily.    [provider]  B-D UF III MINI PEN NEEDLES 31G X 5 MM MISC Inject into the skin 3 (three) times daily. 12/06/20   [provider]  B-D UF III MINI PEN NEEDLES 31G X 5 MM MISC CHECK THREE TIMES DAILY DX E11.9 12/22/21   Hira Trent [name], MD  budesonide-formoterol [facility]) 160-4.5 MCG/ACT inhaler Inhale 2 puffs into the lungs 2 (two) times daily as needed (asthma). Patient not taking: Reported on 07/30/2023    [provider]  metoCLOPramide  (REGLAN ) 10 MG tablet Take 1 tablet (10 mg total) by mouth every 6 (six) hours as needed. for nausea Patient not taking: Reported on 07/30/2023 04/12/22   Elvira Hammersmith, MD    Allergies  Allergen Reactions   Metformin Diarrhea and Other (See Comments)    diarrhea   Robaxin [Methocarbamol] Itching and Rash    Patient Active Problem List   Diagnosis Date Noted   Stage 3a chronic kidney disease (HCC) 08/16/2022   Sleep apnea 04/20/2022   Situational mixed anxiety and depressive disorder 03/14/2022   Dyslipidemia associated with type 2 diabetes mellitus (HCC) 08/03/2021   Depression 02/01/2021   Gastro-esophageal reflux disease with esophagitis 02/01/2021   Gastric polyps - hyperplastic 03/19/2019   Diabetic autonomic neuropathy (HCC) 03/19/2019   Benign prostatic hyperplasia 01/16/2019   Chronic low back pain 01/16/2019   Iron deficiency anemia 01/16/2019   Dyslipidemia 03/10/2016   Atherosclerotic heart disease of native coronary artery without angina pectoris 05/03/2015   Diabetic peripheral neuropathy associated with type 2 diabetes mellitus (HCC) 05/03/2015   Enlarged prostate without lower urinary tract symptoms (luts) 04/30/2015   Major depressive disorder, recurrent, mild (HCC) 04/30/2015   Peripheral vascular disease (HCC) 04/30/2015   Hx of adenomatous colonic polyps 06/11/2014   Hyperlipidemia 02/25/2014   Diabetic gastroparesis (HCC) 02/17/2014   Neuropathy 12/03/2013    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   Presence of aortocoronary bypass graft 02/11/2012   Coronary arteriography abnormal 02/06/2012   Chronic kidney disease, stage 2 (mild) 01/29/2012   Type 1 diabetes mellitus with complication (HCC) 07/27/2007   Hypertension associated with diabetes (HCC) 07/27/2007   GERD 07/27/2007   ARTHRITIS 07/27/2007   NEPHROLITHIASIS, HX OF 07/27/2007    Past Medical History:  Diagnosis Date   Anemia    Anxiety    Arthritis    BPH (benign prostatic hypertrophy)    Cataract    Chronic renal insufficiency, stage III (moderate) (HCC)    [place] [facility]   Coronary artery disease    Dr Maximo Spar    Depression    Diabetes mellitus without complication (HCC) 1988   insulin  dependent   Diabetic autonomic neuropathy (HCC) 03/19/2019   Gastric polyps - hyperplastic 03/19/2019   Gastroparesis due to DM (HCC)    GERD (gastroesophageal reflux disease)  Hx of adenomatous colonic polyps 06/11/2014   Hyperlipidemia    Hypertension    Myocardial infarction [facility])    Neuropathy    PVD (peripheral vascular disease) (HCC)    Sleep apnea 2010   refuses to use cpap    Past Surgical History:  Procedure Laterality Date   ANKLE FRACTURE SURGERY Right    BIOPSY  02/08/2023   Procedure: BIOPSY;  Surgeon: Kenney Peacemaker, MD;  Location: [facility];  Service: Gastroenterology;;   CARDIAC CATHETERIZATION  01/30/2012   This demonstrated proximal to mid LAD disease and disease of 2 large OM vessels concerning for surgical disease.   COLONOSCOPY     CORONARY ARTERY BYPASS GRAFT  02/06/2012   Procedure: CORONARY ARTERY BYPASS GRAFTING (CABG); 5- Vessel bypass a LIMA to the LAD, a saphenous vein to the to the diagonal , a saphenous vein to the distal OM, and sequential reverse saphenous vien graft to the posterior descending and 2nd OM. Surgeon: Norita Beauvais, MD;  Location: [facility] OR;  Service: Open Heart Surgery;  Laterality: [name];   ESOPHAGOGASTRODUODENOSCOPY  2005    ESOPHAGOGASTRODUODENOSCOPY [name] 02/08/2023   Procedure: ESOPHAGOGASTRODUODENOSCOPY (EGD);  Surgeon: Kenney Peacemaker, MD;  Location: [facility];  Service: Gastroenterology;  Laterality: [name];   GASTRIC VARICES BANDING  02/08/2023   Procedure: GASTRIC POLYPS BANDING;  Surgeon: Kenney Peacemaker, MD;  Location: [facility];  Service: Gastroenterology;;   LEFT HEART CATHETERIZATION WITH CORONARY ANGIOGRAM [name] 01/30/2012   Procedure: LEFT HEART CATHETERIZATION WITH CORONARY ANGIOGRAM;  Surgeon: Hazle Lites, MD;  Location: [facility] C[facility] LAB;  Service: Cardiovascular;  Laterality: [name];   teeth removal  08/16/2021   TOOTH EXTRACTION [name] 09/16/2021   Procedure: DENTAL RESTORATION/EXTRACTIONS;  Surgeon: Ascencion Lava, DMD;  Location: [facility] OR;  Service: Oral Surgery;  Laterality: [name];   UPPER GASTROINTESTINAL ENDOSCOPY      Social History   Socioeconomic History   Marital status: Widowed    Spouse name: Not on file   Number of children: 4   Years of education: Not on file   Highest education level: 5th grade  Occupational History   Occupation: disabled  Tobacco Use   Smoking status: Never   Smokeless tobacco: Never  Vaping Use   Vaping status: Never Used  Substance and Sexual Activity   Alcohol use: No   Drug use: No   Sexual activity: Not Currently  Other Topics Concern   Not on file  Social History Narrative   Widowed, 1 son and 4 daughters   Disabled and dual eligible Medicare and Medicaid   One caffeinated beverage daily   Social Drivers of Health   Financial Resource Strain: Low Risk  (07/29/2023)   Overall Financial Resource Strain (CARDIA)    Difficulty of Paying Living Expenses: Not hard at all  Food Insecurity: No Food Insecurity (07/29/2023)   Hunger Vital Sign    Worried About Running Out of Food in the Last Year: Never true    Ran Out of Food in the Last Year: Never true  Transportation Needs: No Transportation Needs (07/29/2023)   PRAPARE - Scientist, research (physical sciences) (Medical): No    Lack of Transportation (Non-Medical): No  Physical Activity: Sufficiently Active (07/29/2023)   Exercise Vital Sign    Days of Exercise per Week: 3 days    Minutes of Exercise per Session: 60 min  Stress: Stress Concern Present (07/29/2023)   [employer] of Occupational Health - Occupational Stress Questionnaire  Feeling of Stress : Very much  Social Connections: Unknown (07/29/2023)   Social Connection and Isolation Panel [NHANES]    Frequency of Communication with Friends and Family: Three times a week    Frequency of Social Gatherings with Friends and Family: Three times a week    Attends Religious Services: Patient declined    Active Member of Clubs or Organizations: No    Attends Banker Meetings: Not on file    Marital Status: Widowed  Intimate Partner Violence: Not At Risk (02/03/2022)   Humiliation, Afraid, Rape, and Kick questionnaire    Fear of Current or Ex-Partner: No    Emotionally Abused: No    Physically Abused: No    Sexually Abused: No    Family History  Problem Relation Age of Onset   Diabetes Father    Hypertension Father    Cancer Sister        unknown   Colon cancer Neg Hx    Esophageal cancer Neg Hx    Rectal cancer Neg Hx    Stomach cancer Neg Hx    Pancreatic cancer Neg Hx    Sleep apnea Neg Hx      Review of Systems  Constitutional: Negative.  Negative for chills and fever.  HENT: Negative.  Negative for congestion and sore throat.   Respiratory: Negative.  Negative for cough and shortness of breath.   Cardiovascular: Negative.  Negative for chest pain and palpitations.  Gastrointestinal:  Negative for abdominal pain, nausea and vomiting.  Genitourinary: Negative.  Negative for dysuria and hematuria.  Skin: Negative.  Negative for rash.  Neurological: Negative.  Negative for dizziness and headaches.  All other systems reviewed and are negative.   Vitals:   07/30/23 1321  BP: (!) 190/91   Pulse: (!) 58  Temp: 98.7 F (37.1 C)  SpO2: 99%    Physical Exam Vitals reviewed.  Constitutional:      Appearance: Normal appearance.  HENT:     Head: Normocephalic.     Mouth/Throat:     Mouth: Mucous membranes are moist.     Pharynx: Oropharynx is clear.  Eyes:     Extraocular Movements: Extraocular movements intact.     Pupils: Pupils are equal, round, and reactive to light.  Cardiovascular:     Rate and Rhythm: Normal rate and regular rhythm.     Pulses: Normal pulses.     Heart sounds: Normal heart sounds.  Pulmonary:     Effort: Pulmonary effort is normal.     Breath sounds: Normal breath sounds.  Abdominal:     Palpations: Abdomen is soft.     Tenderness: There is no abdominal tenderness.  Musculoskeletal:        General: Normal range of motion.     Cervical back: No tenderness.  Lymphadenopathy:     Cervical: No cervical adenopathy.  Skin:    General: Skin is warm and dry.  Neurological:     Mental Status: He is alert and oriented to person, place, and time.  Psychiatric:        Mood and Affect: Mood normal.        Behavior: Behavior normal.     Results for orders placed or performed in visit on 07/30/23 (from the past 24 hours)  POCT HgB A1C     Status: Abnormal   Collection Time: 07/30/23  1:31 PM  Result Value Ref Range   Hemoglobin A1C 6.7 (A) 4.0 - 5.6 %   HbA1c POC (<>  result, manual entry)     HbA1c, POC (prediabetic range)     HbA1c, POC (controlled diabetic range)      ASSESSMENT & PLAN: A total of 46 minutes was spent with the patient and counseling/coordination of care regarding preparing for this visit, review of most recent office visit notes, review of multiple chronic medical conditions and their management, cardiovascular risks associated with uncontrolled hypertension, review of all medications and changes made, review of most recent bloodwork results, review of health maintenance items, education on nutrition, prognosis,  documentation, and need for follow up.   Problem List Items Addressed This Visit       Cardiovascular and Mediastinum   Hypertension associated with diabetes (HCC) - Primary   BP Readings from Last 3 Encounters:  07/30/23 (!) 190/91  03/13/23 (!) 171/82  02/08/23 (!) 198/69  Uncontrolled hypertension. Cardiovascular risks associated with uncontrolled hypertension discussed Need to change medication doses. To increase amlodipine  to 5 mg daily Recommend to change lisinopril  to valsartan HCT 160-12.5 mg daily Well-controlled diabetes with hemoglobin A1c of 6.7 Continue Farxiga 10 mg daily, Tresiba  insulin  26 units daily and Premeal insulin  aspart as per sliding scale Diet and nutrition discussed Lab work today Follow-up in 6 months       Relevant Medications   valsartan-hydrochlorothiazide (DIOVAN-HCT) 160-12.5 MG tablet   amLODipine  (NORVASC ) 5 MG tablet   Other Relevant Orders   Microalbumin / creatinine urine ratio   CBC with Differential/Platelet   Comprehensive metabolic panel with GFR   Lipid panel   Peripheral vascular disease (HCC)   Stable chronic condition.  No complications      Relevant Medications   valsartan-hydrochlorothiazide (DIOVAN-HCT) 160-12.5 MG tablet   amLODipine  (NORVASC ) 5 MG tablet     Digestive   Diabetic gastroparesis (HCC)   Stable.  No longer taking Reglan       Relevant Medications   valsartan-hydrochlorothiazide (DIOVAN-HCT) 160-12.5 MG tablet   Gastro-esophageal reflux disease with esophagitis   Stable.  Continues omeprazole  40 mg daily        Endocrine   Diabetic autonomic neuropathy (HCC)   Relevant Medications   valsartan-hydrochlorothiazide (DIOVAN-HCT) 160-12.5 MG tablet   Diabetic peripheral neuropathy associated with type 2 diabetes mellitus (HCC)   Stable.  Continue Cymbalta  60 mg daily      Relevant Medications   valsartan-hydrochlorothiazide (DIOVAN-HCT) 160-12.5 MG tablet   Dyslipidemia associated with type 2  diabetes mellitus (HCC)   Stable chronic conditions Continues pravastatin  40 mg daily and fenofibrate  160 mg daily      Relevant Medications   valsartan-hydrochlorothiazide (DIOVAN-HCT) 160-12.5 MG tablet   Other Relevant Orders   POCT HgB A1C (Completed)   Microalbumin / creatinine urine ratio   CBC with Differential/Platelet   Comprehensive metabolic panel with GFR   Lipid panel     Genitourinary   Chronic kidney disease, stage 2 (mild)   Advised to stay well-hydrated and avoid NSAIDs. Continue Farxiga 10 mg daily      Stage 3a chronic kidney disease (HCC)     Other   Situational mixed anxiety and depressive disorder   Much improved.  Continue Cymbalta  60 mg daily      Patient Instructions  Increase amlodipine  to 5 mg daily Stop lisinopril  and start valsartan HCT 160-12.5 mg daily Monitor blood pressure readings at home daily for the next several weeks and keep a log.  Contact the office if numbers persistently abnormal. Follow-up in 3 months  Hypertension, Adult High blood pressure (hypertension) is when  the force of blood pumping through the arteries is too strong. The arteries are the blood vessels that carry blood from the heart throughout the body. Hypertension forces the heart to work harder to pump blood and may cause arteries to become narrow or stiff. Untreated or uncontrolled hypertension can lead to a heart attack, heart failure, a stroke, kidney disease, and other problems. A blood pressure reading consists of a higher number over a lower number. Ideally, your blood pressure should be below 120/80. The first ("top") number is called the systolic pressure. It is a measure of the pressure in your arteries as your heart beats. The second ("bottom") number is called the diastolic pressure. It is a measure of the pressure in your arteries as the heart relaxes. What are the causes? The exact cause of this condition is not known. There are some conditions that result in  high blood pressure. What increases the risk? Certain factors may make you more likely to develop high blood pressure. Some of these risk factors are under your control, including: Smoking. Not getting enough exercise or physical activity. Being overweight. Having too much fat, sugar, calories, or salt (sodium) in your diet. Drinking too much alcohol. Other risk factors include: Having a personal history of heart disease, diabetes, high cholesterol, or kidney disease. Stress. Having a family history of high blood pressure and high cholesterol. Having obstructive sleep apnea. Age. The risk increases with age. What are the signs or symptoms? High blood pressure may not cause symptoms. Very high blood pressure (hypertensive crisis) may cause: Headache. Fast or irregular heartbeats (palpitations). Shortness of breath. Nosebleed. Nausea and vomiting. Vision changes. Severe chest pain, dizziness, and seizures. How is this diagnosed? This condition is diagnosed by measuring your blood pressure while you are seated, with your arm resting on a flat surface, your legs uncrossed, and your feet flat on the floor. The cuff of the blood pressure monitor will be placed directly against the skin of your upper arm at the level of your heart. Blood pressure should be measured at least twice using the same arm. Certain conditions can cause a difference in blood pressure between your right and left arms. If you have a high blood pressure reading during one visit or you have normal blood pressure with other risk factors, you may be asked to: Return on a different day to have your blood pressure checked again. Monitor your blood pressure at home for 1 week or longer. If you are diagnosed with hypertension, you may have other blood or imaging tests to help your health care provider understand your overall risk for other conditions. How is this treated? This condition is treated by making healthy lifestyle  changes, such as eating healthy foods, exercising more, and reducing your alcohol intake. You may be referred for counseling on a healthy diet and physical activity. Your health care provider may prescribe medicine if lifestyle changes are not enough to get your blood pressure under control and if: Your systolic blood pressure is above 130. Your diastolic blood pressure is above 80. Your personal target blood pressure may vary depending on your medical conditions, your age, and other factors. Follow these instructions at home: Eating and drinking  Eat a diet that is high in fiber and potassium, and low in sodium, added sugar, and fat. An example of this eating plan is called the DASH diet. DASH stands for Dietary Approaches to Stop Hypertension. To eat this way: Eat plenty of fresh fruits and vegetables. Try  to fill one half of your plate at each meal with fruits and vegetables. Eat whole grains, such as whole-wheat pasta, brown rice, or whole-grain bread. Fill about one fourth of your plate with whole grains. Eat or drink low-fat dairy products, such as skim milk or low-fat yogurt. Avoid fatty cuts of meat, processed or cured meats, and poultry with skin. Fill about one fourth of your plate with lean proteins, such as fish, chicken without skin, beans, eggs, or tofu. Avoid pre-made and processed foods. These tend to be higher in sodium, added sugar, and fat. Reduce your daily sodium intake. Many people with hypertension should eat less than 1,500 mg of sodium a day. Do not drink alcohol if: Your health care provider tells you not to drink. You are pregnant, may be pregnant, or are planning to become pregnant. If you drink alcohol: Limit how much you have to: 0-1 drink a day for women. 0-2 drinks a day for men. Know how much alcohol is in your drink. In the [place] one drink equals one 12 oz bottle of beer (355 mL), one 5 oz glass of wine (148 mL), or one 1 oz glass of hard liquor (44  mL). Lifestyle  Work with your health care provider to maintain a healthy body weight or to lose weight. Ask what an ideal weight is for you. Get at least 30 minutes of exercise that causes your heart to beat faster (aerobic exercise) most days of the week. Activities may include walking, swimming, or biking. Include exercise to strengthen your muscles (resistance exercise), such as Pilates or lifting weights, as part of your weekly exercise routine. Try to do these types of exercises for 30 minutes at least 3 days a week. Do not use any products that contain nicotine or tobacco. These products include cigarettes, chewing tobacco, and vaping devices, such as e-cigarettes. If you need help quitting, ask your health care provider. Monitor your blood pressure at home as told by your health care provider. Keep all follow-up visits. This is important. Medicines Take over-the-counter and prescription medicines only as told by your health care provider. Follow directions carefully. Blood pressure medicines must be taken as prescribed. Do not skip doses of blood pressure medicine. Doing this puts you at risk for problems and can make the medicine less effective. Ask your health care provider about side effects or reactions to medicines that you should watch for. Contact a health care provider if you: Think you are having a reaction to a medicine you are taking. Have headaches that keep coming back (recurring). Feel dizzy. Have swelling in your ankles. Have trouble with your vision. Get help right away if you: Develop a severe headache or confusion. Have unusual weakness or numbness. Feel faint. Have severe pain in your chest or abdomen. Vomit repeatedly. Have trouble breathing. These symptoms may be an emergency. Get help right away. Call 911. Do not wait to see if the symptoms will go away. Do not drive yourself to the hospital. Summary Hypertension is when the force of blood pumping through  your arteries is too strong. If this condition is not controlled, it may put you at risk for serious complications. Your personal target blood pressure may vary depending on your medical conditions, your age, and other factors. For most people, a normal blood pressure is less than 120/80. Hypertension is treated with lifestyle changes, medicines, or a combination of both. Lifestyle changes include losing weight, eating a healthy, low-sodium diet, exercising more, and limiting alcohol.  This information is not intended to replace advice given to you by your health care provider. Make sure you discuss any questions you have with your health care provider. Document Revised: 01/18/2021 Document Reviewed: 01/18/2021 Elsevier Patient Education  2024 Elsevier Inc.    Maryagnes Small, MD [facility]

## 2023-07-30 NOTE — Assessment & Plan Note (Signed)
Stable.  Continues omeprazole 40 mg daily

## 2023-07-31 ENCOUNTER — Other Ambulatory Visit: Payer: Self-pay | Admitting: Emergency Medicine

## 2023-07-31 ENCOUNTER — Encounter: Payer: Self-pay | Admitting: Emergency Medicine

## 2023-07-31 DIAGNOSIS — E1121 Type 2 diabetes mellitus with diabetic nephropathy: Secondary | ICD-10-CM

## 2023-07-31 MED ORDER — KERENDIA 10 MG PO TABS: 10.0000 mg | ORAL_TABLET | Freq: Every day | ORAL | 3 refills | Status: AC

## 2023-08-03 ENCOUNTER — Other Ambulatory Visit: Payer: Self-pay | Admitting: Internal Medicine

## 2023-08-09 DIAGNOSIS — N1831 Chronic kidney disease, stage 3a: Secondary | ICD-10-CM | POA: Diagnosis not present

## 2023-08-09 DIAGNOSIS — I10 Essential (primary) hypertension: Secondary | ICD-10-CM | POA: Diagnosis not present

## 2023-08-09 DIAGNOSIS — E1165 Type 2 diabetes mellitus with hyperglycemia: Secondary | ICD-10-CM | POA: Diagnosis not present

## 2023-08-09 DIAGNOSIS — E10319 Type 1 diabetes mellitus with unspecified diabetic retinopathy without macular edema: Secondary | ICD-10-CM | POA: Diagnosis not present

## 2023-08-13 ENCOUNTER — Encounter: Payer: Self-pay | Admitting: Emergency Medicine

## 2023-08-14 ENCOUNTER — Other Ambulatory Visit: Payer: Self-pay | Admitting: Radiology

## 2023-08-14 DIAGNOSIS — T7840XD Allergy, unspecified, subsequent encounter: Secondary | ICD-10-CM

## 2023-08-14 NOTE — Telephone Encounter (Signed)
 Refer him to allergist please.  Thanks.

## 2023-09-07 ENCOUNTER — Encounter (INDEPENDENT_AMBULATORY_CARE_PROVIDER_SITE_OTHER): Admitting: Ophthalmology

## 2023-09-07 DIAGNOSIS — E113513 Type 2 diabetes mellitus with proliferative diabetic retinopathy with macular edema, bilateral: Secondary | ICD-10-CM | POA: Diagnosis not present

## 2023-09-07 DIAGNOSIS — Z794 Long term (current) use of insulin: Secondary | ICD-10-CM

## 2023-09-07 DIAGNOSIS — H35033 Hypertensive retinopathy, bilateral: Secondary | ICD-10-CM

## 2023-09-07 DIAGNOSIS — I10 Essential (primary) hypertension: Secondary | ICD-10-CM

## 2023-09-13 ENCOUNTER — Telehealth: Payer: Self-pay | Admitting: Adult Health

## 2023-09-13 ENCOUNTER — Ambulatory Visit: Payer: Medicare HMO | Admitting: Adult Health

## 2023-09-13 NOTE — Telephone Encounter (Signed)
 Pt daughter has r/s pt's appointment

## 2023-09-13 NOTE — Telephone Encounter (Signed)
 LVM and sent mychart msg informing pt of need to reschedule 09/13/23 appt - NP out

## 2023-09-18 ENCOUNTER — Other Ambulatory Visit: Payer: Self-pay | Admitting: Internal Medicine

## 2023-09-18 ENCOUNTER — Encounter: Payer: Self-pay | Admitting: Internal Medicine

## 2023-09-18 NOTE — Telephone Encounter (Signed)
 Please advise Sir, thank you.

## 2023-09-21 DIAGNOSIS — D631 Anemia in chronic kidney disease: Secondary | ICD-10-CM | POA: Diagnosis not present

## 2023-09-21 DIAGNOSIS — N189 Chronic kidney disease, unspecified: Secondary | ICD-10-CM | POA: Diagnosis not present

## 2023-09-21 DIAGNOSIS — N183 Chronic kidney disease, stage 3 unspecified: Secondary | ICD-10-CM | POA: Diagnosis not present

## 2023-09-21 DIAGNOSIS — E1122 Type 2 diabetes mellitus with diabetic chronic kidney disease: Secondary | ICD-10-CM | POA: Diagnosis not present

## 2023-09-21 DIAGNOSIS — N2581 Secondary hyperparathyroidism of renal origin: Secondary | ICD-10-CM | POA: Diagnosis not present

## 2023-09-21 DIAGNOSIS — E559 Vitamin D deficiency, unspecified: Secondary | ICD-10-CM | POA: Diagnosis not present

## 2023-09-21 DIAGNOSIS — I129 Hypertensive chronic kidney disease with stage 1 through stage 4 chronic kidney disease, or unspecified chronic kidney disease: Secondary | ICD-10-CM | POA: Diagnosis not present

## 2023-10-19 ENCOUNTER — Encounter (INDEPENDENT_AMBULATORY_CARE_PROVIDER_SITE_OTHER): Admitting: Ophthalmology

## 2023-10-19 ENCOUNTER — Encounter: Payer: Self-pay | Admitting: Emergency Medicine

## 2023-10-19 ENCOUNTER — Other Ambulatory Visit: Payer: Self-pay | Admitting: Emergency Medicine

## 2023-10-19 DIAGNOSIS — Z794 Long term (current) use of insulin: Secondary | ICD-10-CM | POA: Diagnosis not present

## 2023-10-19 DIAGNOSIS — E113513 Type 2 diabetes mellitus with proliferative diabetic retinopathy with macular edema, bilateral: Secondary | ICD-10-CM

## 2023-10-19 DIAGNOSIS — I10 Essential (primary) hypertension: Secondary | ICD-10-CM

## 2023-10-19 DIAGNOSIS — F330 Major depressive disorder, recurrent, mild: Secondary | ICD-10-CM

## 2023-10-19 DIAGNOSIS — H35033 Hypertensive retinopathy, bilateral: Secondary | ICD-10-CM | POA: Diagnosis not present

## 2023-10-23 ENCOUNTER — Ambulatory Visit: Admitting: Allergy and Immunology

## 2023-10-25 ENCOUNTER — Other Ambulatory Visit: Payer: Self-pay | Admitting: Internal Medicine

## 2023-10-25 ENCOUNTER — Encounter: Payer: Self-pay | Admitting: Internal Medicine

## 2023-10-30 ENCOUNTER — Encounter: Payer: Self-pay | Admitting: Emergency Medicine

## 2023-10-30 ENCOUNTER — Ambulatory Visit (INDEPENDENT_AMBULATORY_CARE_PROVIDER_SITE_OTHER): Admitting: Emergency Medicine

## 2023-10-30 VITALS — BP 130/84 | HR 59 | Temp 98.1°F | Ht 66.0 in | Wt 196.0 lb

## 2023-10-30 DIAGNOSIS — E1159 Type 2 diabetes mellitus with other circulatory complications: Secondary | ICD-10-CM | POA: Diagnosis not present

## 2023-10-30 DIAGNOSIS — E785 Hyperlipidemia, unspecified: Secondary | ICD-10-CM

## 2023-10-30 DIAGNOSIS — E1165 Type 2 diabetes mellitus with hyperglycemia: Secondary | ICD-10-CM | POA: Diagnosis not present

## 2023-10-30 DIAGNOSIS — K2100 Gastro-esophageal reflux disease with esophagitis, without bleeding: Secondary | ICD-10-CM

## 2023-10-30 DIAGNOSIS — E1142 Type 2 diabetes mellitus with diabetic polyneuropathy: Secondary | ICD-10-CM

## 2023-10-30 DIAGNOSIS — E1169 Type 2 diabetes mellitus with other specified complication: Secondary | ICD-10-CM | POA: Diagnosis not present

## 2023-10-30 DIAGNOSIS — N1831 Chronic kidney disease, stage 3a: Secondary | ICD-10-CM | POA: Diagnosis not present

## 2023-10-30 DIAGNOSIS — I2510 Atherosclerotic heart disease of native coronary artery without angina pectoris: Secondary | ICD-10-CM

## 2023-10-30 DIAGNOSIS — I152 Hypertension secondary to endocrine disorders: Secondary | ICD-10-CM

## 2023-10-30 DIAGNOSIS — Z7984 Long term (current) use of oral hypoglycemic drugs: Secondary | ICD-10-CM

## 2023-10-30 DIAGNOSIS — K3184 Gastroparesis: Secondary | ICD-10-CM

## 2023-10-30 DIAGNOSIS — Z794 Long term (current) use of insulin: Secondary | ICD-10-CM

## 2023-10-30 DIAGNOSIS — E1143 Type 2 diabetes mellitus with diabetic autonomic (poly)neuropathy: Secondary | ICD-10-CM | POA: Diagnosis not present

## 2023-10-30 LAB — POCT GLYCOSYLATED HEMOGLOBIN (HGB A1C): Hemoglobin A1C: 7.2 % — AB (ref 4.0–5.6)

## 2023-10-30 MED ORDER — ZEGALOGUE 0.6 MG/0.6ML ~~LOC~~ SOSY
0.6000 mg | PREFILLED_SYRINGE | SUBCUTANEOUS | 2 refills | Status: DC | PRN
Start: 2023-10-30 — End: 2024-01-02

## 2023-10-30 NOTE — Patient Instructions (Signed)

## 2023-10-30 NOTE — Progress Notes (Signed)
 Joseph [name] 68 y.o.   Chief Complaint  Patient presents with   Follow-up    Patient here for 3 month f/u for HTN / DM. No other concerns     HISTORY OF PRESENT ILLNESS: This is a 68 y.o. male here for 73-month follow-up of hypertension and diabetes Blood pressure much improved. Has no complaints or medical concerns today. BP Readings from Last 3 Encounters:  07/30/23 (!) 190/91  03/13/23 (!) 171/82  02/08/23 (!) 198/69     HPI   Prior to Admission medications   Medication Sig Start Date End Date Taking? Authorizing Provider  amLODipine  (NORVASC ) 5 MG tablet Take 1 tablet (5 mg total) by mouth daily. 07/30/23  Yes Jerline Linzy, Emil Schanz, MD  Ascorbic Acid (VITAMIN C PO) Take by mouth.   Yes [provider]  aspirin  EC 81 MG tablet Take 81 mg by mouth daily.   Yes [provider]  B-D UF III MINI PEN NEEDLES 31G X 5 MM MISC Inject into the skin 3 (three) times daily. 12/06/20  Yes [provider]  B-D UF III MINI PEN NEEDLES 31G X 5 MM MISC CHECK THREE TIMES DAILY DX E11.9 12/22/21  Yes Macayla Ekdahl, Emil Schanz, MD  busPIRone  (BUSPAR ) 5 MG tablet Take 1 tablet (5 mg total) by mouth 2 (two) times daily as needed. 04/23/23  Yes Nimai Burbach, Emil Schanz, MD  Cholecalciferol  (VITAMIN D3) 25 MCG (1000 UT) CAPS Take 1 capsule (1,000 Units total) by mouth daily. Follow-up appt due w/ Dr. Purcell must see provider for future refills 07/27/22  Yes Hannan Tetzlaff, Emil Schanz, MD  cyclobenzaprine  (FLEXERIL ) 10 MG tablet Take 1 tablet by mouth three times daily as needed for muscle spasm 10/25/23  Yes Avram Lupita BRAVO, MD  DULoxetine  (CYMBALTA ) 60 MG capsule Take 1 capsule by mouth once daily 10/19/23  Yes Kimm Ungaro [name], MD  ergocalciferol (VITAMIN D2) 1.25 MG (50000 UT) capsule Take 50,000 Units by mouth every Wednesday. 04/09/20  Yes [provider]  famotidine  (PEPCID ) 40 MG tablet TAKE 1 TABLET BY MOUTH EVERYDAY AT BEDTIME 11/29/22  Yes Ameah Chanda, [place], MD   FARXIGA 10 MG TABS tablet Take 10 mg by mouth daily. 12/14/20  Yes [provider]  fenofibrate  160 MG tablet TAKE 1 TABLET BY MOUTH EVERY DAY 01/16/23  Yes Deondray Ospina, Emil Schanz, MD  ferrous sulfate  325 (65 FE) MG tablet Take 325 mg by mouth daily with breakfast.   Yes [provider]  Finerenone  (KERENDIA ) 10 MG TABS Take 1 tablet (10 mg total) by mouth daily. 07/31/23  Yes Izetta Sakamoto, Emil Schanz, MD  furosemide  (LASIX ) 20 MG tablet TAKE 1 TABLET (20 MG TOTAL) BY MOUTH DAILY AS NEEDED FOR EDEMA. 11/06/22  Yes [name], Vinie BROCKS, MD  glucose blood (ONETOUCH VERIO) test strip CHECK SUGARS 4 TIMES A DAY DX E11.9 11/26/20  Yes [provider]  insulin  aspart (NOVOLOG  FLEXPEN) 100 UNIT/ML FlexPen INJECT 3 TIMES A DAY PER SLIDING SCALE UP TO 30 UNITS A DAY DX E11.9 05/09/22  Yes Ketrina Boateng, Emil Schanz, MD  insulin  degludec (TRESIBA  FLEXTOUCH) 100 UNIT/ML FlexTouch Pen Inject 26 Units into the skin daily. 10/09/22  Yes Jevon Littlepage, Emil Schanz, MD  Insulin  Pen Needle (B-D UF III MINI PEN NEEDLES) 31G X 5 MM MISC CHECK THREE TIMES DAILY DX E11.9 05/10/20  Yes [provider]  Insulin  Pen Needle (B-D UF III MINI PEN NEEDLES) 31G X 5 MM MISC CHECK THREE TIMES DAILY DX E11.9 05/10/20  Yes [provider]  Insulin  Syringe-Needle U-100 31G X 5/16 0.3 ML MISC CHECK THREE TIMES DAILY DX E11.9 05/10/20  Yes [provider]  Lancets (ONETOUCH DELICA PLUS LANCET33G) MISC Apply topically. 12/10/20  Yes [provider]  levocetirizine (XYZAL ) 5 MG tablet TAKE 1 TABLET BY MOUTH EVERY DAY IN THE EVENING 01/12/23  Yes Aylana Hirschfeld [name], MD  metoprolol  tartrate (LOPRESSOR ) 25 MG tablet Take 1 tablet by mouth twice daily 07/04/23  Yes Iran Kievit [name], MD  nitroGLYCERIN  (NITROSTAT ) 0.4 MG SL tablet Place 1 tablet (0.4 mg total) under the tongue every 5 (five) minutes as needed for chest pain. 08/10/20  Yes [name], Vinie BROCKS, MD  omeprazole  (PRILOSEC) 40 MG capsule Take 1  capsule (40 mg total) by mouth daily. 06/05/23  Yes Doha Boling, Emil Schanz, MD  [facility] VERIO test strip 4 (four) times daily. 11/26/20  Yes [provider]  pravastatin  (PRAVACHOL ) 40 MG tablet TAKE 1 TABLET BY MOUTH DAILY. PLEASE KEEP UPCOMING APPT IN JANUARY 2023 12/01/22  Yes [name], Vinie BROCKS, MD  valsartan -hydrochlorothiazide  (DIOVAN -HCT) 160-12.5 MG tablet Take 1 tablet by mouth daily. 07/30/23  Yes Jamaar Howes [name], MD  budesonide-formoterol (SYMBICORT) 160-4.5 MCG/ACT inhaler Inhale 2 puffs into the lungs 2 (two) times daily as needed (asthma). Patient not taking: Reported on 10/30/2023    [provider]  metoCLOPramide  (REGLAN ) 10 MG tablet Take 1 tablet (10 mg total) by mouth every 6 (six) hours as needed. for nausea Patient not taking: Reported on 10/30/2023 04/12/22   Purcell Emil Schanz, MD    Allergies  Allergen Reactions   Metformin Diarrhea and Other (See Comments)    diarrhea   Robaxin [Methocarbamol] Itching and Rash    Patient Active Problem List   Diagnosis Date Noted   Stage 3a chronic kidney disease (HCC) 08/16/2022   Sleep apnea 04/20/2022   Situational mixed anxiety and depressive disorder 03/14/2022   Dyslipidemia associated with type 2 diabetes mellitus (HCC) 08/03/2021   Depression 02/01/2021   Gastro-esophageal reflux disease with esophagitis 02/01/2021   Gastric polyps - hyperplastic 03/19/2019   Diabetic autonomic neuropathy (HCC) 03/19/2019   Benign prostatic hyperplasia 01/16/2019   Chronic low back pain 01/16/2019   Iron deficiency anemia 01/16/2019   Dyslipidemia 03/10/2016   Atherosclerotic heart disease of native coronary artery without angina pectoris 05/03/2015   Diabetic peripheral neuropathy associated with type 2 diabetes mellitus (HCC) 05/03/2015   Enlarged prostate without lower urinary tract symptoms (luts) 04/30/2015   Major depressive disorder, recurrent, mild (HCC) 04/30/2015   Peripheral vascular disease (HCC) 04/30/2015    Hx of adenomatous colonic polyps 06/11/2014   Hyperlipidemia 02/25/2014   Diabetic gastroparesis (HCC) 02/17/2014   Neuropathy 12/03/2013   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   Presence of aortocoronary bypass graft 02/11/2012   Coronary arteriography abnormal 02/06/2012   Chronic kidney disease, stage 2 (mild) 01/29/2012   Type 1 diabetes mellitus with complication (HCC) 07/27/2007   Hypertension associated with diabetes (HCC) 07/27/2007   GERD 07/27/2007   ARTHRITIS 07/27/2007   NEPHROLITHIASIS, HX OF 07/27/2007    Past Medical History:  Diagnosis Date   Anemia    Anxiety    Arthritis    BPH (benign prostatic hypertrophy)    Cataract    Chronic renal insufficiency, stage III (moderate) (HCC)    [place] kidney   Coronary artery disease    Dr Mona    Depression    Diabetes mellitus without complication (HCC) 1988   insulin  dependent   Diabetic autonomic neuropathy (  HCC) 03/19/2019   Gastric polyps - hyperplastic 03/19/2019   Gastroparesis due to DM [facility])    GERD (gastroesophageal reflux disease)    Hx of adenomatous colonic polyps 06/11/2014   Hyperlipidemia    Hypertension    Myocardial infarction [facility])    Neuropathy    PVD (peripheral vascular disease) (HCC)    Sleep apnea 2010   refuses to use cpap    Past Surgical History:  Procedure Laterality Date   ANKLE FRACTURE SURGERY Right    BIOPSY  02/08/2023   Procedure: BIOPSY;  Surgeon: Avram Lupita BRAVO, MD;  Location: [facility];  Service: Gastroenterology;;   CARDIAC CATHETERIZATION  01/30/2012   This demonstrated proximal to mid LAD disease and disease of 2 large OM vessels concerning for surgical disease.   COLONOSCOPY     CORONARY ARTERY BYPASS GRAFT  02/06/2012   Procedure: CORONARY ARTERY BYPASS GRAFTING (CABG); 5- Vessel bypass a LIMA to the LAD, a saphenous vein to the to the diagonal , a saphenous vein to the distal OM, and sequential reverse saphenous vien graft to the posterior descending and 2nd OM. Surgeon:  D[place] KATHEE Jude, MD;  Location: [facility] OR;  Service: Open Heart Surgery;  Laterality: [name];   ESOPHAGOGASTRODUODENOSCOPY  2005   ESOPHAGOGASTRODUODENOSCOPY [name] 02/08/2023   Procedure: ESOPHAGOGASTRODUODENOSCOPY (EGD);  Surgeon: Avram Lupita BRAVO, MD;  Location: [facility];  Service: Gastroenterology;  Laterality: [name];   GASTRIC VARICES BANDING  02/08/2023   Procedure: GASTRIC POLYPS BANDING;  Surgeon: Avram Lupita BRAVO, MD;  Location: [facility];  Service: Gastroenterology;;   LEFT HEART CATHETERIZATION WITH CORONARY ANGIOGRAM [name] 01/30/2012   Procedure: LEFT HEART CATHETERIZATION WITH CORONARY ANGIOGRAM;  Surgeon: Vinie KYM Maxcy, MD;  Location[facility] C[facility] LAB;  Service: Cardiovascular;  Laterality: [name];   teeth removal  08/16/2021   TOOTH EXTRACTION [name] 09/16/2021   Procedure: DENTAL RESTORATION/EXTRACTIONS;  Surgeon: Sheryle Hamilton, DMD;  Location: [facility] OR;  Service: Oral Surgery;  Laterality: [name];   UPPER GASTROINTESTINAL ENDOSCOPY      Social History   Socioeconomic History   Marital status: Widowed    Spouse name: Not on file   Number of children: 4   Years of education: Not on file   Highest education level: 5th grade  Occupational History   Occupation: disabled  Tobacco Use   Smoking status: Never   Smokeless tobacco: Never  Vaping Use   Vaping status: Never Used  Substance and Sexual Activity   Alcohol use: No   Drug use: No   Sexual activity: Not Currently  Other Topics Concern   Not on file  Social History Narrative   Widowed, 1 son and 4 daughters   Disabled and dual eligible Medicare and Medicaid   One caffeinated beverage daily   Social Drivers of Corporate investment banker Strain: Low Risk  (10/30/2023)   Overall Financial Resource Strain (CARDIA)    Difficulty of Paying Living Expenses: Not hard at all  Food Insecurity: No Food Insecurity (10/30/2023)   Hunger Vital Sign    Worried About Running Out of Food in the Last Year: Never true    Ran Out of Food in the  Last Year: Never true  Transportation Needs: No Transportation Needs (10/30/2023)   PRAPARE - Administrator, Civil Service (Medical): No    Lack of Transportation (Non-Medical): No  Physical Activity: Unknown (10/30/2023)   Exercise Vital Sign    Days of Exercise per Week: Patient declined    Minutes of  Exercise per Session: Not on file  Stress: Stress Concern Present (10/30/2023)   [employer] of Occupational Health - Occupational Stress Questionnaire    Feeling of Stress: To some extent  Social Connections: Socially Isolated (10/30/2023)   Social Connection and Isolation Panel    Frequency of Communication with Friends and Family: Three times a week    Frequency of Social Gatherings with Friends and Family: Three times a week    Attends Religious Services: Patient declined    Active Member of Clubs or Organizations: No    Attends Banker Meetings: Not on file    Marital Status: Widowed  Intimate Partner Violence: Not At Risk (02/03/2022)   Humiliation, Afraid, Rape, and Kick questionnaire    Fear of Current or Ex-Partner: No    Emotionally Abused: No    Physically Abused: No    Sexually Abused: No    Family History  Problem Relation Age of Onset   Diabetes Father    Hypertension Father    Cancer Sister        unknown   Colon cancer Neg Hx    Esophageal cancer Neg Hx    Rectal cancer Neg Hx    Stomach cancer Neg Hx    Pancreatic cancer Neg Hx    Sleep apnea Neg Hx      Review of Systems  Constitutional: Negative.  Negative for chills and fever.  HENT: Negative.  Negative for congestion and sore throat.   Respiratory: Negative.  Negative for cough and shortness of breath.   Cardiovascular: Negative.  Negative for chest pain and palpitations.  Gastrointestinal:  Negative for abdominal pain, diarrhea, nausea and vomiting.  Genitourinary: Negative.  Negative for dysuria and hematuria.  Skin: Negative.  Negative for rash.  Neurological: Negative.   Negative for dizziness and headaches.  All other systems reviewed and are negative.   Today's Vitals   10/30/23 1313  BP: 130/84  Pulse: (!) 59  Temp: 98.1 F (36.7 C)  TempSrc: Oral  SpO2: 100%  Weight: 196 lb (88.9 kg)  Height: 5' 6 (1.676 m)   Body mass index is 31.64 kg/m.   Physical Exam Vitals reviewed.  Constitutional:      Appearance: Normal appearance.  HENT:     Head: Normocephalic.     Mouth/Throat:     Mouth: Mucous membranes are moist.     Pharynx: Oropharynx is clear.  Eyes:     Extraocular Movements: Extraocular movements intact.     Conjunctiva/sclera: Conjunctivae normal.     Pupils: Pupils are equal, round, and reactive to light.  Cardiovascular:     Rate and Rhythm: Normal rate and regular rhythm.     Pulses: Normal pulses.     Heart sounds: Normal heart sounds.  Pulmonary:     Effort: Pulmonary effort is normal.     Breath sounds: Normal breath sounds.  Abdominal:     Palpations: Abdomen is soft.     Tenderness: There is no abdominal tenderness.  Musculoskeletal:     Cervical back: No tenderness.  Lymphadenopathy:     Cervical: No cervical adenopathy.  Skin:    General: Skin is warm and dry.     Capillary Refill: Capillary refill takes less than 2 seconds.  Neurological:     General: No focal deficit present.     Mental Status: He is alert and oriented to person, place, and time.  Psychiatric:        Mood and Affect: Mood normal.  Behavior: Behavior normal.    Results for orders placed or performed in visit on 10/30/23 (from the past 24 hours)  POCT HgB A1C     Status: Abnormal   Collection Time: 10/30/23  1:28 PM  Result Value Ref Range   Hemoglobin A1C 7.2 (A) 4.0 - 5.6 %   HbA1c POC (<> result, manual entry)     HbA1c, POC (prediabetic range)     HbA1c, POC (controlled diabetic range)       ASSESSMENT & PLAN: I personally spent a total of 44 minutes minutes in the care of the patient today including preparing to see  the patient, getting/reviewing separately obtained history, performing a medically appropriate exam/evaluation, counseling and educating, placing orders, documenting clinical information in the EHR, independently interpreting results, communicating results, coordinating care, and review of health maintenance items, prognosis and need for follow-up.   Problem List Items Addressed This Visit       Cardiovascular and Mediastinum   Hypertension associated with diabetes (HCC) - Primary   BP Readings from Last 3 Encounters:  10/30/23 130/84  07/30/23 (!) 190/91  03/13/23 (!) 171/82  Well-controlled hypertension Continue amlodipine  5 mg daily, and valsartan  HCT 160-12.5 mg daily.  Also continue metoprolol  25 mg twice a day Hemoglobin A1c is 7.2, slightly higher than before Diet and nutrition discussed Cardiovascular risks associated with uncontrolled diabetes discussed Continue Farxiga 10 mg daily, Tresiba  insulin  26 units daily and Premeal insulin  aspart as per sliding scale Diet and nutrition discussed Lab work today Follow-up in 6 months      Relevant Medications   Dasiglucagon  HCl (ZEGALOGUE ) 0.6 MG/0.6ML SOSY   Atherosclerotic heart disease of native coronary artery without angina pectoris   No recent anginal episodes Continues daily baby aspirin  and beta-blocker with metoprolol  tartrate 25 mg twice a day Continues pravastatin  40 mg daily        Digestive   Diabetic gastroparesis (HCC)   Stable.  No longer taking Reglan  Not a candidate for GLP-1 agonists      Relevant Medications   Dasiglucagon  HCl (ZEGALOGUE ) 0.6 MG/0.6ML SOSY   Gastro-esophageal reflux disease with esophagitis   Stable.  Continues omeprazole  40 mg daily        Endocrine   Diabetic peripheral neuropathy associated with type 2 diabetes mellitus (HCC)   Stable.  Continue Cymbalta  60 mg daily      Relevant Medications   Dasiglucagon  HCl (ZEGALOGUE ) 0.6 MG/0.6ML SOSY   Dyslipidemia associated with type 2  diabetes mellitus (HCC)   Stable chronic conditions Continues pravastatin  40 mg daily and fenofibrate  160 mg daily      Relevant Medications   Dasiglucagon  HCl (ZEGALOGUE ) 0.6 MG/0.6ML SOSY   Other Relevant Orders   POCT HgB A1C (Completed)     Genitourinary   Stage 3a chronic kidney disease (HCC)   Advised to stay well-hydrated and avoid NSAIDs Continue Farxiga 10 mg daily      Patient Instructions  Diabetes Mellitus and Nutrition, Adult When you have diabetes, or diabetes mellitus, it is very important to have healthy eating habits because your blood sugar (glucose) levels are greatly affected by what you eat and drink. Eating healthy foods in the right amounts, at about the same times every day, can help you: Manage your blood glucose. Lower your risk of heart disease. Improve your blood pressure. Reach or maintain a healthy weight. What can affect my meal plan? Every person with diabetes is different, and each person has different needs for a meal  plan. Your health care provider may recommend that you work with a dietitian to make a meal plan that is best for you. Your meal plan may vary depending on factors such as: The calories you need. The medicines you take. Your weight. Your blood glucose, blood pressure, and cholesterol levels. Your activity level. Other health conditions you have, such as heart or kidney disease. How do carbohydrates affect me? Carbohydrates, also called carbs, affect your blood glucose level more than any other type of food. Eating carbs raises the amount of glucose in your blood. It is important to know how many carbs you can safely have in each meal. This is different for every person. Your dietitian can help you calculate how many carbs you should have at each meal and for each snack. How does alcohol affect me? Alcohol can cause a decrease in blood glucose (hypoglycemia), especially if you use insulin  or take certain diabetes medicines by mouth.  Hypoglycemia can be a life-threatening condition. Symptoms of hypoglycemia, such as sleepiness, dizziness, and confusion, are similar to symptoms of having too much alcohol. Do not drink alcohol if: Your health care provider tells you not to drink. You are pregnant, may be pregnant, or are planning to become pregnant. If you drink alcohol: Limit how much you have to: 0-1 drink a day for women. 0-2 drinks a day for men. Know how much alcohol is in your drink. In the [place] one drink equals one 12 oz bottle of beer (355 mL), one 5 oz glass of wine (148 mL), or one 1 oz glass of hard liquor (44 mL). Keep yourself hydrated with water, diet soda, or unsweetened iced tea. Keep in mind that regular soda, juice, and other mixers may contain a lot of sugar and must be counted as carbs. What are tips for following this plan?  Reading food labels Start by checking the serving size on the Nutrition Facts label of packaged foods and drinks. The number of calories and the amount of carbs, fats, and other nutrients listed on the label are based on one serving of the item. Many items contain more than one serving per package. Check the total grams (g) of carbs in one serving. Check the number of grams of saturated fats and trans fats in one serving. Choose foods that have a low amount or none of these fats. Check the number of milligrams (mg) of salt (sodium) in one serving. Most people should limit total sodium intake to less than 2,300 mg per day. Always check the nutrition information of foods labeled as low-fat or nonfat. These foods may be higher in added sugar or refined carbs and should be avoided. Talk to your dietitian to identify your daily goals for nutrients listed on the label. Shopping Avoid buying canned, pre-made, or processed foods. These foods tend to be high in fat, sodium, and added sugar. Shop around the outside edge of the grocery store. This is where you will most often find fresh  fruits and vegetables, bulk grains, fresh meats, and fresh dairy products. Cooking Use low-heat cooking methods, such as baking, instead of high-heat cooking methods, such as deep frying. Cook using healthy oils, such as olive, canola, or sunflower oil. Avoid cooking with butter, cream, or high-fat meats. Meal planning Eat meals and snacks regularly, preferably at the same times every day. Avoid going long periods of time without eating. Eat foods that are high in fiber, such as fresh fruits, vegetables, beans, and whole grains. Eat 4-6 oz (  112-168 g) of lean protein each day, such as lean meat, chicken, fish, eggs, or tofu. One ounce (oz) (28 g) of lean protein is equal to: 1 oz (28 g) of meat, chicken, or fish. 1 egg.  cup (62 g) of tofu. Eat some foods each day that contain healthy fats, such as avocado, nuts, seeds, and fish. What foods should I eat? Fruits Berries. Apples. Oranges. Peaches. Apricots. Plums. Grapes. Mangoes. Papayas. Pomegranates. Kiwi. Cherries. Vegetables Leafy greens, including lettuce, spinach, kale, chard, collard greens, mustard greens, and cabbage. Beets. Cauliflower. Broccoli. Carrots. Green beans. Tomatoes. Peppers. Onions. Cucumbers. Brussels sprouts. Grains Whole grains, such as whole-wheat or whole-grain bread, crackers, tortillas, cereal, and pasta. Unsweetened oatmeal. Quinoa. Brown or wild rice. Meats and other proteins Seafood. Poultry without skin. Lean cuts of poultry and beef. Tofu. Nuts. Seeds. Dairy Low-fat or fat-free dairy products such as milk, yogurt, and cheese. The items listed above may not be a complete list of foods and beverages you can eat and drink. Contact a dietitian for more information. What foods should I avoid? Fruits Fruits canned with syrup. Vegetables Canned vegetables. Frozen vegetables with butter or cream sauce. Grains Refined white flour and flour products such as bread, pasta, snack foods, and cereals. Avoid all  processed foods. Meats and other proteins Fatty cuts of meat. Poultry with skin. Breaded or fried meats. Processed meat. Avoid saturated fats. Dairy Full-fat yogurt, cheese, or milk. Beverages Sweetened drinks, such as soda or iced tea. The items listed above may not be a complete list of foods and beverages you should avoid. Contact a dietitian for more information. Questions to ask a health care provider Do I need to meet with a certified diabetes care and education specialist? Do I need to meet with a dietitian? What number can I call if I have questions? When are the best times to check my blood glucose? Where to find more information: American Diabetes Association: diabetes.org Academy of Nutrition and Dietetics: eatright.[employer] of Diabetes and Digestive and Kidney Diseases: StageSync.si Association of Diabetes Care & Education Specialists: diabeteseducator.org Summary It is important to have healthy eating habits because your blood sugar (glucose) levels are greatly affected by what you eat and drink. It is important to use alcohol carefully. A healthy meal plan will help you manage your blood glucose and lower your risk of heart disease. Your health care provider may recommend that you work with a dietitian to make a meal plan that is best for you. This information is not intended to replace advice given to you by your health care provider. Make sure you discuss any questions you have with your health care provider. Document Revised: 10/14/2019 Document Reviewed: 10/15/2019 Elsevier Patient Education  2024 Elsevier Inc.    Emil Schaumann, MD [facility]

## 2023-10-30 NOTE — Assessment & Plan Note (Signed)
 Stable chronic conditions Continues pravastatin  40 mg daily and fenofibrate  160 mg daily

## 2023-10-30 NOTE — Assessment & Plan Note (Signed)
Advised to stay well-hydrated and avoid NSAIDs. Continue Farxiga 10 mg daily 

## 2023-10-30 NOTE — Assessment & Plan Note (Signed)
Stable.  Continues omeprazole 40 mg daily

## 2023-10-30 NOTE — Assessment & Plan Note (Signed)
No recent anginal episodes Continues daily baby aspirin and beta-blocker with metoprolol tartrate 25 mg twice a day Continues pravastatin 40 mg daily

## 2023-10-30 NOTE — Assessment & Plan Note (Signed)
 Stable.  No longer taking Reglan  Not a candidate for GLP-1 agonists

## 2023-10-30 NOTE — Assessment & Plan Note (Signed)
 BP Readings from Last 3 Encounters:  10/30/23 130/84  07/30/23 (!) 190/91  03/13/23 (!) 171/82  Well-controlled hypertension Continue amlodipine  5 mg daily, and valsartan  HCT 160-12.5 mg daily.  Also continue metoprolol  25 mg twice a day Hemoglobin A1c is 7.2, slightly higher than before Diet and nutrition discussed Cardiovascular risks associated with uncontrolled diabetes discussed Continue Farxiga 10 mg daily, Tresiba  insulin  26 units daily and Premeal insulin  aspart as per sliding scale Diet and nutrition discussed Lab work today Follow-up in 6 months

## 2023-10-30 NOTE — Assessment & Plan Note (Signed)
Stable Continue Cymbalta 60mg daily

## 2023-11-04 ENCOUNTER — Other Ambulatory Visit: Payer: Self-pay | Admitting: Emergency Medicine

## 2023-11-04 ENCOUNTER — Other Ambulatory Visit: Payer: Self-pay | Admitting: Internal Medicine

## 2023-11-06 ENCOUNTER — Telehealth: Admitting: Family Medicine

## 2023-11-06 DIAGNOSIS — J069 Acute upper respiratory infection, unspecified: Secondary | ICD-10-CM

## 2023-11-06 MED ORDER — FLUTICASONE PROPIONATE 50 MCG/ACT NA SUSP: 2.0000 | Freq: Every day | NASAL | 6 refills | Status: AC

## 2023-11-06 MED ORDER — BENZONATATE 200 MG PO CAPS: 200.0000 mg | ORAL_CAPSULE | Freq: Two times a day (BID) | ORAL | 0 refills | Status: DC | PRN

## 2023-11-06 NOTE — Progress Notes (Signed)
 Virtual Visit Consent   [name], you are scheduled for a virtual visit with a [name] provider today. Just as with appointments in the office, your consent must be obtained to participate. Your consent will be active for this visit and any virtual visit you may have with one of our providers in the next 365 days. If you have a MyChart account, a copy of this consent can be sent to you electronically.  As this is a virtual visit, video technology does not allow for your provider to perform a traditional examination. This may limit your provider's ability to fully assess your condition. If your provider identifies any concerns that need to be evaluated in person or the need to arrange testing (such as labs, EKG, etc.), we will make arrangements to do so. Although advances in technology are sophisticated, we cannot ensure that it will always work on either your end or our end. If the connection with a video visit is poor, the visit may have to be switched to a telephone visit. With either a video or telephone visit, we are not always able to ensure that we have a secure connection.  By engaging in this virtual visit, you consent to the provision of healthcare and authorize for your insurance to be billed (if applicable) for the services provided during this visit. Depending on your insurance coverage, you may receive a charge related to this service.  I need to obtain your verbal consent now. Are you willing to proceed with your visit today? Joseph [name] has provided verbal consent on 11/06/2023 for a virtual visit (video or telephone). Loa Lamp, FNP  Date: 11/06/2023 4:35 PM   Virtual Visit via Video Note   I, Loa Lamp, connected with  Joseph [name]  ([id number], [birth year]) on 11/06/23 at  4:00 PM EDT by a video-enabled telemedicine application and verified that I am speaking with the correct person using two identifiers.  Location: Patient: Virtual Visit Location Patient:  Home Provider: Virtual Visit Location Provider: Home Office   I discussed the limitations of evaluation and management by telemedicine and the availability of in person appointments. The patient expressed understanding and agreed to proceed.    History of Present Illness: Joseph [name] is a 68 y.o. who identifies as a male who was assigned male at birth, and is being seen today for Sore throat,  runny nose, body aches, cough, no wheezing, no sob. He does have a headache. Fever gone. Sx x 2 days. [name]  HPI: HPI  Problems:  Patient Active Problem List   Diagnosis Date Noted   Stage 3a chronic kidney disease (HCC) 08/16/2022   Sleep apnea 04/20/2022   Situational mixed anxiety and depressive disorder 03/14/2022   Dyslipidemia associated with type 2 diabetes mellitus (HCC) 08/03/2021   Depression 02/01/2021   Gastro-esophageal reflux disease with esophagitis 02/01/2021   Gastric polyps - hyperplastic 03/19/2019   Diabetic autonomic neuropathy (HCC) 03/19/2019   Benign prostatic hyperplasia 01/16/2019   Chronic low back pain 01/16/2019   Iron deficiency anemia 01/16/2019   Dyslipidemia 03/10/2016   Atherosclerotic heart disease of native coronary artery without angina pectoris 05/03/2015   Diabetic peripheral neuropathy associated with type 2 diabetes mellitus (HCC) 05/03/2015   Enlarged prostate without lower urinary tract symptoms (luts) 04/30/2015   Major depressive disorder, recurrent, mild (HCC) 04/30/2015   Peripheral vascular disease (HCC) 04/30/2015   Hx of adenomatous colonic polyps 06/11/2014   Hyperlipidemia 02/25/2014   Diabetic gastroparesis (HCC) 02/17/2014  Neuropathy 12/03/2013   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   Presence of aortocoronary bypass graft 02/11/2012   Coronary arteriography abnormal 02/06/2012   Type 1 diabetes mellitus with complication (HCC) 07/27/2007   Hypertension associated with diabetes (HCC) 07/27/2007   GERD 07/27/2007   ARTHRITIS 07/27/2007    NEPHROLITHIASIS, HX OF 07/27/2007    Allergies:  Allergies  Allergen Reactions   Metformin Diarrhea and Other (See Comments)    diarrhea   Robaxin [Methocarbamol] Itching and Rash   Medications:  Current Outpatient Medications:    amLODipine  (NORVASC ) 5 MG tablet, Take 1 tablet (5 mg total) by mouth daily., Disp: 90 tablet, Rfl: 3   Ascorbic Acid (VITAMIN C PO), Take by mouth., Disp: , Rfl:    aspirin  EC 81 MG tablet, Take 81 mg by mouth daily., Disp: , Rfl:    B-D UF III MINI PEN NEEDLES 31G X 5 MM MISC, Inject into the skin 3 (three) times daily., Disp: , Rfl:    B-D UF III MINI PEN NEEDLES 31G X 5 MM MISC, CHECK THREE TIMES DAILY DX E11.9, Disp: 300 each, Rfl: 1   budesonide-formoterol (SYMBICORT) 160-4.5 MCG/ACT inhaler, Inhale 2 puffs into the lungs 2 (two) times daily as needed (asthma). (Patient not taking: Reported on 10/30/2023), Disp: , Rfl:    busPIRone  (BUSPAR ) 5 MG tablet, Take 1 tablet (5 mg total) by mouth 2 (two) times daily as needed., Disp: 30 tablet, Rfl: 1   Cholecalciferol  (VITAMIN D3) 25 MCG (1000 UT) CAPS, Take 1 capsule (1,000 Units total) by mouth daily. Follow-up appt due w/ Dr. Purcell must see provider for future refills, Disp: 90 capsule, Rfl: 0   cyclobenzaprine  (FLEXERIL ) 10 MG tablet, Take 1 tablet by mouth three times daily as needed for muscle spasm, Disp: 90 tablet, Rfl: 0   Dasiglucagon  HCl (ZEGALOGUE ) 0.6 MG/0.6ML SOSY, Inject 0.6 mg into the skin as needed., Disp: 0.6 mL, Rfl: 2   DULoxetine  (CYMBALTA ) 60 MG capsule, Take 1 capsule by mouth once daily, Disp: 90 capsule, Rfl: 0   ergocalciferol (VITAMIN D2) 1.25 MG (50000 UT) capsule, Take 50,000 Units by mouth every Wednesday., Disp: , Rfl:    famotidine  (PEPCID ) 40 MG tablet, TAKE 1 TABLET BY MOUTH EVERYDAY AT BEDTIME, Disp: 90 tablet, Rfl: 3   FARXIGA 10 MG TABS tablet, Take 10 mg by mouth daily., Disp: , Rfl:    fenofibrate  160 MG tablet, TAKE 1 TABLET BY MOUTH EVERY DAY, Disp: 90 tablet, Rfl: 3    ferrous sulfate  325 (65 FE) MG tablet, Take 325 mg by mouth daily with breakfast., Disp: , Rfl:    Finerenone  (KERENDIA ) 10 MG TABS, Take 1 tablet (10 mg total) by mouth daily., Disp: 90 tablet, Rfl: 3   furosemide  (LASIX ) 20 MG tablet, TAKE 1 TABLET (20 MG TOTAL) BY MOUTH DAILY AS NEEDED FOR EDEMA., Disp: 90 tablet, Rfl: 3   glucose blood (ONETOUCH VERIO) test strip, CHECK SUGARS 4 TIMES A DAY DX E11.9, Disp: , Rfl:    insulin  aspart (NOVOLOG  FLEXPEN) 100 UNIT/ML FlexPen, INJECT 3 TIMES A DAY PER SLIDING SCALE UP TO 30 UNITS A DAY DX E11.9, Disp: 3 mL, Rfl: 5   insulin  degludec (TRESIBA  FLEXTOUCH) 100 UNIT/ML FlexTouch Pen, Inject 26 Units into the skin daily., Disp: 3 mL, Rfl: 3   Insulin  Pen Needle (B-D UF III MINI PEN NEEDLES) 31G X 5 MM MISC, CHECK THREE TIMES DAILY DX E11.9, Disp: , Rfl:    Insulin  Pen Needle (B-D UF III  MINI PEN NEEDLES) 31G X 5 MM MISC, CHECK THREE TIMES DAILY DX E11.9, Disp: , Rfl:    Insulin  Syringe-Needle U-100 31G X 5/16 0.3 ML MISC, CHECK THREE TIMES DAILY DX E11.9, Disp: , Rfl:    Lancets (ONETOUCH DELICA PLUS LANCET33G) MISC, Apply topically., Disp: , Rfl:    levocetirizine (XYZAL ) 5 MG tablet, TAKE 1 TABLET BY MOUTH EVERY DAY IN THE EVENING, Disp: 90 tablet, Rfl: 3   metoCLOPramide  (REGLAN ) 10 MG tablet, Take 1 tablet (10 mg total) by mouth every 6 (six) hours as needed. for nausea (Patient not taking: Reported on 10/30/2023), Disp: 60 tablet, Rfl: 0   metoprolol  tartrate (LOPRESSOR ) 25 MG tablet, Take 1 tablet by mouth twice daily, Disp: 132 tablet, Rfl: 0   nitroGLYCERIN  (NITROSTAT ) 0.4 MG SL tablet, Place 1 tablet (0.4 mg total) under the tongue every 5 (five) minutes as needed for chest pain., Disp: 20 tablet, Rfl: 3   omeprazole  (PRILOSEC) 40 MG capsule, Take 1 capsule (40 mg total) by mouth daily., Disp: 90 capsule, Rfl: 1   ONETOUCH VERIO test strip, 4 (four) times daily., Disp: , Rfl:    pravastatin  (PRAVACHOL ) 40 MG tablet, TAKE 1 TABLET BY MOUTH ONCE DAILY.  PLEASE KEEP UPCOMING APPT IN JANUARY 2023, Disp: 30 tablet, Rfl: 0   valsartan -hydrochlorothiazide  (DIOVAN -HCT) 160-12.5 MG tablet, Take 1 tablet by mouth daily., Disp: 90 tablet, Rfl: 3  Observations/Objective: Patient is well-developed, well-nourished in no acute distress.  Resting comfortably  at home.  Head is normocephalic, atraumatic.  No labored breathing.  Speech is clear and coherent with logical content.  Patient is alert and oriented at baseline.    Assessment and Plan: 1. Viral URI with cough (Primary)  Increase fluids, humidifier at night, tylenol  or ibuprofen as directed, Obtain a covid and flu test from the pharmacy and take. UC as needed.   Follow Up Instructions: I discussed the assessment and treatment plan with the patient. The patient was provided an opportunity to ask questions and all were answered. The patient agreed with the plan and demonstrated an understanding of the instructions.  A copy of instructions were sent to the patient via MyChart unless otherwise noted below.     The patient was advised to call back or seek an in-person evaluation if the symptoms worsen or if the condition fails to improve as anticipated.    Havard Radigan, FNP

## 2023-11-06 NOTE — Patient Instructions (Signed)

## 2023-11-13 NOTE — Progress Notes (Deleted)
 [facility] [address] [place]. [place] [address] [phone]       OFFICE FOLLOW UP NOTE  [name] Date of Birth:  1956/03/01 Medical Record Number:  [id number]    Primary neurologist: Dr. Buck Reason for visit: Initial CPAP follow-up    SUBJECTIVE:   CHIEF COMPLAINT:  No chief complaint on file.   Follow-up visit:  Prior visit: 03/13/2023  Brief HPI:   Joseph [name] is a 68 y.o. male who was seen by Dr. Buck on 05/22/2022 for evaluation of sleep apnea.  Complex medical history of anemia, arthritis, BPH, chronic renal insufficiency, coronary artery disease with history of MI, status post 5 vessel CABG in 2013, depression, anxiety, diabetes, neuropathy, history of gastroparesis, reflux disease, hypertension, hyperlipidemia, neuropathy, PVD, and mild obesity, previously diagnosed with OSA over 20 years ago and recommended CPAP but he has not been on CPAP for years.  ESS 15/24. FSS 57/63.  Sleep study 12/05/2022 showed severe sleep apnea with total AHI 52.3/h and O2 nadir of 68% with evidence of nocturnal hypoxemia and qualified for split sleep study, responded well to PAP therapy however optimal pressure was not determined due to significant sleep fragmentation and was started on AutoPap.  AutoPap initiated 12/25/2022.  At prior visit, noted overall tolerating CPAP well although had not used for the past month prior to visit for unclear reason.  ESS 8/24, prior to CPAP 15/24.     Interval history:  Patient returns for CPAP follow-up visit    He is accompanied today by his daughter. Reports tolerating CPAP well, currently using nasal pillow, was unable to tolerate FFM.  He has not been using his CPAP over the past month for unclear reasons, he had no specific reason or concern for not using.  Family has noted that he is no longer snoring with CPAP use.  Still has daytime fatigue although ESS today 8/24 and prior to CPAP 15/24.  Blood pressure  today elevated, reports being monitored at home and typically stable but unable to say average readings.           ROS:   14 system review of systems performed and negative with exception of those listed in HPI  PMH:  Past Medical History:  Diagnosis Date   Anemia    Anxiety    Arthritis    BPH (benign prostatic hypertrophy)    Cataract    Chronic renal insufficiency, stage III (moderate) (HCC)    [facility]   Coronary artery disease    Dr Mona    Depression    Diabetes mellitus without complication (HCC) 1988   insulin  dependent   Diabetic autonomic neuropathy (HCC) 03/19/2019   Gastric polyps - hyperplastic 03/19/2019   Gastroparesis due to DM (HCC)    GERD (gastroesophageal reflux disease)    Hx of adenomatous colonic polyps 06/11/2014   Hyperlipidemia    Hypertension    Myocardial infarction (HCC)    Neuropathy    PVD (peripheral vascular disease) (HCC)    Sleep apnea 2010   refuses to use cpap    PSH:  Past Surgical History:  Procedure Laterality Date   ANKLE FRACTURE SURGERY Right    BIOPSY  02/08/2023   Procedure: BIOPSY;  Surgeon: Avram Lupita BRAVO, MD;  Location: [facility];  Service: Gastroenterology;;   CARDIAC CATHETERIZATION  01/30/2012   This demonstrated proximal to mid LAD disease and disease of 2 large OM vessels concerning for surgical disease.   COLONOSCOPY  CORONARY ARTERY BYPASS GRAFT  02/06/2012   Procedure: CORONARY ARTERY BYPASS GRAFTING (CABG); 5- Vessel bypass a LIMA to the LAD, a saphenous vein to the to the diagonal , a saphenous vein to the distal OM, and sequential reverse saphenous vien graft to the posterior descending and 2nd OM. Surgeon: Dallas KATHEE Jude, MD;  Location: [facility] OR;  Service: Open Heart Surgery;  Laterality: [name];   ESOPHAGOGASTRODUODENOSCOPY  2005   ESOPHAGOGASTRODUODENOSCOPY [name] 02/08/2023   Procedure: ESOPHAGOGASTRODUODENOSCOPY (EGD);  Surgeon: Avram Lupita BRAVO, MD;  Location: [facility];  Service:  Gastroenterology;  Laterality: [name];   GASTRIC VARICES BANDING  02/08/2023   Procedure: GASTRIC POLYPS BANDING;  Surgeon: Avram Lupita BRAVO, MD;  Location: [facility];  Service: Gastroenterology;;   LEFT HEART CATHETERIZATION WITH CORONARY ANGIOGRAM [name] 01/30/2012   Procedure: LEFT HEART CATHETERIZATION WITH CORONARY ANGIOGRAM;  Surgeon: Vinie KYM Maxcy, MD;  Location: [facility] CATH LAB;  Service: Cardiovascular;  Laterality: [name];   teeth removal  08/16/2021   TOOTH EXTRACTION [name] 09/16/2021   Procedure: DENTAL RESTORATION/EXTRACTIONS;  Surgeon: Sheryle Hamilton, DMD;  Location: [facility] OR;  Service: Oral Surgery;  Laterality: [name];   UPPER GASTROINTESTINAL ENDOSCOPY      Social History:  Social History   Socioeconomic History   Marital status: Widowed    Spouse name: Not on file   Number of children: 4   Years of education: Not on file   Highest education level: 5th grade  Occupational History   Occupation: disabled  Tobacco Use   Smoking status: Never   Smokeless tobacco: Never  Vaping Use   Vaping status: Never Used  Substance and Sexual Activity   Alcohol use: No   Drug use: No   Sexual activity: Not Currently  Other Topics Concern   Not on file  Social History Narrative   Widowed, 1 son and 4 daughters   Disabled and dual eligible Medicare and Medicaid   One caffeinated beverage daily   Social Drivers of Corporate investment banker Strain: Low Risk  (10/30/2023)   Overall Financial Resource Strain (CARDIA)    Difficulty of Paying Living Expenses: Not hard at all  Food Insecurity: No Food Insecurity (10/30/2023)   Hunger Vital Sign    Worried About Running Out of Food in the Last Year: Never true    Ran Out of Food in the Last Year: Never true  Transportation Needs: No Transportation Needs (10/30/2023)   PRAPARE - Administrator, Civil Service (Medical): No    Lack of Transportation (Non-Medical): No  Physical Activity: Unknown (10/30/2023)   Exercise Vital Sign    Days  of Exercise per Week: Patient declined    Minutes of Exercise per Session: Not on file  Stress: Stress Concern Present (10/30/2023)   [employer] of Occupational Health - Occupational Stress Questionnaire    Feeling of Stress: To some extent  Social Connections: Socially Isolated (10/30/2023)   Social Connection and Isolation Panel    Frequency of Communication with Friends and Family: Three times a week    Frequency of Social Gatherings with Friends and Family: Three times a week    Attends Religious Services: Patient declined    Active Member of Clubs or Organizations: No    Attends Banker Meetings: Not on file    Marital Status: Widowed  Intimate Partner Violence: Not At Risk (02/03/2022)   Humiliation, Afraid, Rape, and Kick questionnaire    Fear of Current or Ex-Partner: No    Emotionally  Abused: No    Physically Abused: No    Sexually Abused: No    Family History:  Family History  Problem Relation Age of Onset   Diabetes Father    Hypertension Father    Cancer Sister        unknown   Colon cancer Neg Hx    Esophageal cancer Neg Hx    Rectal cancer Neg Hx    Stomach cancer Neg Hx    Pancreatic cancer Neg Hx    Sleep apnea Neg Hx     Medications:   Current Outpatient Medications on File Prior to Visit  Medication Sig Dispense Refill   amLODipine  (NORVASC ) 5 MG tablet Take 1 tablet (5 mg total) by mouth daily. 90 tablet 3   Ascorbic Acid (VITAMIN C PO) Take by mouth.     aspirin  EC 81 MG tablet Take 81 mg by mouth daily.     B-D UF III MINI PEN NEEDLES 31G X 5 MM MISC Inject into the skin 3 (three) times daily.     B-D UF III MINI PEN NEEDLES 31G X 5 MM MISC CHECK THREE TIMES DAILY DX E11.9 300 each 1   benzonatate  (TESSALON ) 200 MG capsule Take 1 capsule (200 mg total) by mouth 2 (two) times daily as needed for cough. 20 capsule 0   budesonide-formoterol (SYMBICORT) 160-4.5 MCG/ACT inhaler Inhale 2 puffs into the lungs 2 (two) times daily as needed  (asthma). (Patient not taking: Reported on 10/30/2023)     busPIRone  (BUSPAR ) 5 MG tablet Take 1 tablet (5 mg total) by mouth 2 (two) times daily as needed. 30 tablet 1   Cholecalciferol  (VITAMIN D3) 25 MCG (1000 UT) CAPS Take 1 capsule (1,000 Units total) by mouth daily. Follow-up appt due w/ Dr. Purcell must see provider for future refills 90 capsule 0   cyclobenzaprine  (FLEXERIL ) 10 MG tablet Take 1 tablet by mouth three times daily as needed for muscle spasm 90 tablet 0   Dasiglucagon  HCl (ZEGALOGUE ) 0.6 MG/0.6ML SOSY Inject 0.6 mg into the skin as needed. 0.6 mL 2   DULoxetine  (CYMBALTA ) 60 MG capsule Take 1 capsule by mouth once daily 90 capsule 0   ergocalciferol (VITAMIN D2) 1.25 MG (50000 UT) capsule Take 50,000 Units by mouth every Wednesday.     famotidine  (PEPCID ) 40 MG tablet TAKE 1 TABLET BY MOUTH EVERYDAY AT BEDTIME 90 tablet 3   FARXIGA 10 MG TABS tablet Take 10 mg by mouth daily.     fenofibrate  160 MG tablet TAKE 1 TABLET BY MOUTH EVERY DAY 90 tablet 3   ferrous sulfate  325 (65 FE) MG tablet Take 325 mg by mouth daily with breakfast.     Finerenone  (KERENDIA ) 10 MG TABS Take 1 tablet (10 mg total) by mouth daily. 90 tablet 3   fluticasone  (FLONASE ) 50 MCG/ACT nasal spray Place 2 sprays into both nostrils daily. 16 g 6   furosemide  (LASIX ) 20 MG tablet TAKE 1 TABLET (20 MG TOTAL) BY MOUTH DAILY AS NEEDED FOR EDEMA. 90 tablet 3   glucose blood (ONETOUCH VERIO) test strip CHECK SUGARS 4 TIMES A DAY DX E11.9     insulin  aspart (NOVOLOG  FLEXPEN) 100 UNIT/ML FlexPen INJECT 3 TIMES A DAY PER SLIDING SCALE UP TO 30 UNITS A DAY DX E11.9 3 mL 5   insulin  degludec (TRESIBA  FLEXTOUCH) 100 UNIT/ML FlexTouch Pen Inject 26 Units into the skin daily. 3 mL 3   Insulin  Pen Needle (B-D UF III MINI PEN NEEDLES) 31G X  5 MM MISC CHECK THREE TIMES DAILY DX E11.9     Insulin  Pen Needle (B-D UF III MINI PEN NEEDLES) 31G X 5 MM MISC CHECK THREE TIMES DAILY DX E11.9     Insulin  Syringe-Needle U-100 31G X  5/16 0.3 ML MISC CHECK THREE TIMES DAILY DX E11.9     Lancets (ONETOUCH DELICA PLUS LANCET33G) MISC Apply topically.     levocetirizine (XYZAL ) 5 MG tablet TAKE 1 TABLET BY MOUTH EVERY DAY IN THE EVENING 90 tablet 3   metoCLOPramide  (REGLAN ) 10 MG tablet Take 1 tablet (10 mg total) by mouth every 6 (six) hours as needed. for nausea (Patient not taking: Reported on 10/30/2023) 60 tablet 0   metoprolol  tartrate (LOPRESSOR ) 25 MG tablet Take 1 tablet by mouth twice daily 132 tablet 0   nitroGLYCERIN  (NITROSTAT ) 0.4 MG SL tablet Place 1 tablet (0.4 mg total) under the tongue every 5 (five) minutes as needed for chest pain. 20 tablet 3   omeprazole  (PRILOSEC) 40 MG capsule Take 1 capsule (40 mg total) by mouth daily. 90 capsule 1   ONETOUCH VERIO test strip 4 (four) times daily.     pravastatin  (PRAVACHOL ) 40 MG tablet TAKE 1 TABLET BY MOUTH ONCE DAILY. PLEASE KEEP UPCOMING APPT IN JANUARY 2023 30 tablet 0   valsartan -hydrochlorothiazide  (DIOVAN -HCT) 160-12.5 MG tablet Take 1 tablet by mouth daily. 90 tablet 3   No current facility-administered medications on file prior to visit.    Allergies:   Allergies  Allergen Reactions   Metformin Diarrhea and Other (See Comments)    diarrhea   Robaxin [Methocarbamol] Itching and Rash      OBJECTIVE:  Physical Exam  There were no vitals filed for this visit.  There is no height or weight on file to calculate BMI. No results found.  General: well developed, well nourished, pleasant middle-aged male, seated, in no evident distress Head: head normocephalic and atraumatic.   Neck: supple with no carotid or supraclavicular bruits Cardiovascular: regular rate and rhythm, no murmurs Musculoskeletal: no deformity Skin:  no rash/petichiae Vascular:  Normal pulses all extremities   Neurologic Exam Mental Status: Awake and fully alert. Oriented to place and time. Recent mildly impaired and remote memory intact. Attention span, concentration and fund of  knowledge mostly appropriate. Mood and affect appropriate.  Cranial Nerves: Pupils equal, briskly reactive to light. Extraocular movements full without nystagmus. Visual fields full to confrontation. Hearing intact. Facial sensation intact. Face, tongue, palate moves normally and symmetrically.  Motor: Normal bulk and tone. Normal strength in all tested extremity muscles Gait and Station: Arises from chair without difficulty. Stance is normal. Gait demonstrates normal stride length and balance without use of AD. Tandem walk and heel toe without difficulty.           ASSESSMENT/PLAN: Joseph [name] is a 68 y.o. year old male    OSA on CPAP :  Has not used CPAP over the past month but month prior showed adequate usage and optimal residual AHI.  Unclear reason for not using over the past month, he is agreeable to restart. Sleep study showed severe sleep apnea with nocturnal hypoxemia, discussed importance of CPAP compliance compliance especially in patient with extensive cardiac history.   Continue current pressure settings of 8-16 with EPR 3.   Discussed importance of nightly CPAP compliance with ensuring greater than 4 hours per night for optimal benefit  DME [employer], continue to follow with DME company for any needed supplies or CPAP related concerns  CPAP set up 11/2022     Follow up in 6 months or call earlier if needed   CC:  PCP: Purcell Emil Schanz, MD    I personally spent a total of *** minutes in the care of the patient today including {Time Based Coding:[id number]}.   Harlene Bogaert, AGNP-BC  [facility] [address] [place], [place] [address]  Phone [phone] Fax [phone] Note: This document was prepared with digital dictation and possible smart phrase technology. Any transcriptional errors that result from this process are unintentional.

## 2023-11-14 ENCOUNTER — Ambulatory Visit: Admitting: Adult Health

## 2023-11-14 ENCOUNTER — Encounter: Payer: Self-pay | Admitting: Adult Health

## 2023-11-15 DIAGNOSIS — E1165 Type 2 diabetes mellitus with hyperglycemia: Secondary | ICD-10-CM | POA: Diagnosis not present

## 2023-11-15 DIAGNOSIS — E10319 Type 1 diabetes mellitus with unspecified diabetic retinopathy without macular edema: Secondary | ICD-10-CM | POA: Diagnosis not present

## 2023-11-15 DIAGNOSIS — G609 Hereditary and idiopathic neuropathy, unspecified: Secondary | ICD-10-CM | POA: Diagnosis not present

## 2023-11-15 DIAGNOSIS — E7800 Pure hypercholesterolemia, unspecified: Secondary | ICD-10-CM | POA: Diagnosis not present

## 2023-11-15 DIAGNOSIS — N182 Chronic kidney disease, stage 2 (mild): Secondary | ICD-10-CM | POA: Diagnosis not present

## 2023-11-15 DIAGNOSIS — I10 Essential (primary) hypertension: Secondary | ICD-10-CM | POA: Diagnosis not present

## 2023-11-27 ENCOUNTER — Other Ambulatory Visit: Payer: Self-pay | Admitting: Emergency Medicine

## 2023-11-30 ENCOUNTER — Encounter (INDEPENDENT_AMBULATORY_CARE_PROVIDER_SITE_OTHER): Admitting: Ophthalmology

## 2023-11-30 DIAGNOSIS — Z794 Long term (current) use of insulin: Secondary | ICD-10-CM

## 2023-11-30 DIAGNOSIS — E113513 Type 2 diabetes mellitus with proliferative diabetic retinopathy with macular edema, bilateral: Secondary | ICD-10-CM

## 2023-11-30 DIAGNOSIS — I10 Essential (primary) hypertension: Secondary | ICD-10-CM

## 2023-11-30 DIAGNOSIS — H35033 Hypertensive retinopathy, bilateral: Secondary | ICD-10-CM | POA: Diagnosis not present

## 2023-12-03 ENCOUNTER — Other Ambulatory Visit: Payer: Self-pay | Admitting: Emergency Medicine

## 2023-12-05 ENCOUNTER — Other Ambulatory Visit: Payer: Self-pay | Admitting: Internal Medicine

## 2023-12-06 NOTE — Telephone Encounter (Signed)
Needs office visit for refill.

## 2023-12-06 NOTE — Telephone Encounter (Signed)
 Please advise Sir, thank you.

## 2023-12-07 ENCOUNTER — Ambulatory Visit: Admitting: Allergy & Immunology

## 2023-12-12 ENCOUNTER — Other Ambulatory Visit: Payer: Self-pay | Admitting: Emergency Medicine

## 2023-12-12 ENCOUNTER — Other Ambulatory Visit: Payer: Self-pay | Admitting: Internal Medicine

## 2023-12-12 NOTE — Telephone Encounter (Signed)
 Please advise if it is ok to refill this.Thank you

## 2023-12-12 NOTE — Telephone Encounter (Signed)
 Declined the other day patient needs office visit before he gets refills

## 2023-12-19 ENCOUNTER — Other Ambulatory Visit: Payer: Self-pay | Admitting: Internal Medicine

## 2023-12-19 ENCOUNTER — Telehealth: Payer: Self-pay | Admitting: Internal Medicine

## 2023-12-19 MED ORDER — PRAVASTATIN SODIUM 40 MG PO TABS: 40.0000 mg | ORAL_TABLET | Freq: Every day | ORAL | 1 refills | Status: DC

## 2023-12-19 NOTE — Telephone Encounter (Signed)
*  STAT* If patient is at the pharmacy, call can be transferred to refill team.   1. Which medications need to be refilled? (please list name of each medication and dose if known) pravastatin  (PRAVACHOL ) 40 MG tablet    2. Would you like to learn more about the convenience, safety, & potential cost savings by using the [facility]?    3. Are you open to using the [facility] (Type [facility]. ).   4. Which pharmacy/location (including street and city if local pharmacy) is medication to be sent to?  [id number] - [place], [place] - [address]     5. Do they need a 30 day or 90 day supply? 90 day

## 2023-12-19 NOTE — Telephone Encounter (Signed)
 Pt's medication was sent to pt's pharmacy as requested. Confirmation received.

## 2023-12-28 ENCOUNTER — Encounter: Payer: Self-pay | Admitting: Gastroenterology

## 2023-12-28 ENCOUNTER — Ambulatory Visit (INDEPENDENT_AMBULATORY_CARE_PROVIDER_SITE_OTHER): Admitting: Gastroenterology

## 2023-12-28 VITALS — BP 132/80 | HR 54 | Ht 66.0 in | Wt 191.4 lb

## 2023-12-28 DIAGNOSIS — K317 Polyp of stomach and duodenum: Secondary | ICD-10-CM

## 2023-12-28 DIAGNOSIS — K219 Gastro-esophageal reflux disease without esophagitis: Secondary | ICD-10-CM | POA: Diagnosis not present

## 2023-12-28 DIAGNOSIS — K5904 Chronic idiopathic constipation: Secondary | ICD-10-CM | POA: Diagnosis not present

## 2023-12-28 DIAGNOSIS — R109 Unspecified abdominal pain: Secondary | ICD-10-CM | POA: Diagnosis not present

## 2023-12-28 DIAGNOSIS — G8929 Other chronic pain: Secondary | ICD-10-CM | POA: Insufficient documentation

## 2023-12-28 DIAGNOSIS — K3184 Gastroparesis: Secondary | ICD-10-CM

## 2023-12-28 MED ORDER — CYCLOBENZAPRINE HCL 10 MG PO TABS: 10.0000 mg | ORAL_TABLET | Freq: Three times a day (TID) | ORAL | 3 refills | Status: AC | PRN

## 2023-12-28 NOTE — Progress Notes (Signed)
 12/28/2023 Joseph [name] [id number] 1955-04-30   HISTORY OF PRESENT ILLNESS: This is a 68 year old male who is a patient of Dr. Darilyn.  He follows here for his gastroparesis, GERD, chronic abdominal pain, gastric polyps, chronic constipation.  For his constipation he tells me that he is just taking stool softeners right now and that works for him.  In regards to his upper GI symptoms he is on omeprazole  40 mg daily and famotidine  40 mg at bedtime.  He also takes cyclobenzaprine  for his abdominal pain/gastroparesis.  It is prescribed 3 times daily as needed, but he takes it regularly 3 times a day.  He also has metoclopramide , but he says that he does not really ever need to take that.  He needs refills on his cyclobenzaprine .  His daughter is with him today and says that as long as he takes that he does not have any GI complaints at all.  He has been out of it for the past 3 weeks and has been complaining about abdominal pain, feeling full, etc.  She says that he has tried to cut back on it and take it less, but then symptoms return.  EGD 01/2023:  - Multiple gastric polyps. Biopsied. Banded. The 5 largest polyps were biopsied and banded to ablate. largest was about 8 mm and was semi- pedunculated, remainder were sessile. estimate ten 1- 3 mm sessile polyps remain. - Erythematous mucosa in the antrum. - The examination was otherwise normal.  A. STOMACH, BODY, POLYPECTOMY:  Fragments of hyperplastic gastric polyp with inflammation and focal  erosion.  Negative for dysplasia or malignancy.   Repeat EGD 01/2025.  Colonoscopy is UTD.  Past Medical History:  Diagnosis Date   Anemia    Anxiety    Arthritis    BPH (benign prostatic hypertrophy)    Cataract    Chronic renal insufficiency, stage III (moderate)    [place]   Coronary artery disease    Dr Mona    Depression    Diabetes mellitus without complication (HCC) 1988   insulin  dependent   Diabetic autonomic  neuropathy (HCC) 03/19/2019   Gastric polyps - hyperplastic 03/19/2019   Gastroparesis due to DM (HCC)    GERD (gastroesophageal reflux disease)    Hx of adenomatous colonic polyps 06/11/2014   Hyperlipidemia    Hypertension    Myocardial infarction [facility])    Neuropathy    PVD (peripheral vascular disease)    Sleep apnea 2010   refuses to use cpap   Past Surgical History:  Procedure Laterality Date   ANKLE FRACTURE SURGERY Right    BIOPSY  02/08/2023   Procedure: BIOPSY;  Surgeon: Avram Lupita BRAVO, MD;  Location: [facility];  Service: Gastroenterology;;   CARDIAC CATHETERIZATION  01/30/2012   This demonstrated proximal to mid LAD disease and disease of 2 large OM vessels concerning for surgical disease.   COLONOSCOPY     CORONARY ARTERY BYPASS GRAFT  02/06/2012   Procedure: CORONARY ARTERY BYPASS GRAFTING (CABG); 5- Vessel bypass a LIMA to the LAD, a saphenous vein to the to the diagonal , a saphenous vein to the distal OM, and sequential reverse saphenous vien graft to the posterior descending and 2nd OM. Surgeon: Dallas KATHEE Jude, MD;  Location: [facility] OR;  Service: Open Heart Surgery;  Laterality: [name];   ESOPHAGOGASTRODUODENOSCOPY  2005   ESOPHAGOGASTRODUODENOSCOPY [name] 02/08/2023   Procedure: ESOPHAGOGASTRODUODENOSCOPY (EGD);  Surgeon: Avram Lupita BRAVO, MD;  Location: [facility];  Service: Gastroenterology;  Laterality:  [name];   GASTRIC VARICES BANDING  02/08/2023   Procedure: GASTRIC POLYPS BANDING;  Surgeon: Avram Lupita BRAVO, MD;  Location: [facility];  Service: Gastroenterology;;   LEFT HEART CATHETERIZATION WITH CORONARY ANGIOGRAM [name] 01/30/2012   Procedure: LEFT HEART CATHETERIZATION WITH CORONARY ANGIOGRAM;  Surgeon: Vinie KYM Maxcy, MD;  Location: [facility] CATH LAB;  Service: Cardiovascular;  Laterality: [name];   teeth removal  08/16/2021   TOOTH EXTRACTION [name] 09/16/2021   Procedure: DENTAL RESTORATION/EXTRACTIONS;  Surgeon: Sheryle Hamilton, DMD;  Location: [facility] OR;  Service: Oral  Surgery;  Laterality: [name];   UPPER GASTROINTESTINAL ENDOSCOPY      reports that he has never smoked. He has never used smokeless tobacco. He reports that he does not drink alcohol and does not use drugs. family history includes Cancer in his sister; Diabetes in his father; Hypertension in his father. Allergies  Allergen Reactions   Metformin Diarrhea and Other (See Comments)    diarrhea   Robaxin [Methocarbamol] Itching and Rash      Outpatient Encounter Medications as of 12/28/2023  Medication Sig   amLODipine  (NORVASC ) 5 MG tablet Take 1 tablet (5 mg total) by mouth daily.   Ascorbic Acid (VITAMIN C PO) Take by mouth.   aspirin  EC 81 MG tablet Take 81 mg by mouth daily.   B-D UF III MINI PEN NEEDLES 31G X 5 MM MISC Inject into the skin 3 (three) times daily.   B-D UF III MINI PEN NEEDLES 31G X 5 MM MISC CHECK THREE TIMES DAILY DX E11.9   busPIRone  (BUSPAR ) 5 MG tablet Take 1 tablet (5 mg total) by mouth 2 (two) times daily as needed.   Cholecalciferol  (VITAMIN D3) 25 MCG (1000 UT) CAPS Take 1 capsule (1,000 Units total) by mouth daily. Follow-up appt due w/ Dr. Purcell must see provider for future refills   cyclobenzaprine  (FLEXERIL ) 10 MG tablet Take 1 tablet by mouth three times daily as needed for muscle spasm   DULoxetine  (CYMBALTA ) 60 MG capsule Take 1 capsule by mouth once daily   ergocalciferol (VITAMIN D2) 1.25 MG (50000 UT) capsule Take 50,000 Units by mouth every Wednesday.   famotidine  (PEPCID ) 40 MG tablet TAKE 1 TABLET BY MOUTH EVERYDAY AT BEDTIME   FARXIGA 10 MG TABS tablet Take 10 mg by mouth daily.   fenofibrate  160 MG tablet TAKE 1 TABLET BY MOUTH EVERY DAY   ferrous sulfate  325 (65 FE) MG tablet Take 325 mg by mouth daily with breakfast.   Finerenone  (KERENDIA ) 10 MG TABS Take 1 tablet (10 mg total) by mouth daily.   fluticasone  (FLONASE ) 50 MCG/ACT nasal spray Place 2 sprays into both nostrils daily.   furosemide  (LASIX ) 20 MG tablet TAKE 1 TABLET (20 MG TOTAL) BY  MOUTH DAILY AS NEEDED FOR EDEMA.   glucose blood (ONETOUCH VERIO) test strip CHECK SUGARS 4 TIMES A DAY DX E11.9   insulin  aspart (NOVOLOG  FLEXPEN) 100 UNIT/ML FlexPen INJECT 3 TIMES A DAY PER SLIDING SCALE UP TO 30 UNITS A DAY DX E11.9   insulin  degludec (TRESIBA  FLEXTOUCH) 100 UNIT/ML FlexTouch Pen Inject 26 Units into the skin daily.   Insulin  Pen Needle (B-D UF III MINI PEN NEEDLES) 31G X 5 MM MISC CHECK THREE TIMES DAILY DX E11.9   Insulin  Pen Needle (B-D UF III MINI PEN NEEDLES) 31G X 5 MM MISC CHECK THREE TIMES DAILY DX E11.9   Insulin  Syringe-Needle U-100 31G X 5/16 0.3 ML MISC CHECK THREE TIMES DAILY DX E11.9   Lancets [facility]  PLUS LANCET33G) MISC Apply topically.   levocetirizine (XYZAL ) 5 MG tablet TAKE 1 TABLET BY MOUTH EVERY DAY IN THE EVENING   metoCLOPramide  (REGLAN ) 10 MG tablet Take 1 tablet (10 mg total) by mouth every 6 (six) hours as needed. for nausea   metoprolol  tartrate (LOPRESSOR ) 25 MG tablet Take 1 tablet by mouth twice daily   nitroGLYCERIN  (NITROSTAT ) 0.4 MG SL tablet Place 1 tablet (0.4 mg total) under the tongue every 5 (five) minutes as needed for chest pain.   omeprazole  (PRILOSEC) 40 MG capsule Take 1 capsule by mouth once daily   ONETOUCH VERIO test strip 4 (four) times daily.   pravastatin  (PRAVACHOL ) 40 MG tablet Take 1 tablet (40 mg total) by mouth daily.   valsartan -hydrochlorothiazide  (DIOVAN -HCT) 160-12.5 MG tablet Take 1 tablet by mouth daily.   benzonatate  (TESSALON ) 200 MG capsule Take 1 capsule (200 mg total) by mouth 2 (two) times daily as needed for cough. (Patient not taking: Reported on 12/28/2023)   budesonide-formoterol (SYMBICORT) 160-4.5 MCG/ACT inhaler Inhale 2 puffs into the lungs 2 (two) times daily as needed (asthma). (Patient not taking: Reported on 12/28/2023)   Dasiglucagon  HCl (ZEGALOGUE ) 0.6 MG/0.6ML SOSY Inject 0.6 mg into the skin as needed. (Patient not taking: Reported on 12/28/2023)   No facility-administered encounter  medications on file as of 12/28/2023.    REVIEW OF SYSTEMS  : All other systems reviewed and negative except where noted in the History of Present Illness.   PHYSICAL EXAM: BP 132/80   Pulse (!) 54   Ht 5' 6 (1.676 m)   Wt 191 lb 6 oz (86.8 kg)   BMI 30.89 kg/m  General: Well developed male in no acute distress Head: Normocephalic and atraumatic Eyes:  Sclerae anicteric, conjunctiva pink. Ears: Normal auditory acuity Lungs: Clear throughout to auscultation; no W/R/R. Heart: Slightly bradycardic but regular rhythm; no M/R/G. Abdomen: Soft, non-distended.  BS present.  Non-tender. Musculoskeletal: Symmetrical with no gross deformities  Skin: No lesions on visible extremities Neurological: Alert oriented x 4, grossly non-focal Psychological:  Alert and cooperative. Normal mood and affect  ASSESSMENT AND PLAN:  Gastroparesis Yes   Gastric polyps     Chronic abdominal pain     Gastroesophageal reflux disease without esophagitis     Chronic constipation      *Needs refill on his Flexeril /cyclobenzaprine .  He uses that regularly, usually 3 times a day and as long as he takes it he has no GI symptoms or complaints.  Never really needd to take the Reglan .  Will refill his medication. *Will be due for repeat EGD next year and that recall is in.  CC:  Sagardia, Hettinger, *

## 2023-12-28 NOTE — Patient Instructions (Addendum)
 We have sent the following medications to your pharmacy for you to pick up at your convenience: Flexeril  10 mg three times daily as needed.   Follow up in 1 year or sooner if needed.  _______________________________________________________  If your blood pressure at your visit was 140/90 or greater, please contact your primary care physician to follow up on this.  _______________________________________________________  If you are age 68 or older, your body mass index should be between 23-30. Your Body mass index is 30.89 kg/m. If this is out of the aforementioned range listed, please consider follow up with your Primary Care Provider.  If you are age 71 or younger, your body mass index should be between 19-25. Your Body mass index is 30.89 kg/m. If this is out of the aformentioned range listed, please consider follow up with your Primary Care Provider.   ________________________________________________________  The [facility] providers would like to encourage you to use MYCHART to communicate with providers for non-urgent requests or questions.  Due to long hold times on the telephone, sending your provider a message by [facility] may be a faster and more efficient way to get a response.  Please allow 48 business hours for a response.  Please remember that this is for non-urgent requests.  _______________________________________________________  [facility] is using a team-based approach to care.  Your team is made up of your doctor and two to three APPS. Our APPS (Nurse Practitioners and Physician Assistants) work with your physician to ensure care continuity for you. They are fully qualified to address your health concerns and develop a treatment plan. They communicate directly with your gastroenterologist to care for you. Seeing the Advanced Practice Practitioners on your physician's team can help you by facilitating care more promptly, often allowing for earlier appointments,  access to diagnostic testing, procedures, and other specialty referrals.

## 2024-01-02 ENCOUNTER — Ambulatory Visit: Admitting: Allergy & Immunology

## 2024-01-02 ENCOUNTER — Encounter: Payer: Self-pay | Admitting: Allergy & Immunology

## 2024-01-02 ENCOUNTER — Other Ambulatory Visit: Payer: Self-pay

## 2024-01-02 VITALS — BP 130/70 | HR 61 | Temp 97.7°F | Resp 18 | Ht 66.0 in | Wt 193.6 lb

## 2024-01-02 DIAGNOSIS — J4540 Moderate persistent asthma, uncomplicated: Secondary | ICD-10-CM | POA: Diagnosis not present

## 2024-01-02 DIAGNOSIS — J310 Chronic rhinitis: Secondary | ICD-10-CM | POA: Diagnosis not present

## 2024-01-02 MED ORDER — TRELEGY ELLIPTA 200-62.5-25 MCG/ACT IN AEPB: 1.0000 | INHALATION_SPRAY | Freq: Every day | RESPIRATORY_TRACT | 5 refills | Status: AC

## 2024-01-02 MED ORDER — ALBUTEROL SULFATE HFA 108 (90 BASE) MCG/ACT IN AERS: 2.0000 | INHALATION_SPRAY | RESPIRATORY_TRACT | 1 refills | Status: AC | PRN

## 2024-01-02 NOTE — Progress Notes (Addendum)
 NEW PATIENT  Date of Service/Encounter:  01/02/24  Consult requested by: Purcell Emil Schanz, MD   Assessment:   Moderate persistent asthma, uncomplicated  Chronic rhinitis - planning for testing at the next visit  Failed Symbicort   Plan/Recommendations:   1. Moderate persistent asthma, uncomplicated - Lung testing looks decent, but I would like you to take something EVERY DAY to keep your breathing under better control. - To make things easier, we are going to start Trelegy one puff once daily (CONTAINS THREE MEDICATIONS to help with breathing). - Daily controller medication(s): Trelegy 200/62.5/25mcg one puff once daily - Prior to physical activity: albuterol  2 puffs 10-15 minutes before physical activity. - Rescue medications: albuterol  4 puffs every 4-6 hours as needed - Asthma control goals:  * Full participation in all desired activities (may need albuterol  before activity) * Albuterol  use two time or less a week on average (not counting use with activity) * Cough interfering with sleep two time or less a month * Oral steroids no more than once a year * No hospitalizations  2. Chronic rhinitis - Because of insurance stipulations, we cannot do skin testing on the same day as your first visit. - We are all working to fight this, but for now we need to do two separate visits.  - We will know more after we do testing at the next visit.  - The skin testing visit can be squeezed in at your convenience.  - Then we can make a more full plan to address all of his symptoms. - Be sure to stop your antihistamines for 3 days before this appointment.   3. Return in about 2 weeks (around 01/16/2024) for SKIN TESTING (1-55). You can have the follow up appointment with Dr. Iva or a Nurse Practicioner (our Nurse Practitioners are excellent and always have Physician oversight!).   This note in its entirety was forwarded to the Provider who requested this  consultation.  Subjective:   Joseph [name] is a 68 y.o. male presenting today for evaluation of  Chief Complaint  Patient presents with   Establish Care    Pt presents to the office with daughter. Daughter states that his eyes water, nose always running. Takes Xzyal once a day currently. Also sees a specialist for his eyes, gets injections.  Also was prescribed symbicort, but haven't used that in a while.    Quillian LILLETTE Loges has a history of the following: Patient Active Problem List   Diagnosis Date Noted   Chronic abdominal pain 12/28/2023   Stage 3a chronic kidney disease (HCC) 08/16/2022   Sleep apnea 04/20/2022   Situational mixed anxiety and depressive disorder 03/14/2022   Dyslipidemia associated with type 2 diabetes mellitus (HCC) 08/03/2021   Depression 02/01/2021   Gastro-esophageal reflux disease with esophagitis 02/01/2021   Gastric polyps - hyperplastic 03/19/2019   Diabetic autonomic neuropathy (HCC) 03/19/2019   Benign prostatic hyperplasia 01/16/2019   Chronic low back pain 01/16/2019   Iron deficiency anemia 01/16/2019   Dyslipidemia 03/10/2016   Atherosclerotic heart disease of native coronary artery without angina pectoris 05/03/2015   Diabetic peripheral neuropathy associated with type 2 diabetes mellitus (HCC) 05/03/2015   Enlarged prostate without lower urinary tract symptoms (luts) 04/30/2015   Major depressive disorder, recurrent, mild 04/30/2015   Peripheral vascular disease 04/30/2015   Hx of adenomatous colonic polyps 06/11/2014   Hyperlipidemia 02/25/2014   Gastroparesis 02/17/2014   Neuropathy 12/03/2013   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   Discussed the use of AI scribe software for clinical note transcription with the patient and/or guardian, who gave verbal consent to proceed.  Quillian LILLETTE Loges was referred by Purcell Emil Schanz, MD.     Joseph [name] is a 68 y.o. male presenting for an evaluation of asthma and allergies.  Asthma/Respiratory Symptom History: The asthma is a relatively new diagnosis for hi. He did have some undiagnosed breathing problems with breathing when he was growing up. He has nighttime coughing fairly frequently. He has a history of asthma diagnosed a few years ago and was previously on Symbicort, which was discontinued due to ineffectiveness. He experiences persistent respiratory symptoms, including rhinorrhea and episodes of ocular watering and burning. These symptoms occur year-round and are not influenced by seasonal changes or exposure to animals. He experiences nocturnal coughing, although the frequency is not specified. No history of childhood asthma, but he had breathing problems as a child.  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 He thinks that this might be contributing to his current symptoms, although symptoms have worsened since he retired.   Food Allergy Symptom History: He has tried multiple allergy medications, including Adderall, Zyrtec, and various nasal sprays, without relief. He has not undergone allergy testing in the past. He denies any food allergies but has medication allergies. He also reports being allergic to poison ivy, which causes significant reactions. He is interested in getting allergy testing performed. He does not tend to get infections often, including sinus and ear infections.   Otherwise, there is no history of other atopic diseases, including drug allergies, stinging insect allergies, or contact dermatitis. There is no significant infectious history. Vaccinations are up to date.     Past Medical History: Patient Active Problem List   Diagnosis Date Noted   Chronic abdominal pain 12/28/2023   Stage 3a chronic kidney disease (HCC) 08/16/2022   Sleep apnea 04/20/2022   Situational mixed anxiety and depressive disorder 03/14/2022   Dyslipidemia associated with type 2 diabetes mellitus (HCC) 08/03/2021   Depression 02/01/2021   Gastro-esophageal reflux disease with esophagitis 02/01/2021   Gastric polyps - hyperplastic 03/19/2019   Diabetic autonomic neuropathy (HCC) 03/19/2019   Benign prostatic hyperplasia 01/16/2019   Chronic low back pain 01/16/2019   Iron deficiency anemia 01/16/2019   Dyslipidemia 03/10/2016   Atherosclerotic heart disease of native coronary artery without angina pectoris 05/03/2015   Diabetic peripheral neuropathy associated with type 2 diabetes mellitus (HCC) 05/03/2015   Enlarged prostate without lower urinary tract symptoms (luts) 04/30/2015   Major depressive disorder, recurrent, mild 04/30/2015   Peripheral vascular disease 04/30/2015   Hx of adenomatous colonic polyps 06/11/2014   Hyperlipidemia 02/25/2014   Gastroparesis 02/17/2014   Neuropathy 12/03/2013   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   Presence of aortocoronary bypass graft 02/11/2012   Coronary arteriography abnormal 02/06/2012   Type 1 diabetes mellitus with complication (HCC) 07/27/2007   Hypertension associated with diabetes (HCC) 07/27/2007   GERD 07/27/2007   ARTHRITIS 07/27/2007   NEPHROLITHIASIS, HX OF 07/27/2007    Medication List:  Allergies as of 01/02/2024       Reactions  Metformin Diarrhea, Other (See Comments)   diarrhea   Robaxin [methocarbamol] Itching, Rash        Medication List        Accurate as of January 02, 2024 12:26 PM. If you have any questions, ask your nurse or doctor.          STOP taking these medications    benzonatate  200 MG capsule Commonly known as: TESSALON  Stopped by: Stevens Magwood Louis Shayan Bramhall   budesonide-formoterol 160-4.5  MCG/ACT inhaler Commonly known as: SYMBICORT Stopped by: Massiah Minjares Louis Dariel Betzer   Zegalogue  0.6 MG/0.6ML Sosy Generic drug: Dasiglucagon  HCl Stopped by: Marty Morton Shaggy       TAKE these medications    albuterol  108 (90 Base) MCG/ACT inhaler Commonly known as: Ventolin  HFA Inhale 2 puffs into the lungs every 4 (four) hours as needed for wheezing or shortness of breath. Started by: Marty Morton Shaggy   amLODipine  5 MG tablet Commonly known as: NORVASC  Take 1 tablet (5 mg total) by mouth daily.   aspirin  EC 81 MG tablet Take 81 mg by mouth daily.   B-D UF III MINI PEN NEEDLES 31G X 5 MM Misc Generic drug: Insulin  Pen Needle CHECK THREE TIMES DAILY DX E11.9   B-D UF III MINI PEN NEEDLES 31G X 5 MM Misc Generic drug: Insulin  Pen Needle CHECK THREE TIMES DAILY DX E11.9   B-D UF III MINI PEN NEEDLES 31G X 5 MM Misc Generic drug: Insulin  Pen Needle Inject into the skin 3 (three) times daily.   B-D UF III MINI PEN NEEDLES 31G X 5 MM Misc Generic drug: Insulin  Pen Needle CHECK THREE TIMES DAILY DX E11.9   busPIRone  5 MG tablet Commonly known as: BUSPAR  Take 1 tablet (5 mg total) by mouth 2 (two) times daily as needed.   cyclobenzaprine  10 MG tablet Commonly known as: FLEXERIL  Take 1 tablet (10 mg total) by mouth 3 (three) times daily as needed. for muscle spams   DULoxetine  60 MG capsule Commonly known as: CYMBALTA  Take 1 capsule by mouth once daily   ergocalciferol 1.25 MG (50000 UT) capsule Commonly known as: VITAMIN D2 Take 50,000 Units by mouth every Wednesday.   famotidine  40 MG tablet Commonly known as: PEPCID  TAKE 1 TABLET BY MOUTH EVERYDAY AT BEDTIME   Farxiga 10 MG Tabs tablet Generic drug: dapagliflozin propanediol Take 10 mg by mouth daily.   fenofibrate  160 MG tablet TAKE 1 TABLET BY MOUTH EVERY DAY   ferrous sulfate  325 (65 FE) MG tablet Take 325 mg by mouth daily with breakfast.   fluticasone  50 MCG/ACT nasal spray Commonly known as:  FLONASE  Place 2 sprays into both nostrils daily.   furosemide  20 MG tablet Commonly known as: LASIX  TAKE 1 TABLET (20 MG TOTAL) BY MOUTH DAILY AS NEEDED FOR EDEMA.   Insulin  Syringe-Needle U-100 31G X 5/16 0.3 ML Misc CHECK THREE TIMES DAILY DX E11.9   Kerendia  10 MG Tabs Generic drug: Finerenone  Take 1 tablet (10 mg total) by mouth daily.   levocetirizine 5 MG tablet Commonly known as: XYZAL  TAKE 1 TABLET BY MOUTH EVERY DAY IN THE EVENING   metoCLOPramide  10 MG tablet Commonly known as: REGLAN  Take 1 tablet (10 mg total) by mouth every 6 (six) hours as needed. for nausea   metoprolol  tartrate 25 MG tablet Commonly known as: LOPRESSOR  Take 1 tablet by mouth twice daily   nitroGLYCERIN  0.4 MG SL tablet Commonly known as: NITROSTAT  Place 1 tablet (0.4 mg total) under the tongue every 5 (  five) minutes as needed for chest pain.   NovoLOG  FlexPen 100 UNIT/ML FlexPen Generic drug: insulin  aspart INJECT 3 TIMES A DAY PER SLIDING SCALE UP TO 30 UNITS A DAY DX E11.9   omeprazole  40 MG capsule Commonly known as: PRILOSEC Take 1 capsule by mouth once daily   OneTouch Delica Plus Lancet33G Misc Apply topically.   OneTouch Verio test strip Generic drug: glucose blood CHECK SUGARS 4 TIMES A DAY DX E11.9   OneTouch Verio test strip Generic drug: glucose blood 4 (four) times daily.   pravastatin  40 MG tablet Commonly known as: PRAVACHOL  Take 1 tablet (40 mg total) by mouth daily.   Trelegy Ellipta 200-62.5-25 MCG/ACT Aepb Generic drug: Fluticasone -Umeclidin-Vilant Inhale 1 puff into the lungs daily. Started by: Marty Morton Shaggy   Tresiba  FlexTouch 100 UNIT/ML FlexTouch Pen Generic drug: insulin  degludec Inject 26 Units into the skin daily.   valsartan -hydrochlorothiazide  160-12.5 MG tablet Commonly known as: DIOVAN -HCT Take 1 tablet by mouth daily.   VITAMIN C PO Take by mouth.   Vitamin D3 25 MCG (1000 UT) Caps Take 1 capsule (1,000 Units total) by mouth  daily. Follow-up appt due w/ Dr. Purcell must see provider for future refills        Birth History: non-contributory  Developmental History: non-contributory  Past Surgical History: Past Surgical History:  Procedure Laterality Date   ANKLE FRACTURE SURGERY Right    BIOPSY  02/08/2023   Procedure: BIOPSY;  Surgeon: Avram Lupita BRAVO, MD;  Location: [facility];  Service: Gastroenterology;;   CARDIAC CATHETERIZATION  01/30/2012   This demonstrated proximal to mid LAD disease and disease of 2 large OM vessels concerning for surgical disease.   COLONOSCOPY     CORONARY ARTERY BYPASS GRAFT  02/06/2012   Procedure: CORONARY ARTERY BYPASS GRAFTING (CABG); 5- Vessel bypass a LIMA to the LAD, a saphenous vein to the to the diagonal , a saphenous vein to the distal OM, and sequential reverse saphenous vien graft to the posterior descending and 2nd OM. Surgeon: Dallas KATHEE Jude, MD;  Location: [facility] OR;  Service: Open Heart Surgery;  Laterality: [name];   ESOPHAGOGASTRODUODENOSCOPY  2005   ESOPHAGOGASTRODUODENOSCOPY [name] 02/08/2023   Procedure: ESOPHAGOGASTRODUODENOSCOPY (EGD);  Surgeon: Avram Lupita BRAVO, MD;  Location: [facility];  Service: Gastroenterology;  Laterality: [name];   GASTRIC VARICES BANDING  02/08/2023   Procedure: GASTRIC POLYPS BANDING;  Surgeon: Avram Lupita BRAVO, MD;  Location: [facility];  Service: Gastroenterology;;   LEFT HEART CATHETERIZATION WITH CORONARY ANGIOGRAM [name] 01/30/2012   Procedure: LEFT HEART CATHETERIZATION WITH CORONARY ANGIOGRAM;  Surgeon: Vinie KYM Maxcy, MD;  Location: [facility] CATH LAB;  Service: Cardiovascular;  Laterality: [name];   teeth removal  08/16/2021   TOOTH EXTRACTION [name] 09/16/2021   Procedure: DENTAL RESTORATION/EXTRACTIONS;  Surgeon: Sheryle Hamilton, DMD;  Location: [facility] OR;  Service: Oral Surgery;  Laterality: [name];   UPPER GASTROINTESTINAL ENDOSCOPY       Family History: Family History  Problem Relation Age of Onset   Diabetes Father    Hypertension  Father    Cancer Sister        unknown   Colon cancer Neg Hx    Esophageal cancer Neg Hx    Rectal cancer Neg Hx    Stomach cancer Neg Hx    Pancreatic cancer Neg Hx    Sleep apnea Neg Hx      Social History: Deondra lives at home with his family. They live in a house. There is wood throughout the home. They have  electric heating and central cooling. There are no animals inside or outside of the home. There are roaches in the home. There are no dust mite coverings on the bedding. There is no tobacco exposures in the home.    Review of systems otherwise negative other than that mentioned in the HPI.    Objective:   Blood pressure 130/70, pulse 61, temperature 97.7 F (36.5 C), temperature source Temporal, resp. rate 18, height 5' 6 (1.676 m), weight 193 lb 9.6 oz (87.8 kg), SpO2 95%. Body mass index is 31.25 kg/m.     Physical Exam Vitals reviewed.  Constitutional:      Appearance: He is well-developed.     Comments: Rather reserved.   HENT:     Head: Normocephalic and atraumatic.     Right Ear: Tympanic membrane, ear canal and external ear normal. No drainage, swelling or tenderness. Tympanic membrane is not injected, scarred, erythematous, retracted or bulging.     Left Ear: Tympanic membrane, ear canal and external ear normal. No drainage, swelling or tenderness. Tympanic membrane is not injected, scarred, erythematous, retracted or bulging.     Nose: No nasal deformity, septal deviation, mucosal edema or rhinorrhea.     Right Turbinates: Enlarged, swollen and pale.     Left Turbinates: Enlarged, swollen and pale.     Right Sinus: No maxillary sinus tenderness or frontal sinus tenderness.     Left Sinus: No maxillary sinus tenderness or frontal sinus tenderness.     Mouth/Throat:     Mouth: Mucous membranes are not pale and not dry.     Pharynx: Uvula midline.  Eyes:     General:        Right eye: No discharge.        Left eye: No discharge.     Conjunctiva/sclera:  Conjunctivae normal.     Right eye: Right conjunctiva is not injected. No chemosis.    Left eye: Left conjunctiva is not injected. No chemosis.    Pupils: Pupils are equal, round, and reactive to light.  Cardiovascular:     Rate and Rhythm: Normal rate and regular rhythm.     Heart sounds: Normal heart sounds.  Pulmonary:     Effort: Pulmonary effort is normal. No tachypnea, accessory muscle usage or respiratory distress.     Breath sounds: Normal breath sounds. No wheezing, rhonchi or rales.  Chest:     Chest wall: No tenderness.  Abdominal:     Tenderness: There is no abdominal tenderness. There is no guarding or rebound.  Lymphadenopathy:     Head:     Right side of head: No submandibular, tonsillar or occipital adenopathy.     Left side of head: No submandibular, tonsillar or occipital adenopathy.     Cervical: No cervical adenopathy.  Skin:    General: Skin is warm.     Capillary Refill: Capillary refill takes less than 2 seconds.     Coloration: Skin is not pale.     Findings: No abrasion, erythema, petechiae or rash. Rash is not papular, urticarial or vesicular.  Neurological:     Mental Status: He is alert.  Psychiatric:        Behavior: Behavior is cooperative.      Diagnostic studies:    Spirometry: results normal (FEV1: 2.07/78%, FVC: 2.73/80%, FEV1/FVC: 76%).    Spirometry consistent with normal pattern. Expiratory loop is a bit flattened overall, but number wise his spirometry looked excellent.   Allergy Studies: none  Marty Shaggy, MD [facility] 

## 2024-01-02 NOTE — Patient Instructions (Addendum)
 1. Moderate persistent asthma, uncomplicated - Lung testing looks decent, but I would like you to take something EVERY DAY to keep your breathing under better control. - To make things easier, we are going to start Trelegy one puff once daily (CONTAINS THREE MEDICATIONS to help with breathing). - Daily controller medication(s): Trelegy 200/62.5/25mcg one puff once daily - Prior to physical activity: albuterol  2 puffs 10-15 minutes before physical activity. - Rescue medications: albuterol  4 puffs every 4-6 hours as needed - Asthma control goals:  * Full participation in all desired activities (may need albuterol  before activity) * Albuterol  use two time or less a week on average (not counting use with activity) * Cough interfering with sleep two time or less a month * Oral steroids no more than once a year * No hospitalizations  2. Chronic rhinitis - Because of insurance stipulations, we cannot do skin testing on the same day as your first visit. - We are all working to fight this, but for now we need to do two separate visits.  - We will know more after we do testing at the next visit.  - The skin testing visit can be squeezed in at your convenience.  - Then we can make a more full plan to address all of his symptoms. - Be sure to stop your antihistamines for 3 days before this appointment.   3. Return in about 2 weeks (around 01/16/2024) for SKIN TESTING (1-55). You can have the follow up appointment with Dr. Iva or a Nurse Practicioner (our Nurse Practitioners are excellent and always have Physician oversight!).    Please inform us  of any Emergency Department visits, hospitalizations, or changes in symptoms. Call us  before going to the ED for breathing or allergy symptoms since we might be able to fit you in for a sick visit. Feel free to contact us  anytime with any questions, problems, or concerns.  It was a pleasure to meet you and your family today!  Websites that have reliable  patient information: 1. American Academy of Asthma, Allergy, and Immunology: www.aaaai.org 2. Food Allergy Research and Education (FARE): foodallergy.org 3. Mothers of Asthmatics: http://www.asthmacommunitynetwork.org 4. American College of Allergy, Asthma, and Immunology: www.acaai.org      "Like" us  on Facebook and Instagram for our latest updates!      A healthy democracy works best when [employer] participate! Make sure you are registered to vote! If you have moved or changed any of your contact information, you will need to get this updated before voting! Scan the QR codes below to learn more!

## 2024-01-04 ENCOUNTER — Encounter (INDEPENDENT_AMBULATORY_CARE_PROVIDER_SITE_OTHER): Admitting: Ophthalmology

## 2024-01-04 DIAGNOSIS — H35033 Hypertensive retinopathy, bilateral: Secondary | ICD-10-CM | POA: Diagnosis not present

## 2024-01-04 DIAGNOSIS — I10 Essential (primary) hypertension: Secondary | ICD-10-CM | POA: Diagnosis not present

## 2024-01-04 DIAGNOSIS — Z794 Long term (current) use of insulin: Secondary | ICD-10-CM

## 2024-01-04 DIAGNOSIS — E113513 Type 2 diabetes mellitus with proliferative diabetic retinopathy with macular edema, bilateral: Secondary | ICD-10-CM | POA: Diagnosis not present

## 2024-01-07 ENCOUNTER — Other Ambulatory Visit: Payer: Self-pay | Admitting: Emergency Medicine

## 2024-01-14 ENCOUNTER — Other Ambulatory Visit: Payer: Self-pay | Admitting: Emergency Medicine

## 2024-01-14 ENCOUNTER — Encounter: Payer: Self-pay | Admitting: Emergency Medicine

## 2024-01-14 DIAGNOSIS — F330 Major depressive disorder, recurrent, mild: Secondary | ICD-10-CM

## 2024-01-16 ENCOUNTER — Ambulatory Visit (INDEPENDENT_AMBULATORY_CARE_PROVIDER_SITE_OTHER): Admitting: Allergy & Immunology

## 2024-01-16 ENCOUNTER — Other Ambulatory Visit: Payer: Self-pay

## 2024-01-16 ENCOUNTER — Encounter: Payer: Self-pay | Admitting: Allergy & Immunology

## 2024-01-16 DIAGNOSIS — J3089 Other allergic rhinitis: Secondary | ICD-10-CM

## 2024-01-16 DIAGNOSIS — K219 Gastro-esophageal reflux disease without esophagitis: Secondary | ICD-10-CM

## 2024-01-16 DIAGNOSIS — J310 Chronic rhinitis: Secondary | ICD-10-CM

## 2024-01-16 MED ORDER — FAMOTIDINE 40 MG PO TABS: 40.0000 mg | ORAL_TABLET | Freq: Every day | ORAL | 3 refills | Status: AC

## 2024-01-16 NOTE — Patient Instructions (Addendum)
 1. Moderate persistent asthma, uncomplicated - Keep up the Trelegy once puff once daily. - This will help to keep your breathing under good control.  - Daily controller medication(s): Trelegy 200/62.5/25mcg one puff once daily - Prior to physical activity: albuterol  2 puffs 10-15 minutes before physical activity. - Rescue medications: albuterol  4 puffs every 4-6 hours as needed - Asthma control goals:  * Full participation in all desired activities (may need albuterol  before activity) * Albuterol  use two time or less a week on average (not counting use with activity) * Cough interfering with sleep two time or less a month * Oral steroids no more than once a year * No hospitalizations  2. Chronic rhinitis - Testing today showed: NEGATIVE TO THE ENTIRE PANEL - Copy of test results provided.  - Blood work ordered to confirm.  - Stop taking: Flonase  - Start taking: Xhance  (fluticasone ) 1-2 sprays per nostril daily  - You can use an extra dose of the antihistamine, if needed, for breakthrough symptoms.  - Consider nasal saline rinses 1-2 times daily to remove allergens from the nasal cavities as well as help with mucous clearance (this is especially helpful to do before the nasal sprays are given)  3. Return in about 3 months (around 04/17/2024). You can have the follow up appointment with Dr. Iva or a Nurse Practicioner (our Nurse Practitioners are excellent and always have Physician oversight!).    Please inform us  of any Emergency Department visits, hospitalizations, or changes in symptoms. Call us  before going to the ED for breathing or allergy symptoms since we might be able to fit you in for a sick visit. Feel free to contact us  anytime with any questions, problems, or concerns.  It was a pleasure to see you and your family again today!  Websites that have reliable patient information: 1. American Academy of Asthma, Allergy, and Immunology: www.aaaai.org 2. Food Allergy Research  and Education (FARE): foodallergy.org 3. Mothers of Asthmatics: http://www.asthmacommunitynetwork.org 4. American College of Allergy, Asthma, and Immunology: www.acaai.org      "Like" us  on Facebook and Instagram for our latest updates!      A healthy democracy works best when [employer] participate! Make sure you are registered to vote! If you have moved or changed any of your contact information, you will need to get this updated before voting! Scan the QR codes below to learn more!      Airborne Adult Perc - 01/16/24 0837     Time Antigen Placed 9162    Allergen Manufacturer Jestine    Location Back    Number of Test 55    Panel 1 Select    1. Control-Buffer 50% Glycerol Negative    2. Control-Histamine 2+    3. Bahia Negative    4. French Southern Territ[place] Negative    5. Johnson Negative    6. [place]  Blue Negative    7. Meadow Fescue Negative    8. Perennial Rye Negative    9. Timothy Negative    10. Ragweed Mix Negative    11. Cocklebur Negative    12. Plantain,  English Negative    13. Baccharis Negative    14. Dog Fennel Negative    15. Russian Thistle Negative    16. Lamb's Quarters Negative    17. Sheep Sorrell Negative    18. Rough Pigweed Negative    19. Marsh Elder, Rough Negative    20. Mugwort, Common Negative    21. Box, Elder Negative    22.  Cedar, red Negative    23. Sweet Gum Negative    24. Pecan Pollen Negative    25. Pine Mix Negative    26. Walnut, Black Pollen Negative    27. Red Mulberry Negative    28. Ash Mix Negative    29. Birch Mix Negative    30. Beech American Negative    31. Cottonwood, [place] Negative    32. Hickory, White Negative    33. Maple Mix Negative    34. Oak, [place] Mix Negative    35. Sycamore Eastern Negative    36. Alternaria Alternata Negative    37. Cladosporium Herbarum Negative    38. Aspergillus Mix Negative    39. Penicillium Mix Negative    40. Bipolaris Sorokiniana (Helminthosporium) Negative    41. Drechslera  Spicifera (Curvularia) Negative    42. Mucor Plumbeus Negative    43. Fusarium Moniliforme Negative    44. Aureobasidium Pullulans (pullulara) Negative    45. Rhizopus Oryzae Negative    46. Botrytis Cinera Negative    47. Epicoccum Nigrum Negative    48. Phoma Betae Negative    49. Dust Mite Mix Negative    50. Cat Hair 10,000 BAU/ml Negative    51.  Dog Epithelia Negative    52. Mixed Feathers Negative    53. Horse Epithelia Negative    54. Cockroach, [place] Negative    55. Tobacco Leaf Negative          Intradermal - 01/16/24 0931     Time Antigen Placed 0930    Allergen Manufacturer Jestine    Location Back    Number of Test 16    Control Negative    Bahia Negative    French Southern Territories Negative    Johnson Negative    7 Grass Negative    Ragweed Mix Negative    Weed Mix Negative    Tree Mix Negative    Mold 1 Negative    Mold 2 Negative    Mold 3 Negative    Mold 4 Negative    Mite Mix Negative    Cat Negative    Dog Negative    Cockroach Negative          Check out this information handout from the [employer] of Asthma, Allergy, and Immunology on sinusitis!   https://rebrand.ly/AAC-Sinus-Span     Rhinitis (Hayfever) Overview  There are two types of rhinitis: allergic and non-allergic.  Allergic Rhinitis If you have allergic rhinitis, your immune system mistakenly identifies a typically harmless substance as an intruder. This substance is called an allergen. The immune system responds to the allergen by releasing histamine and chemical mediators that typically cause symptoms in the nose, throat, eyes, ears, skin and roof of the mouth.  Seasonal allergic rhinitis (hay fever) is most often caused by pollen carried in the air during different times of the year in different parts of the country.  Allergic rhinitis can also be triggered by common indoor allergens such as the dried skin flakes, urine and saliva found on pet dander, mold, droppings from dust mites  and cockroach particles. This is called perennial allergic rhinitis, as symptoms typically occur year-round.  In addition to allergen triggers, symptoms may also occur from irritants such as smoke and strong odors, or to changes in the temperature and humidity of the air. This happens because allergic rhinitis causes inflammation in the nasal lining, which increases sensitivity to inhalants.  Many people with allergic rhinitis are prone to allergic conjunctivitis (  eye allergy). In addition, allergic rhinitis can make symptoms of asthma worse for people who suffer from both conditions.  Nonallergic Rhinitis At least one out of three people with rhinitis symptoms do not have allergies. Nonallergic rhinitis usually afflicts adults and causes year-round symptoms, especially runny nose and nasal congestion. This condition differs from allergic rhinitis because the immune system is not involved.

## 2024-01-16 NOTE — Progress Notes (Signed)
 FOLLOW UP  Date of Service/Encounter:  01/16/24   Assessment:   Moderate persistent asthma, uncomplicated   Non-allergic rhinitis - getting blood work to confirm    Failed Symbicort   Plan/Recommendations:   1. Moderate persistent asthma, uncomplicated - Keep up the Trelegy once puff once daily. - This will help to keep your breathing under good control.  - Daily controller medication(s): Trelegy 200/62.5/25mcg one puff once daily - Prior to physical activity: albuterol  2 puffs 10-15 minutes before physical activity. - Rescue medications: albuterol  4 puffs every 4-6 hours as needed - Asthma control goals:  * Full participation in all desired activities (may need albuterol  before activity) * Albuterol  use two time or less a week on average (not counting use with activity) * Cough interfering with sleep two time or less a month * Oral steroids no more than once a year * No hospitalizations  2. Chronic rhinitis - Testing today showed: NEGATIVE TO THE ENTIRE PANEL - Copy of test results provided.  - Blood work ordered to confirm.  - Stop taking: Flonase  - Start taking: Xhance  (fluticasone ) 1-2 sprays per nostril daily  - You can use an extra dose of the antihistamine, if needed, for breakthrough symptoms.  - Consider nasal saline rinses 1-2 times daily to remove allergens from the nasal cavities as well as help with mucous clearance (this is especially helpful to do before the nasal sprays are given)  3. Return in about 3 months (around 04/17/2024). You can have the follow up appointment with Dr. Iva or a Nurse Practicioner (our Nurse Practitioners are excellent and always have Physician oversight!).     Subjective:   Joseph [name] is a 68 y.o. male presenting today for follow up of  Chief Complaint  Patient presents with   Allergy Testing    Joseph [name] has a history of the following: Patient Active Problem List   Diagnosis Date Noted   Chronic  abdominal pain 12/28/2023   Stage 3a chronic kidney disease (HCC) 08/16/2022   Sleep apnea 04/20/2022   Situational mixed anxiety and depressive disorder 03/14/2022   Dyslipidemia associated with type 2 diabetes mellitus (HCC) 08/03/2021   Depression 02/01/2021   Gastro-esophageal reflux disease with esophagitis 02/01/2021   Gastric polyps - hyperplastic 03/19/2019   Diabetic autonomic neuropathy (HCC) 03/19/2019   Benign prostatic hyperplasia 01/16/2019   Chronic low back pain 01/16/2019   Iron deficiency anemia 01/16/2019   Dyslipidemia 03/10/2016   Atherosclerotic heart disease of native coronary artery without angina pectoris 05/03/2015   Diabetic peripheral neuropathy associated with type 2 diabetes mellitus (HCC) 05/03/2015   Enlarged prostate without lower urinary tract symptoms (luts) 04/30/2015   Major depressive disorder, recurrent, mild 04/30/2015   Peripheral vascular disease 04/30/2015   Hx of adenomatous colonic polyps 06/11/2014   Hyperlipidemia 02/25/2014   Gastroparesis 02/17/2014   Neuropathy 12/03/2013   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  Discussed the use of AI scribe software for clinical note transcription with the patient and/or guardian, who gave verbal consent to proceed.  [name] is a 68 y.o. male presenting for skin testing. He was last seen on October 8. We could not  do testing because his insurance company does not cover testing on the same day as a New Patient visit. He has been off of all antihistamines 3 days in anticipation of the testing.   At that visit, lung testing was decent.  We recommended that he take something  daily to control his asthma symptoms.  We started him on Trelegy 200 mcg 1 puff once daily.  For his rhinitis, we decided to do environmental allergy testing.  He had previously tried Zyrtec and other nasal sprays without much relief.  Otherwise, there have been no changes to his past medical history, surgical history, family history, or social history.    Review of systems otherwise negative other than that mentioned in the HPI.    Objective:   There were no vitals taken for this visit. There is no height or weight on file to calculate BMI.    Physical exam deferred since this was a skin testing appointment only.   Diagnostic studies:   Allergy Studies:     Airborne Adult Perc - 01/16/24 0837     Time Antigen Placed 9162    Allergen Manufacturer Jestine    Location Back    Number of Test 55    Panel 1 Select    1. Control-Buffer 50% Glycerol Negative    2. Control-Histamine 2+    3. Bahia Negative    4. [place] Territ[place] Negative    5. Johnson Negative    6. [place]  Blue Negative    7. Meadow Fescue Negative    8. Perennial Rye Negative    9. Timothy Negative    10. Ragweed Mix Negative    11. Cocklebur Negative    12. Plantain,  English Negative    13. Baccharis Negative    14. Dog Fennel Negative    15. Russian Thistle Negative    16. Lamb's Quarters Negative    17. Sheep Sorrell Negative    18. Rough Pigweed Negative    19. Marsh Elder, Rough Negative    20. Mugwort, Common Negative    21. Box, Elder Negative    22. Cedar, red Negative    23. Sweet Gum Negative    24. Pecan Pollen Negative    25. Pine Mix Negative    26. Walnut, Black Pollen Negative    27. Red Mulberry Negative    28. Ash Mix Negative    29. Birch Mix Negative    30. Beech American Negative    31. Cottonwood, [place] Negative    32. Hickory, White Negative    33. Maple Mix Negative    34. Oak, [place] Mix Negative    35. Sycamore Eastern Negative    36. Alternaria Alternata Negative    37.  Cladosporium Herbarum Negative    38. Aspergillus Mix Negative    39. Penicillium Mix Negative    40. Bipolaris Sorokiniana (Helminthosporium) Negative    41. Drechslera Spicifera (Curvularia) Negative    42. Mucor Plumbeus Negative    43. Fusarium Moniliforme Negative    44. Aureobasidium Pullulans (pullulara) Negative    45. Rhizopus Oryzae Negative    46. Botrytis Cinera Negative    47. Epicoccum Nigrum Negative    48. Phoma Betae Negative    49. Dust Mite Mix Negative    50. Cat Hair 10,000 BAU/ml Negative    51.  Dog Epithelia Negative    52. Mixed Feathers Negative    53. Horse Epithelia Negative    54. Cockroach, [place] Negative  55. Tobacco Leaf Negative          Intradermal - 01/16/24 0931     Time Antigen Placed 0930    Allergen Manufacturer Jestine    Location Back    Number of Test 16    Control Negative    Bahia Negative    [place] Territ[place] Negative    Johnson Negative    7 Grass Negative    Ragweed Mix Negative    Weed Mix Negative    Tree Mix Negative    Mold 1 Negative    Mold 2 Negative    Mold 3 Negative    Mold 4 Negative    Mite Mix Negative    Cat Negative    Dog Negative    Cockroach Negative          Allergy testing results were read and interpreted by myself, documented by clinical staff.      Marty Shaggy, MD  [facility] 

## 2024-01-18 ENCOUNTER — Other Ambulatory Visit: Payer: Self-pay | Admitting: Emergency Medicine

## 2024-01-31 ENCOUNTER — Ambulatory Visit

## 2024-02-06 ENCOUNTER — Other Ambulatory Visit: Payer: Self-pay | Admitting: Internal Medicine

## 2024-02-07 ENCOUNTER — Ambulatory Visit: Attending: Internal Medicine | Admitting: Internal Medicine

## 2024-02-07 ENCOUNTER — Encounter: Payer: Self-pay | Admitting: Internal Medicine

## 2024-02-07 VITALS — BP 100/64 | HR 61 | Ht 66.0 in | Wt 193.0 lb

## 2024-02-07 DIAGNOSIS — I951 Orthostatic hypotension: Secondary | ICD-10-CM

## 2024-02-07 DIAGNOSIS — I25810 Atherosclerosis of coronary artery bypass graft(s) without angina pectoris: Secondary | ICD-10-CM

## 2024-02-07 DIAGNOSIS — E782 Mixed hyperlipidemia: Secondary | ICD-10-CM | POA: Diagnosis not present

## 2024-02-07 DIAGNOSIS — I10 Essential (primary) hypertension: Secondary | ICD-10-CM | POA: Diagnosis not present

## 2024-02-07 MED ORDER — PRAVASTATIN SODIUM 40 MG PO TABS: 40.0000 mg | ORAL_TABLET | Freq: Every day | ORAL | 3 refills | Status: AC

## 2024-02-07 NOTE — Progress Notes (Unsigned)
 OFFICE NOTE  Chief Complaint:  Follow-up  Primary Care Physician: Purcell Emil Schanz, MD  HPI:  Joseph [name] is a 68 year old gentleman who recently underwent a MET test with a low VO2. I was concerned about multi-vessel coronary disease and then performed cardiac catheterization on him on January 30, 2012. This demonstrated proximal to mid LAD disease and disease of 2 large OM vessels concerning for surgical disease. I talked with [employer] and he recommended bypass surgery. He did undergo bypass surgery on February 06, 2012 with a 5-vessel bypass, a LIMA to the LAD, a saphenous vein to the diagonal, a saphenous vein to the distal OM, and sequential reverse saphenous vein graft to the posterior descending and 2nd OM. He did well with this, although it was complicated with some postoperative nausea. He has had some shortness of breath, weakness and slow recovery. He has also had some tenderness in the chest wall around the area of the sternum and incision which he was told by Dr. Army could take some time to improve. We have also made some adjustments in his medicines. He is undergoing home occupational therapy by [facility] and they are recommending more upper extremity work which I agree with.  Today he continues to complain about tightness across the chest wall. The midline incision does demonstrate a keloid scar, and this may be responsible for his feeling of tightness. It potentially is possible that the sternum was too closely approximated, however Dr. Army did not indicate the event this was a problem. Overall Joseph [name] is doing well without complaints of shortness of breath, chest pain, palpitations, or syncope. Does report some mild lower stray swelling, but his weight has generally been the same.  He is followed by Dr. Prescilla for chronic kidney disease. Finally he reports has been having some problems with what sounds like tinea cruris.  He is currently taking a topical  steroid and prednisone.  Joseph [name] returns today and is without complaints. Denies shortness of breath or chest pain. He reports his kidneys have been stable. He has managed to lose a small amount of weight. He says that his diabetes is well controlled. He has not had a cholesterol test in over a year.  Joseph [name] returns for follow-up today. He reports he is feeling quite well. He denies any chest pain or shortness of breath. He is noted to sleep a lot by his significant other. He says that since he is disabled he is not as active as he once was. He reported to me that his primary care provider has stopped seeing patients enclosed the clinic. He currently is switching to a new primary care provider. His diabetes is followed by Dr. Tommas in his A1c is in the range of 5-6.  03/09/2016  Joseph [name] was seen again today in follow-up. Overall he is again without complaints. Blood pressure is well-controlled. Weight is fairly stable, in fact actually down 8 pounds. He reports pretty good diabetes control. His cholesterol is at goal. He denies any recurrent chest pain. He continues to feel tightness in his chest wall which I think is related to scar tissue, particularly a keloid scar at the chest wall incision. EKG shows sinus bradycardia 54 with moderate voltage criteria for LVH.  05/31/2017  Joseph [name] returns today for follow-up.  He is done well over the past year and a half.  He denies any chest pain or worsening shortness of breath but is not very physically active.  Weight  has been fairly stable but could be improved.  Blood pressure is at goal today.  He is followed by Dr. Darice Henle for primary care.  We recently received some lab work which was completed in September 2017.  His LDL at that time was 51 hemoglobin A1c of 6.7.  He is coronary artery bypass grafting was in 2013 and he is not had an ischemia evaluation since that time.  02/13/2018  Joseph [name] is seen today for chest pain.  This  happened a few weeks ago and he described left central chest pain which radiated to the left arm.  The episode came on rather abruptly and was more intense for 30 minutes to an hour and then gently subsided over the next 24 hours.  Since then he has had no more symptoms.  He was not seen in the ER for this.  He did not have nitroglycerin  to take although saw his primary care provider recently who gave him some.  Since then he has had no further chest pain.  Blood pressure is elevated today.  EKG shows sinus bradycardia 56 with moderate voltage criteria for LVH.  EKG performed by his primary apparently did not show any ischemia.  02/13/2019  Joseph [name] returns today for follow-up.  I saw him about 6 months ago via virtual visit.  Blood pressure was well controlled.  Recently saw his GI doctor again with good blood pressure control however today in the office it was 194/73.  He says he might have been anxious and did report taking his medicines today.  EKG shows a sinus bradycardia.  He is asymptomatic he denies any chest pain or worsening shortness of breath.  He had labs through his PCP which indicates good control over his diabetes with hemoglobin A1c of 5.7.  He is managed to lose some more weight.  His cholesterol has been at target.  05/23/2021  Joseph [name] is seen today in follow-up.  Recently has had some more shortness of breath.  His daughter noted that when taking out his trash or going up a hill he gets more short of breath.  He has had some weight gain now up about 5 to 7 pounds since August 2022.  He is attributed to that.  He could certainly be his bypass graft.  He is now 10 years out from CABG.  Blood pressure was a little elevated today.  He reports better control at home and just took his medicine about 30 minutes ago.  He has not had recent lipids.  He is overdue for follow-up with his PCP.  He reports good glycemic control.  08/15/2022  Joseph [name] returns today for follow-up of  orthostatic hypotension.  This is seemingly improved after stopping his diuretic.  He is also recently apparently struggled from some depression.  He denies any chest pain.  He is due for follow-up with his PCP in May.  His last lipids were well-controlled with an LDL of 46 in May 2023.    PMHx:  Past Medical History:  Diagnosis Date   Anemia    Anxiety    Arthritis    Asthma    BPH (benign prostatic hypertrophy)    Cataract    Chronic renal insufficiency, stage III (moderate)     kidney   Coronary artery disease    Dr Mona    Depression    Diabetes mellitus without complication (HCC) 1988   insulin  dependent   Diabetic autonomic neuropathy (HCC) 03/19/2019  Gastric polyps - hyperplastic 03/19/2019   Gastroparesis due to DM [facility])    GERD (gastroesophageal reflux disease)    Hx of adenomatous colonic polyps 06/11/2014   Hyperlipidemia    Hypertension    Myocardial infarction [facility])    Neuropathy    PVD (peripheral vascular disease)    Sleep apnea 2010   refuses to use cpap    Past Surgical History:  Procedure Laterality Date   ANKLE FRACTURE SURGERY Right    BIOPSY  02/08/2023   Procedure: BIOPSY;  Surgeon: Avram Lupita BRAVO, MD;  Location: [facility];  Service: Gastroenterology;;   CARDIAC CATHETERIZATION  01/30/2012   This demonstrated proximal to mid LAD disease and disease of 2 large OM vessels concerning for surgical disease.   COLONOSCOPY     CORONARY ARTERY BYPASS GRAFT  02/06/2012   Procedure: CORONARY ARTERY BYPASS GRAFTING (CABG); 5- Vessel bypass a LIMA to the LAD, a saphenous vein to the to the diagonal , a saphenous vein to the distal OM, and sequential reverse saphenous vien graft to the posterior descending and 2nd OM. Surgeon: Dallas KATHEE Jude, MD;  Location: [facility] OR;  Service: Open Heart Surgery;  Laterality: [name];   ESOPHAGOGASTRODUODENOSCOPY  2005   ESOPHAGOGASTRODUODENOSCOPY [name] 02/08/2023   Procedure: ESOPHAGOGASTRODUODENOSCOPY (EGD);  Surgeon:  Avram Lupita BRAVO, MD;  Location: [facility];  Service: Gastroenterology;  Laterality: [name];   GASTRIC VARICES BANDING  02/08/2023   Procedure: GASTRIC POLYPS BANDING;  Surgeon: Avram Lupita BRAVO, MD;  Location: [facility];  Service: Gastroenterology;;   LEFT HEART CATHETERIZATION WITH CORONARY ANGIOGRAM [name] 01/30/2012   Procedure: LEFT HEART CATHETERIZATION WITH CORONARY ANGIOGRAM;  Surgeon: Vinie KYM Maxcy, MD;  Location: [facility] CATH LAB;  Service: Cardiovascular;  Laterality: [name];   teeth removal  08/16/2021   TOOTH EXTRACTION [name] 09/16/2021   Procedure: DENTAL RESTORATION/EXTRACTIONS;  Surgeon: Sheryle Hamilton, DMD;  Location: [facility] OR;  Service: Oral Surgery;  Laterality: [name];   UPPER GASTROINTESTINAL ENDOSCOPY      FAMHx:  Family History  Problem Relation Age of Onset   Diabetes Father    Hypertension Father    Cancer Sister        unknown   Colon cancer Neg Hx    Esophageal cancer Neg Hx    Rectal cancer Neg Hx    Stomach cancer Neg Hx    Pancreatic cancer Neg Hx    Sleep apnea Neg Hx     SOCHx:   reports that he has never smoked. He has never used smokeless tobacco. He reports that he does not drink alcohol and does not use drugs.  ALLERGIES:  Allergies  Allergen Reactions   Metformin Diarrhea and Other (See Comments)    diarrhea   Robaxin [Methocarbamol] Itching and Rash    ROS: Pertinent items noted in HPI and remainder of comprehensive ROS otherwise negative.  HOME MEDS: Current Outpatient Medications  Medication Sig Dispense Refill   albuterol  (VENTOLIN  HFA) 108 (90 Base) MCG/ACT inhaler Inhale 2 puffs into the lungs every 4 (four) hours as needed for wheezing or shortness of breath. 18 g 1   amLODipine  (NORVASC ) 5 MG tablet Take 1 tablet (5 mg total) by mouth daily. 90 tablet 3   Ascorbic Acid (VITAMIN C PO) Take by mouth.     aspirin  EC 81 MG tablet Take 81 mg by mouth daily.     B-D UF III MINI PEN NEEDLES 31G X 5 MM MISC Inject into the skin 3 (three) times  daily.  B-D UF III MINI PEN NEEDLES 31G X 5 MM MISC CHECK THREE TIMES DAILY DX E11.9 300 each 1   busPIRone  (BUSPAR ) 5 MG tablet Take 1 tablet (5 mg total) by mouth 2 (two) times daily as needed. 30 tablet 1   Cholecalciferol  (VITAMIN D3) 25 MCG (1000 UT) CAPS Take 1 capsule (1,000 Units total) by mouth daily. Follow-up appt due w/ Dr. Purcell must see provider for future refills 90 capsule 0   cyclobenzaprine  (FLEXERIL ) 10 MG tablet Take 1 tablet (10 mg total) by mouth 3 (three) times daily as needed. for muscle spams 270 tablet 3   DULoxetine  (CYMBALTA ) 60 MG capsule Take 1 capsule by mouth once daily 90 capsule 0   ergocalciferol (VITAMIN D2) 1.25 MG (50000 UT) capsule Take 50,000 Units by mouth every Wednesday.     famotidine  (PEPCID ) 40 MG tablet Take 1 tablet (40 mg total) by mouth daily. 90 tablet 3   FARXIGA 10 MG TABS tablet Take 10 mg by mouth daily.     fenofibrate  160 MG tablet Take 1 tablet by mouth once daily 90 tablet 0   ferrous sulfate  325 (65 FE) MG tablet Take 325 mg by mouth daily with breakfast.     Finerenone  (KERENDIA ) 10 MG TABS Take 1 tablet (10 mg total) by mouth daily. 90 tablet 3   fluticasone  (FLONASE ) 50 MCG/ACT nasal spray Place 2 sprays into both nostrils daily. 16 g 6   furosemide  (LASIX ) 20 MG tablet TAKE 1 TABLET (20 MG TOTAL) BY MOUTH DAILY AS NEEDED FOR EDEMA. 90 tablet 3   glucose blood (ONETOUCH VERIO) test strip CHECK SUGARS 4 TIMES A DAY DX E11.9     insulin  aspart (NOVOLOG  FLEXPEN) 100 UNIT/ML FlexPen INJECT 3 TIMES A DAY PER SLIDING SCALE UP TO 30 UNITS A DAY DX E11.9 3 mL 5   insulin  degludec (TRESIBA  FLEXTOUCH) 100 UNIT/ML FlexTouch Pen Inject 26 Units into the skin daily. 3 mL 3   Insulin  Pen Needle (B-D UF III MINI PEN NEEDLES) 31G X 5 MM MISC CHECK THREE TIMES DAILY DX E11.9     Insulin  Pen Needle (B-D UF III MINI PEN NEEDLES) 31G X 5 MM MISC CHECK THREE TIMES DAILY DX E11.9     Insulin  Syringe-Needle U-100 31G X 5/16 0.3 ML MISC CHECK THREE  TIMES DAILY DX E11.9     Lancets (ONETOUCH DELICA PLUS LANCET33G) MISC Apply topically.     levocetirizine (XYZAL ) 5 MG tablet TAKE 1 TABLET BY MOUTH ONCE DAILY IN THE EVENING 270 tablet 0   metoCLOPramide  (REGLAN ) 10 MG tablet Take 1 tablet (10 mg total) by mouth every 6 (six) hours as needed. for nausea 60 tablet 0   metoprolol  tartrate (LOPRESSOR ) 25 MG tablet Take 1 tablet by mouth twice daily 132 tablet 0   nitroGLYCERIN  (NITROSTAT ) 0.4 MG SL tablet Place 1 tablet (0.4 mg total) under the tongue every 5 (five) minutes as needed for chest pain. 20 tablet 3   omeprazole  (PRILOSEC) 40 MG capsule Take 1 capsule by mouth once daily 21 capsule 0   ONETOUCH VERIO test strip 4 (four) times daily.     pravastatin  (PRAVACHOL ) 40 MG tablet Take 1 tablet (40 mg total) by mouth daily. 30 tablet 1   valsartan -hydrochlorothiazide  (DIOVAN -HCT) 160-12.5 MG tablet Take 1 tablet by mouth daily. 90 tablet 3   No current facility-administered medications for this visit.    LABS/IMAGING: No results found for this or any previous visit (from the past 48 hours).  No results found.  VITALS: BP 100/64   Pulse 61   Ht 5' 6 (1.676 m)   Wt 193 lb (87.5 kg)   SpO2 93%   BMI 31.15 kg/m   EXAM: General appearance: alert and no distress Neck: no adenopathy, no carotid bruit, no JVD, supple, symmetrical, trachea midline and thyroid not enlarged, symmetric, no tenderness/mass/nodules Lungs: clear to auscultation bilaterally Heart: regular rate and rhythm, S1, S2 normal, no murmur, click, rub or gallop Abdomen: soft, non-tender; bowel sounds normal; no masses,  no organomegaly Extremities: extremities normal, atraumatic, no cyanosis or edema Pulses: 2+ and symmetric Skin: Hypertrophic scar of the midline sternotomy incision Neurologic: Grossly normal  EKG: EKG Interpretation Date/Time:  Thursday February 07 2024 15:50:18 EST Ventricular Rate:  61 PR Interval:  192 QRS Duration:  98 QT  Interval:  456 QTC Calculation: 459 R Axis:   -17  Text Interpretation: Normal sinus rhythm Incomplete right bundle branch block When compared with ECG of 07-Feb-2012 06:50, Incomplete right bundle branch block is now Present Confirmed by Mona Kent [phone]) on 02/07/2024 3:55:55 PM    ASSESSMENT: Orthostatic hypotension Coronary disease status post 5 vessel bypass in November 2013, with LIMA to LAD, SVG to diagonal, SVG to OM, SVG to posterior descending and SVG to the second OM Insulin -dependent diabetes Dyslipidemia Hypertension Obesity Chronic kidney disease stage II  PLAN: 1.  Mr. Mackowski has had improvement in his orthostatic hypotension off of diuretics.  He denies any worsening shortness of breath or chest pain symptoms.  Blood pressure has been controlled.  Heart rate remains in the 50s.  I would not recommend any changes in his meds today.  He has had some issues with weakness and decreased gait which might be related to struggles he is having with some depression.  He saw neurology and is scheduled to see a therapist.  He might benefit from a stimulant as well.  Follow-up with me annually or sooner as necessary.  Kent KYM Mona, MD, [facility], FACP    [facility]  Medical Director of the [facility] Lipid Disorders &  Cardiovascular Risk Reduction Clinic Diplomate of the American Board of Clinical Lipidology Attending Cardiologist  Direct Dial: [phone]  Fax: [phone]  Website:  [email or web address] Kent JAYSON Mona 02/07/2024, 3:56 PM

## 2024-02-07 NOTE — Patient Instructions (Signed)
 Medication Instructions:  Your physician recommends that you continue on your current medications as directed. Please refer to the Current Medication list given to you today.   *If you need a refill on your cardiac medications before your next appointment, please call your pharmacy*  Lab Work: - None ordered  Testing/Procedures: - None ordered  Follow-Up: At [facility], you and your health needs are our priority.  As part of our continuing mission to provide you with exceptional heart care, our providers are all part of one team.  This team includes your primary Cardiologist (physician) and Advanced Practice Providers or APPs (Physician Assistants and Nurse Practitioners) who all work together to provide you with the care you need, when you need it.  Your next appointment:   1 year(s)  Provider:   Vinie JAYSON Maxcy, MD    We recommend signing up for the patient portal called MyChart.  Sign up information is provided on this After Visit Summary.  MyChart is used to connect with patients for Virtual Visits (Telemedicine).  Patients are able to view lab/test results, encounter notes, upcoming appointments, etc.  Non-urgent messages can be sent to your provider as well.   To learn more about what you can do with MyChart, go to [email or web address].

## 2024-02-08 ENCOUNTER — Encounter (INDEPENDENT_AMBULATORY_CARE_PROVIDER_SITE_OTHER): Admitting: Ophthalmology

## 2024-02-08 DIAGNOSIS — Z794 Long term (current) use of insulin: Secondary | ICD-10-CM | POA: Diagnosis not present

## 2024-02-08 DIAGNOSIS — E113513 Type 2 diabetes mellitus with proliferative diabetic retinopathy with macular edema, bilateral: Secondary | ICD-10-CM | POA: Diagnosis not present

## 2024-02-08 DIAGNOSIS — H35033 Hypertensive retinopathy, bilateral: Secondary | ICD-10-CM | POA: Diagnosis not present

## 2024-02-08 DIAGNOSIS — I10 Essential (primary) hypertension: Secondary | ICD-10-CM | POA: Diagnosis not present

## 2024-02-14 DIAGNOSIS — E7800 Pure hypercholesterolemia, unspecified: Secondary | ICD-10-CM | POA: Diagnosis not present

## 2024-02-14 DIAGNOSIS — N182 Chronic kidney disease, stage 2 (mild): Secondary | ICD-10-CM | POA: Diagnosis not present

## 2024-02-14 DIAGNOSIS — E1165 Type 2 diabetes mellitus with hyperglycemia: Secondary | ICD-10-CM | POA: Diagnosis not present

## 2024-02-14 DIAGNOSIS — Z23 Encounter for immunization: Secondary | ICD-10-CM | POA: Diagnosis not present

## 2024-02-14 DIAGNOSIS — E10319 Type 1 diabetes mellitus with unspecified diabetic retinopathy without macular edema: Secondary | ICD-10-CM | POA: Diagnosis not present

## 2024-02-14 DIAGNOSIS — I10 Essential (primary) hypertension: Secondary | ICD-10-CM | POA: Diagnosis not present

## 2024-02-14 DIAGNOSIS — G609 Hereditary and idiopathic neuropathy, unspecified: Secondary | ICD-10-CM | POA: Diagnosis not present

## 2024-03-04 ENCOUNTER — Ambulatory Visit

## 2024-03-04 VITALS — Ht 66.0 in | Wt 193.0 lb

## 2024-03-04 DIAGNOSIS — Z0000 Encounter for general adult medical examination without abnormal findings: Secondary | ICD-10-CM

## 2024-03-04 NOTE — Patient Instructions (Signed)
 Joseph [name],  Thank you for taking the time for your Medicare Wellness Visit. I appreciate your continued commitment to your health goals. Please review the care plan we discussed, and feel free to reach out if I can assist you further.  Please note that Annual Wellness Visits do not include a physical exam. Some assessments may be limited, especially if the visit was conducted virtually. If needed, we may recommend an in-person follow-up with your provider.  Ongoing Care Seeing your primary care provider every 3 to 6 months helps us  monitor your health and provide consistent, personalized care.   Referrals If a referral was made during today's visit and you haven't received any updates within two weeks, please contact the referred provider directly to check on the status.  Recommended Screenings:  Health Maintenance  Topic Date Due   Complete foot exam   Never done   Zoster (Shingles) Vaccine (1 of 2) 03/[birth year]/1974   COVID-19 Vaccine (4 - 2025-26 season) 11/26/2023   Hemoglobin A1C  05/01/2024   Yearly kidney function blood test for diabetes  07/29/2024   Yearly kidney health urinalysis for diabetes  07/29/2024   Eye exam for diabetics  11/29/2024   Medicare Annual Wellness Visit  03/04/2025   Colon Cancer Screening  11/06/2029   DTaP/Tdap/Td vaccine (2 - Td or Tdap) 06/11/2031   Pneumococcal Vaccine for age over 59  Completed   Flu Shot  Completed   Hepatitis C Screening  Completed   Meningitis B Vaccine  Aged Out       03/04/2024   10:45 AM  Advanced Directives  Does Patient Have a Medical Advance Directive? No  Would patient like information on creating a medical advance directive? No - Patient declined    Vision: Annual vision screenings are recommended for early detection of glaucoma, cataracts, and diabetic retinopathy. These exams can also reveal signs of chronic conditions such as diabetes and high blood pressure.  Dental: Annual dental screenings help detect early  signs of oral cancer, gum disease, and other conditions linked to overall health, including heart disease and diabetes.

## 2024-03-04 NOTE — Progress Notes (Signed)
 Chief Complaint  Patient presents with   Medicare Wellness     Subjective:   Joseph [name] is a 68 y.o. male who presents for a Medicare Annual Wellness Visit.  Visit info / Clinical Intake: Medicare Wellness Visit Type:: Subsequent Annual Wellness Visit Persons participating in visit and providing information:: patient; patient & caregiver Jarren Para - Dtr) Medicare Wellness Visit Mode:: Telephone If telephone:: video declined Since this visit was completed virtually, some vitals may be partially provided or unavailable. Missing vitals are due to the limitations of the virtual format.: Documented vitals are patient reported If Telephone or Video please confirm:: I connected with patient using audio/video enable telemedicine. I verified patient identity with two identifiers, discussed telehealth limitations, and patient agreed to proceed. Patient Location:: Home Provider Location:: Office Interpreter Needed?: No Pre-visit prep was completed: yes AWV questionnaire completed by patient prior to visit?: no Living arrangements:: with family/others Patient's Overall Health Status Rating: good Typical amount of pain: none Does pain affect daily life?: no Are you currently prescribed opioids?: no  Dietary Habits and Nutritional Risks How many meals a day?: 3 Eats fruit and vegetables daily?: yes Most meals are obtained by: preparing own meals; eating out; having others provide food In the last 2 weeks, have you had any of the following?: none Diabetic:: (!) yes Any non-healing wounds?: no How often do you check your BS?: 3; as needed (fasting - 118) Would you like to be referred to a Nutritionist or for Diabetic Management? : no  Functional Status Activities of Daily Living (to include ambulation/medication): Independent Ambulation: Independent with device- listed below Home Assistive Devices/Equipment: Cane; Eyeglasses; CPAP; Dentures (specify type) Medication  Administration: Needs assistance (comment) (Dtr helps) Is this a change from baseline?: Change from baseline, expected to last >3 days Home Management (perform basic housework or laundry): Needs assistance (comment) (Dtr helps) Manage your own finances?: yes Primary transportation is: driving; family / friends (Dtr helps) Concerns about vision?: no *vision screening is required for WTM* Concerns about hearing?: no  Fall Screening Falls in the past year?: 1 Number of falls in past year: 0 (1) Was there an injury with Fall?: 0 Fall Risk Category Calculator: 1 Patient Fall Risk Level: Low Fall Risk  Fall Risk Patient at Risk for Falls Due to: Impaired balance/gait Fall risk Follow up: Falls evaluation completed; Falls prevention discussed  Home and Transportation Safety: All rugs have non-skid backing?: [name], no rugs All stairs or steps have railings?: yes (outside) Grab bars in the bathtub or shower?: (!) no (shower chair) Have non-skid surface in bathtub or shower?: yes Good home lighting?: yes Regular seat belt use?: yes Hospital stays in the last year:: no  Cognitive Assessment Difficulty concentrating, remembering, or making decisions? : no Will 6CIT or Mini Cog be Completed: yes What year is it?: 0 points What month is it?: 0 points Give patient an address phrase to remember (5 components): [address] [place], [place] About what time is it?: 0 points Count backwards from 20 to 1: 0 points Say the months of the year in reverse: 0 points Repeat the address phrase from earlier: 0 points 6 CIT Score: 0 points  Advance Directives (For Healthcare) Does Patient Have a Medical Advance Directive?: No Would patient like information on creating a medical advance directive?: No - Patient declined  Reviewed/Updated  Reviewed/Updated: Reviewed All (Medical, Surgical, Family, Medications, Allergies, Care Teams, Patient Goals)    Allergies (verified) Metformin and Robaxin  [methocarbamol]  Current Medications (verified) Outpatient Encounter Medications as of 03/04/2024  Medication Sig   albuterol  (VENTOLIN  HFA) 108 (90 Base) MCG/ACT inhaler Inhale 2 puffs into the lungs every 4 (four) hours as needed for wheezing or shortness of breath.   amLODipine  (NORVASC ) 5 MG tablet Take 1 tablet (5 mg total) by mouth daily.   Ascorbic Acid (VITAMIN C PO) Take by mouth.   aspirin  EC 81 MG tablet Take 81 mg by mouth daily.   B-D UF III MINI PEN NEEDLES 31G X 5 MM MISC Inject into the skin 3 (three) times daily.   B-D UF III MINI PEN NEEDLES 31G X 5 MM MISC CHECK THREE TIMES DAILY DX E11.9   busPIRone  (BUSPAR ) 5 MG tablet Take 1 tablet (5 mg total) by mouth 2 (two) times daily as needed.   Cholecalciferol  (VITAMIN D3) 25 MCG (1000 UT) CAPS Take 1 capsule (1,000 Units total) by mouth daily. Follow-up appt due w/ Dr. Purcell must see provider for future refills   cyclobenzaprine  (FLEXERIL ) 10 MG tablet Take 1 tablet (10 mg total) by mouth 3 (three) times daily as needed. for muscle spams   DULoxetine  (CYMBALTA ) 60 MG capsule Take 1 capsule by mouth once daily   ergocalciferol (VITAMIN D2) 1.25 MG (50000 UT) capsule Take 50,000 Units by mouth every Wednesday.   famotidine  (PEPCID ) 40 MG tablet Take 1 tablet (40 mg total) by mouth daily.   FARXIGA 10 MG TABS tablet Take 10 mg by mouth daily.   fenofibrate  160 MG tablet Take 1 tablet by mouth once daily   ferrous sulfate  325 (65 FE) MG tablet Take 325 mg by mouth daily with breakfast.   Finerenone  (KERENDIA ) 10 MG TABS Take 1 tablet (10 mg total) by mouth daily.   fluticasone  (FLONASE ) 50 MCG/ACT nasal spray Place 2 sprays into both nostrils daily.   furosemide  (LASIX ) 20 MG tablet TAKE 1 TABLET (20 MG TOTAL) BY MOUTH DAILY AS NEEDED FOR EDEMA.   glucose blood (ONETOUCH VERIO) test strip CHECK SUGARS 4 TIMES A DAY DX E11.9   insulin  aspart (NOVOLOG  FLEXPEN) 100 UNIT/ML FlexPen INJECT 3 TIMES A DAY PER SLIDING SCALE UP TO 30  UNITS A DAY DX E11.9   insulin  degludec (TRESIBA  FLEXTOUCH) 100 UNIT/ML FlexTouch Pen Inject 26 Units into the skin daily.   Insulin  Pen Needle (B-D UF III MINI PEN NEEDLES) 31G X 5 MM MISC CHECK THREE TIMES DAILY DX E11.9   Insulin  Pen Needle (B-D UF III MINI PEN NEEDLES) 31G X 5 MM MISC CHECK THREE TIMES DAILY DX E11.9   Insulin  Syringe-Needle U-100 31G X 5/16 0.3 ML MISC CHECK THREE TIMES DAILY DX E11.9   Lancets (ONETOUCH DELICA PLUS LANCET33G) MISC Apply topically.   levocetirizine (XYZAL ) 5 MG tablet TAKE 1 TABLET BY MOUTH ONCE DAILY IN THE EVENING   metoCLOPramide  (REGLAN ) 10 MG tablet Take 1 tablet (10 mg total) by mouth every 6 (six) hours as needed. for nausea   metoprolol  tartrate (LOPRESSOR ) 25 MG tablet Take 1 tablet by mouth twice daily   nitroGLYCERIN  (NITROSTAT ) 0.4 MG SL tablet Place 1 tablet (0.4 mg total) under the tongue every 5 (five) minutes as needed for chest pain.   omeprazole  (PRILOSEC) 40 MG capsule Take 1 capsule by mouth once daily   ONETOUCH VERIO test strip 4 (four) times daily.   pravastatin  (PRAVACHOL ) 40 MG tablet Take 1 tablet (40 mg total) by mouth daily.   valsartan -hydrochlorothiazide  (DIOVAN -HCT) 160-12.5 MG tablet Take 1 tablet by mouth daily.  No facility-administered encounter medications on file as of 03/04/2024.    History: Past Medical History:  Diagnosis Date   Anemia    Anxiety    Arthritis    Asthma    BPH (benign prostatic hypertrophy)    Cataract    Chronic renal insufficiency, stage III (moderate)    [facility]   Coronary artery disease    Dr Mona    Depression    Diabetes mellitus without complication (HCC) 1988   insulin  dependent   Diabetic autonomic neuropathy (HCC) 03/19/2019   Gastric polyps - hyperplastic 03/19/2019   Gastroparesis due to DM (HCC)    GERD (gastroesophageal reflux disease)    Hx of adenomatous colonic polyps 06/11/2014   Hyperlipidemia    Hypertension    Myocardial infarction [facility])    Neuropathy     PVD (peripheral vascular disease)    Sleep apnea 2010   refuses to use cpap   Past Surgical History:  Procedure Laterality Date   ANKLE FRACTURE SURGERY Right    BIOPSY  02/08/2023   Procedure: BIOPSY;  Surgeon: Avram Lupita BRAVO, MD;  Location: [facility];  Service: Gastroenterology;;   CARDIAC CATHETERIZATION  01/30/2012   This demonstrated proximal to mid LAD disease and disease of 2 large OM vessels concerning for surgical disease.   COLONOSCOPY     CORONARY ARTERY BYPASS GRAFT  02/06/2012   Procedure: CORONARY ARTERY BYPASS GRAFTING (CABG); 5- Vessel bypass a LIMA to the LAD, a saphenous vein to the to the diagonal , a saphenous vein to the distal OM, and sequential reverse saphenous vien graft to the posterior descending and 2nd OM. Surgeon: Dallas KATHEE Jude, MD;  Location: [facility];  Service: Open Heart Surgery;  Laterality: [name];   ESOPHAGOGASTRODUODENOSCOPY  2005   ESOPHAGOGASTRODUODENOSCOPY [name] 02/08/2023   Procedure: ESOPHAGOGASTRODUODENOSCOPY (EGD);  Surgeon: Avram Lupita BRAVO, MD;  Location: [facility];  Service: Gastroenterology;  Laterality: [name];   GASTRIC VARICES BANDING  02/08/2023   Procedure: GASTRIC POLYPS BANDING;  Surgeon: Avram Lupita BRAVO, MD;  Location: [facility];  Service: Gastroenterology;;   LEFT HEART CATHETERIZATION WITH CORONARY ANGIOGRAM [name] 01/30/2012   Procedure: LEFT HEART CATHETERIZATION WITH CORONARY ANGIOGRAM;  Surgeon: Vinie KYM Mona, MD;  Location: [facility] C[facility] LAB;  Service: Cardiovascular;  Laterality: [name];   teeth removal  08/16/2021   TOOTH EXTRACTION [name] 09/16/2021   Procedure: DENTAL RESTORATION/EXTRACTIONS;  Surgeon: Sheryle Hamilton, DMD;  Location: [facility] OR;  Service: Oral Surgery;  Laterality: [name];   UPPER GASTROINTESTINAL ENDOSCOPY     Family History  Problem Relation Age of Onset   Diabetes Father    Hypertension Father    Cancer Sister        unknown   Colon cancer Neg Hx    Esophageal cancer Neg Hx    Rectal cancer Neg Hx    Stomach  cancer Neg Hx    Pancreatic cancer Neg Hx    Sleep apnea Neg Hx    Social History   Occupational History   Occupation: disabled  Tobacco Use   Smoking status: Never   Smokeless tobacco: Never  Vaping Use   Vaping status: Never Used  Substance and Sexual Activity   Alcohol use: No   Drug use: No   Sexual activity: Not Currently   Tobacco Counseling Counseling given: Not Answered  SDOH Screenings   Food Insecurity: No Food Insecurity (03/04/2024)  Housing: Unknown (03/04/2024)  Transportation Needs: No Transportation Needs (03/04/2024)  Utilities: Not At Risk (03/04/2024)  Alcohol  Screen: Low Risk  (02/03/2022)  Depression (PHQ2-9): Low Risk  (03/04/2024)  Financial Resource Strain: Low Risk  (10/30/2023)  Physical Activity: Inactive (03/04/2024)  Social Connections: Socially Isolated (03/04/2024)  Stress: Stress Concern Present (03/04/2024)  Tobacco Use: Low Risk  (03/04/2024)  Health Literacy: Adequate Health Literacy (03/04/2024)   See flowsheets for full screening details  Depression Screen PHQ 2 & 9 Depression Scale- Over the past 2 weeks, how often have you been bothered by any of the following problems? Little interest or pleasure in doing things: 0 Feeling down, depressed, or hopeless (PHQ Adolescent also includes...irritable): 0 PHQ-2 Total Score: 0 Trouble falling or staying asleep, or sleeping too much: 2 (life in general) Feeling tired or having little energy: 0 Poor appetite or overeating (PHQ Adolescent also includes...weight loss): 0 Feeling bad about yourself - or that you are a failure or have let yourself or your family down: 0 Trouble concentrating on things, such as reading the newspaper or watching television (PHQ Adolescent also includes...like school work): 0 Moving or speaking so slowly that other people could have noticed. Or the opposite - being so fidgety or restless that you have been moving around a lot more than usual: 0 Thoughts that you would be  better off dead, or of hurting yourself in some way: 0 PHQ-9 Total Score: 2 If you checked off any problems, how difficult have these problems made it for you to do your work, take care of things at home, or get along with other people?: Not difficult at all  Depression Treatment Depression Interventions/Treatment : EYV7-0 Score <4 Follow-up Not Indicated     Goals Addressed               This Visit's Progress     Patient Stated (pt-stated)        Patient stated he plans to stay active             Objective:    Today's Vitals   03/04/24 1042  Weight: 193 lb (87.5 kg)  Height: 5' 6 (1.676 m)   Body mass index is 31.15 kg/m.  Hearing/Vision screen Hearing Screening - Comments:: Denies hearing difficulties   Vision Screening - Comments:: Wears rx glasses - up to date with routine eye exams with Dr Alvia Immunizations and Health Maintenance Health Maintenance  Topic Date Due   FOOT EXAM  Never done   Zoster Vaccines- Shingrix (1 of 2) 03/31/1974   COVID-19 Vaccine (4 - 2025-26 season) 11/26/2023   Influenza Vaccine  06/24/2024 (Originally 10/26/2023)   HEMOGLOBIN A1C  05/01/2024   Diabetic kidney evaluation - eGFR measurement  07/29/2024   Diabetic kidney evaluation - Urine ACR  07/29/2024   OPHTHALMOLOGY EXAM  11/29/2024   Medicare Annual Wellness (AWV)  03/04/2025   Colonoscopy  11/06/2029   DTaP/Tdap/Td (2 - Td or Tdap) 06/11/2031   Pneumococcal Vaccine: 50+ Years  Completed   Hepatitis C Screening  Completed   Meningococcal B Vaccine  Aged Out        Assessment/Plan:  This is a routine wellness examination for Stylianos.  Patient Care Team: Purcell Emil Schanz, MD as PCP - General (Internal Medicine) Mona Vinie BROCKS, MD as PCP - Cardiology (Cardiology) Mona Vinie BROCKS, MD as Consulting Physician (Cardiology) Alvia Norleen BIRCH, MD as Consulting Physician (Ophthalmology) Silva Juliene SAUNDERS, DPM as Consulting Physician (Podiatry) Tommas Pears, MD as  Referring Physician (Endocrinology)  I have personally reviewed and noted the following in the patient's chart:   Medical  and social history Use of alcohol, tobacco or illicit drugs  Current medications and supplements including opioid prescriptions. Functional ability and status Nutritional status Physical activity Advanced directives List of other physicians Hospitalizations, surgeries, and ER visits in previous 12 months Vitals Screenings to include cognitive, depression, and falls Referrals and appointments  No orders of the defined types were placed in this encounter.  In addition, I have reviewed and discussed with patient certain preventive protocols, quality metrics, and best practice recommendations. A written personalized care plan for preventive services as well as general preventive health recommendations were provided to patient.   Verdie CHRISTELLA Saba, CMA   03/04/2024   Return in 1 year (on 03/04/2025).  After Visit Summary: (MyChart) Due to this being a telephonic visit, the after visit summary with patients personalized plan was offered to patient via MyChart   Nurse Notes: scheduled 2026 AWV/CPE appts

## 2024-03-13 LAB — LAB REPORT - SCANNED
Albumin, Urine POC: 106.8
Creatinine, POC: 118.5 mg/dL
Microalb Creat Ratio: 90
PTH, Intact: 48

## 2024-03-14 ENCOUNTER — Encounter (INDEPENDENT_AMBULATORY_CARE_PROVIDER_SITE_OTHER): Admitting: Ophthalmology

## 2024-03-14 ENCOUNTER — Other Ambulatory Visit (HOSPITAL_COMMUNITY): Payer: Self-pay | Admitting: Nephrology

## 2024-03-14 DIAGNOSIS — N1832 Chronic kidney disease, stage 3b: Secondary | ICD-10-CM

## 2024-03-15 ENCOUNTER — Other Ambulatory Visit: Payer: Self-pay | Admitting: Emergency Medicine

## 2024-03-17 ENCOUNTER — Ambulatory Visit (HOSPITAL_COMMUNITY)
Admission: RE | Admit: 2024-03-17 | Discharge: 2024-03-17 | Disposition: A | Discharge status: Home / Self Care | Source: Ambulatory Visit | Attending: Nephrology | Admitting: Nephrology

## 2024-03-17 DIAGNOSIS — N1832 Chronic kidney disease, stage 3b: Secondary | ICD-10-CM | POA: Insufficient documentation

## 2024-03-21 ENCOUNTER — Other Ambulatory Visit: Payer: Self-pay | Admitting: Emergency Medicine

## 2024-03-21 ENCOUNTER — Encounter: Payer: Self-pay | Admitting: Emergency Medicine

## 2024-03-21 ENCOUNTER — Other Ambulatory Visit: Payer: Self-pay

## 2024-03-21 MED ORDER — OMEPRAZOLE 40 MG PO CPDR: 40.0000 mg | DELAYED_RELEASE_CAPSULE | Freq: Every day | ORAL | 0 refills | Status: DC

## 2024-04-08 ENCOUNTER — Other Ambulatory Visit: Payer: Self-pay | Admitting: Emergency Medicine

## 2024-04-08 DIAGNOSIS — F330 Major depressive disorder, recurrent, mild: Secondary | ICD-10-CM

## 2024-04-11 ENCOUNTER — Other Ambulatory Visit: Payer: Self-pay | Admitting: Emergency Medicine

## 2024-04-11 ENCOUNTER — Encounter (INDEPENDENT_AMBULATORY_CARE_PROVIDER_SITE_OTHER): Admitting: Ophthalmology

## 2024-04-11 DIAGNOSIS — I10 Essential (primary) hypertension: Secondary | ICD-10-CM

## 2024-04-11 DIAGNOSIS — H35033 Hypertensive retinopathy, bilateral: Secondary | ICD-10-CM

## 2024-04-11 DIAGNOSIS — E113513 Type 2 diabetes mellitus with proliferative diabetic retinopathy with macular edema, bilateral: Secondary | ICD-10-CM

## 2024-04-11 DIAGNOSIS — Z794 Long term (current) use of insulin: Secondary | ICD-10-CM

## 2024-04-12 ENCOUNTER — Other Ambulatory Visit: Payer: Self-pay | Admitting: Emergency Medicine

## 2024-04-17 NOTE — Progress Notes (Unsigned)
" ° °  [address] [place] [address] Dept: [phone]  FOLLOW UP NOTE  Patient ID: Joseph [name], male    DOB: [birth year]  Age: 69 y.o. MRN: [id number] Date of Office Visit: 04/18/2024  Assessment  Chief Complaint: No chief complaint on file.  HPI Joseph [name] is a 69 year old male who presents to the clinic for a follow-up visit.  He was last seen in this clinic on 01/16/2024 by Dr. Iva for evaluation of asthma and nonallergic rhinitis.  His last environmental allergy  skin testing was negative to the adult environmental panel on 01/16/2024.  Discussed the use of AI scribe software for clinical note transcription with the patient, who gave verbal consent to proceed.  History of Present Illness      Drug Allergies:  Allergies[1]  Physical Exam: There were no vitals taken for this visit.   Physical Exam  Diagnostics:    Assessment and Plan: No diagnosis found.  No orders of the defined types were placed in this encounter.   There are no Patient Instructions on file for this visit.  No follow-ups on file.    Thank you for the opportunity to care for this patient.  Please do not hesitate to contact me with questions.  Arlean Mutter, FNP [facility]          [1]  Allergies Allergen Reactions   Metformin Diarrhea and Other (See Comments)    diarrhea   Robaxin [Methocarbamol] Itching and Rash   "

## 2024-04-17 NOTE — Patient Instructions (Incomplete)
 Asthma Continue Trelegy 200-1 puff once a day to prevent cough or wheeze Continue albuterol  2 puffs once every 4 hours if needed for cough or wheeze You may use albuterol  2 puffs 5-15 minutes before activity to decrease cough or wheeze   Nonallergic rhinitis Consider saline nasal rinses as needed for nasal symptoms. Use this before any medicated nasal sprays for best result  Call the clinic if this treatment plan is not working well for you.  Follow up in *** or sooner if needed.

## 2024-04-18 ENCOUNTER — Ambulatory Visit: Admitting: Family Medicine

## 2024-04-27 ENCOUNTER — Other Ambulatory Visit: Payer: Self-pay | Admitting: Emergency Medicine

## 2024-05-01 ENCOUNTER — Encounter: Payer: Self-pay | Admitting: Emergency Medicine

## 2024-05-01 ENCOUNTER — Ambulatory Visit: Admitting: Emergency Medicine

## 2024-05-01 ENCOUNTER — Ambulatory Visit: Payer: Self-pay | Admitting: Emergency Medicine

## 2024-05-01 VITALS — BP 114/80 | HR 109 | Temp 98.0°F | Ht 66.0 in | Wt 180.0 lb

## 2024-05-01 DIAGNOSIS — E1159 Type 2 diabetes mellitus with other circulatory complications: Secondary | ICD-10-CM

## 2024-05-01 DIAGNOSIS — E1142 Type 2 diabetes mellitus with diabetic polyneuropathy: Secondary | ICD-10-CM

## 2024-05-01 DIAGNOSIS — R42 Dizziness and giddiness: Secondary | ICD-10-CM | POA: Insufficient documentation

## 2024-05-01 DIAGNOSIS — E1169 Type 2 diabetes mellitus with other specified complication: Secondary | ICD-10-CM

## 2024-05-01 DIAGNOSIS — E039 Hypothyroidism, unspecified: Secondary | ICD-10-CM

## 2024-05-01 DIAGNOSIS — N184 Chronic kidney disease, stage 4 (severe): Secondary | ICD-10-CM | POA: Insufficient documentation

## 2024-05-01 DIAGNOSIS — I739 Peripheral vascular disease, unspecified: Secondary | ICD-10-CM

## 2024-05-01 DIAGNOSIS — I2510 Atherosclerotic heart disease of native coronary artery without angina pectoris: Secondary | ICD-10-CM

## 2024-05-01 LAB — URINALYSIS
Bilirubin Urine: NEGATIVE
Hgb urine dipstick: NEGATIVE
Ketones, ur: NEGATIVE
Leukocytes,Ua: NEGATIVE
Nitrite: NEGATIVE
Specific Gravity, Urine: 1.015 (ref 1.000–1.030)
Total Protein, Urine: NEGATIVE
Urine Glucose: >=1000 — AB
Urobilinogen, UA: 1 (ref 0.0–1.0)
pH: 6 (ref 5.0–8.0)

## 2024-05-01 LAB — COMPREHENSIVE METABOLIC PANEL WITH GFR
ALT: 38 U/L (ref 3–53)
AST: 44 U/L — ABNORMAL HIGH (ref 5–37)
Albumin: 4.1 g/dL (ref 3.5–5.2)
Alkaline Phosphatase: 24 U/L — ABNORMAL LOW (ref 39–117)
BUN: 28 mg/dL — ABNORMAL HIGH (ref 6–23)
CO2: 28 meq/L (ref 19–32)
Calcium: 9.2 mg/dL (ref 8.4–10.5)
Chloride: 102 meq/L (ref 96–112)
Creatinine, Ser: 2.2 mg/dL — ABNORMAL HIGH (ref 0.40–1.50)
GFR: 29.9 mL/min — ABNORMAL LOW
Glucose, Bld: 196 mg/dL — ABNORMAL HIGH (ref 70–99)
Potassium: 4.4 meq/L (ref 3.5–5.1)
Sodium: 137 meq/L (ref 135–145)
Total Bilirubin: 0.6 mg/dL (ref 0.2–1.2)
Total Protein: 7.1 g/dL (ref 6.0–8.3)

## 2024-05-01 LAB — VITAMIN B12: Vitamin B-12: >1500 pg/mL — ABNORMAL HIGH (ref 211–911)

## 2024-05-01 LAB — CBC WITH DIFFERENTIAL/PLATELET
Basophils Absolute: 0 10*3/uL (ref 0.0–0.1)
Basophils Relative: 0.4 % (ref 0.0–3.0)
Eosinophils Absolute: 0.1 10*3/uL (ref 0.0–0.7)
Eosinophils Relative: 1.7 % (ref 0.0–5.0)
HCT: 38.7 % — ABNORMAL LOW (ref 39.0–52.0)
Hemoglobin: 12.9 g/dL — ABNORMAL LOW (ref 13.0–17.0)
Lymphocytes Relative: 23.1 % (ref 12.0–46.0)
Lymphs Abs: 1.5 10*3/uL (ref 0.7–4.0)
MCHC: 33.3 g/dL (ref 30.0–36.0)
MCV: 85.8 fl (ref 78.0–100.0)
Monocytes Absolute: 0.5 10*3/uL (ref 0.1–1.0)
Monocytes Relative: 8.3 % (ref 3.0–12.0)
Neutro Abs: 4.4 10*3/uL (ref 1.4–7.7)
Neutrophils Relative %: 66.5 % (ref 43.0–77.0)
Platelets: 268 10*3/uL (ref 150.0–400.0)
RBC: 4.51 Mil/uL (ref 4.22–5.81)
RDW: 14.6 % (ref 11.5–15.5)
WBC: 6.7 10*3/uL (ref 4.0–10.5)

## 2024-05-01 LAB — TSH: TSH: 6.98 u[IU]/mL — ABNORMAL HIGH (ref 0.35–5.50)

## 2024-05-01 MED ORDER — LEVOTHYROXINE SODIUM 50 MCG PO TABS: 50.0000 ug | ORAL_TABLET | Freq: Every day | ORAL | 3 refills | Status: AC

## 2024-05-01 NOTE — Assessment & Plan Note (Signed)
 Stable chronic conditions Continues pravastatin  40 mg daily and fenofibrate  160 mg daily

## 2024-05-01 NOTE — Assessment & Plan Note (Signed)
Stable Continue Cymbalta 60mg daily

## 2024-05-01 NOTE — Assessment & Plan Note (Signed)
Stable chronic condition.  No complications

## 2024-05-01 NOTE — Progress Notes (Unsigned)
 Joseph [name] 69 y.o.   Chief Complaint  Patient presents with   Follow-up    Pt states that he has been having dizzy spells and not sure if it is due to blood pressure is low or high and also his blood sugars has dropped    HISTORY OF PRESENT ILLNESS: This is a 69 y.o. male here for 18-month follow-up of multiple chronic medical conditions Has occasional dizzy spells.  Not sure if it is blood pressure or blood sugar related Sees endocrinologist on a regular basis.  Insulin -dependent diabetic also on other diabetic medications No other complaints or medical concerns today.  HPI   Prior to Admission medications  Medication Sig Start Date End Date Taking? Authorizing Provider  albuterol  (VENTOLIN  HFA) 108 (90 Base) MCG/ACT inhaler Inhale 2 puffs into the lungs every 4 (four) hours as needed for wheezing or shortness of breath. 01/02/24  Yes Iva Marty Saltness, MD  amLODipine  (NORVASC ) 5 MG tablet Take 1 tablet (5 mg total) by mouth daily. 07/30/23  Yes Elliott Lasecki, Emil Schanz, MD  Ascorbic Acid (VITAMIN C PO) Take by mouth.   Yes [provider]  aspirin  EC 81 MG tablet Take 81 mg by mouth daily.   Yes [provider]  B-D UF III MINI PEN NEEDLES 31G X 5 MM MISC Inject into the skin 3 (three) times daily. 12/06/20  Yes [provider]  B-D UF III MINI PEN NEEDLES 31G X 5 MM MISC CHECK THREE TIMES DAILY DX E11.9 12/22/21  Yes Braylea Brancato, Emil Schanz, MD  busPIRone  (BUSPAR ) 5 MG tablet Take 1 tablet (5 mg total) by mouth 2 (two) times daily as needed. 04/23/23  Yes Jassmin Kemmerer, Emil Schanz, MD  Cholecalciferol  (VITAMIN D3) 25 MCG (1000 UT) CAPS Take 1 capsule (1,000 Units total) by mouth daily. Follow-up appt due w/ Dr. Purcell must see provider for future refills 07/27/22  Yes Bao Coreas, Emil Schanz, MD  cyclobenzaprine  (FLEXERIL ) 10 MG tablet Take 1 tablet (10 mg total) by mouth 3 (three) times daily as needed. for muscle spams 12/28/23  Yes Zehr, Jessica D, PA-C   DULoxetine  (CYMBALTA ) 60 MG capsule Take 1 capsule by mouth once daily 04/08/24  Yes Epifania Littrell [name], MD  ergocalciferol (VITAMIN D2) 1.25 MG (50000 UT) capsule Take 50,000 Units by mouth every Wednesday. 04/09/20  Yes [provider]  famotidine  (PEPCID ) 40 MG tablet Take 1 tablet (40 mg total) by mouth daily. 01/16/24  Yes Jamir Rone, Emil Schanz, MD  FARXIGA 10 MG TABS tablet Take 10 mg by mouth daily. 12/14/20  Yes [provider]  fenofibrate  160 MG tablet Take 1 tablet by mouth once daily 04/13/24  Yes Detron Carras, Emil Schanz, MD  ferrous sulfate  325 (65 FE) MG tablet Take 325 mg by mouth daily with breakfast.   Yes [provider]  Finerenone  (KERENDIA ) 10 MG TABS Take 1 tablet (10 mg total) by mouth daily. 07/31/23  Yes Ravan Schlemmer, Emil Schanz, MD  fluticasone  (FLONASE ) 50 MCG/ACT nasal spray Place 2 sprays into both nostrils daily. 11/06/23  Yes Blair, Diane W, FNP  furosemide  (LASIX ) 20 MG tablet TAKE 1 TABLET (20 MG TOTAL) BY MOUTH DAILY AS NEEDED FOR EDEMA. 11/06/22  Yes [name], Vinie BROCKS, MD  glucose blood (ONETOUCH VERIO) test strip CHECK SUGARS 4 TIMES A DAY DX E11.9 11/26/20  Yes [provider]  insulin  aspart (NOVOLOG  FLEXPEN) 100 UNIT/ML FlexPen INJECT 3 TIMES A DAY PER SLIDING SCALE UP TO 30 UNITS A DAY DX E11.9 05/09/22  Yes Meiko Ives [name], MD  insulin  degludec (TRESIBA  FLEXTOUCH) 100 UNIT/ML FlexTouch Pen Inject 26 Units into the skin daily. 10/09/22  Yes Synia Douglass, Emil Schanz, MD  Insulin  Pen Needle (B-D UF III MINI PEN NEEDLES) 31G X 5 MM MISC CHECK THREE TIMES DAILY DX E11.9 05/10/20  Yes [provider]  Insulin  Pen Needle (B-D UF III MINI PEN NEEDLES) 31G X 5 MM MISC CHECK THREE TIMES DAILY DX E11.9 05/10/20  Yes [provider]  Insulin  Syringe-Needle U-100 31G X 5/16 0.3 ML MISC CHECK THREE TIMES DAILY DX E11.9 05/10/20  Yes [provider]  Lancets (ONETOUCH DELICA PLUS LANCET33G) MISC Apply topically. 12/10/20  Yes  [provider]  levocetirizine (XYZAL ) 5 MG tablet TAKE 1 TABLET BY MOUTH ONCE DAILY IN THE EVENING 01/07/24  Yes Norma Montemurro, Emil Schanz, MD  metoCLOPramide  (REGLAN ) 10 MG tablet Take 1 tablet (10 mg total) by mouth every 6 (six) hours as needed. for nausea 04/12/22  Yes Anaira Seay, Emil Schanz, MD  metoprolol  tartrate (LOPRESSOR ) 25 MG tablet Take 1 tablet by mouth twice daily 03/15/24  Yes Naja Apperson [name], MD  nitroGLYCERIN  (NITROSTAT ) 0.4 MG SL tablet Place 1 tablet (0.4 mg total) under the tongue every 5 (five) minutes as needed for chest pain. 08/10/20  Yes [name], Vinie BROCKS, MD  omeprazole  (PRILOSEC) 40 MG capsule Take 1 capsule by mouth once daily 04/28/24  Yes Avielle Imbert, [place], MD  [facility] VERIO test strip 4 (four) times daily. 11/26/20  Yes [provider]  pravastatin  (PRAVACHOL ) 40 MG tablet Take 1 tablet (40 mg total) by mouth daily. 02/07/24  Yes [name], Vinie BROCKS, MD  valsartan -hydrochlorothiazide  (DIOVAN -HCT) 160-12.5 MG tablet Take 1 tablet by mouth daily. 07/30/23  Yes Purcell Emil Schanz, MD    Allergies[1]  Patient Active Problem List   Diagnosis Date Noted   Chronic abdominal pain 12/28/2023   Stage 3a chronic kidney disease (HCC) 08/16/2022   Sleep apnea 04/20/2022   Situational mixed anxiety and depressive disorder 03/14/2022   Dyslipidemia associated with type 2 diabetes mellitus (HCC) 08/03/2021   Depression 02/01/2021   Gastro-esophageal reflux disease with esophagitis 02/01/2021   Gastric polyps - hyperplastic 03/19/2019   Diabetic autonomic neuropathy (HCC) 03/19/2019   Benign prostatic hyperplasia 01/16/2019   Chronic low back pain 01/16/2019   Iron deficiency anemia 01/16/2019   Dyslipidemia 03/10/2016   Atherosclerotic heart disease of native coronary artery without angina pectoris 05/03/2015   Diabetic peripheral neuropathy associated with type 2 diabetes mellitus (HCC) 05/03/2015   Enlarged prostate without lower urinary tract symptoms  (luts) 04/30/2015   Major depressive disorder, recurrent, mild 04/30/2015   Peripheral vascular disease 04/30/2015   Hx of adenomatous colonic polyps 06/11/2014   Hyperlipidemia 02/25/2014   Gastroparesis 02/17/2014   Neuropathy 12/03/2013   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   Presence of aortocoronary bypass graft 02/11/2012   Coronary arteriography abnormal 02/06/2012   Type 1 diabetes mellitus with complication (HCC) 07/27/2007   Hypertension associated with diabetes (HCC) 07/27/2007   GERD 07/27/2007   ARTHRITIS 07/27/2007   NEPHROLITHIASIS, HX OF 07/27/2007    Past Medical History:  Diagnosis Date   Anemia    Anxiety    Arthritis    Asthma    BPH (benign prostatic hypertrophy)    Cataract    Chronic renal insufficiency, stage III (moderate)    [place] [facility]   Coronary artery disease    Dr Mona    Depression    Diabetes mellitus without complication (HCC)  1988   insulin  dependent   Diabetic autonomic neuropathy (HCC) 03/19/2019   Gastric polyps - hyperplastic 03/19/2019   Gastroparesis due to DM [facility])    GERD (gastroesophageal reflux disease)    Hx of adenomatous colonic polyps 06/11/2014   Hyperlipidemia    Hypertension    Myocardial infarction [facility])    Neuropathy    PVD (peripheral vascular disease)    Sleep apnea 2010   refuses to use cpap    Past Surgical History:  Procedure Laterality Date   ANKLE FRACTURE SURGERY Right    BIOPSY  02/08/2023   Procedure: BIOPSY;  Surgeon: Avram Lupita BRAVO, MD;  Location: [facility] END[facility]COPY;  Service: Gastroenterology;;   CARDIAC CATHETERIZATION  01/30/2012   This demonstrated proximal to mid LAD disease and disease of 2 large OM vessels concerning for surgical disease.   COLONOSCOPY     CORONARY ARTERY BYPASS GRAFT  02/06/2012   Procedure: CORONARY ARTERY BYPASS GRAFTING (CABG); 5- Vessel bypass a LIMA to the LAD, a saphenous vein to the to the diagonal , a saphenous vein to the distal OM, and sequential reverse saphenous vien graft  to the posterior descending and 2nd OM. Surgeon: Dallas KATHEE Jude, MD;  Location: [facility] OR;  Service: Open Heart Surgery;  Laterality: [name];   ESOPHAGOGASTRODUODENOSCOPY  2005   ESOPHAGOGASTRODUODENOSCOPY [name] 02/08/2023   Procedure: ESOPHAGOGASTRODUODENOSCOPY (EGD);  Surgeon: Avram Lupita BRAVO, MD;  Location: [facility];  Service: Gastroenterology;  Laterality: [name];   GASTRIC VARICES BANDING  02/08/2023   Procedure: GASTRIC POLYPS BANDING;  Surgeon: Avram Lupita BRAVO, MD;  Location: [facility];  Service: Gastroenterology;;   LEFT HEART CATHETERIZATION WITH CORONARY ANGIOGRAM [name] 01/30/2012   Procedure: LEFT HEART CATHETERIZATION WITH CORONARY ANGIOGRAM;  Surgeon: Vinie KYM Maxcy, MD;  Location: [facility] CATH LAB;  Service: Cardiovascular;  Laterality: [name];   teeth removal  08/16/2021   TOOTH EXTRACTION [name] 09/16/2021   Procedure: DENTAL RESTORATION/EXTRACTIONS;  Surgeon: Sheryle Hamilton, DMD;  Location: [facility] OR;  Service: Oral Surgery;  Laterality: [name];   UPPER GASTROINTESTINAL ENDOSCOPY      Social History   Socioeconomic History   Marital status: Widowed    Spouse name: Not on file   Number of children: 4   Years of education: Not on file   Highest education level: 5th grade  Occupational History   Occupation: disabled  Tobacco Use   Smoking status: Never   Smokeless tobacco: Never  Vaping Use   Vaping status: Never Used  Substance and Sexual Activity   Alcohol use: No   Drug use: No   Sexual activity: Not Currently  Other Topics Concern   Not on file  Social History Narrative   Widowed, 1 son and 4 daughters   Disabled and dual eligible Medicare and Medicaid   One caffeinated beverage daily   Social Drivers of Health   Tobacco Use: Low Risk (05/01/2024)   Patient History    Smoking Tobacco Use: Never    Smokeless Tobacco Use: Never    Passive Exposure: Not on file  Financial Resource Strain: Low Risk (10/30/2023)   Overall Financial Resource Strain (CARDIA)    Difficulty of Paying  Living Expenses: Not hard at all  Food Insecurity: No Food Insecurity (03/04/2024)   Epic    Worried About Radiation Protection Practitioner of Food in the Last Year: Never true    Ran Out of Food in the Last Year: Never true  Transportation Needs: No Transportation Needs (03/04/2024)   Epic    Lack of  Transportation (Medical): No    Lack of Transportation (Non-Medical): No  Physical Activity: Inactive (03/04/2024)   Exercise Vital Sign    Days of Exercise per Week: 0 days    Minutes of Exercise per Session: 0 min  Stress: Stress Concern Present (03/04/2024)   [employer] of Occupational Health - Occupational Stress Questionnaire    Feeling of Stress: To some extent  Social Connections: Socially Isolated (03/04/2024)   Social Connection and Isolation Panel    Frequency of Communication with Friends and Family: Three times a week    Frequency of Social Gatherings with Friends and Family: Three times a week    Attends Religious Services: Never    Active Member of Clubs or Organizations: No    Attends Banker Meetings: Never    Marital Status: Widowed  Intimate Partner Violence: Not At Risk (03/04/2024)   Epic    Fear of Current or Ex-Partner: No    Emotionally Abused: No    Physically Abused: No    Sexually Abused: No  Depression (PHQ2-9): Low Risk (03/04/2024)   Depression (PHQ2-9)    PHQ-2 Score: 2  Alcohol Screen: Low Risk (02/03/2022)   Alcohol Screen    Last Alcohol Screening Score (AUDIT): 0  Housing: Unknown (03/04/2024)   Epic    Unable to Pay for Housing in the Last Year: No    Number of Times Moved in the Last Year: Not on file    Homeless in the Last Year: No  Utilities: Not At Risk (03/04/2024)   Epic    Threatened with loss of utilities: No  Health Literacy: Adequate Health Literacy (03/04/2024)   B1300 Health Literacy    Frequency of need for help with medical instructions: Never    Family History  Problem Relation Age of Onset   Diabetes Father    Hypertension  Father    Cancer Sister        unknown   Colon cancer Neg Hx    Esophageal cancer Neg Hx    Rectal cancer Neg Hx    Stomach cancer Neg Hx    Pancreatic cancer Neg Hx    Sleep apnea Neg Hx      Review of Systems  Constitutional:  Positive for malaise/fatigue. Negative for chills and fever.  HENT: Negative.  Negative for congestion and sore throat.   Respiratory: Negative.  Negative for cough and shortness of breath.   Cardiovascular: Negative.  Negative for chest pain and palpitations.  Gastrointestinal:  Negative for abdominal pain, diarrhea, nausea and vomiting.  Genitourinary: Negative.  Negative for dysuria and hematuria.  Skin: Negative.  Negative for rash.  Neurological:  Positive for weakness. Negative for dizziness, sensory change, focal weakness, loss of consciousness and headaches.  All other systems reviewed and are negative.   Vitals:   05/01/24 1343  BP: 114/80  Pulse: (!) 109  Temp: 98 F (36.7 C)  SpO2: 97%    Physical Exam Vitals reviewed.  Constitutional:      Appearance: Normal appearance.  HENT:     Head: Normocephalic.     Mouth/Throat:     Mouth: Mucous membranes are moist.     Pharynx: Oropharynx is clear.  Eyes:     Extraocular Movements: Extraocular movements intact.     Pupils: Pupils are equal, round, and reactive to light.  Cardiovascular:     Rate and Rhythm: Normal rate and regular rhythm.     Heart sounds: Normal heart sounds.  Pulmonary:  Effort: Pulmonary effort is normal.     Breath sounds: Normal breath sounds.  Abdominal:     Palpations: Abdomen is soft.     Tenderness: There is no abdominal tenderness.  Musculoskeletal:     Cervical back: No tenderness.     Right lower leg: No edema.     Left lower leg: No edema.  Lymphadenopathy:     Cervical: No cervical adenopathy.  Skin:    General: Skin is warm and dry.     Capillary Refill: Capillary refill takes less than 2 seconds.  Neurological:     General: No focal  deficit present.     Mental Status: He is alert and oriented to person, place, and time.  Psychiatric:        Mood and Affect: Mood normal.        Behavior: Behavior normal.    Results for orders placed or performed in visit on 05/01/24 (from the past 24 hours)  Urinalysis     Status: Abnormal   Collection Time: 05/01/24  2:19 PM  Result Value Ref Range   Color, Urine YELLOW Yellow;Lt. Yellow;Straw;Dark Yellow;Amber;Green;Red;Brown   APPearance CLEAR Clear;Turbid;Slightly Cloudy;Cloudy   Specific Gravity, Urine 1.015 1.000 - 1.030   pH 6.0 5.0 - 8.0   Total Protein, Urine NEGATIVE Negative   Urine Glucose >=1000 (A) Negative   Ketones, ur NEGATIVE Negative   Bilirubin Urine NEGATIVE Negative   Hgb urine dipstick NEGATIVE Negative   Urobilinogen, UA 1.0 0.0 - 1.0   Leukocytes,Ua NEGATIVE Negative   Nitrite NEGATIVE Negative  Vitamin B12     Status: Abnormal   Collection Time: 05/01/24  2:19 PM  Result Value Ref Range   Vitamin B-12 >1500 (H) 211 - 911 pg/mL  TSH     Status: Abnormal   Collection Time: 05/01/24  2:19 PM  Result Value Ref Range   TSH 6.98 (H) 0.35 - 5.50 uIU/mL  Comprehensive metabolic panel with GFR     Status: Abnormal   Collection Time: 05/01/24  2:19 PM  Result Value Ref Range   Sodium 137 135 - 145 mEq/L   Potassium 4.4 3.5 - 5.1 mEq/L   Chloride 102 96 - 112 mEq/L   CO2 28 19 - 32 mEq/L   Glucose, Bld 196 (H) 70 - 99 mg/dL   BUN 28 (H) 6 - 23 mg/dL   Creatinine, Ser 7.79 (H) 0.40 - 1.50 mg/dL   Total Bilirubin 0.6 0.2 - 1.2 mg/dL   Alkaline Phosphatase 24 (L) 39 - 117 U/L   AST 44 (H) 5 - 37 U/L   ALT 38 3 - 53 U/L   Total Protein 7.1 6.0 - 8.3 g/dL   Albumin  4.1 3.5 - 5.2 g/dL   GFR 70.09 (L) >39.99 mL/min   Calcium 9.2 8.4 - 10.5 mg/dL  CBC with Differential/Platelet     Status: Abnormal   Collection Time: 05/01/24  2:19 PM  Result Value Ref Range   WBC 6.7 4.0 - 10.5 K/uL   RBC 4.51 4.22 - 5.81 Mil/uL   Hemoglobin 12.9 (L) 13.0 - 17.0 g/dL    HCT 61.2 (L) 60.9 - 52.0 %   MCV 85.8 78.0 - 100.0 fl   MCHC 33.3 30.0 - 36.0 g/dL   RDW 85.3 88.4 - 84.4 %   Platelets 268.0 150.0 - 400.0 K/uL   Neutrophils Relative % 66.5 43.0 - 77.0 %   Lymphocytes Relative 23.1 12.0 - 46.0 %   Monocytes Relative 8.3 3.0 -  12.0 %   Eosinophils Relative 1.7 0.0 - 5.0 %   Basophils Relative 0.4 0.0 - 3.0 %   Neutro Abs 4.4 1.4 - 7.7 K/uL   Lymphs Abs 1.5 0.7 - 4.0 K/uL   Monocytes Absolute 0.5 0.1 - 1.0 K/uL   Eosinophils Absolute 0.1 0.0 - 0.7 K/uL   Basophils Absolute 0.0 0.0 - 0.1 K/uL     ASSESSMENT & PLAN: A total of 44 minutes was spent with the patient and counseling/coordination of care regarding preparing for this visit, review of most recent office visit notes, review of multiple chronic medical conditions and their management, differential diagnosis of intermittent dizzy spells, worsening chronic kidney disease and need for follow-up with nephrologist, review of all medications, review of most recent bloodwork results, review of health maintenance items, education on nutrition, prognosis, documentation, and need for follow up.   Problem List Items Addressed This Visit       Cardiovascular and Mediastinum   Hypertension associated with diabetes (HCC)   BP Readings from Last 3 Encounters:  05/01/24 114/80  02/07/24 100/64  01/02/24 130/70   Lab Results  Component Value Date   HGBA1C 7.2 (A) 10/30/2023  Well-controlled hypertension Continue amlodipine  5 mg daily, and valsartan  HCT 160-12.5 mg daily.  Also continue metoprolol  25 mg twice a day Hemoglobin A1c is 7.2, slightly higher than before Diet and nutrition discussed Cardiovascular risks associated with uncontrolled diabetes discussed Sees endocrinologist on a regular basis Continues daily insulin , Tresiba  as instructed by endocrinologist Also taking weekly Mounjaro.  Takes daily Farxiga 10 mg       Relevant Orders   POCT HgB A1C   CBC with Differential/Platelet  (Completed)   Comprehensive metabolic panel with GFR (Completed)   TSH (Completed)   Vitamin B12 (Completed)   Urinalysis (Completed)   Ambulatory referral to Nephrology   Atherosclerotic heart disease of native coronary artery without angina pectoris   No recent anginal episodes Continues daily baby aspirin  and beta-blocker with metoprolol  tartrate 25 mg twice a day Continues pravastatin  40 mg daily      Peripheral vascular disease   Stable chronic condition. No complications         Endocrine   Diabetic peripheral neuropathy associated with type 2 diabetes mellitus (HCC)   Stable. Continue Cymbalta  60 mg daily       Dyslipidemia associated with type 2 diabetes mellitus (HCC)   Stable chronic conditions Continues pravastatin  40 mg daily and fenofibrate  160 mg daily        Genitourinary   Stage 4 chronic kidney disease (HCC)   Relevant Orders   Ambulatory referral to Nephrology     Other   Dizzy spells - Primary   Multifactorial.  Chronic medical conditions all contributing Clinically stable. Blood work today shows worsening chronic renal failure Needs nephrology evaluation.  Referral placed today No recent changes in medications Hypertension well-controlled May be having occasional hypoglycemic episodes. Scheduled to see endocrinologist soon. Diet and nutrition discussed.  Advised to stay well-hydrated. ED precautions given. Advised to contact the office if no better or worse during the next several days or weeks.      Relevant Orders   CBC with Differential/Platelet (Completed)   Comprehensive metabolic panel with GFR (Completed)   TSH (Completed)   Vitamin B12 (Completed)   Urinalysis (Completed)   Patient Instructions  Feeling Dizzy: What It Means Dizziness is a common problem. It makes you feel unsteady or light-headed. You may feel like you're about to faint.  Dizziness can lead to getting hurt if you stumble or fall. It's more common to feel dizzy if  you're an older adult. Many things can cause you to feel dizzy. These include: Medicines. Dehydration. This is when there's not enough water in your body. Illness. Follow these instructions at home: Eating and drinking  Drink enough fluid to keep your pee (urine) pale yellow. This helps keep you from getting dehydrated. Try to drink more clear fluids, such as water. Do not drink alcohol. Try to limit how much caffeine you take in. Try to limit how much salt, also called sodium, you take in. Activity Try not to make quick movements. Stand up slowly from sitting in a chair. Steady yourself until you feel okay. In the morning, first sit up on the side of the bed. When you feel okay, hold onto something and slowly stand up. Do this until you know that your balance is okay. If you need to stand in one place for a long time, move your legs often. Tighten and relax the muscles in your legs while you're standing. Do not drive or use machines if you feel dizzy. Avoid bending down if you feel dizzy. Place items in your home so you can reach them without leaning over. Lifestyle Do not smoke, vape, or use products with nicotine or tobacco in them. If you need help quitting, talk with your health care provider. Try to lower your stress level. You can do this by using methods like yoga or meditation. Talk with your provider if you need help. General instructions Watch your dizziness for any changes. Take your medicines only as told by your provider. Talk with your provider if you think you're dizzy because of a medicine you're taking. Tell a friend or a family member that you're feeling dizzy. If they spot any changes in your behavior, have them call your provider. Contact a health care provider if: Your dizziness doesn't go away, or you have new symptoms. Your dizziness gets worse. You feel like you may vomit. You have trouble hearing. You have a fever. You have neck pain or a stiff neck. You  fall or get hurt. Get help right away if: You vomit each time you eat or drink. You have watery poop and can't eat or drink. You have trouble talking, walking, swallowing, or using your arms, hands, or legs. You feel very weak. You're bleeding. You're not thinking clearly, or you have trouble forming sentences. A friend or family member may spot this. Your vision changes, or you get a very bad headache. These symptoms may be an emergency. Call 911 right away. Do not wait to see if the symptoms will go away. Do not drive yourself to the hospital. This information is not intended to replace advice given to you by your health care provider. Make sure you discuss any questions you have with your health care provider. Document Revised: 01/20/2024 Document Reviewed: 04/27/2022 Elsevier Patient Education  2025 Elsevier Inc.     Emil Schaumann, MD [facility]     [1]  Allergies Allergen Reactions   Metformin Diarrhea and Other (See Comments)    diarrhea   Robaxin [Methocarbamol] Itching and Rash

## 2024-05-01 NOTE — Assessment & Plan Note (Signed)
No recent anginal episodes Continues daily baby aspirin and beta-blocker with metoprolol tartrate 25 mg twice a day Continues pravastatin 40 mg daily

## 2024-05-01 NOTE — Patient Instructions (Signed)
 Feeling Dizzy: What It Means Dizziness is a common problem. It makes you feel unsteady or light-headed. You may feel like you're about to faint. Dizziness can lead to getting hurt if you stumble or fall. It's more common to feel dizzy if you're an older adult. Many things can cause you to feel dizzy. These include: Medicines. Dehydration. This is when there's not enough water in your body. Illness. Follow these instructions at home: Eating and drinking  Drink enough fluid to keep your pee (urine) pale yellow. This helps keep you from getting dehydrated. Try to drink more clear fluids, such as water. Do not drink alcohol. Try to limit how much caffeine you take in. Try to limit how much salt, also called sodium, you take in. Activity Try not to make quick movements. Stand up slowly from sitting in a chair. Steady yourself until you feel okay. In the morning, first sit up on the side of the bed. When you feel okay, hold onto something and slowly stand up. Do this until you know that your balance is okay. If you need to stand in one place for a long time, move your legs often. Tighten and relax the muscles in your legs while you're standing. Do not drive or use machines if you feel dizzy. Avoid bending down if you feel dizzy. Place items in your home so you can reach them without leaning over. Lifestyle Do not smoke, vape, or use products with nicotine or tobacco in them. If you need help quitting, talk with your health care provider. Try to lower your stress level. You can do this by using methods like yoga or meditation. Talk with your provider if you need help. General instructions Watch your dizziness for any changes. Take your medicines only as told by your provider. Talk with your provider if you think you're dizzy because of a medicine you're taking. Tell a friend or a family member that you're feeling dizzy. If they spot any changes in your behavior, have them call your  provider. Contact a health care provider if: Your dizziness doesn't go away, or you have new symptoms. Your dizziness gets worse. You feel like you may vomit. You have trouble hearing. You have a fever. You have neck pain or a stiff neck. You fall or get hurt. Get help right away if: You vomit each time you eat or drink. You have watery poop and can't eat or drink. You have trouble talking, walking, swallowing, or using your arms, hands, or legs. You feel very weak. You're bleeding. You're not thinking clearly, or you have trouble forming sentences. A friend or family member may spot this. Your vision changes, or you get a very bad headache. These symptoms may be an emergency. Call 911 right away. Do not wait to see if the symptoms will go away. Do not drive yourself to the hospital. This information is not intended to replace advice given to you by your health care provider. Make sure you discuss any questions you have with your health care provider. Document Revised: 01/20/2024 Document Reviewed: 04/27/2022 Elsevier Patient Education  2025 [employer].

## 2024-05-01 NOTE — Assessment & Plan Note (Signed)
 BP Readings from Last 3 Encounters:  05/01/24 114/80  02/07/24 100/64  01/02/24 130/70   Lab Results  Component Value Date   HGBA1C 7.2 (A) 10/30/2023  Well-controlled hypertension Continue amlodipine  5 mg daily, and valsartan  HCT 160-12.5 mg daily.  Also continue metoprolol  25 mg twice a day Hemoglobin A1c is 7.2, slightly higher than before Diet and nutrition discussed Cardiovascular risks associated with uncontrolled diabetes discussed Sees endocrinologist on a regular basis Continues daily insulin , Tresiba  as instructed by endocrinologist Also taking weekly Mounjaro.  Takes daily Farxiga 10 mg

## 2024-05-01 NOTE — Assessment & Plan Note (Signed)
 Multifactorial.  Chronic medical conditions all contributing Clinically stable. Blood work today shows worsening chronic renal failure Needs nephrology evaluation.  Referral placed today No recent changes in medications Hypertension well-controlled May be having occasional hypoglycemic episodes. Scheduled to see endocrinologist soon. Diet and nutrition discussed.  Advised to stay well-hydrated. ED precautions given. Advised to contact the office if no better or worse during the next several days or weeks.

## 2024-05-02 LAB — POCT GLYCOSYLATED HEMOGLOBIN (HGB A1C): HbA1c POC (<> result, manual entry): 7.3 %

## 2024-05-23 ENCOUNTER — Encounter (INDEPENDENT_AMBULATORY_CARE_PROVIDER_SITE_OTHER): Admitting: Ophthalmology

## 2025-03-05 ENCOUNTER — Encounter: Admitting: Emergency Medicine

## 2025-03-05 ENCOUNTER — Ambulatory Visit
